# Patient Record
Sex: Female | Born: 1990
Health system: Southern US, Community
[De-identification: ages and names within clinical notes are randomized; demographics above are authoritative.]

## PROBLEM LIST (undated history)

## (undated) DIAGNOSIS — S83419A Sprain of medial collateral ligament of unspecified knee, initial encounter: Secondary | ICD-10-CM

## (undated) DIAGNOSIS — O139 Gestational [pregnancy-induced] hypertension without significant proteinuria, unspecified trimester: Secondary | ICD-10-CM

## (undated) DIAGNOSIS — W57XXXA Bitten or stung by nonvenomous insect and other nonvenomous arthropods, initial encounter: Secondary | ICD-10-CM

## (undated) DIAGNOSIS — K219 Gastro-esophageal reflux disease without esophagitis: Secondary | ICD-10-CM

## (undated) DIAGNOSIS — T7840XA Allergy, unspecified, initial encounter: Secondary | ICD-10-CM

## (undated) DIAGNOSIS — M2550 Pain in unspecified joint: Secondary | ICD-10-CM

## (undated) DIAGNOSIS — Q796 Ehlers-Danlos syndrome, unspecified: Secondary | ICD-10-CM

## (undated) DIAGNOSIS — E282 Polycystic ovarian syndrome: Secondary | ICD-10-CM

## (undated) DIAGNOSIS — M2341 Loose body in knee, right knee: Secondary | ICD-10-CM

## (undated) DIAGNOSIS — J45909 Unspecified asthma, uncomplicated: Secondary | ICD-10-CM

## (undated) DIAGNOSIS — M549 Dorsalgia, unspecified: Secondary | ICD-10-CM

## (undated) DIAGNOSIS — K829 Disease of gallbladder, unspecified: Secondary | ICD-10-CM

## (undated) DIAGNOSIS — R0602 Shortness of breath: Secondary | ICD-10-CM

## (undated) DIAGNOSIS — J411 Mucopurulent chronic bronchitis: Secondary | ICD-10-CM

## (undated) DIAGNOSIS — E739 Lactose intolerance, unspecified: Secondary | ICD-10-CM

## (undated) DIAGNOSIS — H6693 Otitis media, unspecified, bilateral: Secondary | ICD-10-CM

## (undated) DIAGNOSIS — K589 Irritable bowel syndrome without diarrhea: Secondary | ICD-10-CM

## (undated) HISTORY — DX: Gastro-esophageal reflux disease without esophagitis: K21.9

## (undated) HISTORY — DX: Ehlers-Danlos syndrome, unspecified: Q79.60

## (undated) HISTORY — DX: Allergy, unspecified, initial encounter: T78.40XA

## (undated) HISTORY — DX: Lactose intolerance, unspecified: E73.9

## (undated) HISTORY — DX: Pain in unspecified joint: M25.50

## (undated) HISTORY — DX: Disease of gallbladder, unspecified: K82.9

## (undated) HISTORY — DX: Shortness of breath: R06.02

## (undated) HISTORY — DX: Polycystic ovarian syndrome: E28.2

## (undated) HISTORY — DX: Dorsalgia, unspecified: M54.9

## (undated) HISTORY — DX: Irritable bowel syndrome, unspecified: K58.9

## (undated) HISTORY — PX: TYMPANOSTOMY TUBE PLACEMENT: SHX32

## (undated) HISTORY — DX: Unspecified asthma, uncomplicated: J45.909

---
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
## 2011-02-03 ENCOUNTER — Inpatient Hospital Stay (INDEPENDENT_AMBULATORY_CARE_PROVIDER_SITE_OTHER)
Admission: RE | Admit: 2011-02-03 | Discharge: 2011-02-03 | Disposition: A | Discharge status: Home / Self Care | Payer: 59 | Source: Ambulatory Visit | Attending: Emergency Medicine | Admitting: Emergency Medicine

## 2011-02-03 DIAGNOSIS — K219 Gastro-esophageal reflux disease without esophagitis: Secondary | ICD-10-CM

## 2011-03-18 ENCOUNTER — Encounter (INDEPENDENT_AMBULATORY_CARE_PROVIDER_SITE_OTHER): Payer: Self-pay

## 2011-04-03 ENCOUNTER — Emergency Department (HOSPITAL_COMMUNITY): Payer: 59

## 2011-04-03 ENCOUNTER — Inpatient Hospital Stay (INDEPENDENT_AMBULATORY_CARE_PROVIDER_SITE_OTHER)
Admission: RE | Admit: 2011-04-03 | Discharge: 2011-04-03 | Disposition: A | Discharge status: Home / Self Care | Payer: 59 | Source: Ambulatory Visit | Attending: Family Medicine | Admitting: Family Medicine

## 2011-04-03 ENCOUNTER — Emergency Department (HOSPITAL_COMMUNITY)
Admission: EM | Admit: 2011-04-03 | Discharge: 2011-04-04 | Disposition: A | Discharge status: Home / Self Care | Payer: 59 | Attending: Emergency Medicine | Admitting: Emergency Medicine

## 2011-04-03 ENCOUNTER — Ambulatory Visit (INDEPENDENT_AMBULATORY_CARE_PROVIDER_SITE_OTHER): Payer: 59

## 2011-04-03 DIAGNOSIS — IMO0002 Reserved for concepts with insufficient information to code with codable children: Secondary | ICD-10-CM | POA: Insufficient documentation

## 2011-04-03 DIAGNOSIS — Y92410 Unspecified street and highway as the place of occurrence of the external cause: Secondary | ICD-10-CM | POA: Insufficient documentation

## 2011-04-03 DIAGNOSIS — Z79899 Other long term (current) drug therapy: Secondary | ICD-10-CM | POA: Insufficient documentation

## 2011-04-03 DIAGNOSIS — S7000XA Contusion of unspecified hip, initial encounter: Secondary | ICD-10-CM | POA: Insufficient documentation

## 2011-04-03 DIAGNOSIS — K219 Gastro-esophageal reflux disease without esophagitis: Secondary | ICD-10-CM | POA: Insufficient documentation

## 2011-04-03 DIAGNOSIS — J029 Acute pharyngitis, unspecified: Secondary | ICD-10-CM

## 2011-04-03 DIAGNOSIS — M25559 Pain in unspecified hip: Secondary | ICD-10-CM | POA: Insufficient documentation

## 2011-04-03 DIAGNOSIS — M79609 Pain in unspecified limb: Secondary | ICD-10-CM | POA: Insufficient documentation

## 2011-04-03 LAB — URINALYSIS, ROUTINE W REFLEX MICROSCOPIC
Bilirubin Urine: NEGATIVE
Glucose, UA: NEGATIVE mg/dL
Ketones, ur: 15 mg/dL — AB
Leukocytes, UA: NEGATIVE
Nitrite: NEGATIVE
Protein, ur: NEGATIVE mg/dL
Specific Gravity, Urine: 1.008 (ref 1.005–1.030)
Urobilinogen, UA: 0.2 mg/dL (ref 0.0–1.0)
pH: 6.5 (ref 5.0–8.0)

## 2011-04-03 LAB — URINE MICROSCOPIC-ADD ON

## 2011-04-03 LAB — POCT PREGNANCY, URINE: Preg Test, Ur: NEGATIVE

## 2011-04-08 ENCOUNTER — Ambulatory Visit (INDEPENDENT_AMBULATORY_CARE_PROVIDER_SITE_OTHER): Payer: 59 | Admitting: Internal Medicine

## 2011-04-10 ENCOUNTER — Ambulatory Visit (INDEPENDENT_AMBULATORY_CARE_PROVIDER_SITE_OTHER): Payer: 59 | Admitting: Internal Medicine

## 2011-04-10 ENCOUNTER — Encounter (INDEPENDENT_AMBULATORY_CARE_PROVIDER_SITE_OTHER): Payer: Self-pay | Admitting: Internal Medicine

## 2011-04-10 ENCOUNTER — Telehealth (INDEPENDENT_AMBULATORY_CARE_PROVIDER_SITE_OTHER): Payer: Self-pay | Admitting: *Deleted

## 2011-04-10 VITALS — BP 104/68 | HR 72 | Temp 98.0°F | Ht 70.0 in | Wt 148.0 lb

## 2011-04-10 DIAGNOSIS — K219 Gastro-esophageal reflux disease without esophagitis: Secondary | ICD-10-CM

## 2011-04-10 NOTE — Progress Notes (Signed)
Subjective:     Patient ID: Tammy Chapman, female   DOB: 04/29/1991, 20 y.o.   MRN: [id number]  HPI   Referral from Dr. Margo Aye  For acid reflux.  She says her stomach does not like spicy foods or greasy foods.  Even bland foods will bother her.  She has has epigastric pain and chest pain since June. She  saw Dr. Margo Aye in June and was given an Rx for  Carafate and Dexilant which has helped somewhat.   She continues to have symptoms.  Symptoms have been worse since her gallbladder was removed 3 yrs ago.  She underwent cholecystectomy for biliary dyskinesia by Dr. Carolynne Edouard III. Her  appetite is good.  No weight loss. No dysphagia. She has frequent diarrhea since her gallbladder surgery.   Sometimes after she eats, she will have to have a BM. No rectal bleeding or melena.   Father has Celiac disease.  LMP 03/22/2011. Not sexual active  Past Surgical History  Procedure Date  . Cholecystectomy 01/20/08    PAUL TOTH  . Myringotomy     Tubes in ears   Past Medical History  Diagnosis Date  . GERD (gastroesophageal reflux disease)   . Chest pain   . Dizziness   . Anxiety    Current outpatient prescriptions:dexlansoprazole (DEXILANT) 60 MG capsule, Take 60 mg by mouth daily.  , Disp: , Rfl: ;  sucralfate (CARAFATE) 1 GM/10ML suspension, Take 1 g by mouth 4 (four) times daily.  , Disp: , Rfl:   Family HIstory: Mother and father alive in good health. Father has Celiac disease. One brother in good health. 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 She does not smoke, drink, or do drugs.  .    Review of Systems see hpi     Objective:   Physical Exam     Blood pressure 104/68, pulse 72, temperature 98 F (36.7 C), height 5\' 10"  (1.778 m), weight 148 lb (67.132 kg), last menstrual period 03/22/2011.  Alert and oriented. Skin warm and dry. Oral mucosa is moist. Natural teeth in good condition. Sclera anicteric, conjunctivae is pink. Thyroid not enlarged. No  cervical lymphadenopathy. Lungs clear. Heart regular rate and rhythm.  Abdomen is soft. Bowel sounds are positive. No hepatomegaly. No abdominal masses felt. No tenderness.  No edema to lower extremities. Patient is alert and oriented.     Assessment:     Susa Loffler to [employer] and Carafate.  PUD/ esophagitis needs to be ruled out.    Plan:    EGD..  Mother would also like her daughter to be test for Celiac. The risks and benefits such as perforation, bleeding, and infection were reviewed with the patient and is agreeable.

## 2011-04-10 NOTE — Telephone Encounter (Signed)
EGD sch'd 05/15/11 @ 3:15 (2:15), instructions given to pt

## 2011-05-14 LAB — CBC
HCT: 41.4
Hemoglobin: 14.1
MCHC: 34.2
MCV: 84
Platelets: 218
RBC: 4.92
RDW: 13
WBC: 7.1

## 2011-05-14 MED ORDER — SODIUM CHLORIDE 0.45 % IV SOLN
Freq: Once | INTRAVENOUS | Status: AC
  Administered 2011-05-15: 14:00:00 via INTRAVENOUS

## 2011-05-15 ENCOUNTER — Ambulatory Visit (HOSPITAL_COMMUNITY)
Admission: RE | Admit: 2011-05-15 | Discharge: 2011-05-15 | Disposition: A | Discharge status: Home / Self Care | Payer: 59 | Source: Ambulatory Visit | Attending: Internal Medicine | Admitting: Internal Medicine

## 2011-05-15 ENCOUNTER — Encounter (HOSPITAL_COMMUNITY)
Admission: RE | Disposition: A | Discharge status: Home / Self Care | Payer: Self-pay | Source: Ambulatory Visit | Attending: Internal Medicine

## 2011-05-15 ENCOUNTER — Other Ambulatory Visit (INDEPENDENT_AMBULATORY_CARE_PROVIDER_SITE_OTHER): Payer: Self-pay | Admitting: Internal Medicine

## 2011-05-15 ENCOUNTER — Encounter (HOSPITAL_COMMUNITY): Payer: Self-pay

## 2011-05-15 DIAGNOSIS — R1013 Epigastric pain: Secondary | ICD-10-CM | POA: Insufficient documentation

## 2011-05-15 DIAGNOSIS — K2940 Chronic atrophic gastritis without bleeding: Secondary | ICD-10-CM | POA: Insufficient documentation

## 2011-05-15 DIAGNOSIS — K2990 Gastroduodenitis, unspecified, without bleeding: Secondary | ICD-10-CM

## 2011-05-15 DIAGNOSIS — K2970 Gastritis, unspecified, without bleeding: Secondary | ICD-10-CM

## 2011-05-15 DIAGNOSIS — K219 Gastro-esophageal reflux disease without esophagitis: Secondary | ICD-10-CM

## 2011-05-15 HISTORY — PX: ESOPHAGOGASTRODUODENOSCOPY: SHX5428

## 2011-05-15 SURGERY — EGD (ESOPHAGOGASTRODUODENOSCOPY)
Anesthesia: Moderate Sedation

## 2011-05-15 MED ORDER — MEPERIDINE HCL 25 MG/ML IJ SOLN
INTRAMUSCULAR | Status: DC | PRN
  Administered 2011-05-15 (×2): 25 mg via INTRAVENOUS

## 2011-05-15 MED ORDER — DICYCLOMINE HCL 10 MG PO CAPS: 10.0000 mg | ORAL_CAPSULE | Freq: Two times a day (BID) | ORAL | Status: DC

## 2011-05-15 MED ORDER — MIDAZOLAM HCL 5 MG/5ML IJ SOLN
INTRAMUSCULAR | Status: DC | PRN
  Administered 2011-05-15: 1 mg via INTRAVENOUS
  Administered 2011-05-15 (×4): 2 mg via INTRAVENOUS

## 2011-05-15 MED ORDER — MIDAZOLAM HCL 5 MG/5ML IJ SOLN
INTRAMUSCULAR | Status: AC
  Filled 2011-05-15: qty 10

## 2011-05-15 MED ORDER — MEPERIDINE HCL 50 MG/ML IJ SOLN
INTRAMUSCULAR | Status: AC
  Filled 2011-05-15: qty 1

## 2011-05-15 MED ORDER — BUTAMBEN-TETRACAINE-BENZOCAINE 2-2-14 % EX AERO
INHALATION_SPRAY | CUTANEOUS | Status: DC | PRN
  Administered 2011-05-15: 2 via TOPICAL

## 2011-05-15 NOTE — H&P (Signed)
Tammy Chapman is an 20 y.o. female.   Chief Complaint: Patient is here for diagnostic esophagogastroduodenoscopy HPI: Patient is 21 year old female was at symptoms of GERD for 18-24 months uncontrolled current therapy continues to experience epigastric pain after some meals. She has found that she is intolerant of diary products and using lactated has not alleviated her symptoms. She has intermittent diarrhea. Her mother is concerned that she may have celiac disease because her biologic father had this condition. Tammy Chapman states that this is different pain that she experienced prior to having her gallbladder removed in June 2009. No history of melena rectal bleeding or weight loss. She does not use OTC NSAIDs. She also denies dysphagia  Past Medical History  Diagnosis Date  . GERD (gastroesophageal reflux disease)   . Chest pain   . Dizziness   . Anxiety     Past Surgical History  Procedure Date  . Cholecystectomy 01/20/08    PAUL TOTH  . Myringotomy     Tubes in ears  . Tonsillectomy     age 55    History reviewed. No pertinent family history. Social History:  reports that she has never smoked. She does not have any smokeless tobacco history on file. She reports that she does not drink alcohol or use illicit drugs.  Allergies:  Allergies  Allergen Reactions  . Benadryl (Altaryl) Shortness Of Breath  . Penicillins Shortness Of Breath and Rash  . Ultram (Tramadol Hcl) Hives  . Sulfa Antibiotics Rash    Medications Prior to Admission  Medication Dose Route Frequency Provider Last Rate Last Dose  . 0.45 % sodium chloride infusion   Intravenous Once Malissa Hippo, MD 20 mL/hr at 05/15/11 1429    . meperidine (DEMEROL) 50 MG/ML injection           . midazolam (VERSED) 5 MG/5ML injection            Medications Prior to Admission  Medication Sig Dispense Refill  . acetaminophen (TYLENOL) 500 MG tablet Take 1,000 mg by mouth every 6 (six) hours as needed. For pain       .  cetirizine (ZYRTEC) 10 MG tablet Take 10 mg by mouth daily.        Marland Kitchen dexlansoprazole (DEXILANT) 60 MG capsule Take 60 mg by mouth daily.        . sucralfate (CARAFATE) 1 GM/10ML suspension Take 1 g by mouth 4 (four) times daily.          No results found for this or any previous visit (from the past 48 hour(s)). No results found.  Review of Systems  Constitutional: Negative for weight loss.  Gastrointestinal: Negative for nausea, vomiting, constipation, blood in stool and melena. Heartburn:  well controlled with with dexilant; Nexium did not provide any relief. Abdominal pain: Intermittent postprandial epigastric pain. Diarrhea: occasional.    Blood pressure 123/88, pulse 102, temperature 98.2 F (36.8 C), temperature source Oral, resp. rate 15, height 5\' 10"  (1.778 m), weight 140 lb (63.504 kg), last menstrual period 05/15/2011, SpO2 100.00%. Physical Exam  Constitutional: She appears well-developed and well-nourished.  HENT:  Mouth/Throat: Oropharynx is clear and moist.  Eyes: Conjunctivae are normal.  Neck: No thyromegaly present.  Cardiovascular: Normal rate, regular rhythm and normal heart sounds.   No murmur heard. Respiratory: Effort normal and breath sounds normal.  GI: Soft. Bowel sounds are normal. She exhibits no distension and no mass. There is no tenderness.  Musculoskeletal: She exhibits no edema.  Lymphadenopathy:  She has no cervical adenopathy.  Neurological: She is alert.  Skin: Skin is warm and dry.     Assessment/Plan Recurrent epigastric pain not responding to PPI therapy. Patient is status post cholecystectomy were 2 years ago Family history of celiac disease Diagnostic esophagogastroduodenoscopy  Kvon Mcilhenny U 05/15/2011, 3:04 PM

## 2011-05-15 NOTE — Op Note (Signed)
EGD PROCEDURE REPORT  PATIENT:  Tammy Chapman  MR#:  [id number] Birthdate:  1991-01-08, 20 y.o., female Endoscopist:  Dr. Malissa Hippo, MD Referred By:  Dr. Dwana Melena, MD Procedure Date: 05/15/2011  Procedure:   EGD  Indications:  Patient is a 20 year old Caucasian female with a symptoms of GERD for 18-24 months now doing well with therapy she continues to experience postprandial epigastric pain. She is status post cholecystectomy over 2 years ago. He has intermittent diarrhea family history is positive for celiac disease in her father. She is undergoing diagnostic EGD to            Informed Consent: Procedure and risks were reviewed with the patient and informed consent was obtained. Medications:  Demerol 50 mg IV Versed 9 mg IV Cetacaine spray topically for oropharyngeal anesthesia  Description of procedure:  The endoscope was introduced through the mouth and advanced to the second portion of the duodenum without difficulty or limitations. The mucosal surfaces were surveyed very carefully during advancement of the scope and upon withdrawal.  Findings:  Esophagus:  Mucosa of the esophagus was normal. GEJ:  at 40 cm without ring or stricture. No hiatal hernia present Stomach:  Stomach was empty other than small amount of bile. There was patchy edema and erythema to  antral mucosa. No erosions or ulcers were identified. Angularis fundus and cardia were normal. Pyloric channel was patent. Antral biopsy taken for routine histology. Duodenum:  Normal bulbar and post bulbar mucosa. Random biopsies taken from second part of the duodenum mucosa looking for mucosal disease.  Therapeutic/Diagnostic Maneuvers Performed:  See above  Complications:  None  Impression: Nonerosive antral gastritis otherwise normal esophagogastroduodenoscopy. Biopsy taken from antral and duodenal mucosa  Recommendations:  Continue anti-reflex measures and  Dexilant at current dose. May discontinue  sucralfate. Dicyclomine 10 mg before breakfast and lunch by mouth. I will be contacting patient with results of biopsy and further recommendations   Natividad Halls U  05/15/2011  3:41 PM  CC: Dr. Dwana Melena, MD & Dr. Bonnetta Barry ref. provider found

## 2011-05-22 ENCOUNTER — Encounter (HOSPITAL_COMMUNITY): Payer: Self-pay | Admitting: Internal Medicine

## 2011-05-28 ENCOUNTER — Encounter (INDEPENDENT_AMBULATORY_CARE_PROVIDER_SITE_OTHER): Payer: Self-pay | Admitting: *Deleted

## 2011-08-04 ENCOUNTER — Encounter (INDEPENDENT_AMBULATORY_CARE_PROVIDER_SITE_OTHER): Payer: Self-pay | Admitting: Internal Medicine

## 2011-08-04 ENCOUNTER — Ambulatory Visit (INDEPENDENT_AMBULATORY_CARE_PROVIDER_SITE_OTHER): Payer: 59 | Admitting: Internal Medicine

## 2011-08-04 DIAGNOSIS — R109 Unspecified abdominal pain: Secondary | ICD-10-CM | POA: Insufficient documentation

## 2011-08-04 DIAGNOSIS — K219 Gastro-esophageal reflux disease without esophagitis: Secondary | ICD-10-CM

## 2011-08-04 DIAGNOSIS — R197 Diarrhea, unspecified: Secondary | ICD-10-CM

## 2011-08-04 DIAGNOSIS — K580 Irritable bowel syndrome with diarrhea: Secondary | ICD-10-CM | POA: Insufficient documentation

## 2011-08-04 MED ORDER — DICYCLOMINE HCL 10 MG PO CAPS: 20.0000 mg | ORAL_CAPSULE | Freq: Two times a day (BID) | ORAL | Status: DC

## 2011-08-04 NOTE — Progress Notes (Signed)
Presenting complaint; Follow-up for GERD, abdominal pain and diarrhea. Subjective: Tammy Chapman is a 20 year old Caucasian female who is here for scheduled visit accompanied by her mother Tammy Chapman. She does not feel 100% better. She has been on Dexilant, she has rarely experienced heartburn. With dicyclomine she has noted reduction in diarrhea episodes and less pain she still has some days when she had excruciating epigastric pain after meals at times she has pain across the lower abdomen. She also has noted recent frequency of vomiting. Last night she ate ice cream and experienced severe abdominal pain. Only side effect she is experienced with dicyclomine is dry eyes and she is able to take care of it by using eyedrops. Patient states her GI symptoms began after a visit to [place] when she was 20 years old but she got better and then symptoms relapse 2 years later. Cholecystectomy in June 2009 did not provide any relief. In 2010 she hadn't abdominopelvic CT when she had possibly ruptured ovarian cyst and no abnormality noted the pancreas or intestines. She had EGD on 05/15/2011 which revealed nonerosive antral gastritis. Gastric biopsy revealed reactive changes and some hyperemia but no evidence of H. pylori and due to the biopsy was negative for changes of celiac disease. She remains with good appetite but at times afraid to eat. He denies skin rash, wheezing, melena or rectal bleeding. She has gained 8 pounds in the last 4 months. Current Medications: Current Outpatient Prescriptions  Medication Sig Dispense Refill  . acetaminophen (TYLENOL) 500 MG tablet Take 1,000 mg by mouth every 6 (six) hours as needed. For pain       . cetirizine (ZYRTEC) 10 MG tablet Take 10 mg by mouth daily.        Marland Kitchen dexlansoprazole (DEXILANT) 60 MG capsule Take 60 mg by mouth daily.        Marland Kitchen dicyclomine (BENTYL) 10 MG capsule Take 1 capsule (10 mg total) by mouth 2 (two) times daily with breakfast and lunch.  60 capsule  3     Objective: BP 102/70  Pulse 76  Temp(Src) 98.1 F (36.7 C) (Oral)  Resp 14  Ht 5\' 10"  (1.778 m)  Wt 156 lb (70.761 kg)  BMI 22.38 kg/m2  LMP 07/19/2011  Conjunctiva is pink. Sclera is nonicteric Oral pharyngeal mucosa is normal. No neck masses or thyromegaly noted. Cardiac exam with regular rhythm normal S1 and S2. No murmur or gallop noted. Lungs are clear to auscultation. Abdomen with normal bowel sounds; it is soft and nontender without organomegaly or masses. No LE edema or clubbing noted.  Assessment: #1. Chronic GERD. She is doing well with therapy. .At some point Dexilant  dose will have to be reduced but not until all of her symptoms have resolved. #2. Abdominal pain and diarrhea. Most of her pain is postprandial and located in epigastric region but at times she has lower abdominal pain associated with nonbloody. these symptoms appear to be due to IBS but she has not responded well to therapy. Therefore we'll proceed with further workup.   Plan: Increase dicyclomine to 20 mg before breakfast and 20 mg before lunch or evening meal. She can drop second dose to 10 mg if she has any side effects. CBC with differential and comprehensive chemistry panel. Small bowel follow-through. Office visit in 3 months.

## 2011-08-04 NOTE — Patient Instructions (Signed)
Milligrams 30 minutes before breakfast and 20 mg before lunch or evening meal; can reduce second dose to 10 mg if you have dry mouth or dry eyes. Small bowel follow-through to be scheduled. Physician will contact you with results of blood work and small bowel x-ray

## 2011-08-05 LAB — COMPREHENSIVE METABOLIC PANEL
ALT: 14 U/L (ref 0–35)
AST: 11 U/L (ref 0–37)
Albumin: 4.3 g/dL (ref 3.5–5.2)
Alkaline Phosphatase: 66 U/L (ref 39–117)
BUN: 10 mg/dL (ref 6–23)
CO2: 26 mEq/L (ref 19–32)
Calcium: 9.2 mg/dL (ref 8.4–10.5)
Chloride: 104 mEq/L (ref 96–112)
Creat: 0.66 mg/dL (ref 0.50–1.10)
Glucose, Bld: 95 mg/dL (ref 70–99)
Potassium: 3.9 mEq/L (ref 3.5–5.3)
Sodium: 135 mEq/L (ref 135–145)
Total Bilirubin: 1.1 mg/dL (ref 0.3–1.2)
Total Protein: 6.9 g/dL (ref 6.0–8.3)

## 2011-08-06 LAB — CBC WITH DIFFERENTIAL/PLATELET
Basophils Absolute: 0 10*3/uL (ref 0.0–0.1)
Basophils Relative: 0 % (ref 0–1)
Eosinophils Absolute: 0.1 10*3/uL (ref 0.0–0.7)
Eosinophils Relative: 2 % (ref 0–5)
HCT: 43 % (ref 36.0–46.0)
Hemoglobin: 14.3 g/dL (ref 12.0–15.0)
Lymphocytes Relative: 48 % — ABNORMAL HIGH (ref 12–46)
Lymphs Abs: 2.7 10*3/uL (ref 0.7–4.0)
MCH: 28.8 pg (ref 26.0–34.0)
MCHC: 33.3 g/dL (ref 30.0–36.0)
MCV: 86.5 fL (ref 78.0–100.0)
Monocytes Absolute: 0.6 10*3/uL (ref 0.1–1.0)
Monocytes Relative: 11 % (ref 3–12)
Neutro Abs: 2.2 10*3/uL (ref 1.7–7.7)
Neutrophils Relative %: 39 % — ABNORMAL LOW (ref 43–77)
Platelets: 190 10*3/uL (ref 150–400)
RBC: 4.97 MIL/uL (ref 3.87–5.11)
RDW: 12.8 % (ref 11.5–15.5)
WBC: 5.6 10*3/uL (ref 4.0–10.5)

## 2011-08-07 ENCOUNTER — Ambulatory Visit (HOSPITAL_COMMUNITY)
Admission: RE | Admit: 2011-08-07 | Discharge: 2011-08-07 | Disposition: A | Discharge status: Home / Self Care | Payer: 59 | Source: Ambulatory Visit | Attending: Internal Medicine | Admitting: Internal Medicine

## 2011-08-07 DIAGNOSIS — R109 Unspecified abdominal pain: Secondary | ICD-10-CM | POA: Insufficient documentation

## 2011-08-07 DIAGNOSIS — R112 Nausea with vomiting, unspecified: Secondary | ICD-10-CM | POA: Insufficient documentation

## 2011-08-07 DIAGNOSIS — R197 Diarrhea, unspecified: Secondary | ICD-10-CM | POA: Insufficient documentation

## 2011-08-13 ENCOUNTER — Telehealth (INDEPENDENT_AMBULATORY_CARE_PROVIDER_SITE_OTHER): Payer: Self-pay | Admitting: *Deleted

## 2011-08-13 DIAGNOSIS — R197 Diarrhea, unspecified: Secondary | ICD-10-CM

## 2011-08-13 DIAGNOSIS — R109 Unspecified abdominal pain: Secondary | ICD-10-CM

## 2011-08-13 NOTE — Telephone Encounter (Signed)
Per Dr. Karilyn Cota the patient will need CBC/Diff in 1 month with a smear by Pathologist.

## 2011-08-14 ENCOUNTER — Encounter (INDEPENDENT_AMBULATORY_CARE_PROVIDER_SITE_OTHER): Payer: Self-pay | Admitting: *Deleted

## 2011-08-20 ENCOUNTER — Telehealth (INDEPENDENT_AMBULATORY_CARE_PROVIDER_SITE_OTHER): Payer: Self-pay | Admitting: *Deleted

## 2011-08-20 DIAGNOSIS — D649 Anemia, unspecified: Secondary | ICD-10-CM

## 2011-08-20 NOTE — Telephone Encounter (Signed)
Smear by pathologist

## 2011-09-02 ENCOUNTER — Other Ambulatory Visit (INDEPENDENT_AMBULATORY_CARE_PROVIDER_SITE_OTHER): Payer: Self-pay | Admitting: Internal Medicine

## 2011-09-03 LAB — CBC WITH DIFFERENTIAL/PLATELET
Basophils Absolute: 0 10*3/uL (ref 0.0–0.1)
Basophils Relative: 0 % (ref 0–1)
Eosinophils Absolute: 0.1 10*3/uL (ref 0.0–0.7)
Eosinophils Relative: 2 % (ref 0–5)
HCT: 46.5 % — ABNORMAL HIGH (ref 36.0–46.0)
Hemoglobin: 15.5 g/dL — ABNORMAL HIGH (ref 12.0–15.0)
Lymphocytes Relative: 49 % — ABNORMAL HIGH (ref 12–46)
Lymphs Abs: 3 10*3/uL (ref 0.7–4.0)
MCH: 28.6 pg (ref 26.0–34.0)
MCHC: 33.3 g/dL (ref 30.0–36.0)
MCV: 85.8 fL (ref 78.0–100.0)
Monocytes Absolute: 0.5 10*3/uL (ref 0.1–1.0)
Monocytes Relative: 8 % (ref 3–12)
Neutro Abs: 2.5 10*3/uL (ref 1.7–7.7)
Neutrophils Relative %: 41 % — ABNORMAL LOW (ref 43–77)
Platelets: 216 10*3/uL (ref 150–400)
RBC: 5.42 MIL/uL — ABNORMAL HIGH (ref 3.87–5.11)
RDW: 12.9 % (ref 11.5–15.5)
WBC: 6.2 10*3/uL (ref 4.0–10.5)

## 2011-09-03 LAB — PATHOLOGIST SMEAR REVIEW

## 2011-09-10 ENCOUNTER — Telehealth (INDEPENDENT_AMBULATORY_CARE_PROVIDER_SITE_OTHER): Payer: Self-pay | Admitting: *Deleted

## 2011-09-10 DIAGNOSIS — R197 Diarrhea, unspecified: Secondary | ICD-10-CM

## 2011-09-10 DIAGNOSIS — K219 Gastro-esophageal reflux disease without esophagitis: Secondary | ICD-10-CM

## 2011-09-10 DIAGNOSIS — R109 Unspecified abdominal pain: Secondary | ICD-10-CM

## 2011-09-10 NOTE — Telephone Encounter (Signed)
Per NUR the patient will need repeat CBC/D in 3 months, lab is noted.

## 2011-11-07 ENCOUNTER — Encounter (INDEPENDENT_AMBULATORY_CARE_PROVIDER_SITE_OTHER): Payer: Self-pay | Admitting: *Deleted

## 2011-11-11 ENCOUNTER — Ambulatory Visit (INDEPENDENT_AMBULATORY_CARE_PROVIDER_SITE_OTHER): Payer: 59 | Admitting: Internal Medicine

## 2011-11-28 ENCOUNTER — Other Ambulatory Visit (INDEPENDENT_AMBULATORY_CARE_PROVIDER_SITE_OTHER): Payer: Self-pay | Admitting: Internal Medicine

## 2011-11-28 LAB — CBC WITH DIFFERENTIAL/PLATELET
Basophils Absolute: 0 10*3/uL (ref 0.0–0.1)
Basophils Relative: 1 % (ref 0–1)
Eosinophils Absolute: 0.2 10*3/uL (ref 0.0–0.7)
Eosinophils Relative: 3 % (ref 0–5)
HCT: 43.8 % (ref 36.0–46.0)
Hemoglobin: 13.9 g/dL (ref 12.0–15.0)
Lymphocytes Relative: 36 % (ref 12–46)
Lymphs Abs: 2.1 10*3/uL (ref 0.7–4.0)
MCH: 28.4 pg (ref 26.0–34.0)
MCHC: 31.7 g/dL (ref 30.0–36.0)
MCV: 89.6 fL (ref 78.0–100.0)
Monocytes Absolute: 0.6 10*3/uL (ref 0.1–1.0)
Monocytes Relative: 10 % (ref 3–12)
Neutro Abs: 3.1 10*3/uL (ref 1.7–7.7)
Neutrophils Relative %: 51 % (ref 43–77)
Platelets: 200 10*3/uL (ref 150–400)
RBC: 4.89 MIL/uL (ref 3.87–5.11)
RDW: 13.6 % (ref 11.5–15.5)
WBC: 6 10*3/uL (ref 4.0–10.5)

## 2011-12-09 ENCOUNTER — Ambulatory Visit (INDEPENDENT_AMBULATORY_CARE_PROVIDER_SITE_OTHER): Payer: 59 | Admitting: Internal Medicine

## 2012-01-02 ENCOUNTER — Ambulatory Visit (INDEPENDENT_AMBULATORY_CARE_PROVIDER_SITE_OTHER): Payer: 59 | Admitting: Emergency Medicine

## 2012-01-02 DIAGNOSIS — Z23 Encounter for immunization: Secondary | ICD-10-CM

## 2012-01-02 NOTE — Progress Notes (Signed)
   Patient ID: Tammy Chapman MRN: [id number], DOB: 07/16/1991, 20 y.o. Date of Encounter: 01/02/2012, 8:22 AM  Primary Physician: Dwana Melena, MD, MD  Chief Complaint: Here for Depo-Provera injection  21 y.o. year old female here for hepatitis B vaciination. Ok to give hepatitis B injection. Next injection due in 4 months. This was a nursing only encounter. No provider/patient encounter occurred today.   Signed, Eula Listen, PA-C 01/02/2012 8:22 AM

## 2012-03-29 ENCOUNTER — Ambulatory Visit (HOSPITAL_COMMUNITY)
Admission: RE | Admit: 2012-03-29 | Discharge: 2012-03-29 | Disposition: A | Discharge status: Home / Self Care | Payer: 59 | Source: Ambulatory Visit | Attending: Orthopedic Surgery | Admitting: Orthopedic Surgery

## 2012-03-29 ENCOUNTER — Other Ambulatory Visit (HOSPITAL_COMMUNITY): Payer: Self-pay | Admitting: Orthopedic Surgery

## 2012-03-29 DIAGNOSIS — S99929A Unspecified injury of unspecified foot, initial encounter: Secondary | ICD-10-CM | POA: Insufficient documentation

## 2012-03-29 DIAGNOSIS — W19XXXA Unspecified fall, initial encounter: Secondary | ICD-10-CM | POA: Insufficient documentation

## 2012-03-29 DIAGNOSIS — M25569 Pain in unspecified knee: Secondary | ICD-10-CM | POA: Insufficient documentation

## 2012-03-29 DIAGNOSIS — S8990XA Unspecified injury of unspecified lower leg, initial encounter: Secondary | ICD-10-CM | POA: Insufficient documentation

## 2012-03-30 ENCOUNTER — Other Ambulatory Visit (HOSPITAL_COMMUNITY): Payer: 59

## 2012-04-08 ENCOUNTER — Ambulatory Visit (HOSPITAL_COMMUNITY)
Admission: RE | Admit: 2012-04-08 | Discharge: 2012-04-08 | Disposition: A | Discharge status: Home / Self Care | Payer: 59 | Source: Ambulatory Visit | Attending: Orthopedic Surgery | Admitting: Orthopedic Surgery

## 2012-04-08 DIAGNOSIS — M25569 Pain in unspecified knee: Secondary | ICD-10-CM | POA: Insufficient documentation

## 2012-04-08 DIAGNOSIS — R262 Difficulty in walking, not elsewhere classified: Secondary | ICD-10-CM | POA: Insufficient documentation

## 2012-04-08 DIAGNOSIS — R269 Unspecified abnormalities of gait and mobility: Secondary | ICD-10-CM | POA: Insufficient documentation

## 2012-04-08 DIAGNOSIS — IMO0001 Reserved for inherently not codable concepts without codable children: Secondary | ICD-10-CM | POA: Insufficient documentation

## 2012-04-08 DIAGNOSIS — M25669 Stiffness of unspecified knee, not elsewhere classified: Secondary | ICD-10-CM | POA: Insufficient documentation

## 2012-04-08 DIAGNOSIS — M6281 Muscle weakness (generalized): Secondary | ICD-10-CM | POA: Insufficient documentation

## 2012-04-08 NOTE — Evaluation (Signed)
Physical Therapy Evaluation  Patient Details  Name: Tammy Chapman MRN: [id number] Date of Birth: [birth year]  Today's Date: 04/08/2012 Time: 0850-0955 PT Time Calculation (min): 65 min Charges: 1 eval, 10 min gait, 1 estim Visit#: 1  of 12   Re-eval: 05/08/12 Assessment Diagnosis: R patellar discolation Next MD Visit: Dr. Thurston Hole - Sept 12  Past Medical History:  Past Medical History  Diagnosis Date  . GERD (gastroesophageal reflux disease)   . Chest pain   . Dizziness   . Anxiety    Past Surgical History:  Past Surgical History  Procedure Date  . Cholecystectomy 01/20/08    PAUL TOTH  . Myringotomy     Tubes in ears  . Tonsillectomy     age 49  . Esophagogastroduodenoscopy 05/15/2011    Procedure: ESOPHAGOGASTRODUODENOSCOPY (EGD);  Surgeon: Malissa Hippo, MD;  Location: [facility];  Service: Endoscopy;  Laterality: N/A;  3:15     Subjective Symptoms/Limitations Symptoms: PMH:  reports generalized cluminess with shoulder subluxations and patellar subluxation 7 years ago.  Therapy for elbow and knee pain. otherwise unremarkable Pertinent History: Pt is referred to PT secondary to R patellar dislocation which happened 2 weeks ago when she tripped she felt a terrible pain in her medial joint line.  She reports she has been propping her knee at night becasue it relieves her pain. Her c/co's are: increased pain with motion, inablity to independently walk, and decreased strength. How long can you stand comfortably?: Has increased pain at the end of the day when she has been working.  How long can you walk comfortably?: NWB w/bilateral axillary crutchs w/knee brace Special Tests: Soft end feel w/valgus stress test and anterior drawer.  + R patellar apprehension test Pain Assessment Currently in Pain?: Yes Pain Score:   5 Pain Location: Knee Pain Orientation: Right Pain Type: Acute pain  Precautions/Restrictions  Precautions Precautions: Other (comment) Precaution  Comments: Knee brace to R knee while WB.  WBAT and progress to independent ambulation quickly  Prior Functioning  Prior Function Vocation Requirements: She is attending school [address] (online course). She works a sporadic schedule with work.  Comments: She enjoys working and her school work.   Cognition/Observation Observation/Other Assessments Observations: 10 degrees SL lag Other Assessments: comes into therapy NWM w/R knee brace donned and B axillary crutches  Sensation/Coordination/Flexibility/Functional Tests Functional Tests Functional Tests: LEFS: 24/80  Assessment RLE AROM (degrees) Right Knee Extension: 3  Right Knee Flexion: 45  RLE PROM (degrees) Right Knee Extension: 0  Right Knee Flexion: 48  RLE Strength Right Hip Flexion: 3-/5 Right Hip Extension: 3/5 Right Hip ABduction: 3/5 Right Hip ADduction: 3-/5 Right Knee Flexion: 3-/5 Right Knee Extension: 3-/5 Palpation Palpation: allodynia to medial knee, pain and tenderness to popliteal fossa., joint lines, fibular head and distal quadricep muscles   Mobility/Balance  Ambulation/Gait Ambulation/Gait: Yes Ambulation/Gait Assistance: 6: Modified independent (Device/Increase time) Assistive device: Crutches Gait Pattern: Step-to pattern;Decreased hip/knee flexion - right;Right foot flat;Decreased weight shift to right;Trunk flexed Posture/Postural Control Posture/Postural Control: Postural limitations Postural Limitations: Trunk flexion with static standing   Exercise/Treatments Standing Gait Training: 10 minutes demonstrating and education on 2 point gait pattern with appropriate gait mechanics w/bilaterall and unilateral axillary crutches as well as crutch fitting. Seated Long Arc Quad: Right;5 reps;AAROM Heel Slides: Right;10 reps;AROM Supine Quad Sets: AROM;5 reps Heel Slides: Right;5 reps Straight Leg Raises: Right;10 reps;Limitations Straight Leg Raises Limitations: 10 degree  lag Sidelying Hip ABduction: Right;10 reps Hip ADduction: Right;10 reps  Prone  Hamstring Curl: 10 reps (AAROM w/LLE) Hip Extension: Right;10 reps Other Prone Exercises: TKE x10  Modalities Modalities: Electrical Stimulation;Cryotherapy Cryotherapy Number Minutes Cryotherapy: 10 Minutes Cryotherapy Location: Knee Type of Cryotherapy: Ice pack Pharmacologist Location: R anterior knee Electrical Stimulation Action: IFES Electrical Stimulation Parameters: x10 minutes w/ice up to 10 volts Electrical Stimulation Goals: Pain  Physical Therapy Assessment and Plan PT Assessment and Plan Clinical Impression Statement: Pt is a 21 year old female referred to PT for R acute patella dislocation. She has significant increase in pain withPROM and is achieving R Knee AROM 3-45 degrees.  She was educated and instructed today on 2 point gait w/bilateral and unilateral axillary crutch(s).  Encouraged to use bilateral until she has appriate gait pattern and move to single crutch.  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  Pt will benefit from skilled therapeutic intervention in order to improve on the following deficits: Abnormal gait;Difficulty walking;Pain;Decreased strength;Decreased range of motion;Decreased coordination;Decreased balance;Decreased knowledge of use of DME Rehab Potential: Good PT Frequency: Min 3X/week (2-3x/week) PT Duration: 6 weeks PT Treatment/Interventions: DME instruction;Gait training;Stair training;Functional mobility training;Therapeutic activities;Therapeutic exercise;Balance training;Neuromuscular re-education;Patient/family education;Manual techniques;Modalities PT Plan: Start with A/PROM exercises and strengthening, gait training and progress quickly to independent ambulation w/appropriate mechanics, continue with general LE strengthening.   Eventually progress to  UE and LE dynamic strengthening activities    Goals Home Exercise Program Pt will Perform Home Exercise Program: Independently PT Goal: Perform Home Exercise Program - Progress: Goal set today PT Short Term Goals Time to Complete Short Term Goals: 3 weeks PT Short Term Goal 1: Pt will report pain less than 5/10 for 50% of her day.  PT Short Term Goal 2: Pt will improve her knee AROM 0-115 degrees.  PT Short Term Goal 3: Pt will improve her LE strength by 1 muscle grade.  PT Short Term Goal 4: Pt will ambulate with mild gait impairments independently. PT Long Term Goals Time to Complete Long Term Goals: Other (comment) (6 weeks) PT Long Term Goal 1: Pt will ambulate indepdently x15 minutes with appropriate gait mechanics in order to return to safe community ambulation.  PT Long Term Goal 2: Pt will improve her dynamic gait and body awareness and demonstrate tandem gait x300 feet on dynamic surface w/o LOB in order to decrease fall risk.  Long Term Goal 3: Pt will improve her LEFS to 50/80 for improved percieved functional ability.  Long Term Goal 4: Pt will improve her R knee AROM 0-125 and improve strength to [facility] in order to ascend and descend 10 stairs independently in order to safely enter community venues.   Problem List Patient Active Problem List  Diagnosis  . GERD (gastroesophageal reflux disease)  . Abdominal pain  . Diarrhea  . Knee pain  . Knee stiffness  . Difficulty in walking  . Abnormality of gait    PT - End of Session Activity Tolerance: Patient limited by pain PT Plan of Care PT Home Exercise Plan: see scanned report.  PT Patient Instructions: Educated pt on importance of HEP and general strength training to reduce risk of joint subluxations and dislocations. Educated on importance of not using the pillow to prop her knee to decrease risk of contracture. Consulted and Agree with Plan of Care: Patient  Annett Fabian, PT 04/08/2012, 10:49 AM  Physician  Documentation Your signature is required to indicate approval of the treatment  plan as stated above.  Please sign and either send electronically or make a copy of this report for your files and return this physician signed original.   Please mark one 1.__approve of plan  2. ___approve of plan with the following conditions.   ______________________________                                                          _____________________ Physician Signature                                                                                                             Date

## 2012-04-13 ENCOUNTER — Ambulatory Visit (HOSPITAL_COMMUNITY)
Admission: RE | Admit: 2012-04-13 | Discharge: 2012-04-13 | Disposition: A | Discharge status: Home / Self Care | Payer: 59 | Source: Ambulatory Visit | Attending: Physical Therapy | Admitting: Physical Therapy

## 2012-04-13 NOTE — Progress Notes (Signed)
Physical Therapy Treatment Patient Details  Name: Tammy Chapman MRN: [id number] Date of Birth: Sep 17, 1990  Today's Date: 04/13/2012 Time: 4098-1191 PT Time Calculation (min): 48 min Visit#: 2  of 12   Re-eval: 05/07/12 Charges:  therex 42'    Subjective: Symptoms/Limitations Symptoms: Pt. entered clinic today without crutches, ambulating with brace on R knee.  No complaints of pain today or subluxations.   Exercise/Treatments Aerobic Stationary Bike: 6'@ 3.0 Standing Heel Raises: 10 reps;Limitations Heel Raises Limitations: Toeraises 10 reps Knee Flexion: 10 reps Lateral Step Up: 10 reps;Step Height: 4";Hand Hold: 1 Forward Step Up: 10 reps;Step Height: 4";Hand Hold: 1 Step Down: 10 reps;Step Height: 4";Hand Hold: 1 SLS with Vectors: 5X5" holds with R LE Gait Training: n/a Seated Long Arc Quad: 10 reps;Limitations Long Arc Quad Limitations: 5" holds Supine Straight Leg Raises: Right;10 reps;Limitations Sidelying Hip ABduction: Right;10 reps Hip ADduction: Right;10 reps Prone  Hamstring Curl: 10 reps Hip Extension: Right;10 reps Other Prone Exercises: TKE x10     Physical Therapy Assessment and Plan PT Assessment and Plan Clinical Impression Statement: Added standing exercises with minimal discomfort and cues. Pt. without extension lag today with SLR and no longer requiring AA with prone hamstring curls. Pt.with 0-80 degrees AROM (was 3-45 degrees).  Overall improving strength, function and ROM.  Estim held today due to painfree. PT Plan: Progress with lunges/squats next visit.     Problem List Patient Active Problem List  Diagnosis  . GERD (gastroesophageal reflux disease)  . Abdominal pain  . Diarrhea  . Knee pain  . Knee stiffness  . Difficulty in walking  . Abnormality of gait    PT - End of Session Equipment Utilized During Treatment: Gait belt Activity Tolerance: Patient tolerated treatment well General Behavior During Session: [facility] for tasks  performed Cognition: [facility] for tasks performed   Lurena Nida, PTA/CLT 04/13/2012, 5:41 PM

## 2012-04-15 ENCOUNTER — Ambulatory Visit (HOSPITAL_COMMUNITY)
Admission: RE | Admit: 2012-04-15 | Discharge: 2012-04-15 | Disposition: A | Discharge status: Home / Self Care | Payer: 59 | Source: Ambulatory Visit | Attending: Internal Medicine | Admitting: Internal Medicine

## 2012-04-15 ENCOUNTER — Ambulatory Visit (HOSPITAL_COMMUNITY): Payer: 59 | Admitting: *Deleted

## 2012-04-15 NOTE — Progress Notes (Addendum)
Physical Therapy Treatment Patient Details  Name: Tammy Chapman MRN: [id number] Date of Birth: June 18, 1991  Today's Date: 04/15/2012 Time: 7253-6644 PT Time Calculation (min): 38 min Charges: 26' TE Visit#: 3  of 12   Re-eval: 05/07/12   Subjective: Symptoms/Limitations Symptoms: Pt reports that her knee is hurting really bad today.  She states she had to wear heels all day yesterday for a visitation and funeral, which she will have to continue to do for her job  Exercise/Treatments Aerobic Stationary Bike: 6'@ 3.0 Standing Heel Raises: 20 reps Heel Raises Limitations: Toeraises 20 reps Lateral Step Up: Right;10 reps;Hand Hold: 0;Step Height: 4" Forward Step Up: Right;10 reps;Hand Hold: 0;Step Height: 4" Step Down: Right;10 reps;Hand Hold: 0;Step Height: 4" Functional Squat: 10 reps Rocker Board: 2 minutes Supine Straight Leg Raises: Right;15 reps Sidelying Hip ABduction: Right;15 reps Hip ADduction: Right;15 reps Prone  Hamstring Curl: 15 reps Hip Extension: Right;15 reps   Physical Therapy Assessment and Plan PT Assessment and Plan Clinical Impression Statement: Pt continues to improve her LE strength, however has moderate pain with exercises.  She was able to complete all exercises despite moderate pain  PT Plan: Cont to improve strength and ROM    Goals    Problem List Patient Active Problem List  Diagnosis  . GERD (gastroesophageal reflux disease)  . Abdominal pain  . Diarrhea  . Knee pain  . Knee stiffness  . Difficulty in walking  . Abnormality of gait    PT Plan of Care PT Patient Instructions: Educated on looking at dance shoes as heels she can wear to work.  Consulted and Agree with Plan of Care: Patient  Annett Fabian, PT 04/15/2012, 8:47 AM

## 2012-04-16 ENCOUNTER — Ambulatory Visit (HOSPITAL_COMMUNITY): Payer: 59 | Admitting: Physical Therapy

## 2012-04-16 ENCOUNTER — Ambulatory Visit (HOSPITAL_COMMUNITY)
Admission: RE | Admit: 2012-04-16 | Discharge: 2012-04-16 | Disposition: A | Discharge status: Home / Self Care | Payer: 59 | Source: Ambulatory Visit | Attending: Internal Medicine | Admitting: Internal Medicine

## 2012-04-16 NOTE — Progress Notes (Signed)
Physical Therapy Treatment Patient Details  Name: Tammy Chapman MRN: [id number] Date of Birth: 1991/05/01  Today's Date: 04/16/2012 Time: 4098-1191 PT Time Calculation (min): 58 min  Visit#: 4  of 12   Re-eval: 05/07/12  Charge: therex 45'. IFES/Ice 10'  Subjective: Symptoms/Limitations Symptoms: Pt reports she has been on her feet alot the past couple days, stated knee was really swollen last night.  She iced it last night which helped with the swelling but not the pain. Pain Assessment Currently in Pain?: Yes Pain Score:   6 Pain Location: Knee Pain Orientation: Right  Objective:   Exercise/Treatments Aerobic Stationary Bike: 8' @ 3.0 Standing Heel Raises: 20 reps Heel Raises Limitations: Toeraises 20 reps Lateral Step Up: Right;15 reps;Hand Hold: 0;Step Height: 4" Forward Step Up: Right;15 reps;Hand Hold: 0;Step Height: 4" Step Down: Right;15 reps;Hand Hold: 0;Step Height: 4" Functional Squat: 15 reps;Limitations Functional Squat Limitations: Proper lifting Rocker Board: 3 minutes;Limitations Rocker Board Limitations: R/L SLS: R SLS 20" max of 3 SLS with Vectors: 7x 5"  Seated Long Arc Quad: Right;15 reps;Limitations Long Arc Quad Limitations: 5" holds Supine Short Arc Quad Sets: Right;15 reps;Limitations Water quality scientist Limitations: 5" holds Straight Leg Raises: Right;15 reps Sidelying Hip ABduction: Right;15 reps Hip ADduction: Right;15 reps Prone  Hamstring Curl: 15 reps Hip Extension: Right;15 reps Other Prone Exercises: TKE 10 x10"   Modalities Modalities: Cryotherapy;Electrical Stimulation Cryotherapy Number Minutes Cryotherapy: 10 Minutes Cryotherapy Location: Knee Type of Cryotherapy: Ice pack Pharmacologist Location: R anterior knee Electrical Stimulation Action: IFES hi/lo sweep 14.5 v to reduce pain Electrical Stimulation Parameters: IFES hi/lo sweep 14.5 v with ice to reduce pain and decrease  swelling Electrical Stimulation Goals: Pain  Physical Therapy Assessment and Plan PT Assessment and Plan Clinical Impression Statement: Pt able to complete all therex though has pain with exercises. Noted visible quad fatigue increase with reps and new activities (SLS and supine SAQ for stabilty and quad strengthening).  Pt stated pain reduced at end of session. PT Plan: Cont to improve strength and ROM.  Add heel slides for ROM next session.    Goals    Problem List Patient Active Problem List  Diagnosis  . GERD (gastroesophageal reflux disease)  . Abdominal pain  . Diarrhea  . Knee pain  . Knee stiffness  . Difficulty in walking  . Abnormality of gait    PT - End of Session Activity Tolerance: Patient tolerated treatment well General Behavior During Session: [facility] for tasks performed Cognition: [facility] for tasks performed  GP    Juel Burrow 04/16/2012, 8:57 AM

## 2012-04-20 ENCOUNTER — Ambulatory Visit (HOSPITAL_COMMUNITY)
Admission: RE | Admit: 2012-04-20 | Discharge: 2012-04-20 | Disposition: A | Discharge status: Home / Self Care | Payer: 59 | Source: Ambulatory Visit | Attending: Orthopedic Surgery | Admitting: Orthopedic Surgery

## 2012-04-20 DIAGNOSIS — R262 Difficulty in walking, not elsewhere classified: Secondary | ICD-10-CM | POA: Insufficient documentation

## 2012-04-20 DIAGNOSIS — M6281 Muscle weakness (generalized): Secondary | ICD-10-CM | POA: Insufficient documentation

## 2012-04-20 DIAGNOSIS — IMO0001 Reserved for inherently not codable concepts without codable children: Secondary | ICD-10-CM | POA: Insufficient documentation

## 2012-04-20 DIAGNOSIS — M25569 Pain in unspecified knee: Secondary | ICD-10-CM | POA: Insufficient documentation

## 2012-04-20 NOTE — Progress Notes (Signed)
Physical Therapy Treatment Patient Details  Name: Tammy Chapman MRN: [id number] Date of Birth: 1991-07-03  Today's Date: 04/20/2012 Time: 7253-6644 PT Time Calculation (min): 67 min Visit#: 5  of 12   Re-eval: 05/07/12 Charges:  therex 45', estim unattended 15', ice X 1 unit    Subjective: Symptoms/Limitations Symptoms: Pt. states she feels her knee "sliding (pointing laterally)" when she gets up to pee at night without brace and when she's on the bike. Pain Assessment Currently in Pain?: Yes Pain Score:   4 Pain Location: Knee Pain Orientation: Right   Exercise/Treatments Standing Forward Lunges: 10 reps;Right Lateral Step Up: Right;15 reps;Hand Hold: 0;Step Height: 4" Forward Step Up: Right;15 reps;Hand Hold: 0;Step Height: 4" Step Down: Right;15 reps;Hand Hold: 0;Step Height: 4" Wall Squat: 10 reps;Limitations Wall Squat Limitations: with ball squeeze to target VMO SLS with Vectors: 7x 5"  Seated Long Arc Quad: 10 reps;Weights Long Arc Quad Weight: 5 lbs. Long [employer] Limitations: ER to target VMO 5" holds Supine Short Arc [employer] Sets: Right;15 reps;Limitations Water quality scientist Limitations: 5# with ER for VMO, 5" holds Straight Leg Raises: Right;Limitations;10 reps;2 sets Straight Leg Raises Limitations: 1 set neutral, 1 set ER   Modalities Modalities: Cryotherapy;Electrical Stimulation Manual Therapy Manual Therapy: Other (comment) Other Manual Therapy: Taping for lateral patellar tracking Cryotherapy Cryotherapy Location: Knee Type of Cryotherapy: Ice pack Pharmacologist Location: R anterior knee Electrical Stimulation Action: IFES, hi/lo sweep to reduce pain Electrical Stimulation Parameters: IFES, hi/lo sweep    Physical Therapy Assessment and Plan PT Assessment and Plan Clinical Impression Statement: Focused treatment today on VMO strengthening. Pt. complained of lateral tracking, which is worse without wearing her  brace. Applied kinesiotape to promote neutral patellar tracking.  States it is harder now to straighten her leg due to pain than it was 1 week ago; no known cause. Able to complete all activities/exercises with only slight c/o pain. Extension lag of 15 degrees today. Pain decreased 3/10 after estim and ice. PT Plan: Continue POC; discuss decreased extension with PT.     Problem List Patient Active Problem List  Diagnosis  . GERD (gastroesophageal reflux disease)  . Abdominal pain  . Diarrhea  . Knee pain  . Knee stiffness  . Difficulty in walking  . Abnormality of gait    PT - End of Session Equipment Utilized During Treatment: Gait belt Activity Tolerance: Patient tolerated treatment well General Behavior During Session: [facility] for tasks performed   Lurena Nida, PTA/CLT 04/20/2012, 6:00 PM

## 2012-04-22 ENCOUNTER — Ambulatory Visit (HOSPITAL_COMMUNITY)
Admission: RE | Admit: 2012-04-22 | Discharge: 2012-04-22 | Disposition: A | Discharge status: Home / Self Care | Payer: 59 | Source: Ambulatory Visit | Attending: Physical Therapy | Admitting: Physical Therapy

## 2012-04-22 NOTE — Progress Notes (Signed)
Physical Therapy Treatment Patient Details  Name: Tammy Chapman MRN: [id number] Date of Birth: October 24, 1990  Today's Date: 04/22/2012 Time: 4098-1191 PT Time Calculation (min): 55 min Visit#: 6  of 12   Re-eval: 05/07/12 Charges:  therex 35', estim unattended X 15', icepack X 1 unit    Subjective: Symptoms/Limitations Symptoms: Pt. reports her pain as 4/10 today; states the pain was alot lower with the kinesiotape but has fallen off from showering.  States her pain is up today due to having to go up and down alot of steps at school today. Pain Assessment Currently in Pain?: Yes Pain Score:   4 Pain Location: Knee Pain Orientation: Right   Exercise/Treatments Standing Heel Raises: 20 reps Heel Raises Limitations: Toeraises 20 reps Forward Lunges: 10 reps Lateral Step Up: Right;15 reps;Hand Hold: 0;Step Height: 4" Forward Step Up: Right;15 reps;Hand Hold: 0;Step Height: 4" Step Down: Right;15 reps;Hand Hold: 0;Step Height: 4" Wall Squat: 10 reps Wall Squat Limitations: with ball squeeze to target VMO Rocker Board: 3 minutes Rocker Board Limitations: R/L SLS with Vectors: 10 X 5" Seated Long Arc Quad: 2 sets;10 reps Long Arc Quad Weight: 5 lbs. Long [employer] Limitations: ER to target VMO 5" holds Supine Short Arc Quad Sets: Right;15 reps;Limitations Water quality scientist Limitations: 5# with ER to target VMO with 5" holds Straight Leg Raises: Right;Limitations;10 reps;2 sets Straight Leg Raises Limitations: 1 set neutral, 1 set ER     Modalities Modalities: Cryotherapy;Electrical Stimulation Manual Therapy Other Manual Therapy: Taping for lateral patellar tracking  Cryotherapy Number Minutes Cryotherapy: 15 Minutes Cryotherapy Location: Knee Type of Cryotherapy: Ice pack Pharmacologist Location: R knee Statistician Action: pain reduction Electrical Stimulation Parameters: IFES hi/lo sweep, 8-12 Volts X 15' with  icepack Electrical Stimulation Goals: Pain  Physical Therapy Assessment and Plan PT Assessment and Plan Clinical Impression Statement: Pt. with mild soreness in VMO after last visit, however stated felt more stable combined with kinesiotaping.  Continued extension lag noted today.  Pt. without complaint while performing therex today. PT Plan: Continue to focus on strengthening VMO, taping for neutral patellar tracking and increasing extension per POC.     Problem List Patient Active Problem List  Diagnosis  . GERD (gastroesophageal reflux disease)  . Abdominal pain  . Diarrhea  . Knee pain  . Knee stiffness  . Difficulty in walking  . Abnormality of gait    PT - End of Session Activity Tolerance: Patient tolerated treatment well General Behavior During Session: [facility] for tasks performed Cognition: [facility] for tasks performed   Lurena Nida, PTA/CLT 04/22/2012, 5:09 PM

## 2012-04-23 ENCOUNTER — Ambulatory Visit (HOSPITAL_COMMUNITY)
Admission: RE | Admit: 2012-04-23 | Discharge: 2012-04-23 | Disposition: A | Discharge status: Home / Self Care | Payer: 59 | Source: Ambulatory Visit | Attending: Physical Therapy | Admitting: Physical Therapy

## 2012-04-23 NOTE — Progress Notes (Signed)
Physical Therapy Treatment Patient Details  Name: Tammy Chapman MRN: [id number] Date of Birth: 01/19/1991  Today's Date: 04/23/2012 Time: 5621-3086 PT Time Calculation (min): 40 min Charges: 58' TE Visit#: 7  of 12   Re-eval: 05/07/12    Authorization:    Authorization Time Period:    Authorization Visit#:   of     Subjective: Symptoms/Limitations Symptoms: Pt reports that she is doing pretty well today. But overall my knee still feels very weak. Pain Assessment Currently in Pain?: Yes Pain Score:   2 Pain Location: Knee Pain Orientation: Right  Precautions/Restrictions     Exercise/Treatments  Standing Lateral Step Up: Right;10 reps;Step Height: 6" Forward Step Up: Right;10 reps;Step Height: 6" Step Down: Right;10 reps;Step Height: 6";Hand Hold: 0 Functional Squat: 15 reps Functional Squat Limitations: lifting soccer ball from 6 inch step SLS with Vectors: 10 X 5" (w/o HHA w/mod A for L hip ABD) Other Standing Knee Exercises: Heel and Toe Walking, Retro gait, Tandem gait 2 RT each Other Standing Knee Exercises: gait without brace to encourage heel strike and stability through RLE. Supine Heel Slides: Right;10 reps Straight Leg Raises: Right;10 reps  Physical Therapy Assessment and Plan PT Assessment and Plan Clinical Impression Statement: She continues to be limited by significant weakness to her distal quadricep which remain unstable.  She has difficulty with functional knee flexion and extension.  She continues to requires HHA w/balance activities.  However today able to demonstrate step downs from 6 in step without A.   PT Plan: Continue to focus on strengthening VMO, taping for neutral patellar tracking and increasing extension per POC.    Goals    Problem List Patient Active Problem List  Diagnosis  . GERD (gastroesophageal reflux disease)  . Abdominal pain  . Diarrhea  . Knee pain  . Knee stiffness  . Difficulty in walking  . Abnormality of gait     PT - End of Session Activity Tolerance: Patient tolerated treatment well General Behavior During Session: [facility] for tasks performed Cognition: [facility] for tasks performed  GP    Tiare Rohlman 04/23/2012, 1:41 PM

## 2012-04-26 ENCOUNTER — Ambulatory Visit (HOSPITAL_COMMUNITY)
Admission: RE | Admit: 2012-04-26 | Discharge: 2012-04-26 | Disposition: A | Discharge status: Home / Self Care | Payer: 59 | Source: Ambulatory Visit | Attending: Physical Therapy | Admitting: Physical Therapy

## 2012-04-26 NOTE — Progress Notes (Signed)
Physical Therapy Treatment Patient Details  Name: Tammy Chapman MRN: [id number] Date of Birth: 1991-01-22  Today's Date: 04/26/2012 Time: 6644-0347 PT Time Calculation (min): 60 min  Visit#: 8  of 12   Re-eval: 05/07/12 Charges: Therex x 30' Taping x 5' Ice w/estim x 15'  Subjective: Symptoms/Limitations Symptoms: Pt's chief complaint continues to be weakness.  Pain Assessment Currently in Pain?: No/denies   Exercise/Treatments Standing Lateral Step Up: Right;15 reps;Step Height: 6" Forward Step Up: Right;15 reps;Step Height: 6";Hand Hold: 0 Step Down: Right;15 reps;Step Height: 6";Hand Hold: 0 Functional Squat: 15 reps Functional Squat Limitations: lifting soccer ball from 6 inch step SLS with Vectors: 10 X 5", 1 UE assist and cues for stability Other Standing Knee Exercises: Heel and Toe Walking, Retro gait, Tandem gait 2 RT each Other Standing Knee Exercises: gait without brace to encourage heel strike and stability through RLE.   Modalities Modalities: Cryotherapy;Electrical Stimulation Manual Therapy Other Manual Therapy: Taping for lateral patellar tracking Cryotherapy Number Minutes Cryotherapy: 15 Minutes Cryotherapy Location: Knee Type of Cryotherapy: Ice pack Pharmacologist Location: R knee Statistician Action: pain reduction  Electrical Stimulation Parameters: IFES hi/lo sweep, 10 Volts X 15' with icepack  Electrical Stimulation Goals: Pain  Physical Therapy Assessment and Plan PT Assessment and Plan Clinical Impression Statement: Standing therex completed without brace on after consulting PT (LM). PT displays visible quad fatigue when completing therex without brace. Kinesio tape applied again this session to correct lateral tracking or R patella. Pt continues to be hypersensitive in this area. Ice and estim applied to R knee at end of session to limit pain and swelling. Pt reports 0/10 pain at end of session. PT  Plan: Continue to focus on strengthening VMO, taping for neutral patellar tracking and increasing extension per POC.     Problem List Patient Active Problem List  Diagnosis  . GERD (gastroesophageal reflux disease)  . Abdominal pain  . Diarrhea  . Knee pain  . Knee stiffness  . Difficulty in walking  . Abnormality of gait    PT - End of Session Activity Tolerance: Patient tolerated treatment well General Behavior During Session: [facility] for tasks performed Cognition: [facility] for tasks performed  Seth Bake, PTA 04/26/2012, 1:10 PM

## 2012-04-27 ENCOUNTER — Ambulatory Visit (HOSPITAL_COMMUNITY)
Admission: RE | Admit: 2012-04-27 | Discharge: 2012-04-27 | Disposition: A | Discharge status: Home / Self Care | Payer: 59 | Source: Ambulatory Visit | Attending: Physical Therapy | Admitting: Physical Therapy

## 2012-04-27 NOTE — Progress Notes (Addendum)
Physical Therapy Re-evaluation  Patient Details  Name: STARLIT RABURN MRN: [id number] Date of Birth: 1990/11/20  Today's Date: 04/27/2012 Time: 4098-1191 PT Time Calculation (min): 53 min  Visit#: 9  of 12   Re-eval: 05/07/12 Diagnosis: R patellar discolation Next MD Visit: Dr. Thurston Hole - Sept 12    Subjective Symptoms/Limitations Symptoms: Pt. states she returns to MD this Thursday.  States her pain is currently 2/10 but tolerable.  Pain ranges 0-6/10.  Reports  she feels it is overall improved but still feels she needs her brace all the time.   Pain Assessment Currently in Pain?: Yes Pain Score:   2 Pain Location: Knee Pain Orientation: Right  Precautions/Restrictions  Precautions Precautions: Other (comment) Precaution Comments: Knee brace to R knee while WB.  WBAT and progress to independent ambulation quickly  Sensation/Coordination/Flexibility/Functional Tests Functional Tests Functional Tests: LEFS: 48/80 (was 24/80)  Assessment RLE AROM (degrees) Right Knee Extension: 5  (was 3 at initial eval, decreased to 15 X 1 weeks ago) Right Knee Flexion: 110  (was 45 degrees)  RLE PROM (degrees) Right Knee Extension: 0  (was 0) Right Knee Flexion: 135  (was 48 degree)  RLE Strength Right Hip Flexion: 4/5 (was 3-/5) Right Hip Extension: 3+/5 (was 3/5) Right Hip ABduction: 3+/5 (was 3/5) Right Hip ADduction: 3/5 (was 3-/5) Right Knee Flexion: 3+/5 (was 3-/5) Right Knee Extension: 3+/5 (was 3-/5)  Palpation Palpation: allodynia to medial knee, pain and tenderness to popliteal fossa., joint lines, fibular head and distal quadricep muscles  (did have allodynia to medial knee)  Exercise/Treatments Mobility/Balance  Ambulation/Gait Ambulation/Gait: Yes Ambulation/Gait Assistance: 7: Independent Assistive device: None (with R knee brace, was walking with crutches) Gait Pattern: Decreased weight shift to right;Decreased stance time - right Stairs: Yes Stairs  Assistance: 6: Modified independent (Device/Increase time) Stair Management Technique: Alternating pattern;One rail Left Number of Stairs: 8  Height of Stairs: 6     Physical Therapy Assessment and Plan PT Assessment and Plan Clinical Impression Statement: Pt. has made average strength gain of 1/2 mm grade, improved in A/PROM and improved functional activity with use of R LE knee brace.  Pt with extreme antalgia without use of knee brace.  Pt. has met 1/4 STG's, 0/4 LTG's but is overall progressing well.  Pt will benefit from continued therapy to meet all goals and return to full function. PT Plan: Suggest to Continue to focus on exercise progression for strengthening VMO and return to normal gait without use of brace.   Frequency: 2-3x/week Duration: 3 weeks   Goals Pt will Perform Home Exercise Program: Independently: Met PT Short Term Goals 3 weeks PT Short Term Goal 1: Pt will report pain less than 5/10 for 50% of her day.: Met PT Short Term Goal 2: Pt will improve her knee AROM 0-115 degrees.: Progressing toward goal PT Short Term Goal 3: Pt will improve her LE strength by 1 muscle grade.: Progressing toward goal PT Short Term Goal 4: Pt will ambulate with mild gait impairments independently.: Not met PT Long Term Goals (6 weeks) PT Long Term Goal 1: Pt will ambulate indepdently x15 minutes with appropriate gait mechanics in order to return to safe community ambulation. : Partly met (can achieve while wearing brace on R knee but unable without) PT Long Term Goal 2: Pt will improve her dynamic gait and body awareness and demonstrate tandem gait x300 feet on dynamic surface w/o LOB in order to decrease fall risk. : Progressing toward goal Long Term Goal 3:  Pt will improve her LEFS to 50/80 for improved percieved functional ability. : Not met Long Term Goal 4: Pt will improve her R knee AROM 0-125 and improve strength to [facility] in order to ascend and descend 10 stairs independently in order to  safely enter community venues. Not met  Problem List Patient Active Problem List  Diagnosis  . GERD (gastroesophageal reflux disease)  . Abdominal pain  . Diarrhea  . Knee pain  . Knee stiffness  . Difficulty in walking  . Abnormality of gait    Lurena Nida, PTA/CLT 04/27/2012, 5:31 PM

## 2012-04-29 ENCOUNTER — Ambulatory Visit (HOSPITAL_COMMUNITY)
Admission: RE | Admit: 2012-04-29 | Discharge: 2012-04-29 | Disposition: A | Discharge status: Home / Self Care | Payer: 59 | Source: Ambulatory Visit | Attending: Physical Therapy | Admitting: Physical Therapy

## 2012-04-29 NOTE — Progress Notes (Signed)
Physical Therapy Treatment Patient Details  Name: Tammy Chapman MRN: [id number] Date of Birth: Jul 12, 1991  Today's Date: 04/29/2012 Time: 4098-1191 PT Time Calculation (min): 48 min Visit#: 10  of 24   Re-eval: 05/25/12 Charges:  therex 35', [place] 8', taping    Subjective: Symptoms/Limitations Symptoms: Pt. states MD wants her to continue therapy 3X week for 4 more weeks working on nerve desenstization and strengthening.  Pt. states if it's not improved in 4 more weeks he will want to do surgery. Pain Assessment Currently in Pain?: Yes Pain Score:   2 Pain Orientation: Right;Medial    All exercises performed with brace off and kinesiotape applied to prevent lateral patellar tracking Exercise/Treatments  Aerobic Elliptical: 4' L1 Standing Forward Lunges: 10 reps;Limitations Forward Lunges Limitations: no brace Lateral Step Up: 15 reps;Step Height: 6";Limitations Lateral Step Up Limitations: no brace Forward Step Up: 15 reps;Step Height: 6";Limitations Forward Step Up Limitations: no brace Step Down: 15 reps;Step Height: 6";Limitations Step Down Limitations: no brace Wall Squat: 10 reps Wall Squat Limitations: with soccer ball to target VMO SLS with Vectors: 10 X 5", 1 UE assist and cues for stability Seated Long Arc Quad: 15 reps;Weights Long Arc Quad Weight: 5 lbs.   Modalities Modalities: Ultrasound Manual Therapy Other Manual Therapy: Taping for lateral patellar tracking at beginning of session Ultrasound Ultrasound Location: R medial knee Ultrasound Parameters: 1.2w/cm2 pulsed with 3 MHz Ultrasound Goals: Pain;Other (Comment) (desensitization of nerve)  Physical Therapy Assessment and Plan PT Assessment and Plan Clinical Impression Statement: Brace removed and tape applied to promote neutral patellar tracking prior to activity.  Added elliptical and forward lunges today to POC without difficulty.  Began ultrasound to medial knee for desensitization with good  results.  Pt. encouraged to perform gentle massage to area and slowly increase pressure. PT Plan: REMOVE BRACE and apply kinesiotape to correct lateral patellar tracking prior to activity; Add treadmill (In addition to elliptical) next visit.  May try ice massage to medial knee following [place] to help decrease sensitivity.     Problem List Patient Active Problem List  Diagnosis  . GERD (gastroesophageal reflux disease)  . Abdominal pain  . Diarrhea  . Knee pain  . Knee stiffness  . Difficulty in walking  . Abnormality of gait    PT - End of Session Activity Tolerance: Patient tolerated treatment well General Behavior During Session: [facility] for tasks performed Cognition: [facility] for tasks performed   Lurena Nida, PTA/CLT 04/29/2012, 5:43 PM

## 2012-05-03 ENCOUNTER — Ambulatory Visit (HOSPITAL_COMMUNITY)
Admission: RE | Admit: 2012-05-03 | Discharge: 2012-05-03 | Disposition: A | Discharge status: Home / Self Care | Payer: 59 | Source: Ambulatory Visit | Attending: Physical Therapy | Admitting: Physical Therapy

## 2012-05-03 NOTE — Progress Notes (Signed)
Physical Therapy Treatment Patient Details  Name: Tammy Chapman MRN: [id number] Date of Birth: 1991-07-03  Today's Date: 05/03/2012 Time: 7829-5621 PT Time Calculation (min): 42 min Charges: 33' TE, 8' [place] Visit#: 11  of 24   Re-eval: 05/25/12   Subjective: Symptoms/Limitations Symptoms: I have walking without my knee brace all weekend and it has been going pretty well.  It is still coming out a little bit, but overall it is going well.  Pain Assessment Currently in Pain?: Yes Pain Score:   3 Pain Location: Knee  Exercise/Treatments Machines for Strengthening Cybex Knee Extension: 3 PL BLE x15 Cybex Knee Flexion: 4.5 PL x15 Standing SLS with Vectors: 10 X 5", 1 UE assist and cues for stability Other Standing Knee Exercises: Heel and Toe Walking, Retro gait, Tandem gait 3 RT each  Modalities Modalities: Ultrasound Ultrasound Ultrasound Location: R medial knee  Ultrasound Parameters: 1.2w/cm2 pulsed with 3 MHz  Ultrasound Goals: Pain;Other (Comment) (desensitization of nerve)  Physical Therapy Assessment and Plan PT Assessment and Plan Clinical Impression Statement: Completed all exercises without brace or taping in order to improve LE strength and focus on body mechanics. Continues to improve her overall LE strength, is limited by her increased pain with gentle touch.  PT Plan: May continue with KT if needed.  Add TM activities, continue with LE strengthening and ROM exercises.     Goals    Problem List Patient Active Problem List  Diagnosis  . GERD (gastroesophageal reflux disease)  . Abdominal pain  . Diarrhea  . Knee pain  . Knee stiffness  . Difficulty in walking  . Abnormality of gait    PT - End of Session Activity Tolerance: Patient tolerated treatment well General Behavior During Session: [facility] for tasks performed Cognition: [facility] for tasks performed  GP    Annalise Mcdiarmid 05/03/2012, 11:08 AM

## 2012-05-04 ENCOUNTER — Ambulatory Visit (HOSPITAL_COMMUNITY)
Admission: RE | Admit: 2012-05-04 | Discharge: 2012-05-04 | Disposition: A | Discharge status: Home / Self Care | Payer: 59 | Source: Ambulatory Visit | Attending: Physical Therapy | Admitting: Physical Therapy

## 2012-05-04 NOTE — Progress Notes (Signed)
Physical Therapy Treatment Patient Details  Name: Tammy Chapman MRN: [id number] Date of Birth: 08/03/1991  Today's Date: 05/04/2012 Time: 4098-1191 PT Time Calculation (min): 49 min Visit#: 12  of 24   Re-eval: 05/25/12 Charges:  therex 35', [place] 8'    Subjective: Symptoms/Limitations Symptoms: Pt. states just the "normal junky feeling" but no real pain.  Pt. continues to go without brace; only episode of feeling it was slipping out was with going up steps. Pain Assessment Currently in Pain?: Yes Pain Score:   2 Pain Location: Knee Pain Orientation: Right;Medial   Exercise/Treatments Aerobic Tread Mill: 2.5-3.4mph X 8 minutes Machines for Strengthening Cybex Knee Extension: 3 PL BLE x15 Cybex Knee Flexion: 4.5 PL x15 Standing Lateral Step Up: 15 reps;Step Height: 6";Limitations Lateral Step Up Limitations: no brace Forward Step Up: 15 reps;Step Height: 6";Limitations Forward Step Up Limitations: no brace Step Down: 15 reps;Step Height: 6";Limitations Step Down Limitations: no brace Wall Squat: 10 reps Wall Squat Limitations: with soccer ball to target VMO Rocker Board: 2 minutes Rocker Board Limitations: R/L and A/P SLS with Vectors: 10 X 5", 1 UE assist and cues for stability Other Standing Knee Exercises: Heel and Toe Walking, Retro gait, Tandem gait 3 RT each Other Standing Knee Exercises: balance beam 2RT    Modalities Modalities: Ultrasound Ultrasound Ultrasound Location: R Medial Knee Ultrasound Parameters: 1.2 w/cm2 pulsed with 3 MHz Ultrasound Goals: Pain (desensitization of nerve)  Physical Therapy Assessment and Plan PT Assessment and Plan Clinical Impression Statement: Pt. able to complete all exercises without brace/KT without c/o pain or difficulty.  Improving stability with less cues for posture during vector stance. Added treadmill with good patellar control, gait speed and posture. PT Plan: Continue per POC.     Problem List Patient Active  Problem List  Diagnosis  . GERD (gastroesophageal reflux disease)  . Abdominal pain  . Diarrhea  . Knee pain  . Knee stiffness  . Difficulty in walking  . Abnormality of gait    PT - End of Session Activity Tolerance: Patient tolerated treatment well General Behavior During Session: [facility] for tasks performed Cognition: [facility] for tasks performed   Lurena Nida, PTA/CLT 05/04/2012, 5:39 PM

## 2012-05-06 ENCOUNTER — Ambulatory Visit (HOSPITAL_COMMUNITY)
Admission: RE | Admit: 2012-05-06 | Discharge: 2012-05-06 | Disposition: A | Discharge status: Home / Self Care | Payer: 59 | Source: Ambulatory Visit | Attending: Physical Therapy | Admitting: Physical Therapy

## 2012-05-06 NOTE — Progress Notes (Signed)
Physical Therapy Treatment Patient Details  Name: Tammy Chapman MRN: [id number] Date of Birth: Nov 30, 1990  Today's Date: 05/06/2012 Time: 4098-1191 PT Time Calculation (min): 55 min  Visit#: 13  of 24   Re-eval: 05/25/12  Charge: therex 45, [place] 8'  Subjective: Symptoms/Limitations Symptoms: Pt reported thighs are really sore today.  Had to do alot of hiking and climbing fences with work.  Pain scale 4/10 Pain Assessment Currently in Pain?: Yes Pain Score:   4 Pain Location: Knee Pain Orientation: Right;Medial  Objective:   Exercise/Treatments Stretches Gastroc Stretch: 3 reps;30 seconds Aerobic Tread Mill: 2.5-3.5 mph X 8 minutes Machines for Strengthening Cybex Knee Extension: 3.5 PL BLE x15 Cybex Knee Flexion: 4.5 PL 2 x15 Standing Lateral Step Up: 15 reps;Step Height: 6";Limitations;Hand Hold: 0 Lateral Step Up Limitations: no brace Forward Step Up: 15 reps;Step Height: 6";Limitations;Hand Hold: 0 Forward Step Up Limitations: no brace Step Down: 15 reps;Step Height: 6";Limitations;Hand Hold: 0 Step Down Limitations: no brace Wall Squat: 10 reps Wall Squat Limitations: 10" holds with soccer ball to target VMO Rocker Board: 2 minutes Rocker Board Limitations: R/L and A/P SLS with Vectors: 5x 10" no HHA, cueing for posture/stability Other Standing Knee Exercises: Heel and toe walking 2 RT Other Standing Knee Exercises: tandem and retro tandem gait on balance beam 3RT   Modalities Modalities: Ultrasound Ultrasound Ultrasound Location: R medial knee Ultrasound Parameters: 1.2 w/cm2 pulsed with 3 MHz  Ultrasound Goals: Pain  Physical Therapy Assessment and Plan PT Assessment and Plan Clinical Impression Statement: Pt able to complete all therex without brace with no c/o or pain.  Able to increase 1/2 plate with noted visible quad fatigue but good control.  Pt able to increase gait speed on treadmill this session with good posture, patellar control and no reports of  increased pain. PT Plan: Continue progressing per PT POC.    Goals    Problem List Patient Active Problem List  Diagnosis  . GERD (gastroesophageal reflux disease)  . Abdominal pain  . Diarrhea  . Knee pain  . Knee stiffness  . Difficulty in walking  . Abnormality of gait    PT - End of Session Activity Tolerance: Patient tolerated treatment well General Behavior During Session: [facility] for tasks performed Cognition: [facility] for tasks performed  GP    Juel Burrow 05/06/2012, 6:20 PM

## 2012-05-10 ENCOUNTER — Ambulatory Visit (HOSPITAL_COMMUNITY)
Admission: RE | Admit: 2012-05-10 | Discharge: 2012-05-10 | Disposition: A | Discharge status: Home / Self Care | Payer: 59 | Source: Ambulatory Visit | Attending: Physical Therapy | Admitting: Physical Therapy

## 2012-05-10 NOTE — Progress Notes (Signed)
Physical Therapy Treatment Patient Details  Name: Tammy Chapman MRN: [id number] Date of Birth: 10-Sep-1990  Today's Date: 05/10/2012 Time: 0927-1005 PT Time Calculation (min): 38 min  Visit#: 14  of 24   Re-eval: 05/25/12  Charge: therex 22', Kinesiotape x 1 unit, manual 8', [place] 8'  Subjective: Symptoms/Limitations Symptoms: Pt reoprted sharp pain medial portion R knee with increased speed on treadmill.  Stated pain scale 5/10 today. Pain Assessment Currently in Pain?: Yes Pain Score:   5 Pain Location: Knee Pain Orientation: Right;Medial  Objective:   Exercise/Treatments Stretches Passive Hamstring Stretch: 2 reps;30 seconds;Limitations Passive Hamstring Stretch Limitations: with rope Quad Stretch: 2 reps;30 seconds;Limitations Lobbyist Limitations: with rope Gastroc Stretch: 3 reps;30 seconds Aerobic Tread Mill: 2.8-->3.5 mph x 8 minutes Machines for Strengthening Cybex Knee Extension: 3.5 PL BLE 2 x15 Cybex Knee Flexion: 4.5 PL 2 x15 Standing Wall Squat: 10 reps Wall Squat Limitations: 10" holds with soccer ball to target VMO  Manual: joint mobs patella tracking R/L; S/I and tib/fib Modalities Modalities: Ultrasound Manual Therapy Other Manual Therapy: Taping for lateral patellar tracking  Ultrasound Ultrasound Location: R medial knee Ultrasound Parameters: 1.2 w/cm2 3 MHz continous  Ultrasound Goals: Pain  Physical Therapy Assessment and Plan PT Assessment and Plan Clinical Impression Statement: Session shorted due to pt having to go into work, unable to complete full therex this session.  Pt able to complete therex with good form without brace through session though did complian of sharp burning pain medial region of R knee with increased speed on treadmill.  Noted increase quad fatigue with wall squat.  Pt stated pain resolved at end of session following manual, [place] and kinesiotape. PT Plan: Continue progressing per PT POC    Goals    Problem  List Patient Active Problem List  Diagnosis  . GERD (gastroesophageal reflux disease)  . Abdominal pain  . Diarrhea  . Knee pain  . Knee stiffness  . Difficulty in walking  . Abnormality of gait    PT - End of Session Activity Tolerance: Patient tolerated treatment well General Behavior During Session: [facility] for tasks performed Cognition: [facility] for tasks performed  GP    Juel Burrow 05/10/2012, 12:14 PM

## 2012-05-11 ENCOUNTER — Ambulatory Visit (HOSPITAL_COMMUNITY): Payer: 59 | Admitting: Physical Therapy

## 2012-05-12 ENCOUNTER — Ambulatory Visit (HOSPITAL_COMMUNITY)
Admission: RE | Admit: 2012-05-12 | Discharge: 2012-05-12 | Disposition: A | Discharge status: Home / Self Care | Payer: 59 | Source: Ambulatory Visit | Attending: Orthopedic Surgery | Admitting: Orthopedic Surgery

## 2012-05-12 NOTE — Progress Notes (Signed)
Physical Therapy Treatment Patient Details  Name: Tammy Chapman MRN: [id number] Date of Birth: 1991-06-26  Today's Date: 05/12/2012 Time: 4098-1191 PT Time Calculation (min): 28 min Charges:  therex 24', kinesiotaping X 1 unit Visit#: 15  of 24   Re-eval: 05/19/12 (Pt. returns to MD 05/20/12)   Subjective: Symptoms/Limitations Symptoms: Pt. states her R knee is painfree and doing much better, however she dislocated her L knee on Monday. Reports she was able to put it back into place but now it is hurting badly. Advised pt. to return to MD to get it checked out. Pt. reports she has to be at work by [employer], so has to leave here by 8:30.  Pain Assessment Currently in Pain?: No/denies Multiple Pain Sites: Yes   Exercise/Treatments Machines for Strengthening Cybex Knee Extension: 3.5 PL BLE 2 x15 Cybex Knee Flexion: 4.5 PL 2 x15 Standing Wall Squat: 15 reps Wall Squat Limitations: 10" holds with soccer ball to target VMO    Physical Therapy Assessment and Plan PT Assessment and Plan Clinical Impression Statement: Unable to complete all exercises due to pt. having to leave for work.  Taped B LE's to promote neutral patellar tracking.  Pt is wearing her brace on her L side now until returns to MD 05/20/12.  R knee is much improved and stronger than initially. PT Plan: Continue current POC.  Will need re-eval prior to MD appt. on 05/20/12.      Problem List Patient Active Problem List  Diagnosis  . GERD (gastroesophageal reflux disease)  . Abdominal pain  . Diarrhea  . Knee pain  . Knee stiffness  . Difficulty in walking  . Abnormality of gait    PT - End of Session Activity Tolerance: Patient tolerated treatment well General Behavior During Session: [facility] for tasks performed Cognition: [facility] for tasks performed   Lurena Nida, PTA/CLT 05/12/2012, 8:37 AM

## 2012-05-14 ENCOUNTER — Ambulatory Visit (HOSPITAL_COMMUNITY)
Admission: RE | Admit: 2012-05-14 | Discharge: 2012-05-14 | Disposition: A | Discharge status: Home / Self Care | Payer: 59 | Source: Ambulatory Visit | Attending: Internal Medicine | Admitting: Internal Medicine

## 2012-05-14 NOTE — Progress Notes (Signed)
Physical Therapy Treatment Patient Details  Name: Tammy Chapman MRN: [id number] Date of Birth: 18-May-1991  Today's Date: 05/14/2012 Time: 0812-0837 PT Time Calculation (min): 25 min  Visit#: 16  of 24   Re-eval: 05/19/12 (Pt. returns to MD 05/20/12) Charges: Therex x 15' Taping x 8'   Subjective: Symptoms/Limitations Symptoms: Pt is pain free in R knee but reprots some pain in L knee. Pain Assessment Currently in Pain?: Yes Pain Score:   4 Pain Location: Knee Pain Orientation: Left  Precautions/Restrictions     Exercise/Treatments Mobility/Balance        Stretches   Aerobic   Machines for Strengthening Cybex Knee Extension: 3.5 PL BLE 2 x15 Cybex Knee Flexion: 4.5 PL 2 x15 Plyometrics   Standing Wall Squat: 15 reps Wall Squat Limitations: 10" holds with soccer ball to target VMO Seated   Supine   Sidelying   Prone      Manual Therapy Manual Therapy: Other (comment) Other Manual Therapy: B taping for lateral patellar tracking   Physical Therapy Assessment and Plan PT Assessment and Plan Clinical Impression Statement: Pt presents to therapy before scheduled appointment and was unable to stay for full tx time secondary to work schedule. Pt ambulates into therapy without brace on either knee. B knee's taped for to improve patellar stability. L patella presents with greater instability than R.  PT Plan: Continue current POC.  Will need re-eval prior to MD appt. on 05/20/12.    Goals    Problem List Patient Active Problem List  Diagnosis  . GERD (gastroesophageal reflux disease)  . Abdominal pain  . Diarrhea  . Knee pain  . Knee stiffness  . Difficulty in walking  . Abnormality of gait    PT - End of Session Activity Tolerance: Patient tolerated treatment well General Behavior During Session: [facility] for tasks performed Cognition: [facility] for tasks performed  GP    Antonieta Iba 05/14/2012, 8:45 AM

## 2012-05-17 ENCOUNTER — Ambulatory Visit (HOSPITAL_COMMUNITY)
Admission: RE | Admit: 2012-05-17 | Discharge: 2012-05-17 | Disposition: A | Discharge status: Home / Self Care | Payer: 59 | Source: Ambulatory Visit | Attending: Physical Therapy | Admitting: Physical Therapy

## 2012-05-17 NOTE — Progress Notes (Signed)
Physical Therapy Re-Evaluation  Patient Details  Name: Tammy Chapman MRN: [id number] Date of Birth: [birth year]  Today's Date: 05/17/2012 Time: 0932-1008 PT Time Calculation (min): 36 min Charges:  1 MMT, 8 Tape, 15' Tape Visit#: 17  of 24   Re-eval:   Assessment Diagnosis: R patellar discolation Next MD Visit: 05/20/12  Subjective Symptoms/Limitations Symptoms: Pt. reports she is doing well and hasn't had pain in either knee.  Feels she is ready for discharge.  Precautions/Restrictions  Precautions Precautions: Other (comment) Precaution Comments: Knee brace to R knee while WB.  WBAT and progress to independent ambulation quickly   Sensation/Coordination/Flexibility/Functional Tests Functional Tests Functional Tests: LEFS: 74/80 (was 48/80)  Assessment RLE AROM (degrees) Right Knee Extension: 0  (was 5) Right Knee Flexion: 150  (was 110) RLE PROM (degrees) Right Knee Extension: 0  Right Knee Flexion:  (was 135) RLE Strength Right Hip Flexion: 5/5 (was 4/5) Right Hip Extension: 5/5 (was 3+/5) Right Hip ABduction: 5/5 (was 3+/5) Right Hip ADduction: 4/5 (was 3/5) Right Knee Flexion: 5/5 (was 3+/5) Right Knee Extension: 5/5 (was 3+/5)  Exercise/Treatments Aerobic Elliptical: 5' L 2   Tape to Bilateral knees w/kinesotape to decrease lateral tracking.  Physical Therapy Assessment and Plan PT Assessment and Plan Clinical Impression Statement: Pt. has met all goals, has normal return of motion and strength and has not had pain in R LE X several weeks.  Pt. is comfortable continuing her strengthening at home.  Pt. instructed to buy kinesiotape and tape LE for patellar stability.  Only diffiuculty pt. reports is negotiating steps due to slight discomfort.  Balance has improved and is no longer having issues with LOB. PT Plan: Discharge to [facility].    Goals Pt will Perform Home Exercise Program: Independently Met PT Short Term Goals Time to Complete Short Term Goals: 3  weeks PT Short Term Goal 1: Pt will report pain less than 5/10 for 50% of her day. Met (painfree) PT Short Term Goal 2: Pt will improve her knee AROM 0-115 degrees. Met (now 0-150 degrees) PT Short Term Goal 3: Pt will improve her LE strength by 1 muscle grade. Met PT Short Term Goal 4: Pt will ambulate with mild gait impairments independently.  Met (no gait impairments) PT Long Term Goals Time to Complete Long Term Goals: Other (comment) (6 weeks) PT Long Term Goal 1: Pt will ambulate indepdently x15 minutes with appropriate gait mechanics in order to return to safe community ambulation. Met  PT Long Term Goal 2: Pt will improve her dynamic gait and body awareness and demonstrate tandem gait x300 feet on dynamic surface w/o LOB in order to decrease fall risk. Met Long Term Goal 3: Pt will improve her LEFS to 50/80 for improved percieved functional ability. Met Long Term Goal 4: Pt will improve her R knee AROM 0-125 and improve strength to [facility] in order to ascend and descend 10 stairs independently in order to safely enter community venues. Met  Problem List Patient Active Problem List  Diagnosis  . GERD (gastroesophageal reflux disease)  . Abdominal pain  . Diarrhea  . Knee pain  . Knee stiffness  . Difficulty in walking  . Abnormality of gait    PT - End of Session Activity Tolerance: Patient tolerated treatment well General Behavior During Session: [facility] for tasks performed Cognition: [facility] for tasks performed   Lurena Nida, PTA/CLT 05/17/2012, 10:14 AM

## 2012-05-19 ENCOUNTER — Ambulatory Visit (HOSPITAL_COMMUNITY): Payer: 59 | Admitting: Physical Therapy

## 2012-05-21 ENCOUNTER — Ambulatory Visit (HOSPITAL_COMMUNITY): Payer: 59 | Admitting: Physical Therapy

## 2012-05-24 ENCOUNTER — Ambulatory Visit (HOSPITAL_COMMUNITY): Payer: 59 | Admitting: Physical Therapy

## 2012-05-26 ENCOUNTER — Ambulatory Visit (HOSPITAL_COMMUNITY): Payer: 59 | Admitting: Physical Therapy

## 2012-05-28 ENCOUNTER — Ambulatory Visit (HOSPITAL_COMMUNITY): Payer: 59 | Admitting: *Deleted

## 2012-07-14 ENCOUNTER — Ambulatory Visit: Payer: 59 | Admitting: Gynecology

## 2012-08-04 ENCOUNTER — Other Ambulatory Visit (INDEPENDENT_AMBULATORY_CARE_PROVIDER_SITE_OTHER): Payer: Self-pay | Admitting: Internal Medicine

## 2012-10-02 ENCOUNTER — Encounter (HOSPITAL_COMMUNITY): Payer: Self-pay

## 2012-10-02 ENCOUNTER — Emergency Department (HOSPITAL_COMMUNITY)
Admission: EM | Admit: 2012-10-02 | Discharge: 2012-10-02 | Disposition: A | Discharge status: Home / Self Care | Payer: 59 | Source: Home / Self Care | Attending: Emergency Medicine | Admitting: Emergency Medicine

## 2012-10-02 DIAGNOSIS — H6692 Otitis media, unspecified, left ear: Secondary | ICD-10-CM

## 2012-10-02 DIAGNOSIS — H6690 Otitis media, unspecified, unspecified ear: Secondary | ICD-10-CM

## 2012-10-02 DIAGNOSIS — H60399 Other infective otitis externa, unspecified ear: Secondary | ICD-10-CM

## 2012-10-02 DIAGNOSIS — H6092 Unspecified otitis externa, left ear: Secondary | ICD-10-CM

## 2012-10-02 MED ORDER — HYDROCODONE-ACETAMINOPHEN 5-325 MG PO TABS
2.0000 | ORAL_TABLET | Freq: Once | ORAL | Status: AC
  Administered 2012-10-02: 2 via ORAL

## 2012-10-02 MED ORDER — ANTIPYRINE-BENZOCAINE 5.4-1.4 % OT SOLN: 3.0000 [drp] | Freq: Once | OTIC | Status: DC

## 2012-10-02 MED ORDER — CEFIXIME 400 MG PO TABS: 400.0000 mg | ORAL_TABLET | Freq: Every day | ORAL | Status: DC

## 2012-10-02 MED ORDER — HYDROCODONE-ACETAMINOPHEN 5-325 MG PO TABS: 1.0000 | ORAL_TABLET | ORAL | Status: DC | PRN

## 2012-10-02 MED ORDER — HYDROCODONE-ACETAMINOPHEN 5-325 MG PO TABS
ORAL_TABLET | ORAL | Status: AC
  Filled 2012-10-02: qty 2

## 2012-10-02 MED ORDER — OFLOXACIN 0.3 % OT SOLN: 10.0000 [drp] | Freq: Every day | OTIC | Status: DC

## 2012-10-02 NOTE — ED Notes (Signed)
C/o pain in ear; reportedly had fever, drained last PM . Has tubes in ears.

## 2012-10-02 NOTE — ED Notes (Signed)
Pt refused ear rops, as her ENT inst her to not use benzocaine drops; Schorr, NP advised

## 2012-10-02 NOTE — ED Provider Notes (Signed)
Medical screening examination/treatment/procedure(s) were performed by non-physician practitioner and as supervising physician I was immediately available for consultation/collaboration.  [name]   Kerisha Goughnour, MD 10/02/12 1542 

## 2012-10-02 NOTE — ED Provider Notes (Signed)
History     CSN: [id number]  Arrival date & time 10/02/12  1059   First MD Initiated Contact with Patient 10/02/12 1117      Chief Complaint  Patient presents with  . Otalgia    (Consider location/radiation/quality/duration/timing/severity/associated sxs/prior treatment) Patient is a 22 y.o. female presenting with ear pain. The history is provided by the patient.  Otalgia Location:  Left Behind ear:  No abnormality Quality:  Aching, sore and throbbing Severity:  Severe Onset quality:  Gradual Duration:  3 days Timing:  Constant Progression:  Worsening Chronicity:  Chronic Relieved by:  Nothing Associated symptoms: ear discharge and fever   Associated symptoms: no congestion, no cough, no headaches, no hearing loss, no rash, no rhinorrhea, no sore throat and no vomiting   Risk factors: chronic ear infection   Pt reports 3 day h/o increasing (L) ear pain. States pain is both inside the ear as well as the external ear.  Pain has been associated w/ drainage that is essentially clear and fever as high as 102. Denies bloody or pus-like drainage. Pt admits to h/o chronic ear infections since childhood. Has bil tubes in place that they had to reinsert when she was 22 y/o though she states it does not seem to have improved her incidence of ear infections. Denies recent URI sx's. Denies ear tx.  Past Medical History  Diagnosis Date  . GERD (gastroesophageal reflux disease)   . Chest pain   . Dizziness   . Anxiety     Past Surgical History  Procedure Laterality Date  . Cholecystectomy  01/20/08    PAUL TOTH  . Myringotomy      Tubes in ears  . Tonsillectomy      age 29  . Esophagogastroduodenoscopy  05/15/2011    Procedure: ESOPHAGOGASTRODUODENOSCOPY (EGD);  Surgeon: Malissa Hippo, MD;  Location: [facility];  Service: Endoscopy;  Laterality: N/A;  3:15     History reviewed. No pertinent family history.  History  Substance Use Topics  . Smoking status: Never Smoker   .  Smokeless tobacco: Never Used  . Alcohol Use: No    OB History   Grav Para Term Preterm Abortions TAB SAB Ect Mult Living                  Review of Systems  Constitutional: Positive for fever.  HENT: Positive for ear pain and ear discharge. Negative for hearing loss, congestion, sore throat and rhinorrhea.   Eyes: Negative.   Respiratory: Negative.  Negative for cough.   Cardiovascular: Negative.   Gastrointestinal: Negative.  Negative for vomiting.  Endocrine: Negative.   Genitourinary: Negative.   Skin: Negative for rash.  Allergic/Immunologic: Negative.   Neurological: Negative.  Negative for headaches.  Hematological: Negative.   Psychiatric/Behavioral: Negative.     Allergies  Benadryl; Penicillins; Ultram; and Sulfa antibiotics  Home Medications   Current Outpatient Rx  Name  Route  Sig  Dispense  Refill  . acetaminophen (TYLENOL) 500 MG tablet   Oral   Take 1,000 mg by mouth every 6 (six) hours as needed. For pain          . cetirizine (ZYRTEC) 10 MG tablet   Oral   Take 10 mg by mouth daily.           Marland Kitchen dexlansoprazole (DEXILANT) 60 MG capsule   Oral   Take 60 mg by mouth daily.           Marland Kitchen dicyclomine (BENTYL)  10 MG capsule      TAKE 2 CAPSULES BY MOUTH TWICE DAILY BEFORE MEALS   60 capsule   3     BP 120/74  Pulse 102  Temp(Src) 98.9 F (37.2 C) (Oral)  Resp 19  SpO2 100%  Physical Exam  Nursing note and vitals reviewed. Constitutional: She is oriented to person, place, and time. She appears well-developed and well-nourished.  HENT:  Head: Normocephalic and atraumatic.  Right Ear: Tympanic membrane, external ear and ear canal normal.  Left Ear: There is drainage, swelling and tenderness. Tympanic membrane is erythematous. A middle ear effusion is present.  Nose: Nose normal.  Mouth/Throat: Uvula is midline, oropharynx is clear and moist and mucous membranes are normal.  Tragus and helix very TTP when palpated.  Bilateral TM tubes  noted.  Eyes: Conjunctivae are normal.  Neck: Neck supple.  Cardiovascular: Regular rhythm.  Tachycardia present.   Rate 102  Pulmonary/Chest: Effort normal and breath sounds normal.  Musculoskeletal: Normal range of motion.  Lymphadenopathy:    She has no cervical adenopathy.  Neurological: She is alert and oriented to person, place, and time.  Skin: Skin is warm and dry.  Psychiatric: She has a normal mood and affect.    ED Course  Procedures  Labs Reviewed - No data to display No results found.   No diagnosis found.    MDM  Pt w/ AOME and AOM of (L) ear by exam and history. States Ciprodex does not help her and best PO abx for her is Suprax. Can not take Auralgan drops as directed by her ENT. Will treat w/ Ofloxin Otic qtts 10 qtts to (L) ear QD x 7 days and Suprax 400 mg PO qd x 7 days and short course of medication for pain. Pt encouraged to f/u w/ ENT as soon as can be arranged.        Leanne Chang, NP 10/02/12 1154  Roma Kayser Schorr, NP 10/02/12 1201

## 2012-10-02 NOTE — ED Notes (Signed)
Call from [employer] employee of [facility], c/o high co-pay at non-approved  Pharmacy. Spoke w K Psychologist, clinical , who states this is what was requested by pt/family . Per Ms Schorr, pt can wait until Monday AM to turn in Rx for less costly Rx co-pay, or can use amoxicillin 1 GM, Q 8 H x 1o days Spoke directly w pharmacist, Leonette Monarch, who is to give the pt these options , as the Rx was dropped off, pt not there

## 2013-02-15 DIAGNOSIS — S83419A Sprain of medial collateral ligament of unspecified knee, initial encounter: Secondary | ICD-10-CM

## 2013-02-15 DIAGNOSIS — M2341 Loose body in knee, right knee: Secondary | ICD-10-CM

## 2013-02-15 HISTORY — DX: Loose body in knee, right knee: M23.41

## 2013-02-15 HISTORY — DX: Sprain of medial collateral ligament of unspecified knee, initial encounter: S83.419A

## 2013-03-08 ENCOUNTER — Encounter (HOSPITAL_BASED_OUTPATIENT_CLINIC_OR_DEPARTMENT_OTHER): Payer: Self-pay | Admitting: *Deleted

## 2013-03-08 DIAGNOSIS — W57XXXA Bitten or stung by nonvenomous insect and other nonvenomous arthropods, initial encounter: Secondary | ICD-10-CM

## 2013-03-08 HISTORY — DX: Bitten or stung by nonvenomous insect and other nonvenomous arthropods, initial encounter: W57.XXXA

## 2013-03-15 ENCOUNTER — Ambulatory Visit (HOSPITAL_BASED_OUTPATIENT_CLINIC_OR_DEPARTMENT_OTHER): Payer: 59 | Admitting: *Deleted

## 2013-03-15 ENCOUNTER — Other Ambulatory Visit: Payer: Self-pay | Admitting: Physician Assistant

## 2013-03-15 ENCOUNTER — Encounter (HOSPITAL_BASED_OUTPATIENT_CLINIC_OR_DEPARTMENT_OTHER)
Admission: RE | Disposition: A | Discharge status: Home / Self Care | Payer: Self-pay | Source: Ambulatory Visit | Attending: Orthopedic Surgery

## 2013-03-15 ENCOUNTER — Encounter (HOSPITAL_BASED_OUTPATIENT_CLINIC_OR_DEPARTMENT_OTHER): Payer: Self-pay | Admitting: *Deleted

## 2013-03-15 ENCOUNTER — Ambulatory Visit (HOSPITAL_BASED_OUTPATIENT_CLINIC_OR_DEPARTMENT_OTHER)
Admission: RE | Admit: 2013-03-15 | Discharge: 2013-03-15 | Disposition: A | Discharge status: Home / Self Care | Payer: 59 | Source: Ambulatory Visit | Attending: Orthopedic Surgery | Admitting: Orthopedic Surgery

## 2013-03-15 DIAGNOSIS — X58XXXA Exposure to other specified factors, initial encounter: Secondary | ICD-10-CM | POA: Insufficient documentation

## 2013-03-15 DIAGNOSIS — Z888 Allergy status to other drugs, medicaments and biological substances status: Secondary | ICD-10-CM | POA: Insufficient documentation

## 2013-03-15 DIAGNOSIS — Z882 Allergy status to sulfonamides status: Secondary | ICD-10-CM | POA: Insufficient documentation

## 2013-03-15 DIAGNOSIS — E739 Lactose intolerance, unspecified: Secondary | ICD-10-CM | POA: Insufficient documentation

## 2013-03-15 DIAGNOSIS — S83289A Other tear of lateral meniscus, current injury, unspecified knee, initial encounter: Secondary | ICD-10-CM | POA: Insufficient documentation

## 2013-03-15 DIAGNOSIS — Z9109 Other allergy status, other than to drugs and biological substances: Secondary | ICD-10-CM | POA: Insufficient documentation

## 2013-03-15 DIAGNOSIS — Z91040 Latex allergy status: Secondary | ICD-10-CM | POA: Insufficient documentation

## 2013-03-15 DIAGNOSIS — M24469 Recurrent dislocation, unspecified knee: Secondary | ICD-10-CM | POA: Insufficient documentation

## 2013-03-15 DIAGNOSIS — Z885 Allergy status to narcotic agent status: Secondary | ICD-10-CM | POA: Insufficient documentation

## 2013-03-15 DIAGNOSIS — M2340 Loose body in knee, unspecified knee: Secondary | ICD-10-CM | POA: Insufficient documentation

## 2013-03-15 DIAGNOSIS — K219 Gastro-esophageal reflux disease without esophagitis: Secondary | ICD-10-CM | POA: Insufficient documentation

## 2013-03-15 DIAGNOSIS — Z880 Allergy status to penicillin: Secondary | ICD-10-CM | POA: Insufficient documentation

## 2013-03-15 HISTORY — DX: Sprain of medial collateral ligament of unspecified knee, initial encounter: S83.419A

## 2013-03-15 HISTORY — DX: Bitten or stung by nonvenomous insect and other nonvenomous arthropods, initial encounter: W57.XXXA

## 2013-03-15 HISTORY — DX: Loose body in knee, right knee: M23.41

## 2013-03-15 HISTORY — PX: KNEE ARTHROSCOPY: SHX127

## 2013-03-15 LAB — POCT HEMOGLOBIN-HEMACUE: Hemoglobin: 13.8 g/dL (ref 12.0–15.0)

## 2013-03-15 SURGERY — ARTHROSCOPY, KNEE
Anesthesia: General | Site: Knee | Laterality: Right | Wound class: Clean

## 2013-03-15 MED ORDER — LIDOCAINE HCL (CARDIAC) 20 MG/ML IV SOLN
INTRAVENOUS | Status: DC | PRN
  Administered 2013-03-15: 60 mg via INTRAVENOUS

## 2013-03-15 MED ORDER — LACTATED RINGERS IV SOLN
INTRAVENOUS | Status: DC
  Administered 2013-03-15 (×2): via INTRAVENOUS

## 2013-03-15 MED ORDER — ONDANSETRON HCL 4 MG PO TABS: 4.0000 mg | ORAL_TABLET | Freq: Four times a day (QID) | ORAL | Status: DC | PRN

## 2013-03-15 MED ORDER — HYDROMORPHONE HCL PF 1 MG/ML IJ SOLN: 0.2500 mg | INTRAMUSCULAR | Status: DC | PRN

## 2013-03-15 MED ORDER — BUPIVACAINE HCL (PF) 0.25 % IJ SOLN
INTRAMUSCULAR | Status: DC | PRN
  Administered 2013-03-15: 20 mL

## 2013-03-15 MED ORDER — SODIUM CHLORIDE 0.9 % IV SOLN: INTRAVENOUS | Status: DC

## 2013-03-15 MED ORDER — BUPIVACAINE HCL 0.25 % IJ SOLN
INTRAMUSCULAR | Status: DC | PRN
  Administered 2013-03-15: 20 mL

## 2013-03-15 MED ORDER — METOCLOPRAMIDE HCL 5 MG/ML IJ SOLN: 5.0000 mg | Freq: Three times a day (TID) | INTRAMUSCULAR | Status: DC | PRN

## 2013-03-15 MED ORDER — DEXAMETHASONE SODIUM PHOSPHATE 4 MG/ML IJ SOLN
INTRAMUSCULAR | Status: DC | PRN
  Administered 2013-03-15: 10 mg via INTRAVENOUS

## 2013-03-15 MED ORDER — BUPIVACAINE-EPINEPHRINE PF 0.5-1:200000 % IJ SOLN
INTRAMUSCULAR | Status: DC | PRN
  Administered 2013-03-15: 30 mL

## 2013-03-15 MED ORDER — METHOCARBAMOL 500 MG PO TABS: 500.0000 mg | ORAL_TABLET | Freq: Four times a day (QID) | ORAL | Status: DC | PRN

## 2013-03-15 MED ORDER — FENTANYL CITRATE 0.05 MG/ML IJ SOLN
50.0000 ug | INTRAMUSCULAR | Status: DC | PRN
  Administered 2013-03-15 (×2): 100 ug via INTRAVENOUS

## 2013-03-15 MED ORDER — MIDAZOLAM HCL 2 MG/2ML IJ SOLN
1.0000 mg | INTRAMUSCULAR | Status: DC | PRN
  Administered 2013-03-15: 2 mg via INTRAVENOUS

## 2013-03-15 MED ORDER — METHOCARBAMOL 100 MG/ML IJ SOLN: 500.0000 mg | Freq: Four times a day (QID) | INTRAVENOUS | Status: DC | PRN

## 2013-03-15 MED ORDER — CHLORHEXIDINE GLUCONATE 4 % EX LIQD: 60.0000 mL | Freq: Once | CUTANEOUS | Status: DC

## 2013-03-15 MED ORDER — OXYCODONE-ACETAMINOPHEN 5-325 MG PO TABS: ORAL_TABLET | ORAL | Status: DC

## 2013-03-15 MED ORDER — CLINDAMYCIN PHOSPHATE 900 MG/50ML IV SOLN
900.0000 mg | INTRAVENOUS | Status: AC
  Administered 2013-03-15: 900 mg via INTRAVENOUS

## 2013-03-15 MED ORDER — OXYCODONE HCL 5 MG PO TABS
5.0000 mg | ORAL_TABLET | Freq: Once | ORAL | Status: AC | PRN
  Administered 2013-03-15: 5 mg via ORAL

## 2013-03-15 MED ORDER — OXYCODONE HCL 5 MG/5ML PO SOLN: 5.0000 mg | Freq: Once | ORAL | Status: AC | PRN

## 2013-03-15 MED ORDER — OXYCODONE-ACETAMINOPHEN 5-325 MG PO TABS: 1.0000 | ORAL_TABLET | ORAL | Status: DC | PRN

## 2013-03-15 MED ORDER — METHOCARBAMOL 500 MG PO TABS: 500.0000 mg | ORAL_TABLET | Freq: Four times a day (QID) | ORAL | Status: DC

## 2013-03-15 MED ORDER — ONDANSETRON HCL 4 MG/2ML IJ SOLN
INTRAMUSCULAR | Status: DC | PRN
  Administered 2013-03-15: 4 mg via INTRAVENOUS

## 2013-03-15 MED ORDER — SODIUM CHLORIDE 0.9 % IR SOLN
Status: DC | PRN
  Administered 2013-03-15: 14:00:00

## 2013-03-15 MED ORDER — METOCLOPRAMIDE HCL 5 MG PO TABS: 5.0000 mg | ORAL_TABLET | Freq: Three times a day (TID) | ORAL | Status: DC | PRN

## 2013-03-15 MED ORDER — MIDAZOLAM HCL 2 MG/ML PO SYRP: 12.0000 mg | ORAL_SOLUTION | Freq: Once | ORAL | Status: DC | PRN

## 2013-03-15 MED ORDER — HYDROMORPHONE HCL PF 1 MG/ML IJ SOLN: 0.5000 mg | INTRAMUSCULAR | Status: DC | PRN

## 2013-03-15 MED ORDER — ONDANSETRON HCL 4 MG/2ML IJ SOLN: 4.0000 mg | Freq: Four times a day (QID) | INTRAMUSCULAR | Status: DC | PRN

## 2013-03-15 MED ORDER — PROPOFOL 10 MG/ML IV BOLUS
INTRAVENOUS | Status: DC | PRN
  Administered 2013-03-15: 150 mg via INTRAVENOUS

## 2013-03-15 SURGICAL SUPPLY — 66 items
APL SKNCLS STERI-STRIP NONHPOA (GAUZE/BANDAGES/DRESSINGS) ×1
BANDAGE ELASTIC 6 VELCRO ST LF (GAUZE/BANDAGES/DRESSINGS) ×2 IMPLANT
BANDAGE ESMARK 6X9 LF (GAUZE/BANDAGES/DRESSINGS) IMPLANT
BENZOIN TINCTURE PRP APPL 2/3 (GAUZE/BANDAGES/DRESSINGS) ×2 IMPLANT
BLADE CUTTER GATOR 3.5 (BLADE) ×1 IMPLANT
BLADE CUTTER MENIS 5.5 (BLADE) IMPLANT
BLADE GREAT WHITE 4.2 (BLADE) ×1 IMPLANT
BLADE SURG 15 STRL LF DISP TIS (BLADE) IMPLANT
BLADE SURG 15 STRL SS (BLADE) ×2
BNDG CMPR 9X6 STRL LF SNTH (GAUZE/BANDAGES/DRESSINGS) ×1
BNDG ESMARK 6X9 LF (GAUZE/BANDAGES/DRESSINGS) ×2
CANISTER OMNI JUG 16 LITER (MISCELLANEOUS) ×2 IMPLANT
CANISTER SUCTION 2500CC (MISCELLANEOUS) IMPLANT
CHLORAPREP W/TINT 26ML (MISCELLANEOUS) ×1 IMPLANT
CLOTH BEACON ORANGE TIMEOUT ST (SAFETY) ×2 IMPLANT
CUFF TOURNIQUET SINGLE 34IN LL (TOURNIQUET CUFF) ×1 IMPLANT
CUTTER KNOT PUSHER 2-0 FIBERWI (INSTRUMENTS) IMPLANT
CUTTER MENISCUS  4.2MM (BLADE)
CUTTER MENISCUS 4.2MM (BLADE) IMPLANT
DRAPE ARTHROSCOPY W/POUCH 90 (DRAPES) ×2 IMPLANT
DRAPE INCISE IOBAN 66X45 STRL (DRAPES) ×1 IMPLANT
DRAPE U-SHAPE 47X51 STRL (DRAPES) ×2 IMPLANT
DURAPREP 26ML APPLICATOR (WOUND CARE) ×1 IMPLANT
ELECT REM PT RETURN 9FT ADLT (ELECTROSURGICAL) ×2
ELECTRODE REM PT RTRN 9FT ADLT (ELECTROSURGICAL) IMPLANT
GLOVE BIO SURGEON STRL SZ7.5 (GLOVE) ×1 IMPLANT
GLOVE BIOGEL PI IND STRL 7.0 (GLOVE) ×1 IMPLANT
GLOVE BIOGEL PI IND STRL 7.5 (GLOVE) ×1 IMPLANT
GLOVE BIOGEL PI IND STRL 8 (GLOVE) IMPLANT
GLOVE BIOGEL PI INDICATOR 7.0 (GLOVE) ×1
GLOVE BIOGEL PI INDICATOR 7.5 (GLOVE) ×1
GLOVE BIOGEL PI INDICATOR 8 (GLOVE) ×1
GLOVE SKINSENSE NS SZ7.5 (GLOVE) ×2
GLOVE SKINSENSE STRL SZ7.5 (GLOVE) IMPLANT
GLOVE SS BIOGEL STRL SZ 7.5 (GLOVE) ×2 IMPLANT
GLOVE SUPERSENSE BIOGEL SZ 7.5 (GLOVE)
GOWN PREVENTION PLUS XLARGE (GOWN DISPOSABLE) ×4 IMPLANT
HOLDER KNEE FOAM BLUE (MISCELLANEOUS) ×2 IMPLANT
IMMOBILIZER KNEE 22 UNIV (SOFTGOODS) ×1 IMPLANT
IMMOBILIZER KNEE 24 THIGH 36 (MISCELLANEOUS) IMPLANT
IMMOBILIZER KNEE 24 UNIV (MISCELLANEOUS)
NDL SUT 6 .5 CRC .975X.05 MAYO (NEEDLE) IMPLANT
NEEDLE MAYO TAPER (NEEDLE)
NEEDLE MENISCAL REPAIR DBL ARM (NEEDLE) IMPLANT
PACK ARTHROSCOPY DSU (CUSTOM PROCEDURE TRAY) ×2 IMPLANT
PACK BASIN DAY SURGERY FS (CUSTOM PROCEDURE TRAY) ×2 IMPLANT
PENCIL BUTTON HOLSTER BLD 10FT (ELECTRODE) ×3 IMPLANT
SET ARTHROSCOPY TUBING (MISCELLANEOUS) ×2
SET ARTHROSCOPY TUBING LN (MISCELLANEOUS) ×1 IMPLANT
SHEET MEDIUM DRAPE 40X70 STRL (DRAPES) ×1 IMPLANT
SLEEVE SCD COMPRESS KNEE MED (MISCELLANEOUS) ×1 IMPLANT
SPONGE GAUZE 4X4 12PLY (GAUZE/BANDAGES/DRESSINGS) ×2 IMPLANT
SPONGE LAP 4X18 X RAY DECT (DISPOSABLE) ×1 IMPLANT
STRIP CLOSURE SKIN 1/2X4 (GAUZE/BANDAGES/DRESSINGS) ×2 IMPLANT
SUT FIBERWIRE #2 38 T-5 BLUE (SUTURE) ×2
SUT MNCRL AB 4-0 PS2 18 (SUTURE) IMPLANT
SUT VIC AB 0 CT1 18XCR BRD 8 (SUTURE) ×4 IMPLANT
SUT VIC AB 0 CT1 8-18 (SUTURE)
SUT VIC AB 0 SH 27 (SUTURE) ×3 IMPLANT
SUT VIC AB 2-0 SH 18 (SUTURE) ×3 IMPLANT
SUT VIC AB 2-0 SH 27 (SUTURE) ×2
SUT VIC AB 2-0 SH 27XBRD (SUTURE) IMPLANT
SUTURE FIBERWR #2 38 T-5 BLUE (SUTURE) IMPLANT
TOWEL OR 17X24 6PK STRL BLUE (TOWEL DISPOSABLE) ×2 IMPLANT
WAND STAR VAC 90 (SURGICAL WAND) ×1 IMPLANT
YANKAUER SUCT BULB TIP NO VENT (SUCTIONS) ×1 IMPLANT

## 2013-03-15 NOTE — Brief Op Note (Signed)
03/15/2013  2:57 PM  PATIENT:  Tammy Chapman  22 y.o. female  PRE-OPERATIVE DIAGNOSIS:  RIGHT KNEE LOOSE BODY - OSTEOCHONDRAL FRAGMENT - SPRAIN/STRAIN/TEAR MEDIAL COLLATERAL LIGAMENT 717.6, 844.1  POST-OPERATIVE DIAGNOSIS:  RIGHT KNEE LOOSE BODY - OSTEOCHONDRAL FRAGMENT - SPRAIN/STRAIN/TEAR MEDIAL COLLATERAL LIGAMENT 717.6, 844.1  PROCEDURE:  Procedure(s): RIGHT  KNEE ARTHROSCOPY, FEMORAL PATELLA REEFING, LATERAL RELEASE, PARTIAL LATERAL MENISCECTOMY  (Right)  SURGEON:  Surgeon(s) and Role:    * Nilda Simmer, MD - Primary  PHYSICIAN ASSISTANT: Margart Sickles, PA-C  ASSISTANTS:   ANESTHESIA:   local, regional and general  EBL:  Total I/O In: 1500 [I.V.:1500] Out: -   BLOOD ADMINISTERED:none  DRAINS: none   LOCAL MEDICATIONS USED:  MARCAINE     SPECIMEN:  No Specimen  DISPOSITION OF SPECIMEN:  N/A  COUNTS:  YES  TOURNIQUET:   Total Tourniquet Time Documented: Thigh (Right) - 62 minutes Total: Thigh (Right) - 62 minutes   DICTATION: .Other Dictation: Dictation Number unknown  PLAN OF CARE: Discharge to home after PACU  PATIENT DISPOSITION:  PACU - hemodynamically stable.   Delay start of Pharmacological VTE agent (>24hrs) due to surgical blood loss or risk of bleeding: not applicable

## 2013-03-15 NOTE — H&P (Signed)
Tammy Chapman is an 22 y.o. female.   Chief Complaint: right knee recurrent patellar dislocations HPI: patient seen over the last year in our outpatient office with recurrent patellar dislocations x3.  Previously subluxed and dislocated.  She has used patellar brace.  Has undergone MRI on Mar 29, 2012 right knee showing edema in the lateral femoral condyle consistent with bone contusion, consistent with transient patella dislocation.  She did not have definite medial patellofemoral ligament tear at the time, but has had recurrent instability.  Pain on a daily basis.  Takes aleve and uses shields brace.     Past Medical History  Diagnosis Date  . GERD (gastroesophageal reflux disease)   . Loose body of right knee 02/2013  . Sprain and strain of medial collateral ligament of knee 02/2013    right  . Mosquito bite 03/08/2013    Past Surgical History  Procedure Laterality Date  . Esophagogastroduodenoscopy  05/15/2011    Procedure: ESOPHAGOGASTRODUODENOSCOPY (EGD);  Surgeon: Malissa Hippo, MD;  Location: [facility];  Service: Endoscopy;  Laterality: N/A;  3:15   . Cholecystectomy  01/20/2008  . Tympanostomy tube placement  1999, 2011  . Tonsillectomy and adenoidectomy  1999  . Ear tube removal  2002    No family history on file. Social History:  reports that she has never smoked. She has never used smokeless tobacco. She reports that she does not drink alcohol or use illicit drugs.  Allergies:  Allergies  Allergen Reactions  . Benadryl (Diphenhydramine Hcl) Shortness Of Breath  . Penicillins Shortness Of Breath  . Adhesive (Tape) Hives  . Lactose Intolerance (Gi) Diarrhea and Nausea And Vomiting  . Latex Rash  . Sulfa Antibiotics Rash  . Ultram (Tramadol Hcl) Rash     (Not in a hospital admission)  No results found for this or any previous visit (from the past 48 hour(s)). No results found.  Review of Systems  Constitutional: Negative.   HENT: Negative for hearing loss, ear  pain, nosebleeds, congestion, sore throat, tinnitus and ear discharge.   Eyes: Negative.   Respiratory: Negative.  Negative for stridor.   Cardiovascular: Negative.   Gastrointestinal: Negative.   Genitourinary: Negative.   Musculoskeletal: Positive for joint pain. Negative for falls.  Skin: Negative.   Neurological: Positive for headaches. Negative for dizziness, tingling, tremors, sensory change, speech change, focal weakness, seizures and loss of consciousness.  Endo/Heme/Allergies: Negative.   Psychiatric/Behavioral: Positive for depression.    Last menstrual period 03/01/2013. Physical Exam  Constitutional: She is oriented to person, place, and time. She appears well-developed and well-nourished. No distress.  HENT:  Head: Normocephalic and atraumatic.  Nose: Nose normal.  Eyes: Conjunctivae and EOM are normal. Pupils are equal, round, and reactive to light.  Neck: Normal range of motion. Neck supple.  Cardiovascular: Normal rate, regular rhythm and intact distal pulses.   Respiratory: Effort normal and breath sounds normal. No respiratory distress.  GI: Soft. Bowel sounds are normal. She exhibits no distension. There is no tenderness.  Musculoskeletal:       Right knee: She exhibits decreased range of motion, effusion and abnormal patellar mobility. She exhibits normal alignment, no LCL laxity and no MCL laxity. Tenderness found.  Lymphadenopathy:    She has no cervical adenopathy.  Neurological: She is alert and oriented to person, place, and time. No cranial nerve deficit.  Skin: Skin is warm and dry. No rash noted. No erythema.  Psychiatric: She has a normal mood and affect.  Assessment/Plan right knee recurrent patellar dislocations  Risks and benefits of right knee arthroscopy discussed with possible loose body excision vs chondroplasty, medial patellofemoral ligament reefing with repair, possible lateral release patient wishes to proceed.  This will be set up as  outpatient surgery.  Margart Sickles 03/15/2013, 10:15 AM

## 2013-03-15 NOTE — Anesthesia Preprocedure Evaluation (Signed)
Anesthesia Evaluation  Patient identified by MRN, date of birth, ID band Patient awake    Reviewed: Allergy & Precautions, H&P , NPO status , Patient's Chart, lab work & pertinent test results  Airway Mallampati: II TM Distance: >3 FB Neck ROM: Full    Dental no notable dental hx. (+) Teeth Intact and Dental Advisory Given   Pulmonary neg pulmonary ROS,  breath sounds clear to auscultation  Pulmonary exam normal       Cardiovascular negative cardio ROS  Rhythm:Regular Rate:Normal     Neuro/Psych negative neurological ROS  negative psych ROS   GI/Hepatic Neg liver ROS, GERD-  Medicated and Controlled,  Endo/Other  negative endocrine ROS  Renal/GU negative Renal ROS  negative genitourinary   Musculoskeletal   Abdominal   Peds  Hematology negative hematology ROS (+)   Anesthesia Other Findings   Reproductive/Obstetrics negative OB ROS                           Anesthesia Physical Anesthesia Plan  ASA: II  Anesthesia Plan: General   Post-op Pain Management:    Induction: Intravenous  Airway Management Planned: LMA  Additional Equipment:   Intra-op Plan:   Post-operative Plan: Extubation in OR  Informed Consent: I have reviewed the patients History and Physical, chart, labs and discussed the procedure including the risks, benefits and alternatives for the proposed anesthesia with the patient or authorized representative who has indicated his/her understanding and acceptance.   Dental advisory given  Plan Discussed with: CRNA  Anesthesia Plan Comments:         Anesthesia Quick Evaluation

## 2013-03-15 NOTE — Progress Notes (Signed)
Assisted Dr. Fitzgerald with right, knee block. Side rails up, monitors on throughout procedure. See vital signs in flow sheet. Tolerated Procedure well. 

## 2013-03-15 NOTE — Transfer of Care (Signed)
Immediate Anesthesia Transfer of Care Note  Patient: Tammy Chapman  Procedure(s) Performed: Procedure(s): RIGHT  KNEE ARTHROSCOPY, FEMORAL PATELLA REEFING, LATERAL RELEASE, PARTIAL LATERAL MENISCECTOMY  (Right)  Patient Location: PACU  Anesthesia Type:GA combined with regional for post-op pain  Level of Consciousness: awake, alert  and patient cooperative  Airway & Oxygen Therapy: Patient Spontanous Breathing and Patient connected to face mask oxygen  Post-op Assessment: Report given to PACU RN, Post -op Vital signs reviewed and stable and Patient moving all extremities  Post vital signs: Reviewed and stable  Complications: No apparent anesthesia complications

## 2013-03-15 NOTE — Interval H&P Note (Signed)
History and Physical Interval Note:  03/15/2013 1:20 PM  Tammy Chapman  has presented today for surgery, with the diagnosis of RIGHT KNEE LOOSE BODY - OSTEOCHONDRAL FRAGMENT - SPRAIN/STRAIN/TEAR MEDIAL COLLATERAL LIGAMENT 717.6, 844.1  The various methods of treatment have been discussed with the patient and family. After consideration of risks, benefits and other options for treatment, the patient has consented to  Procedure(s): RIGHT ARTHROSCOPY KNEE LOOSE BODY REMOVAL, PATELLA FEMORAL LIGAMENT REPAIR (Right) as a surgical intervention .  The patient's history has been reviewed, patient examined, no change in status, stable for surgery.  I have reviewed the patient's chart and labs.  Questions were answered to the patient's satisfaction.     Salvatore Marvel A

## 2013-03-15 NOTE — H&P (View-Only) (Signed)
Tammy Chapman is an 22 y.o. female.   Chief Complaint: right knee recurrent patellar dislocations HPI: patient seen over the last year in our outpatient office with recurrent patellar dislocations x3.  Previously subluxed and dislocated.  She has used patellar brace.  Has undergone MRI on Mar 29, 2012 right knee showing edema in the lateral femoral condyle consistent with bone contusion, consistent with transient patella dislocation.  She did not have definite medial patellofemoral ligament tear at the time, but has had recurrent instability.  Pain on a daily basis.  Takes aleve and uses shields brace.     Past Medical History  Diagnosis Date  . GERD (gastroesophageal reflux disease)   . Loose body of right knee 02/2013  . Sprain and strain of medial collateral ligament of knee 02/2013    right  . Mosquito bite 03/08/2013    Past Surgical History  Procedure Laterality Date  . Esophagogastroduodenoscopy  05/15/2011    Procedure: ESOPHAGOGASTRODUODENOSCOPY (EGD);  Surgeon: Najeeb U Rehman, MD;  Location: [facility];  Service: Endoscopy;  Laterality: N/A;  3:15   . Cholecystectomy  01/20/2008  . Tympanostomy tube placement  1999, 2011  . Tonsillectomy and adenoidectomy  1999  . Ear tube removal  2002    No family history on file. Social History:  reports that she has never smoked. She has never used smokeless tobacco. She reports that she does not drink alcohol or use illicit drugs.  Allergies:  Allergies  Allergen Reactions  . Benadryl (Diphenhydramine Hcl) Shortness Of Breath  . Penicillins Shortness Of Breath  . Adhesive (Tape) Hives  . Lactose Intolerance (Gi) Diarrhea and Nausea And Vomiting  . Latex Rash  . Sulfa Antibiotics Rash  . Ultram (Tramadol Hcl) Rash     (Not in a hospital admission)  No results found for this or any previous visit (from the past 48 hour(s)). No results found.  Review of Systems  Constitutional: Negative.   HENT: Negative for hearing loss, ear  pain, nosebleeds, congestion, sore throat, tinnitus and ear discharge.   Eyes: Negative.   Respiratory: Negative.  Negative for stridor.   Cardiovascular: Negative.   Gastrointestinal: Negative.   Genitourinary: Negative.   Musculoskeletal: Positive for joint pain. Negative for falls.  Skin: Negative.   Neurological: Positive for headaches. Negative for dizziness, tingling, tremors, sensory change, speech change, focal weakness, seizures and loss of consciousness.  Endo/Heme/Allergies: Negative.   Psychiatric/Behavioral: Positive for depression.    Last menstrual period 03/01/2013. Physical Exam  Constitutional: She is oriented to person, place, and time. She appears well-developed and well-nourished. No distress.  HENT:  Head: Normocephalic and atraumatic.  Nose: Nose normal.  Eyes: Conjunctivae and EOM are normal. Pupils are equal, round, and reactive to light.  Neck: Normal range of motion. Neck supple.  Cardiovascular: Normal rate, regular rhythm and intact distal pulses.   Respiratory: Effort normal and breath sounds normal. No respiratory distress.  GI: Soft. Bowel sounds are normal. She exhibits no distension. There is no tenderness.  Musculoskeletal:       Right knee: She exhibits decreased range of motion, effusion and abnormal patellar mobility. She exhibits normal alignment, no LCL laxity and no MCL laxity. Tenderness found.  Lymphadenopathy:    She has no cervical adenopathy.  Neurological: She is alert and oriented to person, place, and time. No cranial nerve deficit.  Skin: Skin is warm and dry. No rash noted. No erythema.  Psychiatric: She has a normal mood and affect.       Assessment/Plan right knee recurrent patellar dislocations  Risks and benefits of right knee arthroscopy discussed with possible loose body excision vs chondroplasty, medial patellofemoral ligament reefing with repair, possible lateral release patient wishes to proceed.  This will be set up as  outpatient surgery.  Tammy Chapman 03/15/2013, 10:15 AM    

## 2013-03-15 NOTE — Anesthesia Postprocedure Evaluation (Signed)
  Anesthesia Post-op Note  Patient: Tammy Chapman  Procedure(s) Performed: Procedure(s): RIGHT  KNEE ARTHROSCOPY, FEMORAL PATELLA REEFING, LATERAL RELEASE, PARTIAL LATERAL MENISCECTOMY  (Right)  Patient Location: PACU  Anesthesia Type:General  Level of Consciousness: awake, alert  and oriented  Airway and Oxygen Therapy: Patient Spontanous Breathing  Post-op Pain: none  Post-op Assessment: Post-op Vital signs reviewed, Patient's Cardiovascular Status Stable, Respiratory Function Stable, Patent Airway and No signs of Nausea or vomiting  Post-op Vital Signs: Reviewed and stable  Complications: No apparent anesthesia complications

## 2013-03-15 NOTE — Anesthesia Procedure Notes (Signed)
Procedure Name: LMA Insertion Date/Time: 03/15/2013 1:32 PM Performed by: Suann Larry WOLFE Pre-anesthesia Checklist: Patient identified, Emergency Drugs available, Suction available and Patient being monitored Patient Re-evaluated:Patient Re-evaluated prior to inductionOxygen Delivery Method: Circle System Utilized Preoxygenation: Pre-oxygenation with 100% oxygen Intubation Type: IV induction Ventilation: Mask ventilation without difficulty LMA: LMA inserted LMA Size: 4.0 Number of attempts: 1 Airway Equipment and Method: bite block Placement Confirmation: positive ETCO2 and breath sounds checked- equal and bilateral Tube secured with: Tape Dental Injury: Teeth and Oropharynx as per pre-operative assessment

## 2013-03-16 ENCOUNTER — Encounter (HOSPITAL_BASED_OUTPATIENT_CLINIC_OR_DEPARTMENT_OTHER): Payer: Self-pay | Admitting: Orthopedic Surgery

## 2013-03-16 NOTE — Op Note (Deleted)
NAME:  Tammy Chapman, Tammy Chapman              ACCOUNT NO.:  [id number]  MEDICAL RECORD NO.:  [id number]  LOCATION:  [facility]                         FACILITY:  [facility]  PHYSICIAN:  Elana Alm. Thurston Hole, M.D. DATE OF BIRTH:  03-14-1991  DATE OF PROCEDURE:  03/15/2013 DATE OF DISCHARGE:  03/15/2013                              OPERATIVE REPORT   PREOPERATIVE DIAGNOSES: 1. Right knee lateral meniscus tear. 2. 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  POSTOPERATIVE DIAGNOSES: 1. Right knee lateral meniscus tear. 2. 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  PROCEDURE: 1. Right knee examination under anaesthesia, followed by arthroscopic     partial lateral meniscectomy. 2. Right knee medial patellofemoral ligament reefing and repair. 3. Right knee lateral retinacular release.  SURGEON:  Elana Alm. Thurston Hole, M.D.  ASSISTANT:  Margart Sickles, PA-C.  ANESTHESIA:  General.  OPERATIVE TIME:  45 minutes.  COMPLICATIONS:  None.  INDICATION FOR PROCEDURE:  Tammy Chapman is a 22 year old who has had a painful recurrent right knee patellar instability with multiple patella dislocations and subluxations with exam and MRI documenting incompetence and stretching, significant looseness in the medial patellofemoral ligament, retinacular complex, as well as a possible lateral meniscus tear and possible loose bodies.  She has failed conservative care and is now to undergo arthroscopy and repair.  DESCRIPTION:  Tammy Chapman was brought to the operating room on March 15, 2013, after knee block was placed in the holding room by Anesthesia. She was placed on the operative table in supine position.  She received antibiotics preoperatively for prophylaxis.  After being placed under general anesthesia, her right knee was examined.  She had full range of motion.  Knee was stable.  Ligamentous exam except for the patellofemoral joint which showed significant patellar  subluxation laterally.  She had full range of motion.  The right leg was prepped using sterile DuraPrep and draped using sterile technique.  Time-out procedure was called and the correct right knee identified.  Right leg was exsanguinated and tourniquet elevated to 325 mm.  Initially, the arthroscopy was performed through an anterolateral portal, the arthroscope with a pump attached was placed into an anteromedial portal, and arthroscopic probe was placed.  On initial inspection of the medial compartment, the articular cartilage was normal.  Medial meniscus was normal.  The intercondylar notch inspected.  The anterior and posterior cruciate ligaments were normal.  Lateral compartment inspected.  The articular cartilage was normal.  Lateral meniscus showed a partial tear of 20% posterolateral corner, which was resected back to a stable rim. Patellofemoral joint showed significant lateral patellar tracking, subluxation, and lateral patellar tilt.  There was a large thickened medial plica pain and lateral synovitis which was debrided.  Medial and lateral gutters were otherwise free of pathology.  The lateral retinacular release was carried out with an ArthroCare wand and it helped by about 50% of the lateral patellar subluxation and tilt tracking.  At this point, then a 3-4 cm medial incision was made in line with a medial portal over the medial patellofemoral ligament and retinacular complex.  The underlying subcutaneous tissues were incised along with skin incision.  The fascia over the medial patellofemoral ligament was incised longitudinally.  The medial patellofemoral ligament was found to be extremely lax and patulous and it was incised longitudinally, keeping the synovium intact.  A pants-over-vest reefing and repair and a set of sutures were then placed using 3 separate #2 FiberWire mattress sutures, thus reefing and tightening, and repairing the medial patellofemoral ligament and  retinacular complex.  After these were tied down, patellofemoral tracking was again evaluated and now found to be normal, but not over tightened.  The fascia over this repair was then also repaired with reefing of the fascia using interrupted 0 Vicryl mattress sutures.  Subcutaneous tissues were then closed with 0 and 2-0 Vicryl, subcuticular layer closed with 4-0 Prolene. Arthroscopic portal closed with 4-0 Prolene.  The knee injected with 0.25% Marcaine.  Sterile dressings were applied in a long-leg splint. Tourniquet was released, and the patient awakened and taken to recovery room in stable condition.  Needle and sponge counts correct x2 at the end of the case.  FOLLOWUP CARE:  Alzena will be followed as an outpatient on Norco and Flexeril.  Seen back in office in a week for wound check and followup.     Kmarion Rawl A. Thurston Hole, M.D.     RAW/MEDQ  D:  03/15/2013  T:  03/16/2013  Job:  [id number]

## 2013-03-16 NOTE — Op Note (Signed)
NAME:  Tammy Chapman, Arley A              ACCOUNT NO.:  [id number]  MEDICAL RECORD NO.:  [id number]  LOCATION:  [facility]                         FACILITY:  [facility]  PHYSICIAN:  Bodee Lafoe A. Djon Tith, M.D. DATE OF BIRTH:  08/06/1991  DATE OF PROCEDURE:  03/15/2013 DATE OF DISCHARGE:  03/15/2013                              OPERATIVE REPORT   PREOPERATIVE DIAGNOSES: 1. Right knee lateral meniscus tear. 2. 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  POSTOPERATIVE DIAGNOSES: 1. Right knee lateral meniscus tear. 2. 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  PROCEDURE: 1. Right knee examination under anaesthesia, followed by arthroscopic     partial lateral meniscectomy. 2. Right knee medial patellofemoral ligament reefing and repair. 3. Right knee lateral retinacular release.  SURGEON:  Ta Fair A. Sheilah Rayos, M.D.  ASSISTANT:  Joshua Chadwell, PA-C.  ANESTHESIA:  General.  OPERATIVE TIME:  45 minutes.  COMPLICATIONS:  None.  INDICATION FOR PROCEDURE:  Ms. Tammy Chapman is a 22-year-old who has had a painful recurrent right knee patellar instability with multiple patella dislocations and subluxations with exam and MRI documenting incompetence and stretching, significant looseness in the medial patellofemoral ligament, retinacular complex, as well as a possible lateral meniscus tear and possible loose bodies.  She has failed conservative care and is now to undergo arthroscopy and repair.  DESCRIPTION:  Ms. Tammy Chapman was brought to the operating room on March 15, 2013, after knee block was placed in the holding room by Anesthesia. She was placed on the operative table in supine position.  She received antibiotics preoperatively for prophylaxis.  After being placed under general anesthesia, her right knee was examined.  She had full range of motion.  Knee was stable.  Ligamentous exam except for the patellofemoral joint which showed significant patellar  subluxation laterally.  She had full range of motion.  The right leg was prepped using sterile DuraPrep and draped using sterile technique.  Time-out procedure was called and the correct right knee identified.  Right leg was exsanguinated and tourniquet elevated to 325 mm.  Initially, the arthroscopy was performed through an anterolateral portal, the arthroscope with a pump attached was placed into an anteromedial portal, and arthroscopic probe was placed.  On initial inspection of the medial compartment, the articular cartilage was normal.  Medial meniscus was normal.  The intercondylar notch inspected.  The anterior and posterior cruciate ligaments were normal.  Lateral compartment inspected.  The articular cartilage was normal.  Lateral meniscus showed a partial tear of 20% posterolateral corner, which was resected back to a stable rim. Patellofemoral joint showed significant lateral patellar tracking, subluxation, and lateral patellar tilt.  There was a large thickened medial plica pain and lateral synovitis which was debrided.  Medial and lateral gutters were otherwise free of pathology.  The lateral retinacular release was carried out with an ArthroCare wand and it helped by about 50% of the lateral patellar subluxation and tilt tracking.  At this point, then a 3-4 cm medial incision was made in line with a medial portal over the medial patellofemoral ligament and retinacular complex.  The underlying subcutaneous tissues were incised along with skin incision.  The fascia over the medial patellofemoral ligament was incised longitudinally.    The medial patellofemoral ligament was found to be extremely lax and patulous and it was incised longitudinally, keeping the synovium intact.  A pants-over-vest reefing and repair and a set of sutures were then placed using 3 separate #2 FiberWire mattress sutures, thus reefing and tightening, and repairing the medial patellofemoral ligament and  retinacular complex.  After these were tied down, patellofemoral tracking was again evaluated and now found to be normal, but not over tightened.  The fascia over this repair was then also repaired with reefing of the fascia using interrupted 0 Vicryl mattress sutures.  Subcutaneous tissues were then closed with 0 and 2-0 Vicryl, subcuticular layer closed with 4-0 Prolene. Arthroscopic portal closed with 4-0 Prolene.  The knee injected with 0.25% Marcaine.  Sterile dressings were applied in a long-leg splint. Tourniquet was released, and the patient awakened and taken to recovery room in stable condition.  Needle and sponge counts correct x2 at the end of the case.  FOLLOWUP CARE:  Carreen will be followed as an outpatient on Norco and Flexeril.  Seen back in office in a week for wound check and followup.     Londynn Sonoda A. Tiffony Kite, M.D.     RAW/MEDQ  D:  03/15/2013  T:  03/16/2013  Job:  [id number] 

## 2013-04-06 ENCOUNTER — Ambulatory Visit (HOSPITAL_COMMUNITY): Payer: 59 | Admitting: Physical Therapy

## 2013-04-07 ENCOUNTER — Ambulatory Visit (HOSPITAL_COMMUNITY)
Admission: RE | Admit: 2013-04-07 | Discharge: 2013-04-07 | Disposition: A | Discharge status: Home / Self Care | Payer: 59 | Source: Ambulatory Visit | Attending: Orthopedic Surgery | Admitting: Orthopedic Surgery

## 2013-04-07 DIAGNOSIS — IMO0001 Reserved for inherently not codable concepts without codable children: Secondary | ICD-10-CM | POA: Insufficient documentation

## 2013-04-07 DIAGNOSIS — M25569 Pain in unspecified knee: Secondary | ICD-10-CM | POA: Insufficient documentation

## 2013-04-07 DIAGNOSIS — R262 Difficulty in walking, not elsewhere classified: Secondary | ICD-10-CM | POA: Insufficient documentation

## 2013-04-07 DIAGNOSIS — M25669 Stiffness of unspecified knee, not elsewhere classified: Secondary | ICD-10-CM | POA: Insufficient documentation

## 2013-04-07 NOTE — Evaluation (Signed)
Physical Therapy Evaluation  Patient Details  Name: Tammy Chapman MRN: [id number] Date of Birth: [birth year]  Today's Date: 04/07/2013 Time: 1345-1430 PT Time Calculation (min): 45 min Charge: Eval             Visit#: 1 of 16  Re-eval: 05/07/13 Assessment Diagnosis: patella femoral repain Surgical Date: 03/15/13 Next MD Visit: 04/12/2013 Prior Therapy: none  Authorization: UMR    Past Medical History:  Past Medical History  Diagnosis Date  . GERD (gastroesophageal reflux disease)   . Loose body of right knee 02/2013  . Sprain and strain of medial collateral ligament of knee 02/2013    right  . Mosquito bite 03/08/2013   Past Surgical History:  Past Surgical History  Procedure Laterality Date  . Esophagogastroduodenoscopy  05/15/2011    Procedure: ESOPHAGOGASTRODUODENOSCOPY (EGD);  Surgeon: Malissa Hippo, MD;  Location: [facility];  Service: Endoscopy;  Laterality: N/A;  3:15   . Cholecystectomy  01/20/2008  . Tympanostomy tube placement  1999, 2011  . Tonsillectomy and adenoidectomy  1999  . Ear tube removal  2002  . Knee arthroscopy Right 03/15/2013    Procedure: RIGHT  KNEE ARTHROSCOPY, FEMORAL PATELLA REEFING, LATERAL RELEASE, PARTIAL LATERAL MENISCECTOMY ;  Surgeon: Nilda Simmer, MD;  Location: [facility];  Service: Orthopedics;  Laterality: Right;    Subjective Symptoms/Limitations Symptoms: Pt states that she had surgery on 03/15/2013.  She is currently in a leg brace which is blocked at 30 degree flextion but she has very poor quad control at this time.   Pain Assessment Currently in Pain?: No/denies  Precautions/Restrictions   slow ROM no aggressive PROM  Balance Screening  no falls  Prior Functioning  Prior Function Vocation: Part time employment Leisure: Hobbies-yes (Comment) Comments: going to gym; ;hiking  Cognition/Observation Cognition Overall Cognitive Status: Within Functional Limits for tasks assessed     Assessment RLE  AROM (degrees) Right Knee Extension: 8 Right Knee Flexion: 60 RLE Strength Right Hip Flexion: 2/5 Right Hip Extension: 4/5 Right Hip ABduction: 4/5 Right Hip ADduction: 3/5 Right Knee Flexion: 3+/5 Right Knee Extension: 2-/5  Exercise/Treatment  HEP   Physical Therapy Assessment and Plan PT Assessment and Plan Clinical Impression Statement: Pt is a 22 yo female who underwent a patellofemoral ligament repair on 03/15/2013.  Pt will benefit from skilled PT to improve ROM, strength and return pt to prior functional level. Pt will benefit from skilled therapeutic intervention in order to improve on the following deficits: Difficulty walking;Decreased range of motion;Decreased strength;Decreased activity tolerance Rehab Potential: Good PT Frequency: Min 2X/week PT Duration: 8 weeks PT Treatment/Interventions: Gait training;Therapeutic activities;Therapeutic exercise;Manual techniques;Modalities;Patient/family education PT Plan: Pt may begin PRE's for abduction, and extension of the hip; light load leg press; light cable walk, TKE, wall sits, SLS; next  level begins on 9/10 please see protocol scanned in pt chart    Goals Home Exercise Program Pt/caregiver will Perform Home Exercise Program: For increased ROM;For increased strengthening PT Goal: Perform Home Exercise Program - Progress: Goal set today PT Short Term Goals Time to Complete Short Term Goals: 2 weeks PT Short Term Goal 1: Pt to be able to complete terminal extension on own to show improved quad control to that brace may increase in flexion for ease of sitting PT Long Term Goals Time to Complete Long Term Goals: 8 weeks PT Long Term Goal 1: Pt I in advance HEP  PT Long Term Goal 2: Pt strength to improve one grade to  allow pt to begin weaning from brace Long Term Goal 3: ROM to 0-120 to allow pt to squat to pick items off the floor Long Term Goal 4: Pt to be going back to the gym for workouts for improved health  habits.  Problem List Patient Active Problem List   Diagnosis Date Noted  . Knee pain 04/08/2012  . Knee stiffness 04/08/2012  . Difficulty in walking 04/08/2012  . Abnormality of gait 04/08/2012  . GERD (gastroesophageal reflux disease) 08/04/2011  . Abdominal pain 08/04/2011  . Diarrhea 08/04/2011    PT Plan of Care PT Home Exercise Plan: given  GP    RUSSELL,CINDY 04/07/2013, 5:07 PM  Physician Documentation Your signature is required to indicate approval of the treatment plan as stated above.  Please sign and either send electronically or make a copy of this report for your files and return this physician signed original.   Please mark one 1.__approve of plan  2. ___approve of plan with the following conditions.   ______________________________                                                          _____________________ Physician Signature                                                                                                             Date

## 2013-04-12 ENCOUNTER — Ambulatory Visit (HOSPITAL_COMMUNITY)
Admission: RE | Admit: 2013-04-12 | Discharge: 2013-04-12 | Disposition: A | Discharge status: Home / Self Care | Payer: 59 | Source: Ambulatory Visit | Attending: Pulmonary Disease | Admitting: Pulmonary Disease

## 2013-04-12 NOTE — Progress Notes (Signed)
Physical Therapy Treatment Patient Details  Name: Tammy Chapman MRN: [id number] Date of Birth: 1991/01/07  Today's Date: 04/12/2013 Time: 3086-5784 PT Time Calculation (min): 45 min Visit#: 2 of 16  Re-eval: 05/07/13 Charges:  therex 42'  Subjective:  Pt states MD reported she could increase 10 degrees on her brace next week.  Currently without pain.  States she's been working on her HEP.   Precautions:  Protocol/Murphy  Probation officer for Strengthening cybex Leg Press: 2 PL bilateral LE's 15 reps Standing Wall Squat: 10 reps;5 seconds SLS: max 35" 3 trials Rt LE Other Standing Knee Exercises: TKE standing 10X5" Supine Quad Sets: 10 reps Short Arc Quad Sets: 5 reps;AAROM Heel Slides: 10 reps Straight Leg Raises: 5 reps;AAROM Sidelying Hip ABduction: 10 reps Hip ADduction: 10 reps Prone  Hamstring Curl: 10 reps Hip Extension: 10 reps      Physical Therapy Assessment and Plan PT Assessment and Plan Clinical Impression Statement: Added new standing exercises with brace on without difficulty.  Pt continues to require assistance with quad exercises due to extreme weakness.  Able to complete hip adduction in side lying today.  Encouraged pt to continue with isometric quad contractions to help increase strength,   PT Plan: Progress to PRE's for hip abduction and extension, keep leg press with lighter resistance.  Add light cable walk, and progress to wall sits.  Per pt. OK to increase to 70 degrees for brace next week (04/18/13).   Per evaluating PT, next level begins on 9/10 please see protocol scanned in pt chart    Problem List Patient Active Problem List   Diagnosis Date Noted  . Knee pain 04/08/2012  . Knee stiffness 04/08/2012  . Difficulty in walking 04/08/2012  . Abnormality of gait 04/08/2012  . GERD (gastroesophageal reflux disease) 08/04/2011  . Abdominal pain 08/04/2011  . Diarrhea 08/04/2011      Lurena Nida, PTA/CLT 04/12/2013, 1:52 PM

## 2013-04-14 ENCOUNTER — Ambulatory Visit (HOSPITAL_COMMUNITY)
Admission: RE | Admit: 2013-04-14 | Discharge: 2013-04-14 | Disposition: A | Discharge status: Home / Self Care | Payer: 59 | Source: Ambulatory Visit | Attending: Pulmonary Disease | Admitting: Pulmonary Disease

## 2013-04-14 NOTE — Progress Notes (Signed)
Physical Therapy Treatment Patient Details  Name: Tammy Chapman MRN: [id number] Date of Birth: [birth year]  Today's Date: 04/14/2013 Time: 1300-1350 PT Time Calculation (min): 50 min  Visit#: 3 of 16  Re-eval: 05/07/13 Charges: Therex x 35'(1300-1335) Estim x 10' (1338-1348)  Authorization: UMR    Subjective: Symptoms/Limitations Symptoms: Pt states that she went for a thiry minute walk this morning. Pain Assessment Currently in Pain?: No/denies   Exercise/Treatments Machines for Strengthening Total Gym Leg Press: 2 PL bilateral LE's 15 reps Standing Wall Squat: 10 reps;5 seconds SLS: RL 1' Other Standing Knee Exercises: TKE standing 10X5" Other Standing Knee Exercises: Small cord walk all directions Supine Quad Sets: 10 reps;Limitations Quad Sets Limitations: With multimodal cueing to activate distal quad Short Arc Quad Sets: 5 sets;AAROM Terminal Knee Extension: 10 reps  Modalities Modalities: Copywriter, advertising Location: Right distal quad Electrical Stimulation Action: Research officer, political party Stimulation Parameters: Russian L25 10/10 co-contraction Electrical Stimulation Goals: Strength  Physical Therapy Assessment and Plan PT Assessment and Plan Clinical Impression Statement: Pt requires multimodal cueing to facilitate distal quad contraction. Began [place] estim to distal quad to improve terminal knee extension. Pt is without complaint of increased pain throughout session. PT Plan: Progress to PRE's for hip abduction and extension. Per pt. OK to increase to 70 degrees for brace next week (04/18/13).   Per evaluating PT, next level begins on 9/10 please see protocol scanned in pt chart    Problem List Patient Active Problem List   Diagnosis Date Noted  . Knee pain 04/08/2012  . Knee stiffness 04/08/2012  . Difficulty in walking 04/08/2012  . Abnormality of gait 04/08/2012  . GERD (gastroesophageal reflux  disease) 08/04/2011  . Abdominal pain 08/04/2011  . Diarrhea 08/04/2011    PT - End of Session Activity Tolerance: Patient tolerated treatment well General Behavior During Therapy: [facility] for tasks assessed/performed  Seth Bake, PTA  04/14/2013, 4:10 PM

## 2013-04-19 ENCOUNTER — Ambulatory Visit (HOSPITAL_COMMUNITY)
Admission: RE | Admit: 2013-04-19 | Discharge: 2013-04-19 | Disposition: A | Discharge status: Home / Self Care | Payer: 59 | Source: Ambulatory Visit | Attending: Pulmonary Disease | Admitting: Pulmonary Disease

## 2013-04-19 DIAGNOSIS — M25569 Pain in unspecified knee: Secondary | ICD-10-CM | POA: Insufficient documentation

## 2013-04-19 DIAGNOSIS — IMO0001 Reserved for inherently not codable concepts without codable children: Secondary | ICD-10-CM | POA: Insufficient documentation

## 2013-04-19 DIAGNOSIS — R262 Difficulty in walking, not elsewhere classified: Secondary | ICD-10-CM | POA: Insufficient documentation

## 2013-04-19 DIAGNOSIS — M25669 Stiffness of unspecified knee, not elsewhere classified: Secondary | ICD-10-CM | POA: Insufficient documentation

## 2013-04-19 NOTE — Progress Notes (Signed)
Physical Therapy Treatment Patient Details  Name: Tammy Chapman MRN: [id number] Date of Birth: November 19, 1990  Today's Date: 04/19/2013 Time: 1345-1435 PT Time Calculation (min): 50 min Charges: Therex x 35'(1347-1422)  Estim x 10'(1425-1435)  Visit#: 4 of 16  Re-eval: 05/07/13  Authorization: UMR   Subjective: Symptoms/Limitations Symptoms: Pt states that she has been practicing SLR at home. Pain Assessment Currently in Pain?: No/denies   Exercise/Treatments Machines for Strengthening Total Gym Leg Press: 2 PL bilateral LE's 15 reps Standing Wall Squat: 10 reps;5 seconds SLS with Vectors: 5x5" Walking with Sports Cord: All directions Other Standing Knee Exercises: TKE standing 10X5" Supine Quad Sets: 10 reps Short Arc Quad Sets: 10 reps Terminal Knee Extension: 10 reps   Modalities Modalities: Copywriter, advertising Location: Right distal quad Electrical Stimulation Action: Research officer, political party Stimulation Parameters: Russian L22 10/10 co-contraction Electrical Stimulation Goals: Strength  Physical Therapy Assessment and Plan PT Assessment and Plan Clinical Impression Statement: Quad control continues to improve. Pt responds well to [place] estim displaying improved contraction when it is applied. Increased brace flexion to 70 degrees per MD. Pt is without complaint throughout session. PT Plan: Progress to PRE's for hip abduction and extension. Per evaluating PT, next level begins on 9/10 please see protocol scanned in pt chart.     Problem List Patient Active Problem List   Diagnosis Date Noted  . Knee pain 04/08/2012  . Knee stiffness 04/08/2012  . Difficulty in walking 04/08/2012  . Abnormality of gait 04/08/2012  . GERD (gastroesophageal reflux disease) 08/04/2011  . Abdominal pain 08/04/2011  . Diarrhea 08/04/2011    PT - End of Session Activity Tolerance: Patient tolerated treatment  well General Behavior During Therapy: [facility] for tasks assessed/performed  Seth Bake, PTA  04/19/2013, 3:46 PM

## 2013-04-21 ENCOUNTER — Ambulatory Visit (HOSPITAL_COMMUNITY)
Admission: RE | Admit: 2013-04-21 | Discharge: 2013-04-21 | Disposition: A | Discharge status: Home / Self Care | Payer: 59 | Source: Ambulatory Visit | Attending: Pulmonary Disease | Admitting: Pulmonary Disease

## 2013-04-21 NOTE — Progress Notes (Signed)
Physical Therapy Treatment Patient Details  Name: Tammy Chapman MRN: [id number] Date of Birth: [birth year]  Today's Date: 04/21/2013 Time: 2952-8413 PT Time Calculation (min): 47 min  Visit#: 5 of 16  Re-eval: 05/07/13 Charges: Therex x [phone]) Estim x 10'(1425-1435)  Authorization: UMR    Subjective: Symptoms/Limitations Symptoms: Pt is pain free and without complaint. Pain Assessment Currently in Pain?: No/denies   Exercise/Treatments Machines for Strengthening Total Gym Leg Press: 6 PL bilateral LE's 15 reps Standing Wall Squat: 10 reps;5 seconds SLS with Vectors: 5x5" Walking with Sports Cord: All directions Supine Short Arc Quad Sets: 10 reps Terminal Knee Extension: 10 reps Straight Leg Raises: 5 sets;AAROM   Modalities Modalities: Archivist Stimulation Location: Right distal quad Electrical Stimulation Action: Research officer, political party Stimulation Parameters: Russian L20 10/10 co-contraction Electrical Stimulation Goals: Strength  Physical Therapy Assessment and Plan PT Assessment and Plan Clinical Impression Statement: Pt completes therex well after initial cueing and demo. Pt requires vc's to improve form with wall squats. Continued [place] estim to improve distal quad contraction. Pt is without complaint throughout session. PT Plan: Progress to PRE's for hip abduction and extension. Per evaluating PT, next level begins on 9/10 please see protocol scanned in pt chart.     Problem List Patient Active Problem List   Diagnosis Date Noted  . Knee pain 04/08/2012  . Knee stiffness 04/08/2012  . Difficulty in walking 04/08/2012  . Abnormality of gait 04/08/2012  . GERD (gastroesophageal reflux disease) 08/04/2011  . Abdominal pain 08/04/2011  . Diarrhea 08/04/2011    PT - End of Session Activity Tolerance: Patient tolerated treatment well General Behavior During Therapy: [facility] for tasks  assessed/performed  Seth Bake, PTA  04/21/2013, 3:13 PM

## 2013-04-26 ENCOUNTER — Ambulatory Visit (HOSPITAL_COMMUNITY)
Admission: RE | Admit: 2013-04-26 | Discharge: 2013-04-26 | Disposition: A | Discharge status: Home / Self Care | Payer: 59 | Source: Ambulatory Visit | Attending: Pulmonary Disease | Admitting: Pulmonary Disease

## 2013-04-26 NOTE — Progress Notes (Signed)
Physical Therapy Treatment Patient Details  Name: Tammy Chapman MRN: [id number] Date of Birth: October 29, 1990  Today's Date: 04/26/2013 Time: 1305-1405 PT Time Calculation (min): 60 min Charges: Therex x [phone]) Ice x 10'(1355-1405)  Visit#: 6 of 16  Re-eval: 05/07/13  Authorization: UMR   Subjective: Symptoms/Limitations Symptoms: Pt states that MD wants to begin aggressive strengthening.  Pain Assessment Currently in Pain?: No/denies   Exercise/Treatments Machines for Strengthening Total Gym Leg Press: 4 PL RLE only 2 sets of 10 Standing Side Lunges: 10 reps;Right Lateral Step Up: 10 reps;Step Height: 4";Right Functional Squat: 10 reps;5 seconds Wall Squat: 10 reps;5 seconds Other Standing Knee Exercises: Hamstring curls 10x5" Supine Short Arc Quad Sets: 10 reps Heel Slides: 10 reps Terminal Knee Extension: 10 reps Straight Leg Raises: 10 reps  Cryotherapy: Ice pack to right knee x 10' with BLE elevated   Physical Therapy Assessment and Plan PT Assessment and Plan Clinical Impression Statement: Pt ambulates into therapy with soft hinged brace donned. Pt tolerates progression of strengthening exercises well. Pt able to complete SLR with minimal lag. Pt is also able to lift heel independently with terminal knee extension. Ice applied at end of session to limit pain and inflammation. PT Plan: Continue to progress strength aggressively per MD.      Problem List Patient Active Problem List   Diagnosis Date Noted  . Knee pain 04/08/2012  . Knee stiffness 04/08/2012  . Difficulty in walking 04/08/2012  . Abnormality of gait 04/08/2012  . GERD (gastroesophageal reflux disease) 08/04/2011  . Abdominal pain 08/04/2011  . Diarrhea 08/04/2011    PT - End of Session Activity Tolerance: Patient tolerated treatment well General Behavior During Therapy: [facility] for tasks assessed/performed PT Plan of Care PT Home Exercise Plan: given; see scanned documents  Seth Bake, PTA   04/26/2013, 2:23 PM

## 2013-04-28 ENCOUNTER — Ambulatory Visit (HOSPITAL_COMMUNITY): Payer: 59 | Admitting: Physical Therapy

## 2013-05-03 ENCOUNTER — Ambulatory Visit (HOSPITAL_COMMUNITY): Payer: 59 | Admitting: *Deleted

## 2013-05-04 ENCOUNTER — Ambulatory Visit (HOSPITAL_COMMUNITY)
Admission: RE | Admit: 2013-05-04 | Discharge: 2013-05-04 | Disposition: A | Discharge status: Home / Self Care | Payer: 59 | Source: Ambulatory Visit | Attending: Pulmonary Disease | Admitting: Pulmonary Disease

## 2013-05-04 ENCOUNTER — Ambulatory Visit (HOSPITAL_COMMUNITY): Payer: 59 | Admitting: *Deleted

## 2013-05-04 NOTE — Progress Notes (Signed)
Physical Therapy Treatment Patient Details  Name: Tammy Chapman MRN: [id number] Date of Birth: 1991-05-19  Today's Date: 05/04/2013 Time: 1015-1100 PT Time Calculation (min): 45 min Visit#: 7 of 16  Re-eval: 05/07/13 Charges:  therex 44  Subjective: Symptoms/Limitations Symptoms: Surgery date:  03/15/13; Pt is now 7 weeks post op and into Phase II of protocol.  Pt without pain.  States she moved this week and was a good LE workout.  Pain Assessment Currently in Pain?: No/denies   Exercise/Treatments Aerobic Elliptical: 5' fwd level 1 Machines for Strengthening Cybex Knee Extension: 3 PL 2X10 Bilateral LE Cybex Knee Flexion: 5 PL 2X10 Bilateral LE's Cybex Leg Press: 4 PL 2X15 Rt only Standing Lateral Step Up: 10 reps;Step Height: 4";Right;Limitations Lateral Step Up Limitations: eccentric control Forward Step Up: 10 reps;Step Height: 4";Hand Hold: 0;Right;Limitations Forward Step Up Limitations: eccentric control Step Down: 10 reps;Step Height: 4";Hand Hold: 0;Right;Limitations Step Down Limitations: eccentric control Wall Squat: 3 sets;Limitations Wall Squat Limitations: 30 second hold wall sits Lunge Walking - Round Trips: 2 RT SLS: knee slightly flexed 3X30" holds no UE's Walking with Sports Cord: All directions 1RT each       Physical Therapy Assessment and Plan PT Assessment and Plan Clinical Impression Statement: Progressed per  patellofemoral ligament repair protocol.  Currently at week 7;   Added progressive lunges, SLS with slight knee flexion, cybex quad/ham and wall sits.   No pain with therex PT Plan: Continue to progress strength aggressively per MD.   Add fitter slide and increase agility activities.   Problem List Patient Active Problem List   Diagnosis Date Noted  . Knee pain 04/08/2012  . Knee stiffness 04/08/2012  . Difficulty in walking 04/08/2012  . Abnormality of gait 04/08/2012  . GERD (gastroesophageal reflux disease) 08/04/2011  . Abdominal  pain 08/04/2011  . Diarrhea 08/04/2011    PT - End of Session Activity Tolerance: Patient tolerated treatment well General Behavior During Therapy: [facility] for tasks assessed/performed PT Plan of Care PT Home Exercise Plan: given; see scanned documents   Lurena Nida, PTA/CLT 05/04/2013, 12:11 PM

## 2013-05-05 ENCOUNTER — Ambulatory Visit (HOSPITAL_COMMUNITY): Payer: 59 | Admitting: Physical Therapy

## 2013-05-11 ENCOUNTER — Ambulatory Visit (HOSPITAL_COMMUNITY)
Admission: RE | Admit: 2013-05-11 | Discharge: 2013-05-11 | Disposition: A | Discharge status: Home / Self Care | Payer: 59 | Source: Ambulatory Visit | Attending: Pulmonary Disease | Admitting: Pulmonary Disease

## 2013-05-11 ENCOUNTER — Ambulatory Visit (HOSPITAL_COMMUNITY): Payer: 59 | Admitting: Physical Therapy

## 2013-05-11 NOTE — Progress Notes (Signed)
Physical Therapy Treatment Patient Details  Name: Tammy Chapman MRN: [id number] Date of Birth: [birth year]  Today's Date: 05/11/2013 Time: 0930-1012 PT Time Calculation (min): 42 min Visit#: 8 of 16  Re-eval: 05/07/13 Authorization: UMR  Charges:  therex 42'     Subjective:  Pt states she's had a rough week at work, having to carry stretchers up/down steps and lifting people.  States no pain today, however.     Exercise/Treatments Aerobic Elliptical: 6' fwd level 2 Machines for Strengthening Cybex Knee Extension: 3 PL 2X15 Bilateral LE Cybex Knee Flexion: 5 PL 2X15 Bilateral LE's Cybex Leg Press: 4 PL 2X15 Rt only Standing Lateral Step Up: 15 reps;Step Height: 4";Right;Limitations Lateral Step Up Limitations: eccentric control Forward Step Up: 15 reps;Step Height: 4";Hand Hold: 0;Right;Limitations Forward Step Up Limitations: eccentric control Step Down: 15 reps;Step Height: 4";Hand Hold: 0;Right;Limitations Step Down Limitations: eccentric control Wall Squat: 3 sets;Limitations Wall Squat Limitations: 30 second hold wall sits Lunge Walking - Round Trips: 2 RT SLS: knee slightly flexed 3X30" holds no UE's Walking with Sports Cord: All directions 1RT each (time)     Physical Therapy Assessment and Plan PT Assessment and Plan Clinical Impression Statement: Pt able to complete all exercises; tactile and verbal cues needed for proper form to decrease substitution of stronger mm groups and for core stability. PT Plan: Continue to progress strength aggressively per MD.   Add biodex isokinetic for strengthening, fitter slide and increased agility activities.  Check knee flexion and if still limited; add PROM/manual as needed.     Problem List Patient Active Problem List   Diagnosis Date Noted  . Knee pain 04/08/2012  . Knee stiffness 04/08/2012  . Difficulty in walking 04/08/2012  . Abnormality of gait 04/08/2012  . GERD (gastroesophageal reflux disease) 08/04/2011  .  Abdominal pain 08/04/2011  . Diarrhea 08/04/2011    PT - End of Session Activity Tolerance: Patient tolerated treatment well General Behavior During Therapy: [facility]FL for tasks assessed/performed   Lurena Nida, PTA/CLT 05/11/2013, 10:29 AM

## 2013-05-18 ENCOUNTER — Ambulatory Visit (HOSPITAL_COMMUNITY)
Admission: RE | Admit: 2013-05-18 | Discharge: 2013-05-18 | Disposition: A | Discharge status: Home / Self Care | Payer: 59 | Source: Ambulatory Visit | Attending: Pulmonary Disease | Admitting: Pulmonary Disease

## 2013-05-18 ENCOUNTER — Ambulatory Visit (HOSPITAL_COMMUNITY): Payer: 59 | Admitting: Physical Therapy

## 2013-05-18 DIAGNOSIS — IMO0001 Reserved for inherently not codable concepts without codable children: Secondary | ICD-10-CM | POA: Insufficient documentation

## 2013-05-18 DIAGNOSIS — R262 Difficulty in walking, not elsewhere classified: Secondary | ICD-10-CM | POA: Insufficient documentation

## 2013-05-18 DIAGNOSIS — M25569 Pain in unspecified knee: Secondary | ICD-10-CM | POA: Insufficient documentation

## 2013-05-18 DIAGNOSIS — M25669 Stiffness of unspecified knee, not elsewhere classified: Secondary | ICD-10-CM | POA: Insufficient documentation

## 2013-05-18 NOTE — Progress Notes (Signed)
Physical Therapy Treatment Patient Details  Name: Tammy Chapman MRN: [id number] Date of Birth: [birth year]  Today's Date: 05/18/2013 Time: 4098-1191 PT Time Calculation (min): 51 min Visit#: 9 of 16  Re-eval: 05/07/13 Authorization: UMR  Charges:  therex 48'  Subjective: Symptoms/Limitations Symptoms: Surgery date:  03/15/13:  Pt is now 8 weeks post op and in phase II of protocol.  Pt is without limitations per MD.  Pt states she has been hurting more with clicking/popping in knee.  Also states knee is wanting to give way on her.  Pt reports she sometimes wishes she never had the surgery because if feels worse now than it did.  Pt reports being noncompliant with HEP.   Exercise/Treatments Aerobic Elliptical: 6' fwd level 2 Isokinetic: Rt quad.  10 reps (180-150-120-120-150-180) Machines for Strengthening Cybex Knee Extension: 3 PL 2X15 Bilateral LE; 1 PL 10 reps Rt LE only Cybex Knee Flexion: 6 PL 2X15 Bilateral LE's; Rt only 3PL 10 reps Cybex Leg Press: 5 PL 2X15 Rt only Standing Lateral Step Up: 15 reps;Step Height: 4";Right;Limitations Lateral Step Up Limitations: eccentric control Forward Step Up: 15 reps;Step Height: 4";Hand Hold: 0;Right;Limitations Forward Step Up Limitations: eccentric control Step Down: 15 reps;Step Height: 4";Hand Hold: 0;Right;Limitations Step Down Limitations: eccentric control Wall Squat: 3 sets;Limitations Wall Squat Limitations: 30 second hold wall sits Lunge Walking - Round Trips: 2 RT SLS: knee slightly flexed 3X30" holds no UE's     Physical Therapy Assessment and Plan PT Assessment and Plan Clinical Impression Statement: Continued to focus on increasing Rt quad strength .  Added 1 set on cybex quad and hamstring machines with Rt LE only.  Added isokinetic quad to further increase Rt quad strength.  Encouraged patient to be compliant with HEP in order for increased benefits.   PT Plan: Continue to progress strength aggressively per MD.    Increase to 2 sets of isokinetic biodex quad work.   Check knee flexion and if still limited; add PROM/manual as needed.     Problem List Patient Active Problem List   Diagnosis Date Noted  . Knee pain 04/08/2012  . Knee stiffness 04/08/2012  . Difficulty in walking 04/08/2012  . Abnormality of gait 04/08/2012  . GERD (gastroesophageal reflux disease) 08/04/2011  . Abdominal pain 08/04/2011  . Diarrhea 08/04/2011    PT - End of Session Activity Tolerance: Patient tolerated treatment well General Behavior During Therapy: WFL for tasks assessed/performed   Lurena Nida, PTA/CLT 05/18/2013, 12:06 PM

## 2013-05-25 ENCOUNTER — Ambulatory Visit (HOSPITAL_COMMUNITY)
Admission: RE | Admit: 2013-05-25 | Discharge: 2013-05-25 | Disposition: A | Discharge status: Home / Self Care | Payer: 59 | Source: Ambulatory Visit | Attending: Pulmonary Disease | Admitting: Pulmonary Disease

## 2013-05-25 ENCOUNTER — Ambulatory Visit (HOSPITAL_COMMUNITY): Payer: 59 | Admitting: Physical Therapy

## 2013-05-25 NOTE — Evaluation (Addendum)
Physical Therapy Re-evaluation  Patient Details  Name: Tammy Chapman MRN: [id number] Date of Birth: 04-Apr-1991  Today's Date: 05/25/2013 Time: 1020-1110 PT Time Calculation (min): 50 min Charges: Therex x 15' MMT/ROMM x 1 Manual x 10' Ice x 10'              Visit#: 10 of 14  Re-eval: 06/22/13  Authorization: UMR     Past Medical History:  Past Medical History  Diagnosis Date  . GERD (gastroesophageal reflux disease)   . Loose body of right knee 02/2013  . Sprain and strain of medial collateral ligament of knee 02/2013    right  . Mosquito bite 03/08/2013   Past Surgical History:  Past Surgical History  Procedure Laterality Date  . Esophagogastroduodenoscopy  05/15/2011    Procedure: ESOPHAGOGASTRODUODENOSCOPY (EGD);  Surgeon: Malissa Hippo, MD;  Location: [facility];  Service: Endoscopy;  Laterality: N/A;  3:15   . Cholecystectomy  01/20/2008  . Tympanostomy tube placement  1999, 2011  . Tonsillectomy and adenoidectomy  1999  . Ear tube removal  2002  . Knee arthroscopy Right 03/15/2013    Procedure: RIGHT  KNEE ARTHROSCOPY, FEMORAL PATELLA REEFING, LATERAL RELEASE, PARTIAL LATERAL MENISCECTOMY ;  Surgeon: Nilda Simmer, MD;  Location: [facility];  Service: Orthopedics;  Laterality: Right;    Subjective Symptoms/Limitations Symptoms: Pt reports slight pain in right knee secondary to busy week at work.  Pain Assessment Currently in Pain?: Yes Pain Score: 2  Pain Location: Knee Pain Orientation: Right  Precautions/Restrictions  Precautions Precautions: Knee   05/25/13 1000  Assessment  Diagnosis patella femoral repain  Surgical Date 03/15/13  Next MD Visit 06/21/2013  Precautions  Precautions Knee  RLE AROM (degrees)  Right Knee Extension 0 (was 8 on 04/07/13)  Right Knee Flexion 132 (was 60 on 04/07/13)  RLE Strength  Right Hip Flexion 4/5 (was 2/5 on 04/07/13)  Right Hip Extension 4/5 (was 4/5 on 04/07/13)  Right Hip ABduction 4/5 (was 4/5  on 04/07/13)  Right Hip ADduction 3+/5 (was 3/5 on 04/07/13)  Right Knee Flexion 4/5 (was 3+/5 on 04/07/13)  Right Knee Extension 3+/5 (was 2-/5 on 04/07/13)    Exercise/Treatments Aerobic Elliptical: 8' fwd level 2 Machines for Strengthening Cybex Knee Extension: 1 PL 10 reps Rt LE only Supine Straight Leg Raises: 10 reps Sidelying Hip ABduction: 10 reps Hip ADduction: 10 reps Prone  Hip Extension: 10 reps  Physical Therapy Assessment and Plan PT Assessment and Plan Clinical Impression Statement: Pt has progressed well with therapy. Pt has made gains in strength and ROM. ROM is WNL. Pt is lacking strength throughout RLE. Encouraged pt to complete HEP three times as day to improve strength. Pt complains of pain and lateral hamstring insertion of RLE. Encouraged pt to begin icing knee twice a day to reduce inflammation. Pt would benefit from continuing skilled PT to improve strength to return to PLOF. PT Plan: Recommend to continue skilled PT 1xper week x 4 more weeks.    Goals Home Exercise Program Pt/caregiver will Perform Home Exercise Program: For increased ROM;For increased strengthening PT Short Term Goals Time to Complete Short Term Goals: 2 weeks PT Short Term Goal 1: Pt to be able to complete terminal extension on own to show improved quad control to that brace may increase in flexion for ease of sitting PT Short Term Goal 1 - Progress: Met PT Long Term Goals Time to Complete Long Term Goals: 4 weeks PT Long Term Goal  1: Pt I in advance HEP  PT Long Term Goal 1 - Progress: Met PT Long Term Goal 2: Pt strength to improve one grade to allow pt to begin weaning from brace PT Long Term Goal 2 - Progress: Met Long Term Goal 3: ROM to 0-120 to allow pt to squat to pick items off the floor Long Term Goal 4: Pt to be going back to the gym for workouts for improved health habits.  Problem List Patient Active Problem List   Diagnosis Date Noted  . Knee pain 04/08/2012  . Knee  stiffness 04/08/2012  . Difficulty in walking 04/08/2012  . Abnormality of gait 04/08/2012  . GERD (gastroesophageal reflux disease) 08/04/2011  . Abdominal pain 08/04/2011  . Diarrhea 08/04/2011    PT - End of Session Activity Tolerance: Patient tolerated treatment well General Behavior During Therapy: [facility] for tasks assessed/performed  Seth Bake, PTA  05/25/2013, 2:25 PM  Physician Documentation Your signature is required to indicate approval of the treatment plan as stated above.  Please sign and either send electronically or make a copy of this report for your files and return this physician signed original.   Please mark one 1.__approve of plan  2. ___approve of plan with the following conditions.   ______________________________                                                          _____________________ Physician Signature                                                                                                             Date

## 2013-06-01 ENCOUNTER — Ambulatory Visit (HOSPITAL_COMMUNITY)
Admission: RE | Admit: 2013-06-01 | Discharge: 2013-06-01 | Disposition: A | Discharge status: Home / Self Care | Payer: 59 | Source: Ambulatory Visit | Attending: Pulmonary Disease | Admitting: Pulmonary Disease

## 2013-06-01 NOTE — Progress Notes (Signed)
Physical Therapy Treatment Patient Details  Name: Tammy Chapman MRN: [id number] Date of Birth: August 30, 1990  Today's Date: 06/01/2013 Time: 3086-5784 PT Time Calculation (min): 52 min Charges: Therex x 35' IFES w/ice x 1  Visit#: 11 of 14  Re-eval: 06/22/13  Authorization: UMR    Subjective: Symptoms/Limitations Symptoms: Pt states that she is having some pain in medial and lateral knee. She describes it as "just enough to be annoying".  Pain Assessment Currently in Pain?: Yes Pain Score: 1  Pain Location: Knee Pain Orientation: Right   Exercise/Treatments Machines for Strengthening Cybex Knee Extension:  (attempted (too painful)) Standing Wall Squat:  (5 reps only secondary to pain) Rocker Board: 2 minutes;Limitations Rocker Board Limitations: A/P R/L SLS with Vectors: 5x5" Seated Long Arc Quad: 5 reps (Pt unable to fully extend secondayr to pain) Supine Straight Leg Raises: 10 reps   Modalities Modalities: Electrical Stimulation;Cryotherapy Cryotherapy Number Minutes Cryotherapy: 10 Minutes Cryotherapy Location: Knee (Right) Type of Cryotherapy: Ice pack Pharmacologist Location: Right knee Electrical Stimulation Action: Decrease pain Electrical Stimulation Parameters: IFES L10 x 10 with IP Electrical Stimulation Goals: Pain  Physical Therapy Assessment and Plan PT Assessment and Plan Clinical Impression Statement: Pt is limited by pain this session. Pain increases to 7-7/10 with strengthening activities. Knee assess by Annett Fabian, PT with no abnormalities found. Completed IFES with ice at end of session to decrease pain. Pt reports pain decrease to 4/10 at end of session. Advised pt to contact MD if pain continues. PT Plan: Continue to progress strength and stability as pain allows.    Problem List Patient Active Problem List   Diagnosis Date Noted  . Knee pain 04/08/2012  . Knee stiffness 04/08/2012  . Difficulty in  walking 04/08/2012  . Abnormality of gait 04/08/2012  . GERD (gastroesophageal reflux disease) 08/04/2011  . Abdominal pain 08/04/2011  . Diarrhea 08/04/2011    PT - End of Session Activity Tolerance: Patient tolerated treatment well General Behavior During Therapy: [facility] for tasks assessed/performed  Seth Bake, PTA 06/01/2013, 4:25 PM

## 2013-06-08 ENCOUNTER — Ambulatory Visit (HOSPITAL_COMMUNITY)
Admission: RE | Admit: 2013-06-08 | Discharge: 2013-06-08 | Disposition: A | Discharge status: Home / Self Care | Payer: 59 | Source: Ambulatory Visit | Attending: Pulmonary Disease | Admitting: Pulmonary Disease

## 2013-06-08 NOTE — Progress Notes (Signed)
Physical Therapy Treatment Patient Details  Name: Tammy Chapman MRN: [id number] Date of Birth: 10-04-1990  Today's Date: 06/08/2013 Time: 1105-1200 PT Time Calculation (min): 55 min Visit#: 12 of 14  Re-eval: 06/22/13 Authorization: UMR  Charges:  therex 1105-1140 (35'), manual 1141-1149 (8'), IFES/icepack 1150-1200 (10')   Subjective: Symptoms/Limitations Symptoms: Pt states she is doing better today without pain in her knee. Pain Assessment Currently in Pain?: No/denies   Exercise/Treatments Aerobic Elliptical: 8' fwd level 2 Isokinetic: Rt quad.  10 reps (180-150-120-120-150-180) Machines for Strengthening Cybex Knee Extension: 1 PL 10 reps Rt LE only Standing Lateral Step Up: 10 reps;Right;Step Height: 4" Lateral Step Up Limitations: eccentric control Forward Step Up: 10 reps;Hand Hold: 0;Step Height: 4";Limitations;Right Forward Step Up Limitations: eccentric control Step Down: 10 reps;Step Height: 4";Hand Hold: 0;Right;Limitations Step Down Limitations: eccentric control Wall Squat: 3 sets;Limitations Wall Squat Limitations: 30 second hold wall sits Lunge Walking - Round Trips: 2 RT Rocker Board: 2 minutes;Limitations Rocker Board Limitations: A/P R/L touching and balancing SLS: knee slightly flexed x 30"    Modalities Modalities: Cryotherapy;Electrical Stimulation Manual Therapy Manual Therapy: Other (comment) Other Manual Therapy: scar massage and MFR to lateral knee Cryotherapy Number Minutes Cryotherapy: 10 Minutes Cryotherapy Location: Knee Type of Cryotherapy: Ice pack Pharmacologist Location: Right knee Electrical Stimulation Action: to decrease pain Electrical Stimulation Parameters: IFES Level 15 with icepack and elevation, hi/lo sweep Electrical Stimulation Goals: Pain  Physical Therapy Assessment and Plan PT Assessment and Plan Clinical Impression Statement: Able to resume most exercises.  ONly had sharp pain  into superior lateral knee with wall slides.  No other discomfort voiced.  Instructed pt with scar massage as well as MFR to decrease adhesions.  continued with IFES and ice at end of session to help decrease pain/discomfort. PT Plan: Continue to progress strength and stability, with eccentric quad focus as pain allows.  Add fitter slide and resume SLS with knee slightly flexed and vectors.     Problem List Patient Active Problem List   Diagnosis Date Noted  . Knee pain 04/08/2012  . Knee stiffness 04/08/2012  . Difficulty in walking 04/08/2012  . Abnormality of gait 04/08/2012  . GERD (gastroesophageal reflux disease) 08/04/2011  . Abdominal pain 08/04/2011  . Diarrhea 08/04/2011    PT - End of Session Activity Tolerance: Patient tolerated treatment well General Behavior During Therapy: WFL for tasks assessed/performed   Lurena Nida, PTA/CLT 06/08/2013, 11:54 AM

## 2013-06-15 ENCOUNTER — Ambulatory Visit (HOSPITAL_COMMUNITY)
Admission: RE | Admit: 2013-06-15 | Discharge: 2013-06-15 | Disposition: A | Discharge status: Home / Self Care | Payer: 59 | Source: Ambulatory Visit | Attending: Pulmonary Disease | Admitting: Pulmonary Disease

## 2013-06-15 NOTE — Progress Notes (Signed)
Physical Therapy Treatment Patient Details  Name: Tammy Chapman MRN: [id number] Date of Birth: March 09, 1991  Today's Date: 06/15/2013 Time: 1102-1200 PT Time Calculation (min): 58 min Charges: Therex x 40' Ice x 10'  Visit#: 13 of 14  Re-eval: 06/22/13  Authorization: UMR   Subjective: Symptoms/Limitations Symptoms: Pt states that she feels like her knee is getting stronger. Pain Assessment Currently in Pain?: No/denies   Exercise/Treatments Aerobic Elliptical: 8' fwd level 2 Isokinetic: Rt knee flex/ext 10 reps (180-150-120-120-150-180) Machines for Strengthening Cybex Knee Extension: 1 PL 10 reps Rt LE only Cybex Knee Flexion: 5 PL RLE only x 10 Standing Lateral Step Up: 10 reps;Right;Step Height: 6" Lateral Step Up Limitations: eccentric control Forward Step Up: 10 reps;Hand Hold: 0;Limitations;Right;Step Height: 6" Forward Step Up Limitations: eccentric control Step Down: 10 reps;Step Height: 4";Hand Hold: 0;Right;Limitations Step Down Limitations: eccentric control Wall Squat: 3 sets;Limitations Wall Squat Limitations: 30 second hold wall sits Lunge Walking - Round Trips: 2 RT SLS: knee slightly flexed x 30"   Modalities Modalities: Cryotherapy;Electrical Stimulation Manual Therapy Manual Therapy: Other (comment) Cryotherapy Number Minutes Cryotherapy: 10 Minutes Cryotherapy Location: Knee Type of Cryotherapy: Ice pack Pharmacologist Goals: Pain  Physical Therapy Assessment and Plan PT Assessment and Plan Clinical Impression Statement: Pt completes therex well after initial cueing and demos. Pt displays decreased difficulty with strengthening exercises. Pt reports no increase in pain throughout session. Ice applied at end of session to limit pain and inflammation. PT Plan: Continue to progress strength and stability, with eccentric quad focus as pain allows.  Add fitter slide and resume SLS with knee slightly flexed and  vectors.     Problem List Patient Active Problem List   Diagnosis Date Noted  . Knee pain 04/08/2012  . Knee stiffness 04/08/2012  . Difficulty in walking 04/08/2012  . Abnormality of gait 04/08/2012  . GERD (gastroesophageal reflux disease) 08/04/2011  . Abdominal pain 08/04/2011  . Diarrhea 08/04/2011    PT - End of Session Activity Tolerance: Patient tolerated treatment well General Behavior During Therapy: [facility] for tasks assessed/performed  Seth Bake, PTA  06/15/2013, 12:10 PM

## 2013-06-22 ENCOUNTER — Ambulatory Visit (HOSPITAL_COMMUNITY)
Admission: RE | Admit: 2013-06-22 | Discharge: 2013-06-22 | Disposition: A | Discharge status: Home / Self Care | Payer: 59 | Source: Ambulatory Visit | Attending: Pulmonary Disease | Admitting: Pulmonary Disease

## 2013-06-22 DIAGNOSIS — M25669 Stiffness of unspecified knee, not elsewhere classified: Secondary | ICD-10-CM | POA: Insufficient documentation

## 2013-06-22 DIAGNOSIS — M25569 Pain in unspecified knee: Secondary | ICD-10-CM | POA: Insufficient documentation

## 2013-06-22 DIAGNOSIS — R262 Difficulty in walking, not elsewhere classified: Secondary | ICD-10-CM | POA: Insufficient documentation

## 2013-06-22 DIAGNOSIS — IMO0001 Reserved for inherently not codable concepts without codable children: Secondary | ICD-10-CM | POA: Insufficient documentation

## 2013-06-22 NOTE — Progress Notes (Signed)
Physical Therapy Re-evaluation  Patient Details  Name: Tammy Chapman MRN: [id number] Date of Birth: [birth year]  Today's Date: 06/22/2013 Time: 1108-1202 PT Time Calculation (min): 54 min              Visit#: 14 of 18  Re-eval: 07/20/13 Assessment Diagnosis: patella femoral repain Surgical Date: 03/15/13 Next MD Visit: 07/19/13 Authorization: UMR    Charges:  therex [id number] (40'), MMT 1150-1155    Subjective Symptoms/Limitations Symptoms: Pt states her pain comes and goes.  Varies 0-6/10.  STates she is compliant with HEP.  REturned to MD last week and he wants her to continue X 1 more month. Pain Assessment Currently in Pain?: Yes Pain Score: 4  Pain Location: Knee Pain Orientation: Right  Objective: Functional Tests Functional Tests: Quad 1RM:  R:18#, L:70# 26%   RLE AROM (degrees) Right Knee Extension: 0 Right Knee Flexion: 132  RLE Strength Right Hip Flexion: 5/5 (was 4/5) Right Hip Extension: 5/5 (was 4/5) Right Hip ABduction: 5/5 (was 4/5) Right Hip ADduction: 5/5 (was 3+/5) Right Knee Flexion: 4/5 (was 4/5) Right Knee Extension: 4/5 (was 3+/5)  Exercise/Treatments Aerobic Elliptical: 8' fwd level 2 Isokinetic: Rt knee flex/ext 10 reps (150-120-90-90-120-150) Machines for Strengthening Cybex Knee Extension: 1 PL 2 sets10 reps Rt LE only Cybex Knee Flexion: 5 PL 2 sets 10 reps RLE only  Standing Lateral Step Up: 15 reps;Right;Step Height: 6" Lateral Step Up Limitations: eccentric control Forward Step Up: 15 reps;Hand Hold: 0;Limitations;Right;Step Height: 6" Forward Step Up Limitations: eccentric control Step Down: 15 reps;Hand Hold: 0;Right;Limitations;Step Height: 6" Step Down Limitations: eccentric control Wall Squat: 3 sets;Limitations Wall Squat Limitations: 30 second hold wall sits Lunge Walking - Round Trips: 2 RT Supine Straight Leg Raises: Limitations Straight Leg Raises Limitations: add next with attn to [facility]     Physical Therapy  Assessment and Plan PT Assessment and Plan Clinical Impression Statement: Pt has completed 14/14 PT treatments X 8 weeks to increase Lt knee ROM and strength.  Pt has met all STG's and 2/4 LTG's.  Rt hip strength is now WNL, however Rt quad and hamstring continue to be limited, mostly quad.  1RM performed today with Rt quad being 26% of Lt in strength.  Pt will benefit from continued therapy to focus on improving Rt quad/hamstring strength as well as independence in advanced HEP and compliance with gym.  Pain continues to come and go and vary from 0-6/10. PT Plan: Recommend to Continue to progress strength and stability of Rt LE with eccentric quad focus as pain allows.     Goals Home Exercise Program Pt/caregiver will Perform Home Exercise Program: For increased ROM;For increased strengthening PT Goal: Perform Home Exercise Program - Progress: Met PT Short Term Goals Time to Complete Short Term Goals: 2 weeks PT Short Term Goal 1: Pt to be able to complete terminal extension on own to show improved quad control to that brace may increase in flexion for ease of sitting PT Short Term Goal 1 - Progress: Met PT Long Term Goals Time to Complete Long Term Goals: 4 weeks PT Long Term Goal 1: Pt I in advance HEP  PT Long Term Goal 1 - Progress: Progressing toward goal PT Long Term Goal 2: Pt strength to improve one grade to allow pt to begin weaning from brace PT Long Term Goal 2 - Progress: Met Long Term Goal 3: ROM to 0-120 to allow pt to squat to pick items off the floor Long Term Goal 3 Progress:  Met Long Term Goal 4: Pt to be going back to the gym for workouts for improved health habits. Long Term Goal 4 Progress: Progressing toward goal  Problem List Patient Active Problem List   Diagnosis Date Noted  . Knee pain 04/08/2012  . Knee stiffness 04/08/2012  . Difficulty in walking 04/08/2012  . Abnormality of gait 04/08/2012  . GERD (gastroesophageal reflux disease) 08/04/2011  . Abdominal  pain 08/04/2011  . Diarrhea 08/04/2011    PT - End of Session Activity Tolerance: Patient tolerated treatment well General Behavior During Therapy: WFL for tasks assessed/performed   Lurena Nida, PTA/CLT 06/22/2013, 12:23 PM

## 2013-06-29 ENCOUNTER — Ambulatory Visit (HOSPITAL_COMMUNITY): Payer: 59 | Admitting: Physical Therapy

## 2013-07-06 ENCOUNTER — Ambulatory Visit (HOSPITAL_COMMUNITY)
Admission: RE | Admit: 2013-07-06 | Discharge: 2013-07-06 | Disposition: A | Discharge status: Home / Self Care | Payer: 59 | Source: Ambulatory Visit | Attending: Pulmonary Disease | Admitting: Pulmonary Disease

## 2013-07-06 NOTE — Progress Notes (Signed)
Physical Therapy Treatment Patient Details  Name: Tammy Chapman MRN: [id number] Date of Birth: Dec 27, 1990  Today's Date: 07/06/2013 Time: 1308-6578 PT Time Calculation (min): 35 min Charges: Therex x 30'  Visit#: 15 of 18  Re-eval: 07/20/13  Authorization: UMR   Subjective: Symptoms/Limitations Symptoms: Pt states that she's having some stiffness today from the cold weather but is pain free. Pain Assessment Currently in Pain?: No/denies   Exercise/Treatments Aerobic Elliptical: 8' fwd level 3 to increase strength and activity tolerance Machines for Strengthening Cybex Knee Extension: 1 PL 2 sets10 reps Rt LE only Cybex Knee Flexion: 5 PL 2 sets 10 reps RLE only  Standing Lateral Step Up: 15 reps;Right;Step Height: 4" Lateral Step Up Limitations: eccentric control Forward Step Up: 15 reps;Hand Hold: 0;Limitations;Right;Step Height: 6" Forward Step Up Limitations: eccentric control Step Down: 15 reps;Hand Hold: 0;Right;Limitations;Step Height: 4" Step Down Limitations: eccentric control Supine Straight Leg Raises: 10 reps Straight Leg Raises Limitations: RLE with ER  Physical Therapy Assessment and Plan PT Assessment and Plan Clinical Impression Statement: Pt appears to be progressing well. Increased concentric quad strength noted. Pt continues to lack eccentric quad strength. Completed lateral step ups and forward step downs on 4" rather than 6" step secondary to decreased eccentric control. Pt plans to ice knee at home. PT Plan: Continue to progress strength and stability of Rt LE with eccentric quad focus as pain allows.      Problem List Patient Active Problem List   Diagnosis Date Noted  . Knee pain 04/08/2012  . Knee stiffness 04/08/2012  . Difficulty in walking 04/08/2012  . Abnormality of gait 04/08/2012  . GERD (gastroesophageal reflux disease) 08/04/2011  . Abdominal pain 08/04/2011  . Diarrhea 08/04/2011    PT - End of Session Activity Tolerance:  Patient tolerated treatment well General Behavior During Therapy: [facility] for tasks assessed/performed  Seth Bake, PTA  07/06/2013, 1:13 PM

## 2013-07-13 ENCOUNTER — Ambulatory Visit (HOSPITAL_COMMUNITY)
Admission: RE | Admit: 2013-07-13 | Discharge: 2013-07-13 | Disposition: A | Discharge status: Home / Self Care | Payer: 59 | Source: Ambulatory Visit | Attending: Pulmonary Disease | Admitting: Pulmonary Disease

## 2013-07-13 NOTE — Evaluation (Signed)
Physical Therapy Re-evaluation  Patient Details  Name: Tammy Chapman MRN: [id number] Date of Birth: 31-Jul-1991  Today's Date: 07/13/2013 Time: 9629-5284 PT Time Calculation (min): 30 min Charges: Therex x 15' Self care x 12'              Visit#: 16 of 18  Re-eval: 07/20/13 Assessment Diagnosis: Patella femoral repain Surgical Date: 03/15/13 Next MD Visit: 07/19/13 Thurston Hole  Authorization: UMR     Past Medical History:  Past Medical History  Diagnosis Date  . GERD (gastroesophageal reflux disease)   . Loose body of right knee 02/2013  . Sprain and strain of medial collateral ligament of knee 02/2013    right  . Mosquito bite 03/08/2013   Past Surgical History:  Past Surgical History  Procedure Laterality Date  . Esophagogastroduodenoscopy  05/15/2011    Procedure: ESOPHAGOGASTRODUODENOSCOPY (EGD);  Surgeon: Malissa Hippo, MD;  [facility];  Service: Endoscopy;  Laterality: N/A;  3:15   . Cholecystectomy  01/20/2008  . Tympanostomy tube placement  1999, 2011  . Tonsillectomy and adenoidectomy  1999  . Ear tube removal  2002  . Knee arthroscopy Right 03/15/2013    Procedure: RIGHT  KNEE ARTHROSCOPY, FEMORAL PATELLA REEFING, LATERAL RELEASE, PARTIAL LATERAL MENISCECTOMY ;  Surgeon: Nilda Simmer, MD;  Location: [facility];  Service: Orthopedics;  Laterality: Right;    Subjective Symptoms/Limitations Symptoms: pt states that she her knee is very stiff and sore from cold weather. Pain Assessment Currently in Pain?: Yes Pain Score: 4  Pain Location: Knee Pain Orientation: Right  Sensation/Coordination/Flexibility/Functional Tests Functional Tests Functional Tests: Quad 1RM:  R:24#, L:82# 29% (was R:18#, L:70# 26% on 06/22/13)  Assessment RLE AROM (degrees) Right Knee Extension: 0 (was 0 on 06/22/13) Right Knee Flexion: 132 (was 132 06/22/14) RLE Strength Right Hip Flexion: 5/5 (was 5/5 on 06/22/13) Right Hip Extension: 5/5 (was 5/5 on  06/22/13) Right Hip ABduction: 5/5 (was 5/5 on 06/22/13) Right Hip ADduction: 5/5 (was 5/5 on 06/22/13) Right Knee Flexion: 5/5 (was 4/5 on 06/22/13) Right Knee Extension: 4/5 (4+/5 was on 4/5 06/22/13)  Exercise/Treatments Aerobic Elliptical: 10' fwd level 3 to increase strength and activity tolerance; vc's to avoid locking knees Machines for Strengthening Cybex Knee Extension: 1 PL 2 sets10 reps Rt LE only  Physical Therapy Assessment and Plan PT Assessment and Plan Clinical Impression Statement: Pt displays improvements in lower extremity strength. Pt is lacking eccentric quad control and quad power. Pt would like to improve ability to negotiate stairs and complete work activities with increased ease. Pt would benefit from continuing skilled PT to continues to progress functional strength. PT Plan: Recommend to continue skilled PT to improve functional strength.    Goals Home Exercise Program Pt/caregiver will Perform Home Exercise Program: For increased ROM;For increased strengthening PT Short Term Goals Time to Complete Short Term Goals: 2 weeks PT Short Term Goal 1: Pt to be able to complete terminal extension on own to show improved quad control to that brace may increase in flexion for ease of sitting PT Short Term Goal 1 - Progress: Met PT Long Term Goals Time to Complete Long Term Goals: 4 weeks PT Long Term Goal 1: Pt I in advance HEP  PT Long Term Goal 1 - Progress: Progressing toward goal PT Long Term Goal 2: Pt to be going back to the gym for workouts for improved health habits. PT Long Term Goal 2 - Progress: Progressing toward goal Long Term Goal 3: Pt  to be able to asecend/descend stairs reciprocally without difficulty or increased pain. Long Term Goal 3 Progress:  (Goal set today) Long Term Goal 4: Pt to improve 1 rep max of right quad to 90% of  left quad for pt to complete work activities with minimal difficulty/pain. (Goal set today)  Problem List Patient Active  Problem List   Diagnosis Date Noted  . Knee pain 04/08/2012  . Knee stiffness 04/08/2012  . Difficulty in walking 04/08/2012  . Abnormality of gait 04/08/2012  . GERD (gastroesophageal reflux disease) 08/04/2011  . Abdominal pain 08/04/2011  . Diarrhea 08/04/2011    PT - End of Session Activity Tolerance: Patient tolerated treatment well General Behavior During Therapy: [facility] for tasks assessed/performed  Seth Bake, PTA  07/13/2013, 2:31 PM  Physician Documentation Your signature is required to indicate approval of the treatment plan as stated above.  Please sign and either send electronically or make a copy of this report for your files and return this physician signed original.   Please mark one 1.__approve of plan  2. ___approve of plan with the following conditions.   ______________________________                                                          _____________________ Physician Signature                                                                                                             Date

## 2013-07-18 NOTE — Addendum Note (Signed)
Encounter addended by: Bella Kennedy, PT on: 07/18/2013  4:24 PM<BR>     Documentation filed: Letters

## 2013-07-19 ENCOUNTER — Ambulatory Visit (HOSPITAL_COMMUNITY)
Admission: RE | Admit: 2013-07-19 | Discharge: 2013-07-19 | Disposition: A | Discharge status: Home / Self Care | Payer: 59 | Source: Ambulatory Visit | Attending: Pulmonary Disease | Admitting: Pulmonary Disease

## 2013-07-19 DIAGNOSIS — R262 Difficulty in walking, not elsewhere classified: Secondary | ICD-10-CM | POA: Insufficient documentation

## 2013-07-19 DIAGNOSIS — M25669 Stiffness of unspecified knee, not elsewhere classified: Secondary | ICD-10-CM | POA: Insufficient documentation

## 2013-07-19 DIAGNOSIS — IMO0001 Reserved for inherently not codable concepts without codable children: Secondary | ICD-10-CM | POA: Insufficient documentation

## 2013-07-19 DIAGNOSIS — M25569 Pain in unspecified knee: Secondary | ICD-10-CM | POA: Insufficient documentation

## 2013-07-19 NOTE — Progress Notes (Signed)
Physical Therapy Treatment Patient Details  Name: Tammy Chapman MRN: [id number] Date of Birth: 1991-06-17  Today's Date: 07/19/2013 Time: 4098-1191 PT Time Calculation (min): 49 min Charges: Therex x 45'  Visit#: 17 of 20  Re-eval: 08/10/13   Authorization: UMR   Subjective: Symptoms/Limitations Symptoms: Pt states that she is not having pain, only tightness/stiffness. Pain Assessment Currently in Pain?: No/denies  Exercise/Treatments Aerobic Elliptical: 10' fwd level 3 to increase strength and activity tolerance; vc's to avoid locking knees Machines for Strengthening Cybex Knee Extension: 1 PL 2 sets10 reps Rt LE only Cybex Knee Flexion: 5.5 PL 2x10 RLE only Standing Lateral Step Up: 15 reps;Right;Step Height: 4" Lateral Step Up Limitations: eccentric control Wall Squat: 3 sets;Limitations Wall Squat Limitations: 30 second hold wall sits Lunge Walking - Round Trips: 2 RT Stairs: 1 RT Rocker Board: 2 minutes Rocker Board Limitations: R/L no UE assist  Physical Therapy Assessment and Plan PT Assessment and Plan Clinical Impression Statement: HEP made for pt to use at gym per MD request. Pt is able to ascend/descend stairs with 1 HR using reciprocal pattern. Pt continues to lack eccentric quad control. Encouraged pt to practice stair climbing at home.  PT Plan: Recommend to continue skilled PT to improve functional strength.    Problem List Patient Active Problem List   Diagnosis Date Noted  . Knee pain 04/08/2012  . Knee stiffness 04/08/2012  . Difficulty in walking 04/08/2012  . Abnormality of gait 04/08/2012  . GERD (gastroesophageal reflux disease) 08/04/2011  . Abdominal pain 08/04/2011  . Diarrhea 08/04/2011    PT - End of Session Activity Tolerance: Patient tolerated treatment well General Behavior During Therapy: [facility] for tasks assessed/performed  Seth Bake, PTA 07/19/2013, 5:39 PM

## 2013-07-20 ENCOUNTER — Ambulatory Visit (HOSPITAL_COMMUNITY): Payer: 59 | Admitting: *Deleted

## 2013-07-27 ENCOUNTER — Ambulatory Visit (HOSPITAL_COMMUNITY)
Admission: RE | Admit: 2013-07-27 | Discharge: 2013-07-27 | Disposition: A | Discharge status: Home / Self Care | Payer: 59 | Source: Ambulatory Visit | Attending: *Deleted | Admitting: *Deleted

## 2013-07-27 NOTE — Progress Notes (Signed)
Physical Therapy Treatment Patient Details  Name: Tammy Chapman MRN: [id number] Date of Birth: [birth year]  Today's Date: 07/27/2013 Time: 1105-1150 PT Time Calculation (min): 45 min Visit#: 18 of 20  Re-eval: 08/10/13 Authorization: UMR  Charges:  therex 44'  Subjective: Symptoms/Limitations Symptoms: Pt states she has been doing more activity and went to the gym last week.  currently without pain. Pain Assessment Currently in Pain?: No/denies   Exercise/Treatments Aerobic Elliptical: 10' fwd level 3 to increase strength and activity tolerance; vc's to avoid locking knees Isokinetic: Rt knee flex/ext 10 reps (150-120-90-90-120-150) Machines for Strengthening Cybex Knee Extension: 1.5 PL 2 sets10 reps Rt LE only Cybex Knee Flexion: 5.5 PL 2x10 RLE only Standing Lateral Step Up: 15 reps;Right;Step Height: 4" Lateral Step Up Limitations: eccentric control Wall Squat: 3 sets;Limitations Wall Squat Limitations: 30 second hold wall sits Lunge Walking - Round Trips: 2 RT Stairs: 2 flights 1RT Rocker Board: 2 minutes Rocker Board Limitations: R/L no UE assist     Physical Therapy Assessment and Plan PT Assessment and Plan Clinical Impression Statement: Pt reports compliance with beginning use of gym independently last week.  Descending stairs remain most difficult as eccentric strength of quad remains weak.  Resumed isokinetic strengthening of quad of biodex. PT Plan: Recommend to continue skilled PT to improve functional strength.  RE-evaluate X 2 more weeks.     Problem List Patient Active Problem List   Diagnosis Date Noted  . Knee pain 04/08/2012  . Knee stiffness 04/08/2012  . Difficulty in walking 04/08/2012  . Abnormality of gait 04/08/2012  . GERD (gastroesophageal reflux disease) 08/04/2011  . Abdominal pain 08/04/2011  . Diarrhea 08/04/2011    PT - End of Session Activity Tolerance: Patient tolerated treatment well General Behavior During Therapy: WFL for  tasks assessed/performed   Lurena Nida, PTA/CLT 07/27/2013, 12:17 PM

## 2013-08-03 ENCOUNTER — Telehealth (HOSPITAL_COMMUNITY): Payer: Self-pay

## 2013-08-03 ENCOUNTER — Ambulatory Visit (HOSPITAL_COMMUNITY): Payer: 59 | Admitting: Physical Therapy

## 2013-08-10 ENCOUNTER — Telehealth (HOSPITAL_COMMUNITY): Payer: Self-pay

## 2013-08-10 ENCOUNTER — Ambulatory Visit (HOSPITAL_COMMUNITY): Payer: 59 | Admitting: Physical Therapy

## 2013-08-17 ENCOUNTER — Ambulatory Visit (HOSPITAL_COMMUNITY): Payer: 59 | Admitting: *Deleted

## 2014-06-29 ENCOUNTER — Encounter: Payer: 59 | Admitting: Adult Health

## 2015-03-08 ENCOUNTER — Other Ambulatory Visit (HOSPITAL_COMMUNITY): Payer: Self-pay | Admitting: Pulmonary Disease

## 2015-03-08 DIAGNOSIS — H9203 Otalgia, bilateral: Secondary | ICD-10-CM

## 2015-03-09 ENCOUNTER — Ambulatory Visit (HOSPITAL_COMMUNITY)
Admission: RE | Admit: 2015-03-09 | Discharge: 2015-03-09 | Disposition: A | Discharge status: Home / Self Care | Payer: 59 | Source: Ambulatory Visit | Attending: Pulmonary Disease | Admitting: Pulmonary Disease

## 2015-03-09 DIAGNOSIS — H9203 Otalgia, bilateral: Secondary | ICD-10-CM | POA: Diagnosis present

## 2015-09-11 DIAGNOSIS — H7313 Chronic myringitis, bilateral: Secondary | ICD-10-CM | POA: Diagnosis not present

## 2015-09-11 DIAGNOSIS — H6063 Unspecified chronic otitis externa, bilateral: Secondary | ICD-10-CM | POA: Diagnosis not present

## 2015-09-11 DIAGNOSIS — H6983 Other specified disorders of Eustachian tube, bilateral: Secondary | ICD-10-CM | POA: Diagnosis not present

## 2015-10-23 DIAGNOSIS — M9902 Segmental and somatic dysfunction of thoracic region: Secondary | ICD-10-CM | POA: Diagnosis not present

## 2015-10-23 DIAGNOSIS — M9903 Segmental and somatic dysfunction of lumbar region: Secondary | ICD-10-CM | POA: Diagnosis not present

## 2015-10-23 DIAGNOSIS — M545 Low back pain: Secondary | ICD-10-CM | POA: Diagnosis not present

## 2015-10-23 DIAGNOSIS — M9905 Segmental and somatic dysfunction of pelvic region: Secondary | ICD-10-CM | POA: Diagnosis not present

## 2015-10-25 DIAGNOSIS — M9905 Segmental and somatic dysfunction of pelvic region: Secondary | ICD-10-CM | POA: Diagnosis not present

## 2015-10-25 DIAGNOSIS — M9903 Segmental and somatic dysfunction of lumbar region: Secondary | ICD-10-CM | POA: Diagnosis not present

## 2015-10-25 DIAGNOSIS — M9902 Segmental and somatic dysfunction of thoracic region: Secondary | ICD-10-CM | POA: Diagnosis not present

## 2015-10-25 DIAGNOSIS — M545 Low back pain: Secondary | ICD-10-CM | POA: Diagnosis not present

## 2015-10-29 DIAGNOSIS — M9905 Segmental and somatic dysfunction of pelvic region: Secondary | ICD-10-CM | POA: Diagnosis not present

## 2015-10-29 DIAGNOSIS — M9903 Segmental and somatic dysfunction of lumbar region: Secondary | ICD-10-CM | POA: Diagnosis not present

## 2015-10-29 DIAGNOSIS — M545 Low back pain: Secondary | ICD-10-CM | POA: Diagnosis not present

## 2015-10-29 DIAGNOSIS — M9902 Segmental and somatic dysfunction of thoracic region: Secondary | ICD-10-CM | POA: Diagnosis not present

## 2015-10-31 DIAGNOSIS — M545 Low back pain: Secondary | ICD-10-CM | POA: Diagnosis not present

## 2015-10-31 DIAGNOSIS — M9903 Segmental and somatic dysfunction of lumbar region: Secondary | ICD-10-CM | POA: Diagnosis not present

## 2015-10-31 DIAGNOSIS — M9905 Segmental and somatic dysfunction of pelvic region: Secondary | ICD-10-CM | POA: Diagnosis not present

## 2015-10-31 DIAGNOSIS — M9902 Segmental and somatic dysfunction of thoracic region: Secondary | ICD-10-CM | POA: Diagnosis not present

## 2015-11-02 DIAGNOSIS — M9903 Segmental and somatic dysfunction of lumbar region: Secondary | ICD-10-CM | POA: Diagnosis not present

## 2015-11-02 DIAGNOSIS — M545 Low back pain: Secondary | ICD-10-CM | POA: Diagnosis not present

## 2015-11-02 DIAGNOSIS — M9902 Segmental and somatic dysfunction of thoracic region: Secondary | ICD-10-CM | POA: Diagnosis not present

## 2015-11-02 DIAGNOSIS — M9905 Segmental and somatic dysfunction of pelvic region: Secondary | ICD-10-CM | POA: Diagnosis not present

## 2015-12-22 ENCOUNTER — Encounter: Payer: Self-pay | Admitting: Emergency Medicine

## 2015-12-22 ENCOUNTER — Emergency Department
Admission: EM | Admit: 2015-12-22 | Discharge: 2015-12-22 | Disposition: A | Discharge status: Home / Self Care | Payer: 59 | Source: Home / Self Care | Attending: Emergency Medicine | Admitting: Emergency Medicine

## 2015-12-22 DIAGNOSIS — R1031 Right lower quadrant pain: Secondary | ICD-10-CM

## 2015-12-22 DIAGNOSIS — Z202 Contact with and (suspected) exposure to infections with a predominantly sexual mode of transmission: Secondary | ICD-10-CM | POA: Diagnosis not present

## 2015-12-22 DIAGNOSIS — Z8742 Personal history of other diseases of the female genital tract: Secondary | ICD-10-CM

## 2015-12-22 LAB — POCT URINALYSIS DIP (MANUAL ENTRY)
Bilirubin, UA: NEGATIVE
Glucose, UA: NEGATIVE
Ketones, POC UA: NEGATIVE
Leukocytes, UA: NEGATIVE
Nitrite, UA: NEGATIVE
Protein Ur, POC: NEGATIVE
Spec Grav, UA: 1.015 (ref 1.005–1.03)
Urobilinogen, UA: 0.2 (ref 0–1)
pH, UA: 7.5 (ref 5–8)

## 2015-12-22 LAB — POCT URINE PREGNANCY: Preg Test, Ur: NEGATIVE

## 2015-12-22 NOTE — ED Notes (Signed)
Patient presents to [facility] with C/O abd pain and pain in the right lower back  upon waking this morning rates pain 4-5/10 sharp in nature. C/O slight nausea at times denies vomiting. Pain also in right lower back

## 2015-12-22 NOTE — ED Provider Notes (Signed)
CSN: [id number]     Arrival date & time 12/22/15  1234 History   First MD Initiated Contact with Patient 12/22/15 1243     Chief Complaint  Patient presents with  . Abdominal Pain    HPI 25 year old female. LMP 12/14/15. She was well yesterday but awoke this morning with moderate intensity, sharp right lower quadrant pain 4 out of 10 intensity, associated with right active pain. Had slight nausea but no vomiting. No change in bowel habits. No diarrhea. The abdominal pain and all other above symptoms resolved after 2 hours and right now she is asymptomatic without any pain. Currently denies any pelvic pain. No vaginal discharge or bleeding. Denies dysuria or hematuria. No neurologic symptoms. No numbness or weakness in extremities. She recalls that in the past, she occasionally has had menstrual pain and mid cycle ovulatory pain. Past Medical History  Diagnosis Date  . GERD (gastroesophageal reflux disease)   . Loose body of right knee 02/2013  . Sprain and strain of medial collateral ligament of knee 02/2013    right  . Mosquito bite 03/08/2013   Past Surgical History  Procedure Laterality Date  . Esophagogastroduodenoscopy  05/15/2011    Procedure: ESOPHAGOGASTRODUODENOSCOPY (EGD);  Surgeon: Rogene Houston, MD;  [facility];  Service: Endoscopy;  Laterality: N/A;  3:15   . Cholecystectomy  01/20/2008  . Tympanostomy tube placement  1999, 2011  . Tonsillectomy and adenoidectomy  1999  . Ear tube removal  2002  . Knee arthroscopy Right 03/15/2013    Procedure: RIGHT  KNEE ARTHROSCOPY, FEMORAL PATELLA REEFING, LATERAL RELEASE, PARTIAL LATERAL MENISCECTOMY ;  Surgeon: Lorn Junes, MD;  Location: [place];  Service: Orthopedics;  Laterality: Right;   History reviewed. No pertinent family history. Social History  Substance Use Topics  . Smoking status: Never Smoker   . Smokeless tobacco: Never Used  . Alcohol Use: No   OB History    No data available      Review of Systems  All other systems reviewed and are negative.   Allergies  Benadryl; Penicillins; Adhesive; Lactose intolerance (gi); Latex; Sulfa antibiotics; and Ultram  Home Medications   Prior to Admission medications   Medication Sig Start Date End Date Taking? Authorizing Provider  dexlansoprazole (DEXILANT) 60 MG capsule Take 60 mg by mouth daily.     Historical Provider, MD  naproxen sodium (ANAPROX) 220 MG tablet Take 220 mg by mouth 2 (two) times daily with a meal.    Historical Provider, MD   Meds Ordered and Administered this Visit  Medications - No data to display  BP 134/86 mmHg  Temp(Src) 99 F (37.2 C) (Oral)  Resp 16  Ht 5\' 10"  (1.778 m)  Wt 216 lb 12 oz (98.317 kg)  BMI 31.10 kg/m2  SpO2 98% No data found.   Physical Exam  Constitutional: She is oriented to person, place, and time. She appears well-developed and well-nourished. No distress.  HENT:  Head: Normocephalic and atraumatic.  Mouth/Throat: Oropharynx is clear and moist.  Eyes: Pupils are equal, round, and reactive to light. No scleral icterus.  Neck: Normal range of motion. Neck supple.  Cardiovascular: Normal rate and regular rhythm.   Pulmonary/Chest: Effort normal.  Abdominal: Soft. Bowel sounds are normal. She exhibits no distension and no mass. There is no hepatosplenomegaly. There is no tenderness. There is no rebound, no guarding and no CVA tenderness. Hernia confirmed negative in the ventral area.  Musculoskeletal:  Lumbar back: Normal. She exhibits no tenderness, no bony tenderness and no spasm.  She points to the right paralumbar area where she had some pain this morning, but no longer has this pain. Nontender.  Neurological: She is alert and oriented to person, place, and time.  Skin: Skin is warm and dry.  Psychiatric: She has a normal mood and affect. Her behavior is normal.    ED Course  Procedures (including critical care time)  Labs Review Labs Reviewed  POCT  URINALYSIS DIP (MANUAL ENTRY) - Abnormal; Notable for the following:    Blood, UA trace-intact (*)    All other components within normal limits  URINE CULTURE  POCT URINE PREGNANCY  Urine pregnancy test negative   MDM   1. Right lower quadrant abdominal pain   2. History of ovulatory pain     Back and abdominal exam negative and she has 0 pain now. Urinalysis negative except trace blood,Most likely not significant. We'll send urine culture. Advised ibuprofen OTC when necessary pain. She likely had menstrual or ovulatory pain this morning, since resolved. Based on her history and presentation, I explained it's much less likely that this could be kidney stone.--However, red flags discussed and advised to follow-up in urgent care or emergency department if any severe or new symptoms. Clinically, no sign of acute abdomen. We both feel that further testing not indicated at this time and she is now asymptomatic. Advised to follow-up with PCP within one week, and have urinalysis rechecked. Follow-up sooner if symptoms become worse. Precautions discussed. Red flags discussed. Questions invited and answered. Patient voiced understanding and agreement.   Jacqulyn Cane, MD 12/22/15 [phone]

## 2015-12-23 LAB — URINE CULTURE: Colony Count: 75000

## 2015-12-24 ENCOUNTER — Telehealth: Payer: Self-pay | Admitting: *Deleted

## 2016-01-10 DIAGNOSIS — J309 Allergic rhinitis, unspecified: Secondary | ICD-10-CM | POA: Diagnosis not present

## 2016-01-10 DIAGNOSIS — J209 Acute bronchitis, unspecified: Secondary | ICD-10-CM | POA: Diagnosis not present

## 2016-01-31 ENCOUNTER — Encounter: Payer: Self-pay | Admitting: Obstetrics and Gynecology

## 2016-01-31 ENCOUNTER — Ambulatory Visit (INDEPENDENT_AMBULATORY_CARE_PROVIDER_SITE_OTHER): Payer: 59 | Admitting: Obstetrics and Gynecology

## 2016-01-31 VITALS — BP 120/82 | Ht 70.0 in | Wt 215.0 lb

## 2016-01-31 DIAGNOSIS — N898 Other specified noninflammatory disorders of vagina: Secondary | ICD-10-CM

## 2016-01-31 DIAGNOSIS — N970 Female infertility associated with anovulation: Secondary | ICD-10-CM

## 2016-01-31 NOTE — Progress Notes (Signed)
Patient ID: Tammy Chapman, female   DOB: [birth year], 25 y.o.   MRN: [id number] Pt here today for irregular periods. Pt states that her last period was 12/14/15.

## 2016-01-31 NOTE — Progress Notes (Signed)
[facility] Visit  @DATE @            Patient name: Tammy Chapman MRN [id number]  Date of birth: [birth year]  CC & HPI:  Tammy Chapman is a 25 y.o. female presenting today for irregular menses. She states her LMP occurred 12/14/2015. She last had a period before that during 09/2015. Patient states her periods typically last 4-9 days, with the largest amount of bleeding on day 1, and accompanied by cramping. Patient states she experienced menarche at age 19. She reports her menses were regular throughout her teenage years but recently became irregular, most noticeably when she gained weight over the past 1-2 years, after undergoing knee surgery. Patient states she is sexually active with her fiance, without contraception (hoping for children), for the past 2 years.  Patient states she has not had STD testing, as she has used condoms with every sexual partner beside her fiance. Patient states her fiance has 2 sons and has had normal fertility tests.   ROS:  Review of Systems  Genitourinary:       Positive for irregular periods.  All other systems reviewed and are negative.   Pertinent History Reviewed:   Reviewed: Significant for n/a Medical         Past Medical History  Diagnosis Date  . GERD (gastroesophageal reflux disease)   . Loose body of right knee 02/2013  . Sprain and strain of medial collateral ligament of knee 02/2013    right  . Mosquito bite 03/08/2013                              Surgical Hx:    Past Surgical History  Procedure Laterality Date  . Esophagogastroduodenoscopy  05/15/2011    Procedure: ESOPHAGOGASTRODUODENOSCOPY (EGD);  Surgeon: Rogene Houston, MD;  [facility];  Service: Endoscopy;  Laterality: N/A;  3:15   . Cholecystectomy  01/20/2008  . Tympanostomy tube placement  1999, 2011  . Tonsillectomy and adenoidectomy  1999  . Ear tube removal  2002  . Knee arthroscopy Right 03/15/2013    Procedure: RIGHT  KNEE ARTHROSCOPY, FEMORAL PATELLA  REEFING, LATERAL RELEASE, PARTIAL LATERAL MENISCECTOMY ;  Surgeon: Lorn Junes, MD;  Location: [place];  Service: Orthopedics;  Laterality: Right;   Medications: Reviewed & Updated - see associated section                       Current outpatient prescriptions:  .  cetirizine (ZYRTEC) 10 MG tablet, Take 10 mg by mouth daily., Disp: , Rfl:  .  fluticasone (FLONASE) 50 MCG/ACT nasal spray, Place into both nostrils daily., Disp: , Rfl:    Social History: Reviewed -  reports that she has never smoked. She has never used smokeless tobacco.  Objective Findings:  Vitals: Blood pressure 120/82, height 5\' 10"  (1.778 m), weight 215 lb (97.523 kg), last menstrual period 12/14/2015.  Physical Examination: General appearance - alert, well appearing, and in no distress, oriented to person, place, and time and overweight Mental status - alert, oriented to person, place, and time, normal mood, behavior, speech, dress, motor activity, and thought processes, affect appropriate to mood Pelvic -  VULVA: normal appearing vulva with no masses, tenderness or lesions,  VAGINA: normal appearing vagina with normal color and discharge, no lesions,  CERVIX: normal appearing cervix without discharge or lesions; normal cervical mucus  Assessment & Plan:   A:  1. Suspect chronic anovulation.  2. TSH ordered. Also serum progesterone  P:  1. Rx Clomid.after review of labs 2. Results by MyChart.    By signing my name below, I, Stephania Fragmin, attest that this documentation has been prepared under the direction and in the presence of Jonnie Kind, MD. Electronically Signed: Stephania Fragmin, ED Scribe. 01/31/2016. 3:55 PM.  I personally performed the services described in this documentation, which was SCRIBED in my presence. The recorded information has been reviewed and considered accurate. It has been edited as necessary during review. Jonnie Kind, MD

## 2016-01-31 NOTE — Patient Instructions (Signed)
MyChart. To check results of TSH and progesterone. Begin Clomid upon result of labs.

## 2016-02-01 DIAGNOSIS — N970 Female infertility associated with anovulation: Secondary | ICD-10-CM | POA: Insufficient documentation

## 2016-02-01 LAB — TSH: TSH: 1.18 u[IU]/mL (ref 0.450–4.500)

## 2016-02-01 LAB — PROGESTERONE: Progesterone: 0.4 ng/mL

## 2016-02-02 LAB — GC/CHLAMYDIA PROBE AMP
Chlamydia trachomatis, NAA: NEGATIVE
Neisseria gonorrhoeae by PCR: NEGATIVE

## 2016-02-04 ENCOUNTER — Telehealth: Payer: Self-pay | Admitting: Obstetrics and Gynecology

## 2016-02-04 NOTE — Telephone Encounter (Signed)
Dr. Glo Herring, will you send in the Clomid for this patient?

## 2016-02-05 ENCOUNTER — Other Ambulatory Visit: Payer: Self-pay | Admitting: Obstetrics and Gynecology

## 2016-02-05 MED ORDER — CLOMIPHENE CITRATE 50 MG PO TABS: 50.0000 mg | ORAL_TABLET | Freq: Every day | ORAL | Status: DC

## 2016-02-06 ENCOUNTER — Telehealth: Payer: Self-pay | Admitting: Obstetrics and Gynecology

## 2016-02-06 NOTE — Telephone Encounter (Signed)
LMOM for pt to check with her pharmacy, and to call back if there were any questions.

## 2016-02-20 ENCOUNTER — Ambulatory Visit (INDEPENDENT_AMBULATORY_CARE_PROVIDER_SITE_OTHER): Payer: 59 | Admitting: Obstetrics and Gynecology

## 2016-02-20 ENCOUNTER — Encounter: Payer: Self-pay | Admitting: Obstetrics and Gynecology

## 2016-02-20 VITALS — BP 120/80 | Ht 70.0 in | Wt 216.0 lb

## 2016-02-20 DIAGNOSIS — N970 Female infertility associated with anovulation: Secondary | ICD-10-CM | POA: Diagnosis not present

## 2016-02-20 DIAGNOSIS — N926 Irregular menstruation, unspecified: Secondary | ICD-10-CM

## 2016-02-20 NOTE — Progress Notes (Signed)
Patient ID: Tammy Chapman, female   DOB: 11-04-1990, 25 y.o.   MRN: [id number]   [name] Visit  @DATE @            Patient name: Tammy Chapman MRN [id number]  Date of birth: [birth year]  CC & HPI:  Tammy Chapman is a 25 y.o. female presenting today for Follow-up first dose of Clomid, 50 mg daily times June 21-25. She had a Urine LH surge on 02/18/2016 so we'll check a serum progesterone level on 7/10  ROS:  ROS   Pertinent History Reviewed:   Reviewed: Significant for History of anovulation Medical         Past Medical History  Diagnosis Date  . GERD (gastroesophageal reflux disease)   . Loose body of right knee 02/2013  . Sprain and strain of medial collateral ligament of knee 02/2013    right  . Mosquito bite 03/08/2013                              Surgical Hx:    Past Surgical History  Procedure Laterality Date  . Esophagogastroduodenoscopy  05/15/2011    Procedure: ESOPHAGOGASTRODUODENOSCOPY (EGD);  Surgeon: Rogene Houston, MD;  [facility];  Service: Endoscopy;  Laterality: N/A;  3:15   . Cholecystectomy  01/20/2008  . Tympanostomy tube placement  1999, 2011  . Tonsillectomy and adenoidectomy  1999  . Ear tube removal  2002  . Knee arthroscopy Right 03/15/2013    Procedure: RIGHT  KNEE ARTHROSCOPY, FEMORAL PATELLA REEFING, LATERAL RELEASE, PARTIAL LATERAL MENISCECTOMY ;  Surgeon: Lorn Junes, MD;  Location: [place];  Service: Orthopedics;  Laterality: Right;   Medications: Reviewed & Updated - see associated section                       Current outpatient prescriptions:  .  cetirizine (ZYRTEC) 10 MG tablet, Take 10 mg by mouth daily., Disp: , Rfl:  .  clomiPHENE (CLOMID) 50 MG tablet, Take 1 tablet (50 mg total) by mouth daily. For 5 days, Disp: 5 tablet, Rfl: 2 .  fluticasone (FLONASE) 50 MCG/ACT nasal spray, Place into both nostrils daily., Disp: , Rfl:    Social History: Reviewed -  reports that she has never smoked. She  has never used smokeless tobacco.  Objective Findings:  Vitals: Blood pressure 120/80, height 5\' 10"  (1.778 m), weight 216 lb (97.977 kg), last menstrual period 12/14/2015.  Physical Examination: Discussion only   Assessment & Plan:   A: Apparent ovulation 1. We'll check if progesterone level increases on 7/10  P:  1. Serum progesterone 7/10

## 2016-02-25 DIAGNOSIS — N970 Female infertility associated with anovulation: Secondary | ICD-10-CM | POA: Diagnosis not present

## 2016-02-26 LAB — PROGESTERONE: Progesterone: 11.1 ng/mL

## 2016-03-19 ENCOUNTER — Ambulatory Visit (INDEPENDENT_AMBULATORY_CARE_PROVIDER_SITE_OTHER): Payer: 59 | Admitting: Obstetrics and Gynecology

## 2016-03-19 ENCOUNTER — Encounter: Payer: Self-pay | Admitting: Obstetrics and Gynecology

## 2016-03-19 VITALS — BP 118/76 | Ht 70.0 in | Wt 219.5 lb

## 2016-03-19 DIAGNOSIS — N926 Irregular menstruation, unspecified: Secondary | ICD-10-CM

## 2016-03-19 DIAGNOSIS — N970 Female infertility associated with anovulation: Secondary | ICD-10-CM

## 2016-03-19 NOTE — Progress Notes (Signed)
Patient ID: [name], female   DOB: 01-01-1991, 25 y.o.   MRN: [id number]    [facility] Visit  @DATE @            Patient name: Tammy Chapman MRN [id number]  Date of birth: [birth year]  CC & HPI:  Tammy Chapman is a 25 y.o. female G0P0 presenting today for f/u of irregular menses and suspected anovulation. Pt has previously taken qd 50 mg Clomid from 02/06/16-02/10/16.  Serum progesterone level on 02/25/16 was 11.1 ng/mL. Per pt, her LMP began on 03/05/16. She reports her period was light on the 19th and then became very heavy on the 20th. She states she has been using OTC urine ovulation kits and reports they have come back negative so far. Pt states she is currently using the app Glow to track her periods.    ROS:  Review of Systems  Constitutional:       No complaints     Pertinent History Reviewed:   Reviewed  Medical         Past Medical History:  Diagnosis Date  . GERD (gastroesophageal reflux disease)   . Loose body of right knee 02/2013  . Mosquito bite 03/08/2013  . Sprain and strain of medial collateral ligament of knee 02/2013   right                              Surgical Hx:    Past Surgical History:  Procedure Laterality Date  . CHOLECYSTECTOMY  01/20/2008  . EAR TUBE REMOVAL  2002  . ESOPHAGOGASTRODUODENOSCOPY  05/15/2011   Procedure: ESOPHAGOGASTRODUODENOSCOPY (EGD);  Surgeon: Rogene Houston, MD;  [facility];  Service: Endoscopy;  Laterality: N/A;  3:15   . KNEE ARTHROSCOPY Right 03/15/2013   Procedure: RIGHT  KNEE ARTHROSCOPY, FEMORAL PATELLA REEFING, LATERAL RELEASE, PARTIAL LATERAL MENISCECTOMY ;  Surgeon: Lorn Junes, MD;  Location: [place];  Service: Orthopedics;  Laterality: Right;  . TONSILLECTOMY AND ADENOIDECTOMY  1999  . TYMPANOSTOMY TUBE PLACEMENT  1999, 2011   Medications: Reviewed & Updated - see associated section                       Current Outpatient Prescriptions:  .  clomiPHENE (CLOMID) 50 MG tablet,  Take 1 tablet (50 mg total) by mouth daily. For 5 days, Disp: 5 tablet, Rfl: 2 .  cetirizine (ZYRTEC) 10 MG tablet, Take 10 mg by mouth daily., Disp: , Rfl:  .  fluticasone (FLONASE) 50 MCG/ACT nasal spray, Place into both nostrils daily., Disp: , Rfl:    Social History: Reviewed -  reports that she has never smoked. She has never used smokeless tobacco.  Objective Findings:  Vitals: Blood pressure 118/76, height 5\' 10"  (1.778 m), weight 219 lb 8 oz (99.6 kg), last menstrual period 03/05/2016.  Physical Examination: Discussion only    Assessment & Plan:   A:  1. Suspected anovulation   P:  1. Continue OTC urine ovulation kits 2. Continue Clomid AT 50 MG/D X DAYS 3-7 3. Order serum progesterone 03/26/16  4. F/u in 1 month AND BY MYCHART     By signing my name below, I, Hansel Feinstein, attest that this documentation has been prepared under the direction and in the presence of Jonnie Kind, MD. Electronically Signed: Hansel Feinstein, ED Scribe. 03/19/16. 8:56 AM.  I personally performed the services  described in this documentation, which was SCRIBED in my presence. The recorded information has been reviewed and considered accurate. It has been edited as necessary during review. Jonnie Kind, MD

## 2016-03-19 NOTE — Patient Instructions (Signed)
Please remember to check out [email or web address]

## 2016-03-26 DIAGNOSIS — N970 Female infertility associated with anovulation: Secondary | ICD-10-CM | POA: Diagnosis not present

## 2016-03-27 LAB — PROGESTERONE: Progesterone: 12.1 ng/mL

## 2016-04-16 ENCOUNTER — Ambulatory Visit (INDEPENDENT_AMBULATORY_CARE_PROVIDER_SITE_OTHER): Payer: 59 | Admitting: Obstetrics and Gynecology

## 2016-04-16 ENCOUNTER — Encounter: Payer: Self-pay | Admitting: Obstetrics and Gynecology

## 2016-04-16 ENCOUNTER — Telehealth: Payer: Self-pay | Admitting: Obstetrics and Gynecology

## 2016-04-16 VITALS — BP 118/60 | HR 83 | Ht 70.0 in | Wt 217.4 lb

## 2016-04-16 DIAGNOSIS — N970 Female infertility associated with anovulation: Secondary | ICD-10-CM

## 2016-04-16 DIAGNOSIS — N926 Irregular menstruation, unspecified: Secondary | ICD-10-CM

## 2016-04-16 MED ORDER — CLOMIPHENE CITRATE 50 MG PO TABS: 50.0000 mg | ORAL_TABLET | Freq: Every day | ORAL | 3 refills | Status: DC

## 2016-04-16 NOTE — Telephone Encounter (Signed)
Pt called stating that Dr. Glo Herring has recommended for her husband to see another Doctor for hiimself, but their next appointment is in October. She would like to know if he could recommend another place for him to go. Please contact pt

## 2016-04-16 NOTE — Progress Notes (Signed)
[facility] Visit  04/16/16          Patient name: Tammy Chapman MRN [id number]  Date of birth: 1991-01-19  CC & HPI:  Tammy Chapman is a 25 y.o. female presenting today for infertility issues onset 2 months. Pt is G0P0 at this time. Patient's last menstrual period was 04/05/2016. Pt states that she has been taking 50 mg Clomid daily with her last dose being 04/07/2016-04/12/2016. Pt reports that she is using a fertility app, Glow to test her ovulation. Pt denies having her sexual partner tested for sperm count. Pt notes that at age 28 she had an ovarian cyst rupture and she was evaluated by an OB-GYN and received gardasil injections. Pt voices concern for if these gardasil injection are affecting her current infertility issues. Pt denies any other symptoms. Pt states that she had a cholecystectomy at 25 years old.   ROS:  ROS Constitutional: negative Genitourinary:negative  Pertinent History Reviewed:   Reviewed: Significant for  Medical         Past Medical History:  Diagnosis Date  . GERD (gastroesophageal reflux disease)   . Loose body of right knee 02/2013  . Mosquito bite 03/08/2013  . Sprain and strain of medial collateral ligament of knee 02/2013   right                              Surgical Hx:    Past Surgical History:  Procedure Laterality Date  . CHOLECYSTECTOMY  01/20/2008  . EAR TUBE REMOVAL  2002  . ESOPHAGOGASTRODUODENOSCOPY  05/15/2011   Procedure: ESOPHAGOGASTRODUODENOSCOPY (EGD);  Surgeon: Rogene Houston, MD;  [facility];  Service: Endoscopy;  Laterality: N/A;  3:15   . KNEE ARTHROSCOPY Right 03/15/2013   Procedure: RIGHT  KNEE ARTHROSCOPY, FEMORAL PATELLA REEFING, LATERAL RELEASE, PARTIAL LATERAL MENISCECTOMY ;  Surgeon: Lorn Junes, MD;  Location: [place];  Service: Orthopedics;  Laterality: Right;  . TONSILLECTOMY AND ADENOIDECTOMY  1999  . TYMPANOSTOMY TUBE PLACEMENT  1999, 2011   Medications: Reviewed & Updated - see  associated section                       Current Outpatient Prescriptions:  .  cetirizine (ZYRTEC) 10 MG tablet, Take 10 mg by mouth daily., Disp: , Rfl:  .  clomiPHENE (CLOMID) 50 MG tablet, Take 1 tablet (50 mg total) by mouth daily. For 5 days, Disp: 5 tablet, Rfl: 2 .  fluticasone (FLONASE) 50 MCG/ACT nasal spray, Place into both nostrils daily., Disp: , Rfl:    Social History: Reviewed -  reports that she has never smoked. She has never used smokeless tobacco.  Objective Findings:  Vitals: Blood pressure 118/60, pulse 83, height 5\' 10"  (1.778 m), weight 217 lb 6.4 oz (98.6 kg), last menstrual period 04/05/2016.   Discussed with pt risks and benefits of ovulation and fertility issues. At end of discussion, pt had opportunity to ask questions and has no further questions at this time.   Greater than 50% was spent in counseling and coordination of care with the patient. Face-to-Face time: 25 minutes   Assessment & Plan:   A:  1. Primary infertility  P:  1. Refill and continue 50 mg Clomid daily x days 3-7  2. Continue using Glow fertility app 3. Follow up in 3 months or PRN   By signing my name  below, I, Soijett Blue, attest that this documentation has been prepared under the direction and in the presence of Jonnie Kind, MD. Electronically Signed: Soijett Blue, ED Scribe. 04/16/16. 9:22 AM.   I personally performed the services described in this documentation, which was SCRIBED in my presence. The recorded information has been reviewed and considered accurate. It has been edited as necessary during review. Jonnie Kind, MD

## 2016-05-07 ENCOUNTER — Ambulatory Visit (INDEPENDENT_AMBULATORY_CARE_PROVIDER_SITE_OTHER): Payer: 59 | Admitting: Adult Health

## 2016-05-07 ENCOUNTER — Encounter: Payer: Self-pay | Admitting: Adult Health

## 2016-05-07 VITALS — BP 110/70 | HR 118 | Ht 70.0 in | Wt 220.0 lb

## 2016-05-07 DIAGNOSIS — O3680X0 Pregnancy with inconclusive fetal viability, not applicable or unspecified: Secondary | ICD-10-CM

## 2016-05-07 DIAGNOSIS — H903 Sensorineural hearing loss, bilateral: Secondary | ICD-10-CM | POA: Diagnosis not present

## 2016-05-07 DIAGNOSIS — R110 Nausea: Secondary | ICD-10-CM | POA: Diagnosis not present

## 2016-05-07 DIAGNOSIS — H6063 Unspecified chronic otitis externa, bilateral: Secondary | ICD-10-CM | POA: Diagnosis not present

## 2016-05-07 DIAGNOSIS — Z3201 Encounter for pregnancy test, result positive: Secondary | ICD-10-CM

## 2016-05-07 DIAGNOSIS — N926 Irregular menstruation, unspecified: Secondary | ICD-10-CM

## 2016-05-07 DIAGNOSIS — H6983 Other specified disorders of Eustachian tube, bilateral: Secondary | ICD-10-CM | POA: Diagnosis not present

## 2016-05-07 DIAGNOSIS — Z3490 Encounter for supervision of normal pregnancy, unspecified, unspecified trimester: Secondary | ICD-10-CM

## 2016-05-07 LAB — POCT URINE PREGNANCY: Preg Test, Ur: POSITIVE — AB

## 2016-05-07 NOTE — Progress Notes (Signed)
Subjective:     Patient ID: Tammy Chapman, female   DOB: January 27, 1991, 25 y.o.   MRN: [id number]  HPI Tammy Chapman is a 25 year old white female in for UPT, she has missed a period, has had 3 rounds of Clomid 50 mg. She has nausea, and denies any bleeding.  Review of Systems +missed period  +nausea No bleeding  Reviewed past medical,surgical, social and family history. Reviewed medications and allergies.     Objective:   Physical Exam BP 110/70 (BP Location: Left Arm, Patient Position: Sitting, Cuff Size: Large)   Pulse (!) 118   Ht 5\' 10"  (1.778 m)   Wt 220 lb (99.8 kg)   LMP 04/05/2016 (Exact Date)   BMI 31.57 kg/m +UPT about 4+4 weeks, by LMP with EDD 01/10/17. Skin warm and dry. Neck: mid line trachea, normal thyroid, good ROM, no lymphadenopathy noted. Lungs: clear to ausculation bilaterally. Cardiovascular: regular rate and rhythm.Abdomen is soft and non tender.    Assessment:     1. Pregnancy examination or test, positive result   2. Pregnant   3. Pregnancy with inconclusive fetal viability, not applicable or unspecified fetus       Plan:        Check QHCG and progesterone level Return in 2 weeks for [place]  Eat often Review handout on first trimester

## 2016-05-07 NOTE — Patient Instructions (Signed)
First Trimester of Pregnancy The first trimester of pregnancy is from week 1 until the end of week 12 (months 1 through 3). A week after a sperm fertilizes an egg, the egg will implant on the wall of the uterus. This embryo will begin to develop into a baby. Genes from you and your partner are forming the baby. The female genes determine whether the baby is a boy or a girl. At 6-8 weeks, the eyes and face are formed, and the heartbeat can be seen on ultrasound. At the end of 12 weeks, all the baby's organs are formed.  Now that you are pregnant, you will want to do everything you can to have a healthy baby. Two of the most important things are to get good prenatal care and to follow your health care provider's instructions. Prenatal care is all the medical care you receive before the baby's birth. This care will help prevent, find, and treat any problems during the pregnancy and childbirth. BODY CHANGES Your body goes through many changes during pregnancy. The changes vary from woman to woman.   You may gain or lose a couple of pounds at first.  You may feel sick to your stomach (nauseous) and throw up (vomit). If the vomiting is uncontrollable, call your health care provider.  You may tire easily.  You may develop headaches that can be relieved by medicines approved by your health care provider.  You may urinate more often. Painful urination may mean you have a bladder infection.  You may develop heartburn as a result of your pregnancy.  You may develop constipation because certain hormones are causing the muscles that push waste through your intestines to slow down.  You may develop hemorrhoids or swollen, bulging veins (varicose veins).  Your breasts may begin to grow larger and become tender. Your nipples may stick out more, and the tissue that surrounds them (areola) may become darker.  Your gums may bleed and may be sensitive to brushing and flossing.  Dark spots or blotches (chloasma,  mask of pregnancy) may develop on your face. This will likely fade after the baby is born.  Your menstrual periods will stop.  You may have a loss of appetite.  You may develop cravings for certain kinds of food.  You may have changes in your emotions from day to day, such as being excited to be pregnant or being concerned that something may go wrong with the pregnancy and baby.  You may have more vivid and strange dreams.  You may have changes in your hair. These can include thickening of your hair, rapid growth, and changes in texture. Some women also have hair loss during or after pregnancy, or hair that feels dry or thin. Your hair will most likely return to normal after your baby is born. WHAT TO EXPECT AT YOUR PRENATAL VISITS During a routine prenatal visit:  You will be weighed to make sure you and the baby are growing normally.  Your blood pressure will be taken.  Your abdomen will be measured to track your baby's growth.  The fetal heartbeat will be listened to starting around week 10 or 12 of your pregnancy.  Test results from any previous visits will be discussed. Your health care provider may ask you:  How you are feeling.  If you are feeling the baby move.  If you have had any abnormal symptoms, such as leaking fluid, bleeding, severe headaches, or abdominal cramping.  If you are using any tobacco products,   including cigarettes, chewing tobacco, and electronic cigarettes.  If you have any questions. Other tests that may be performed during your first trimester include:  Blood tests to find your blood type and to check for the presence of any previous infections. They will also be used to check for low iron levels (anemia) and Rh antibodies. Later in the pregnancy, blood tests for diabetes will be done along with other tests if problems develop.  Urine tests to check for infections, diabetes, or protein in the urine.  An ultrasound to confirm the proper growth  and development of the baby.  An amniocentesis to check for possible genetic problems.  Fetal screens for spina bifida and Down syndrome.  You may need other tests to make sure you and the baby are doing well.  HIV (human immunodeficiency virus) testing. Routine prenatal testing includes screening for HIV, unless you choose not to have this test. HOME CARE INSTRUCTIONS  Medicines  Follow your health care provider's instructions regarding medicine use. Specific medicines may be either safe or unsafe to take during pregnancy.  Take your prenatal vitamins as directed.  If you develop constipation, try taking a stool softener if your health care provider approves. Diet  Eat regular, well-balanced meals. Choose a variety of foods, such as meat or vegetable-based protein, fish, milk and low-fat dairy products, vegetables, fruits, and whole grain breads and cereals. Your health care provider will help you determine the amount of weight gain that is right for you.  Avoid raw meat and uncooked cheese. These carry germs that can cause birth defects in the baby.  Eating four or five small meals rather than three large meals a day may help relieve nausea and vomiting. If you start to feel nauseous, eating a few soda crackers can be helpful. Drinking liquids between meals instead of during meals also seems to help nausea and vomiting.  If you develop constipation, eat more high-fiber foods, such as fresh vegetables or fruit and whole grains. Drink enough fluids to keep your urine clear or pale yellow. Activity and Exercise  Exercise only as directed by your health care provider. Exercising will help you:  Control your weight.  Stay in shape.  Be prepared for labor and delivery.  Experiencing pain or cramping in the lower abdomen or low back is a good sign that you should stop exercising. Check with your health care provider before continuing normal exercises.  Try to avoid standing for long  periods of time. Move your legs often if you must stand in one place for a long time.  Avoid heavy lifting.  Wear low-heeled shoes, and practice good posture.  You may continue to have sex unless your health care provider directs you otherwise. Relief of Pain or Discomfort  Wear a good support bra for breast tenderness.   Take warm sitz baths to soothe any pain or discomfort caused by hemorrhoids. Use hemorrhoid cream if your health care provider approves.   Rest with your legs elevated if you have leg cramps or low back pain.  If you develop varicose veins in your legs, wear support hose. Elevate your feet for 15 minutes, 3-4 times a day. Limit salt in your diet. Prenatal Care  Schedule your prenatal visits by the twelfth week of pregnancy. They are usually scheduled monthly at first, then more often in the last 2 months before delivery.  Write down your questions. Take them to your prenatal visits.  Keep all your prenatal visits as directed by your   health care provider. Safety  Wear your seat belt at all times when driving.  Make a list of emergency phone numbers, including numbers for family, friends, the hospital, and police and fire departments. General Tips  Ask your health care provider for a referral to a local prenatal education class. Begin classes no later than at the beginning of month 6 of your pregnancy.  Ask for help if you have counseling or nutritional needs during pregnancy. Your health care provider can offer advice or refer you to specialists for help with various needs.  Do not use hot tubs, steam rooms, or saunas.  Do not douche or use tampons or scented sanitary pads.  Do not cross your legs for long periods of time.  Avoid cat litter boxes and soil used by cats. These carry germs that can cause birth defects in the baby and possibly loss of the fetus by miscarriage or stillbirth.  Avoid all smoking, herbs, alcohol, and medicines not prescribed by  your health care provider. Chemicals in these affect the formation and growth of the baby.  Do not use any tobacco products, including cigarettes, chewing tobacco, and electronic cigarettes. If you need help quitting, ask your health care provider. You may receive counseling support and other resources to help you quit.  Schedule a dentist appointment. At home, brush your teeth with a soft toothbrush and be gentle when you floss. SEEK MEDICAL CARE IF:   You have dizziness.  You have mild pelvic cramps, pelvic pressure, or nagging pain in the abdominal area.  You have persistent nausea, vomiting, or diarrhea.  You have a bad smelling vaginal discharge.  You have pain with urination.  You notice increased swelling in your face, hands, legs, or ankles. SEEK IMMEDIATE MEDICAL CARE IF:   You have a fever.  You are leaking fluid from your vagina.  You have spotting or bleeding from your vagina.  You have severe abdominal cramping or pain.  You have rapid weight gain or loss.  You vomit blood or material that looks like coffee grounds.  You are exposed to [place] measles and have never had them.  You are exposed to fifth disease or chickenpox.  You develop a severe headache.  You have shortness of breath.  You have any kind of trauma, such as from a fall or a car accident.   This information is not intended to replace advice given to you by your health care provider. Make sure you discuss any questions you have with your health care provider.   Document Released: 07/29/2001 Document Revised: 08/25/2014 Document Reviewed: 06/14/2013 Elsevier Interactive Patient Education [employer]. Return in 2 weeks for [place]

## 2016-05-08 LAB — PROGESTERONE: Progesterone: 17.4 ng/mL

## 2016-05-08 LAB — BETA HCG QUANT (REF LAB): hCG Quant: 258 m[IU]/mL

## 2016-05-21 ENCOUNTER — Ambulatory Visit (INDEPENDENT_AMBULATORY_CARE_PROVIDER_SITE_OTHER): Payer: 59

## 2016-05-21 DIAGNOSIS — O3680X0 Pregnancy with inconclusive fetal viability, not applicable or unspecified: Secondary | ICD-10-CM | POA: Diagnosis not present

## 2016-05-21 DIAGNOSIS — Z3A01 Less than 8 weeks gestation of pregnancy: Secondary | ICD-10-CM | POA: Diagnosis not present

## 2016-05-21 NOTE — Progress Notes (Signed)
[place] 6+4 wks,single IUP w/ys,pos fht 130 bpm,normal ov's bilat,crl 4.5 mm

## 2016-05-30 ENCOUNTER — Telehealth: Payer: Self-pay | Admitting: Obstetrics and Gynecology

## 2016-05-30 MED ORDER — ONDANSETRON 4 MG PO TBDP: 4.0000 mg | ORAL_TABLET | Freq: Three times a day (TID) | ORAL | 3 refills | Status: DC | PRN

## 2016-05-30 NOTE — Telephone Encounter (Signed)
Pt called stating that she is pregnant and is having a hard time keeping anything down. Pt would like to know if we could call her in something for her nausea. Pt uses [facility]. Please contact pt

## 2016-05-30 NOTE — Telephone Encounter (Signed)
Pt complains of nausea,vomiting and diarrhea, will rx zofran

## 2016-05-30 NOTE — Telephone Encounter (Signed)
Pt c/o nausea/vomiting early pregnancy. Pt requesting a Rx.

## 2016-06-04 ENCOUNTER — Ambulatory Visit (INDEPENDENT_AMBULATORY_CARE_PROVIDER_SITE_OTHER): Payer: 59 | Admitting: Women's Health

## 2016-06-04 ENCOUNTER — Encounter: Payer: Self-pay | Admitting: Women's Health

## 2016-06-04 ENCOUNTER — Other Ambulatory Visit (HOSPITAL_COMMUNITY)
Admission: RE | Admit: 2016-06-04 | Discharge: 2016-06-04 | Disposition: A | Discharge status: Home / Self Care | Payer: 59 | Source: Ambulatory Visit | Attending: Obstetrics & Gynecology | Admitting: Obstetrics & Gynecology

## 2016-06-04 VITALS — BP 130/84 | HR 96 | Wt 225.0 lb

## 2016-06-04 DIAGNOSIS — Z113 Encounter for screening for infections with a predominantly sexual mode of transmission: Secondary | ICD-10-CM | POA: Diagnosis present

## 2016-06-04 DIAGNOSIS — Z3401 Encounter for supervision of normal first pregnancy, first trimester: Secondary | ICD-10-CM | POA: Diagnosis not present

## 2016-06-04 DIAGNOSIS — Z1389 Encounter for screening for other disorder: Secondary | ICD-10-CM

## 2016-06-04 DIAGNOSIS — Z01419 Encounter for gynecological examination (general) (routine) without abnormal findings: Secondary | ICD-10-CM | POA: Insufficient documentation

## 2016-06-04 DIAGNOSIS — Z124 Encounter for screening for malignant neoplasm of cervix: Secondary | ICD-10-CM

## 2016-06-04 DIAGNOSIS — Z3A09 9 weeks gestation of pregnancy: Secondary | ICD-10-CM

## 2016-06-04 DIAGNOSIS — Z0283 Encounter for blood-alcohol and blood-drug test: Secondary | ICD-10-CM

## 2016-06-04 DIAGNOSIS — Z3400 Encounter for supervision of normal first pregnancy, unspecified trimester: Secondary | ICD-10-CM | POA: Insufficient documentation

## 2016-06-04 DIAGNOSIS — Z331 Pregnant state, incidental: Secondary | ICD-10-CM

## 2016-06-04 DIAGNOSIS — O210 Mild hyperemesis gravidarum: Secondary | ICD-10-CM

## 2016-06-04 DIAGNOSIS — O26892 Other specified pregnancy related conditions, second trimester: Secondary | ICD-10-CM | POA: Diagnosis not present

## 2016-06-04 LAB — POCT URINALYSIS DIPSTICK
Blood, UA: NEGATIVE
Glucose, UA: NEGATIVE
Ketones, UA: NEGATIVE
Leukocytes, UA: NEGATIVE
Nitrite, UA: NEGATIVE
Protein, UA: NEGATIVE

## 2016-06-04 MED ORDER — PROMETHAZINE HCL 25 MG PO TABS: 12.5000 mg | ORAL_TABLET | Freq: Four times a day (QID) | ORAL | 0 refills | Status: DC | PRN

## 2016-06-04 NOTE — Progress Notes (Signed)
Subjective:  Tammy Chapman is a 25 y.o. G72P0 Caucasian female at [redacted]w[redacted]d by LMP c/w 6wk u/s, being seen today for her first obstetrical visit.  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  Pregnancy history fully reviewed.  Patient reports continued nausea- zofran previously rx'd didn't help so she stopped taking; constipation from zofran. Denies vb, cramping, uti s/s, abnormal/malodorous vag d/c, or vulvovaginal itching/irritation.  BP 130/84   Pulse 96   Wt 225 lb (102.1 kg)   LMP 04/05/2016 (Exact Date)   BMI 32.28 kg/m   HISTORY: OB History  Gravida Para Term Preterm AB Living  1            SAB TAB Ectopic Multiple Live Births               # Outcome Date GA Lbr Len/2nd Weight Sex Delivery Anes PTL Lv  1 Current              Past Medical History:  Diagnosis Date  . GERD (gastroesophageal reflux disease)   . Loose body of right knee 02/2013  . Mosquito bite 03/08/2013  . Sprain and strain of medial collateral ligament of knee 02/2013   right   Past Surgical History:  Procedure Laterality Date  . CHOLECYSTECTOMY  01/20/2008  . EAR TUBE REMOVAL  2002  . ESOPHAGOGASTRODUODENOSCOPY  05/15/2011   Procedure: ESOPHAGOGASTRODUODENOSCOPY (EGD);  Surgeon: Rogene Houston, MD;  Location: [facility];  Service: Endoscopy;  Laterality: N/A;  3:15   . KNEE ARTHROSCOPY Right 03/15/2013   Procedure: RIGHT  KNEE ARTHROSCOPY, FEMORAL PATELLA REEFING, LATERAL RELEASE, PARTIAL LATERAL MENISCECTOMY ;  Surgeon: Lorn Junes, MD;  Location: [facility];  Service: Orthopedics;  Laterality: Right;  . TONSILLECTOMY AND ADENOIDECTOMY  1999  . TYMPANOSTOMY TUBE PLACEMENT  1999, 2011   Family History  Problem Relation Age of Onset  . Diabetes Mother   . Cancer Paternal Grandfather     renal  . Diabetes Maternal Grandfather   . COPD Maternal Grandfather   . Congestive Heart Failure Maternal Grandfather     Exam    System:     General: Well developed & nourished, no acute distress   Skin: Warm & dry, normal coloration and turgor, no rashes   Neurologic: Alert & oriented, normal mood   Cardiovascular: Regular rate & rhythm   Respiratory: Effort & rate normal, LCTAB, acyanotic   Abdomen: Soft, non tender   Extremities: normal strength, tone   Pelvic Exam:    Perineum: Normal perineum   Vulva: Normal, no lesions   Vagina:  Normal mucosa, normal discharge   Cervix: Normal, bulbous, appears closed   Uterus: Normal size/shape/contour for GA   Thin prep pap smear obtained w/ reflex high risk HPV cotesting FHR: + via informal u/s   Assessment:   Pregnancy: G1P0 Patient Active Problem List   Diagnosis Date Noted  . Supervision of normal first pregnancy 06/04/2016    Priority: High  . 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 Anovulation 02/01/2016  . Knee pain 04/08/2012  . Knee stiffness 04/08/2012  . Difficulty in walking(719.7) 04/08/2012  . Abnormality of gait 04/08/2012  . GERD (gastroesophageal reflux disease) 08/04/2011  . Abdominal pain 08/04/2011  . Diarrhea 08/04/2011    [redacted]w[redacted]d G1P0 New OB visit Nausea of pregnancy Constipation H/O infertility, conceived on clomid  Plan:  Initial labs drawn Continue prenatal vitamins Problem list reviewed and updated Reviewed  n/v relief measures and warning s/s to report Reviewed recommended weight gain based on pre-gravid BMI Encouraged well-balanced diet Genetic Screening discussed Integrated Screen: declines right now, wants to discuss further w/ fob, to call back asap to schedule w/ next appt if decides she wants Cystic fibrosis screening discussed declined Ultrasound discussed; fetal survey: requested Follow up in 4 weeks for visit [place] completed Declined flu shot Stop zofran, allergic to benadryl-sob so won't rx diclegis, rx'd phenergan today Gave printed prevention/relief measures for constipation  Tawnya Crook CNM, [facility] 06/04/2016 11:51 AM

## 2016-06-04 NOTE — Patient Instructions (Signed)

## 2016-06-05 LAB — RUBELLA SCREEN: Rubella Antibodies, IGG: 4.68 index (ref >0.99)

## 2016-06-05 LAB — ABO/RH: Rh Factor: POSITIVE

## 2016-06-05 LAB — HIV ANTIBODY (ROUTINE TESTING W REFLEX): HIV Screen 4th Generation wRfx: NONREACTIVE

## 2016-06-05 LAB — URINALYSIS, ROUTINE W REFLEX MICROSCOPIC
Bilirubin, UA: NEGATIVE
Glucose, UA: NEGATIVE
Ketones, UA: NEGATIVE
Leukocytes, UA: NEGATIVE
Nitrite, UA: NEGATIVE
Protein, UA: NEGATIVE
RBC, UA: NEGATIVE
Specific Gravity, UA: 1.011 (ref 1.005–1.030)
Urobilinogen, Ur: 0.2 mg/dL (ref 0.2–1.0)
pH, UA: 6.5 (ref 5.0–7.5)

## 2016-06-05 LAB — CBC
Hematocrit: 39.6 % (ref 34.0–46.6)
Hemoglobin: 13.3 g/dL (ref 11.1–15.9)
MCH: 28.7 pg (ref 26.6–33.0)
MCHC: 33.6 g/dL (ref 31.5–35.7)
MCV: 86 fL (ref 79–97)
Platelets: 264 10*3/uL (ref 150–379)
RBC: 4.63 x10E6/uL (ref 3.77–5.28)
RDW: 13.5 % (ref 12.3–15.4)
WBC: 8.8 10*3/uL (ref 3.4–10.8)

## 2016-06-05 LAB — PMP SCREEN PROFILE (10S), URINE
Amphetamine Screen, Ur: NEGATIVE ng/mL
Barbiturate Screen, Ur: NEGATIVE ng/mL
Benzodiazepine Screen, Urine: NEGATIVE ng/mL
Cannabinoids Ur Ql Scn: NEGATIVE ng/mL
Cocaine(Metab.)Screen, Urine: NEGATIVE ng/mL
Creatinine(Crt), U: 47.1 mg/dL (ref 20.0–300.0)
Methadone Scn, Ur: NEGATIVE ng/mL
Opiate Scrn, Ur: NEGATIVE ng/mL
Oxycodone+Oxymorphone Ur Ql Scn: NEGATIVE ng/mL
PCP Scrn, Ur: NEGATIVE ng/mL
Ph of Urine: 5.5 (ref 4.5–8.9)
Propoxyphene, Screen: NEGATIVE ng/mL

## 2016-06-05 LAB — VARICELLA ZOSTER ANTIBODY, IGG: Varicella zoster IgG: 356 index (ref >165)

## 2016-06-05 LAB — HEPATITIS B SURFACE ANTIGEN: Hepatitis B Surface Ag: NEGATIVE

## 2016-06-05 LAB — ANTIBODY SCREEN: Antibody Screen: NEGATIVE

## 2016-06-05 LAB — RPR: RPR Ser Ql: NONREACTIVE

## 2016-06-06 LAB — CYTOLOGY - PAP
Chlamydia: NEGATIVE
Diagnosis: NEGATIVE
Neisseria Gonorrhea: NEGATIVE

## 2016-06-06 LAB — URINE CULTURE

## 2016-06-17 ENCOUNTER — Telehealth: Payer: Self-pay | Admitting: Women's Health

## 2016-06-17 ENCOUNTER — Encounter: Payer: Self-pay | Admitting: Women's Health

## 2016-06-17 ENCOUNTER — Other Ambulatory Visit: Payer: Self-pay | Admitting: Women's Health

## 2016-06-17 NOTE — Telephone Encounter (Signed)
Patient called stating she has had a migraine since Sunday in which it is making her nauseated, dizzy and is affecting her AODL.  She has taken Tylenol but it has not helped. She has a prescription for Frova but doesn't know if she can take it. Please advise. She does have an active mychart account so response may be sent to her directly.

## 2016-07-02 ENCOUNTER — Encounter: Payer: Self-pay | Admitting: Advanced Practice Midwife

## 2016-07-02 ENCOUNTER — Ambulatory Visit (INDEPENDENT_AMBULATORY_CARE_PROVIDER_SITE_OTHER): Payer: 59 | Admitting: Advanced Practice Midwife

## 2016-07-02 VITALS — BP 126/62 | HR 100 | Wt 232.0 lb

## 2016-07-02 DIAGNOSIS — Z1389 Encounter for screening for other disorder: Secondary | ICD-10-CM

## 2016-07-02 DIAGNOSIS — Z331 Pregnant state, incidental: Secondary | ICD-10-CM | POA: Diagnosis not present

## 2016-07-02 DIAGNOSIS — H6693 Otitis media, unspecified, bilateral: Secondary | ICD-10-CM | POA: Diagnosis not present

## 2016-07-02 DIAGNOSIS — Z3401 Encounter for supervision of normal first pregnancy, first trimester: Secondary | ICD-10-CM

## 2016-07-02 LAB — POCT URINALYSIS DIPSTICK
Blood, UA: NEGATIVE
Glucose, UA: NEGATIVE
Ketones, UA: NEGATIVE
Leukocytes, UA: NEGATIVE
Nitrite, UA: NEGATIVE
Protein, UA: NEGATIVE

## 2016-07-02 MED ORDER — DOXYLAMINE-PYRIDOXINE 10-10 MG PO TBEC: DELAYED_RELEASE_TABLET | ORAL | 3 refills | Status: DC

## 2016-07-02 MED ORDER — CIPROFLOXACIN-DEXAMETHASONE 0.3-0.1 % OT SUSP: 4.0000 [drp] | Freq: Two times a day (BID) | OTIC | 0 refills | Status: DC

## 2016-07-02 NOTE — Progress Notes (Signed)
G1P0 [redacted]w[redacted]d Estimated Date of Delivery: 01/10/17  Blood pressure 126/62, pulse 100, weight 232 lb (105.2 kg), last menstrual period 04/05/2016.   BP weight and urine results all reviewed and noted.  Please refer to the obstetrical flow sheet for the fundal height and fetal heart rate documentation:  Patient denies any bleeding and no rupture of membranes symptoms or regular contractions. Patient has bilateral ear pain.  Has chronic ear infections, sees ENT regulalry.  Bilateral erythema, sl exudate.  D/T pt's allergies, will rx ciprodex (abx her ENT usually prescribes) . All questions were answered.  Orders Placed This Encounter  Procedures  . POCT urinalysis dipstick    Plan:  Continued routine obstetrical care,   Return in about 4 weeks (around 07/30/2016) for LROB.

## 2016-07-03 DIAGNOSIS — H6693 Otitis media, unspecified, bilateral: Secondary | ICD-10-CM | POA: Insufficient documentation

## 2016-07-09 ENCOUNTER — Encounter: Payer: Self-pay | Admitting: Women's Health

## 2016-07-21 ENCOUNTER — Ambulatory Visit: Payer: 59 | Admitting: Obstetrics and Gynecology

## 2016-07-24 ENCOUNTER — Encounter: Payer: Self-pay | Admitting: Women's Health

## 2016-07-30 ENCOUNTER — Ambulatory Visit (INDEPENDENT_AMBULATORY_CARE_PROVIDER_SITE_OTHER): Payer: 59 | Admitting: Women's Health

## 2016-07-30 ENCOUNTER — Encounter: Payer: Self-pay | Admitting: Women's Health

## 2016-07-30 VITALS — BP 128/66 | HR 80 | Wt 234.0 lb

## 2016-07-30 DIAGNOSIS — Z363 Encounter for antenatal screening for malformations: Secondary | ICD-10-CM

## 2016-07-30 DIAGNOSIS — Z3A17 17 weeks gestation of pregnancy: Secondary | ICD-10-CM

## 2016-07-30 DIAGNOSIS — Z3401 Encounter for supervision of normal first pregnancy, first trimester: Secondary | ICD-10-CM

## 2016-07-30 DIAGNOSIS — Z1389 Encounter for screening for other disorder: Secondary | ICD-10-CM

## 2016-07-30 DIAGNOSIS — Z331 Pregnant state, incidental: Secondary | ICD-10-CM

## 2016-07-30 LAB — POCT URINALYSIS DIPSTICK
Blood, UA: NEGATIVE
Glucose, UA: NEGATIVE
Ketones, UA: NEGATIVE
Leukocytes, UA: NEGATIVE
Nitrite, UA: NEGATIVE
Protein, UA: NEGATIVE

## 2016-07-30 MED ORDER — AZITHROMYCIN 250 MG PO TABS: ORAL_TABLET | ORAL | 0 refills | Status: DC

## 2016-07-30 NOTE — Progress Notes (Signed)
Low-risk OB appointment G1P0 [redacted]w[redacted]d Estimated Date of Delivery: 01/10/17 BP 128/66   Pulse 80   Wt 234 lb (106.1 kg)   LMP 04/05/2016 (Exact Date)   BMI 33.58 kg/m   BP, weight, and urine reviewed.  Refer to obstetrical flow sheet for FH & FHR.  No fm yet. Denies cramping, lof, vb, or uti s/s. Severe cramping LLQ 'in my ovary', points to area of round ligament- but reports pain as constant. Normal bm's. Drinking water all day, urine clear. No vaginal d/c, itching/irritation. Sick x 3wks w/ cold, cough, fever/chills, earaches, etc. Rx z-pak, gave printed otc relief measures.  Sciatica pain- gave printed relief measures ABD: No tenderness to palpation, no abnormalities felt.  Reviewed warning s/s to report. Plan:  Continue routine obstetrical care  F/U in 4wks for OB appointment and anatomy u/s Declines genetic screening

## 2016-07-30 NOTE — Patient Instructions (Addendum)
Humidifier and saline nasal spray for nasal congestion  Regular robitussin, cough drops for cough  Warm salt water gargles for sore throat  Mucinex with lots of water to help you cough up the mucous in your chest if needed  Drink plenty of fluids and stay hydrated!  Wash your hands frequently.  Call if you are not improving by 7-10 days.  Second Trimester of Pregnancy The second trimester is from week 13 through week 28 (months 4 through 6). The second trimester is often a time when you feel your best. Your body has also adjusted to being pregnant, and you begin to feel better physically. Usually, morning sickness has lessened or quit completely, you may have more energy, and you may have an increase in appetite. The second trimester is also a time when the fetus is growing rapidly. At the end of the sixth month, the fetus is about 9 inches long and weighs about 1 pounds. You will likely begin to feel the baby move (quickening) between 18 and 20 weeks of the pregnancy. Body changes during your second trimester Your body continues to go through many changes during your second trimester. The changes vary from woman to woman.  Your weight will continue to increase. You will notice your lower abdomen bulging out.  You may begin to get stretch marks on your hips, abdomen, and breasts.  You may develop headaches that can be relieved by medicines. The medicines should be approved by your health care provider.  You may urinate more often because the fetus is pressing on your bladder.  You may develop or continue to have heartburn as a result of your pregnancy.  You may develop constipation because certain hormones are causing the muscles that push waste through your intestines to slow down.  You may develop hemorrhoids or swollen, bulging veins (varicose veins).  You may have back pain. This is caused by:  Weight gain.  Pregnancy hormones that are relaxing the joints in your  pelvis.  A shift in weight and the muscles that support your balance.  Your breasts will continue to grow and they will continue to become tender.  Your gums may bleed and may be sensitive to brushing and flossing.  Dark spots or blotches (chloasma, mask of pregnancy) may develop on your face. This will likely fade after the baby is born.  A dark line from your belly button to the pubic area (linea nigra) may appear. This will likely fade after the baby is born.  You may have changes in your hair. These can include thickening of your hair, rapid growth, and changes in texture. Some women also have hair loss during or after pregnancy, or hair that feels dry or thin. Your hair will most likely return to normal after your baby is born. What to expect at prenatal visits During a routine prenatal visit:  You will be weighed to make sure you and the fetus are growing normally.  Your blood pressure will be taken.  Your abdomen will be measured to track your baby's growth.  The fetal heartbeat will be listened to.  Any test results from the previous visit will be discussed. Your health care provider may ask you:  How you are feeling.  If you are feeling the baby move.  If you have had any abnormal symptoms, such as leaking fluid, bleeding, severe headaches, or abdominal cramping.  If you are using any tobacco products, including cigarettes, chewing tobacco, and electronic cigarettes.  If you  have any questions. Other tests that may be performed during your second trimester include:  Blood tests that check for:  Low iron levels (anemia).  Gestational diabetes (between 24 and 28 weeks).  Rh antibodies. This is to check for a protein on red blood cells (Rh factor).  Urine tests to check for infections, diabetes, or protein in the urine.  An ultrasound to confirm the proper growth and development of the baby.  An amniocentesis to check for possible genetic problems.  Fetal  screens for spina bifida and Down syndrome.  HIV (human immunodeficiency virus) testing. Routine prenatal testing includes screening for HIV, unless you choose not to have this test. Follow these instructions at home: Eating and drinking  Continue to eat regular, healthy meals.  Avoid raw meat, uncooked cheese, cat litter boxes, and soil used by cats. These carry germs that can cause birth defects in the baby.  Take your prenatal vitamins.  Take 1500-2000 mg of calcium daily starting at the 20th week of pregnancy until you deliver your baby.  If you develop constipation:  Take over-the-counter or prescription medicines.  Drink enough fluid to keep your urine clear or pale yellow.  Eat foods that are high in fiber, such as fresh fruits and vegetables, whole grains, and beans.  Limit foods that are high in fat and processed sugars, such as fried and sweet foods. Activity  Exercise only as directed by your health care provider. Experiencing uterine cramps is a good sign to stop exercising.  Avoid heavy lifting, wear low heel shoes, and practice good posture.  Wear your seat belt at all times when driving.  Rest with your legs elevated if you have leg cramps or low back pain.  Wear a good support bra for breast tenderness.  Do not use hot tubs, steam rooms, or saunas. Lifestyle  Avoid all smoking, herbs, alcohol, and unprescribed drugs. These chemicals affect the formation and growth of the baby.  Do not use any products that contain nicotine or tobacco, such as cigarettes and e-cigarettes. If you need help quitting, ask your health care provider.  A sexual relationship may be continued unless your health care provider directs you otherwise. General instructions  Follow your health care provider's instructions regarding medicine use. There are medicines that are either safe or unsafe to take during pregnancy.  Take warm sitz baths to soothe any pain or discomfort caused by  hemorrhoids. Use hemorrhoid cream if your health care provider approves.  If you develop varicose veins, wear support hose. Elevate your feet for 15 minutes, 3-4 times a day. Limit salt in your diet.  Visit your dentist if you have not gone yet during your pregnancy. Use a soft toothbrush to brush your teeth and be gentle when you floss.  Keep all follow-up prenatal visits as told by your health care provider. This is important. Contact a health care provider if:  You have dizziness.  You have mild pelvic cramps, pelvic pressure, or nagging pain in the abdominal area.  You have persistent nausea, vomiting, or diarrhea.  You have a bad smelling vaginal discharge.  You have pain with urination. Get help right away if:  You have a fever.  You are leaking fluid from your vagina.  You have spotting or bleeding from your vagina.  You have severe abdominal cramping or pain.  You have rapid weight gain or weight loss.  You have shortness of breath with chest pain.  You notice sudden or extreme swelling of your  face, hands, ankles, feet, or legs.  You have not felt your baby move in over an hour.  You have severe headaches that do not go away with medicine.  You have vision changes. Summary  The second trimester is from week 13 through week 28 (months 4 through 6). It is also a time when the fetus is growing rapidly.  Your body goes through many changes during pregnancy. The changes vary from woman to woman.  Avoid all smoking, herbs, alcohol, and unprescribed drugs. These chemicals affect the formation and growth your baby.  Do not use any tobacco products, such as cigarettes, chewing tobacco, and e-cigarettes. If you need help quitting, ask your health care provider.  Contact your health care provider if you have any questions. Keep all prenatal visits as told by your health care provider. This is important. This information is not intended to replace advice given to you by  your health care provider. Make sure you discuss any questions you have with your health care provider. Document Released: 07/29/2001 Document Revised: 01/10/2016 Document Reviewed: 10/05/2012 Elsevier Interactive Patient Education  2017 [place].   Sciatica Rehab Ask your health care provider which exercises are safe for you. Do exercises exactly as told by your health care provider and adjust them as directed. It is normal to feel mild stretching, pulling, tightness, or discomfort as you do these exercises, but you should stop right away if you feel sudden pain or your pain gets worse.Do not begin these exercises until told by your health care provider. Stretching and range of motion exercises These exercises warm up your muscles and joints and improve the movement and flexibility of your hips and your back. These exercises also help to relieve pain, numbness, and tingling. Exercise A: Sciatic nerve glide 8. Sit in a chair with your head facing down toward your chest. Place your hands behind your back. Let your shoulders slump forward. 9. Slowly straighten one of your knees while you tilt your head back as if you are looking toward the ceiling. Only straighten your leg as far as you can without making your symptoms worse. 10. Hold for __________ seconds. 11. Slowly return to the starting position. 12. Repeat with your other leg. Repeat __________ times. Complete this exercise __________ times a day. Exercise B: Knee to chest with hip adduction and internal rotation 1. Lie on your back on a firm surface with both legs straight. 2. Bend one of your knees and move it up toward your chest until you feel a gentle stretch in your lower back and buttock. Then, move your knee toward the shoulder that is on the opposite side from your leg.  Hold your leg in this position by holding onto the front of your knee. 3. Hold for __________ seconds. 4. Slowly return to the starting position. 5. Repeat  with your other leg. Repeat __________ times. Complete this exercise __________ times a day. Exercise C: Prone extension on elbows 1. Lie on your abdomen on a firm surface. A bed may be too soft for this exercise. 2. Prop yourself up on your elbows. 3. Use your arms to help lift your chest up until you feel a gentle stretch in your abdomen and your lower back.  This will place some of your body weight on your elbows. If this is uncomfortable, try stacking pillows under your chest.  Your hips should stay down, against the surface that you are lying on. Keep your hip and back muscles relaxed. 4.  Hold for __________ seconds. 5. Slowly relax your upper body and return to the starting position. Repeat __________ times. Complete this exercise __________ times a day. Strengthening exercises These exercises build strength and endurance in your back. Endurance is the ability to use your muscles for a long time, even after they get tired. Exercise D: Pelvic tilt 1. Lie on your back on a firm surface. Bend your knees and keep your feet flat. 2. Tense your abdominal muscles. Tip your pelvis up toward the ceiling and flatten your lower back into the floor.  To help with this exercise, you may place a small towel under your lower back and try to push your back into the towel. 3. Hold for __________ seconds. 4. Let your muscles relax completely before you repeat this exercise. Repeat __________ times. Complete this exercise __________ times a day. Exercise E: Alternating arm and leg raises 1. Get on your hands and knees on a firm surface. If you are on a hard floor, you may want to use padding to cushion your knees, such as an exercise mat. 2. Line up your arms and legs. Your hands should be below your shoulders, and your knees should be below your hips. 3. Lift your left leg behind you. At the same time, raise your right arm and straighten it in front of you.  Do not lift your leg higher than your  hip.  Do not lift your arm higher than your shoulder.  Keep your abdominal and back muscles tight.  Keep your hips facing the ground.  Do not arch your back.  Keep your balance carefully, and do not hold your breath. 4. Hold for __________ seconds. 5. Slowly return to the starting position and repeat with your right leg and your left arm. Repeat __________ times. Complete this exercise __________ times a day. Posture and body mechanics   Body mechanics refers to the movements and positions of your body while you do your daily activities. Posture is part of body mechanics. Good posture and healthy body mechanics can help to relieve stress in your body's tissues and joints. Good posture means that your spine is in its natural S-curve position (your spine is neutral), your shoulders are pulled back slightly, and your head is not tipped forward. The following are general guidelines for applying improved posture and body mechanics to your everyday activities. Standing   When standing, keep your spine neutral and your feet about hip-width apart. Keep a slight bend in your knees. Your ears, shoulders, and hips should line up.  When you do a task in which you stand in one place for a long time, place one foot up on a stable object that is 2-4 inches (5-10 cm) high, such as a footstool. This helps keep your spine neutral. Sitting  When sitting, keep your spine neutral and keep your feet flat on the floor. Use a footrest, if necessary, and keep your thighs parallel to the floor. Avoid rounding your shoulders, and avoid tilting your head forward.  When working at a desk or a computer, keep your desk at a height where your hands are slightly lower than your elbows. Slide your chair under your desk so you are close enough to maintain good posture.  When working at a computer, place your monitor at a height where you are looking straight ahead and you do not have to tilt your head forward or downward  to look at the screen. Resting   When lying down and resting,  avoid positions that are most painful for you.  If you have pain with activities such as sitting, bending, stooping, or squatting (flexion-based activities), lie in a position in which your body does not bend very much. For example, avoid curling up on your side with your arms and knees near your chest (fetal position).  If you have pain with activities such as standing for a long time or reaching with your arms (extension-based activities), lie with your spine in a neutral position and bend your knees slightly. Try the following positions:  Lying on your side with a pillow between your knees.  Lying on your back with a pillow under your knees. Lifting   When lifting objects, keep your feet at least shoulder-width apart and tighten your abdominal muscles.  Bend your knees and hips and keep your spine neutral. It is important to lift using the strength of your legs, not your back. Do not lock your knees straight out.  Always ask for help to lift heavy or awkward objects. This information is not intended to replace advice given to you by your health care provider. Make sure you discuss any questions you have with your health care provider. Document Released: 08/04/2005 Document Revised: 04/10/2016 Document Reviewed: 04/20/2015 Elsevier Interactive Patient Education  2017 [employer].

## 2016-08-08 ENCOUNTER — Telehealth: Payer: Self-pay | Admitting: Obstetrics and Gynecology

## 2016-08-08 NOTE — Telephone Encounter (Signed)
Pt states starting yesterday she has had fever, congestion, ST, body aches, vomiting and diarrhea.  She is taking Tylenol, Mucinex and Diclegis.  Advised pt to push fluids, use saline nasal spray, sleep with humidifier and can try Robitussin or Sudafed and Imodium A-D or Kaopectate for the diarrhea.  If symptoms worsen call back.  Informed pt I would sent message to one of our doctors and if there was any other advise for her I will call her back, otherwise follow advise I gave her.  Pt verbalized understanding.

## 2016-08-08 NOTE — Telephone Encounter (Signed)
Pt called stating that she might be having flu like symptoms and would like to see a provider, Please contact pt

## 2016-08-25 ENCOUNTER — Telehealth: Payer: Self-pay | Admitting: Women's Health

## 2016-08-25 NOTE — Telephone Encounter (Signed)
Pt states having sharp pains off and on on rt lower abd since Saturday, pt denies any bleeding or unusual vaginal d/c.  Advised pt to push water, take tylenol as needed and keep appointment for Friday for U/S and OV.  If any new symptoms or pain worsens call us back.  Pt verbalized understanding.

## 2016-08-29 ENCOUNTER — Ambulatory Visit (INDEPENDENT_AMBULATORY_CARE_PROVIDER_SITE_OTHER): Payer: 59 | Admitting: Obstetrics & Gynecology

## 2016-08-29 ENCOUNTER — Encounter: Payer: Self-pay | Admitting: Obstetrics & Gynecology

## 2016-08-29 ENCOUNTER — Ambulatory Visit (INDEPENDENT_AMBULATORY_CARE_PROVIDER_SITE_OTHER): Payer: 59

## 2016-08-29 VITALS — BP 117/77 | HR 96 | Wt 235.0 lb

## 2016-08-29 DIAGNOSIS — H6693 Otitis media, unspecified, bilateral: Secondary | ICD-10-CM

## 2016-08-29 DIAGNOSIS — Z363 Encounter for antenatal screening for malformations: Secondary | ICD-10-CM

## 2016-08-29 DIAGNOSIS — Z3402 Encounter for supervision of normal first pregnancy, second trimester: Secondary | ICD-10-CM

## 2016-08-29 DIAGNOSIS — Z3A21 21 weeks gestation of pregnancy: Secondary | ICD-10-CM

## 2016-08-29 DIAGNOSIS — Z331 Pregnant state, incidental: Secondary | ICD-10-CM

## 2016-08-29 DIAGNOSIS — Z1389 Encounter for screening for other disorder: Secondary | ICD-10-CM

## 2016-08-29 LAB — POCT URINALYSIS DIPSTICK
Blood, UA: NEGATIVE
Glucose, UA: NEGATIVE
Leukocytes, UA: NEGATIVE
Nitrite, UA: NEGATIVE
Protein, UA: NEGATIVE

## 2016-08-29 NOTE — Progress Notes (Signed)
[place] [id number] wks,cephalic,ant pl gr 0,normal ov's bilat,cx 4.6 cm,svp of fluid 6 cm,fhr 151 bpm,efw 421 g,anatomy complete,no obvious abnormalities seen

## 2016-09-18 DIAGNOSIS — H6690 Otitis media, unspecified, unspecified ear: Secondary | ICD-10-CM | POA: Diagnosis not present

## 2016-09-18 DIAGNOSIS — H6090 Unspecified otitis externa, unspecified ear: Secondary | ICD-10-CM | POA: Diagnosis not present

## 2016-09-26 ENCOUNTER — Ambulatory Visit (INDEPENDENT_AMBULATORY_CARE_PROVIDER_SITE_OTHER): Payer: 59 | Admitting: Women's Health

## 2016-09-26 ENCOUNTER — Encounter: Payer: Self-pay | Admitting: Women's Health

## 2016-09-26 VITALS — BP 124/64 | HR 88 | Wt 240.0 lb

## 2016-09-26 DIAGNOSIS — Z3402 Encounter for supervision of normal first pregnancy, second trimester: Secondary | ICD-10-CM

## 2016-09-26 DIAGNOSIS — Z1389 Encounter for screening for other disorder: Secondary | ICD-10-CM

## 2016-09-26 DIAGNOSIS — Z3A25 25 weeks gestation of pregnancy: Secondary | ICD-10-CM

## 2016-09-26 DIAGNOSIS — Z331 Pregnant state, incidental: Secondary | ICD-10-CM

## 2016-09-26 LAB — POCT URINALYSIS DIPSTICK
Blood, UA: NEGATIVE
Glucose, UA: NEGATIVE
Ketones, UA: NEGATIVE
Leukocytes, UA: NEGATIVE
Nitrite, UA: NEGATIVE
Protein, UA: NEGATIVE

## 2016-09-26 NOTE — Progress Notes (Signed)
Low-risk OB appointment G1P0 [redacted]w[redacted]d Estimated Date of Delivery: 01/10/17 BP 124/64   Pulse 88   Wt 240 lb (108.9 kg)   LMP 04/05/2016 (Exact Date)   BMI 34.44 kg/m   BP, weight, and urine reviewed.  Refer to obstetrical flow sheet for FH & FHR.  Reports good fm.  Denies regular uc's, lof, vb, or uti s/s. On antibiotics for bilateral ear infections. Trouble sleeping- gave printed relief measures, bilateral hands numb in AM- discussed CTS, to try wrist splints, cramping last night and some this am, denies abnormal d/c, itching/odor/irritation- wants to be checked Spec exam: cx long and closed, normal d/c, no bleeding, no lof, SVE deferred Reviewed ptl s/s, fm. Increase water to help w/ cramping, avoid caffeine/sodas Plan:  Continue routine obstetrical care  F/U in 4wks for OB appointment and pn2

## 2016-09-26 NOTE — Patient Instructions (Signed)
You will have your sugar test next visit.  Please do not eat or drink anything after midnight the night before you come, not even water.  You will be here for at least two hours.     Call the office [phone]) or go to [facility] if:  You begin to have strong, frequent contractions  Your water breaks.  Sometimes it is a big gush of fluid, sometimes it is just a trickle that keeps getting your panties wet or running down your legs  You have vaginal bleeding.  It is normal to have a small amount of spotting if your cervix was checked.   You don't feel your baby moving like normal.  If you don't, get you something to eat and drink and lay down and focus on feeling your baby move.   If your baby is still not moving like normal, you should call the office or go to [facility] to Help You Sleep Better:   Get into a bedtime routine, try to do the same thing every night before going to bed to try to help your body wind down  Warm baths  Avoid caffeine for at least 3 hours before going to sleep   Keep your room at a slightly cooler temperature, can try running a fan  Turn off TV, lights, phone, electronics  Lots of pillows if needed to help you get comfortable  Lavender scented items can help you sleep. You can place lavender essential oil on a cotton ball and place under your pillowcase, or place in a diffuser. [facility] has a lavender scented sleep line (plug-ins, sprays, etc). Look in the pillow aisle for lavender scented pillows.   If none of the above things help, you can try 1/2 to 1 tablet of benadryl, unisom, or tylenol pm. Do not take this every night, only when you really need it.     Second Trimester of Pregnancy The second trimester is from week 13 through week 28, months 4 through 6. The second trimester is often a time when you feel your best. Your body has also adjusted to being pregnant, and you begin to feel better physically. Usually, morning sickness has  lessened or quit completely, you may have more energy, and you may have an increase in appetite. The second trimester is also a time when the fetus is growing rapidly. At the end of the sixth month, the fetus is about 9 inches long and weighs about 1 pounds. You will likely begin to feel the baby move (quickening) between 18 and 20 weeks of the pregnancy. BODY CHANGES Your body goes through many changes during pregnancy. The changes vary from woman to woman.   Your weight will continue to increase. You will notice your lower abdomen bulging out.  You may begin to get stretch marks on your hips, abdomen, and breasts.  You may develop headaches that can be relieved by medicines approved by your health care provider.  You may urinate more often because the fetus is pressing on your bladder.  You may develop or continue to have heartburn as a result of your pregnancy.  You may develop constipation because certain hormones are causing the muscles that push waste through your intestines to slow down.  You may develop hemorrhoids or swollen, bulging veins (varicose veins).  You may have back pain because of the weight gain and pregnancy hormones relaxing your joints between the bones in your pelvis and as a result of a shift  in weight and the muscles that support your balance.  Your breasts will continue to grow and be tender.  Your gums may bleed and may be sensitive to brushing and flossing.  Dark spots or blotches (chloasma, mask of pregnancy) may develop on your face. This will likely fade after the baby is born.  A dark line from your belly button to the pubic area (linea nigra) may appear. This will likely fade after the baby is born.  You may have changes in your hair. These can include thickening of your hair, rapid growth, and changes in texture. Some women also have hair loss during or after pregnancy, or hair that feels dry or thin. Your hair will most likely return to normal after  your baby is born. WHAT TO EXPECT AT YOUR PRENATAL VISITS During a routine prenatal visit:  You will be weighed to make sure you and the fetus are growing normally.  Your blood pressure will be taken.  Your abdomen will be measured to track your baby's growth.  The fetal heartbeat will be listened to.  Any test results from the previous visit will be discussed. Your health care provider may ask you:  How you are feeling.  If you are feeling the baby move.  If you have had any abnormal symptoms, such as leaking fluid, bleeding, severe headaches, or abdominal cramping.  If you have any questions. Other tests that may be performed during your second trimester include:  Blood tests that check for:  Low iron levels (anemia).  Gestational diabetes (between 24 and 28 weeks).  Rh antibodies.  Urine tests to check for infections, diabetes, or protein in the urine.  An ultrasound to confirm the proper growth and development of the baby.  An amniocentesis to check for possible genetic problems.  Fetal screens for spina bifida and Down syndrome. HOME CARE INSTRUCTIONS   Avoid all smoking, herbs, alcohol, and unprescribed drugs. These chemicals affect the formation and growth of the baby.  Follow your health care provider's instructions regarding medicine use. There are medicines that are either safe or unsafe to take during pregnancy.  Exercise only as directed by your health care provider. Experiencing uterine cramps is a good sign to stop exercising.  Continue to eat regular, healthy meals.  Wear a good support bra for breast tenderness.  Do not use hot tubs, steam rooms, or saunas.  Wear your seat belt at all times when driving.  Avoid raw meat, uncooked cheese, cat litter boxes, and soil used by cats. These carry germs that can cause birth defects in the baby.  Take your prenatal vitamins.  Try taking a stool softener (if your health care provider approves) if you  develop constipation. Eat more high-fiber foods, such as fresh vegetables or fruit and whole grains. Drink plenty of fluids to keep your urine clear or pale yellow.  Take warm sitz baths to soothe any pain or discomfort caused by hemorrhoids. Use hemorrhoid cream if your health care provider approves.  If you develop varicose veins, wear support hose. Elevate your feet for 15 minutes, 3-4 times a day. Limit salt in your diet.  Avoid heavy lifting, wear low heel shoes, and practice good posture.  Rest with your legs elevated if you have leg cramps or low back pain.  Visit your dentist if you have not gone yet during your pregnancy. Use a soft toothbrush to brush your teeth and be gentle when you floss.  A sexual relationship may be continued unless  your health care provider directs you otherwise.  Continue to go to all your prenatal visits as directed by your health care provider. SEEK MEDICAL CARE IF:   You have dizziness.  You have mild pelvic cramps, pelvic pressure, or nagging pain in the abdominal area.  You have persistent nausea, vomiting, or diarrhea.  You have a bad smelling vaginal discharge.  You have pain with urination. SEEK IMMEDIATE MEDICAL CARE IF:   You have a fever.  You are leaking fluid from your vagina.  You have spotting or bleeding from your vagina.  You have severe abdominal cramping or pain.  You have rapid weight gain or loss.  You have shortness of breath with chest pain.  You notice sudden or extreme swelling of your face, hands, ankles, feet, or legs.  You have not felt your baby move in over an hour.  You have severe headaches that do not go away with medicine.  You have vision changes. Document Released: 07/29/2001 Document Revised: 08/09/2013 Document Reviewed: 10/05/2012 [facility] Patient Information 2015 [place], [place]. This information is not intended to replace advice given to you by your health care provider. Make sure you discuss  any questions you have with your health care provider.  [place] Pediatricians/Family Doctors:  [facility] [phone]                 [place] [phone] (usually not accepting new patients unless you have family there already, you are always welcome to call and ask)            [facility] [phone] [place]) [phone]   [facility] Pediatricians/Family Doctors:   [place]: [phone]  [facility]: [phone]   AREA PEDIATRIC/FAMILY [place]. [address] [place], [place] [address] Phone - [phone]   Fax - [place] [address], [place]  [address] Phone - [phone]   Fax - [phone]  [place]. [address] [place] [address] [place], [place]  [address] Phone - [phone]   Fax - [phone]  [facility] [address] [place], [place]  [address] Phone - [phone]   Fax - [phone]  [place] [address] [place], [place]  [address] Phone - [phone]   Fax - [place] [id number] [place], [place]  [address] Phone - [phone]   Fax - [place] [facility] [place] [address], [place], [place]  [address] Phone - [phone]   Fax - [phone]  [place] [address] [place], [place], [place]  [address] Phone - [phone]   Fax - [place] [address] [place], [place]  [address] Phone - [phone]   Fax - [phone] [facility] [place]. [address] [place], [place]  [address] Phone - [phone]   Fax - [phone]  [place] [address] [place],  [place]  [address] Phone - [phone]   Fax - [phone]  [facility] FAMILY MEDICINE AT [place], [place], [place]  [address] Phone - [phone]   Fax - [place] [address], [place], [place]  [address] Phone -  [phone]   Fax - [phone]  [facility] [address] [place], [place]  [address] Phone - [place] [address] [place], [place]  [address] Phone - [phone]   Fax - [place] [address] [place], [place]  [address] Phone - [phone]   Fax - [phone]  [place] [address] [place], [place]  [address] Phone - [phone]   Fax - [place]. [place], [place]  [address] Phone - [phone]   Fax - [place], [place], [place]  [address] Phone - [phone]   Fax - [place] [address], [place], [place]  [address] Phone - [phone]   Fax - [phone]  [place] [address] [place], [place]  [address] Phone - [phone]   Fax - [place], [place], [place]  [address] Phone - [phone]   Fax - [phone]  [place] [address] [place], [place]  [address] Phone - [phone]   Fax - [phone] [place] [id number] [place], [place]  [address] Phone - [phone]   Fax - [place] [address] [place], [place]  [address] Phone - [phone]   Fax - [place] [address] [place], [place]  [address] Phone - [phone]   Fax - [phone]

## 2016-10-09 ENCOUNTER — Telehealth: Payer: Self-pay | Admitting: Women's Health

## 2016-10-09 NOTE — Telephone Encounter (Signed)
Pt states has some vaginal itching so started Monistat 7 last night, this morning she had quite a bit of watery d/c in her underwear.  Informed pt this can be normal, keep an eye on it and if d/c continues or gets worse, or had any bleeding or sever cramping to call us back. Pt verbalized understanding. Pt reports has been having cramping for several weeks and was addressed at last OB visit, baby not as active this morning but she is at work and is very active and usually feels baby more when she is sitting or laying.

## 2016-10-24 ENCOUNTER — Encounter: Payer: Self-pay | Admitting: Obstetrics and Gynecology

## 2016-10-24 ENCOUNTER — Ambulatory Visit (INDEPENDENT_AMBULATORY_CARE_PROVIDER_SITE_OTHER): Payer: 59 | Admitting: Obstetrics and Gynecology

## 2016-10-24 ENCOUNTER — Encounter: Payer: 59 | Admitting: Women's Health

## 2016-10-24 ENCOUNTER — Other Ambulatory Visit: Payer: 59

## 2016-10-24 VITALS — BP 110/60 | HR 76 | Wt 243.0 lb

## 2016-10-24 DIAGNOSIS — Z1389 Encounter for screening for other disorder: Secondary | ICD-10-CM

## 2016-10-24 DIAGNOSIS — Z3402 Encounter for supervision of normal first pregnancy, second trimester: Secondary | ICD-10-CM | POA: Diagnosis not present

## 2016-10-24 DIAGNOSIS — Z3A29 29 weeks gestation of pregnancy: Secondary | ICD-10-CM

## 2016-10-24 DIAGNOSIS — Z3401 Encounter for supervision of normal first pregnancy, first trimester: Secondary | ICD-10-CM

## 2016-10-24 DIAGNOSIS — Z131 Encounter for screening for diabetes mellitus: Secondary | ICD-10-CM

## 2016-10-24 DIAGNOSIS — Z331 Pregnant state, incidental: Secondary | ICD-10-CM

## 2016-10-24 LAB — POCT URINALYSIS DIPSTICK
Blood, UA: NEGATIVE
Glucose, UA: NEGATIVE
Leukocytes, UA: NEGATIVE
Nitrite, UA: NEGATIVE

## 2016-10-24 NOTE — Progress Notes (Signed)
Tammy Chapman is a 26 y.o. female  G1P0  Estimated Date of Delivery: 01/10/17 LROB [redacted]w[redacted]d  Chief Complaint  Patient presents with  . Routine Prenatal Visit    PN2  ____  Patient complains of intermittent pelvic cramping x ~3 weeks. She has also noticed a slight increase in vaginal discharge. Patient also notes insomnia. Patient reports good fetal movement; denies any bleeding, rupture of membranes,or regular contractions.   Blood pressure 110/60, pulse 76, weight 243 lb (110.2 kg), last menstrual period 04/05/2016.   Urine results:notable for small ketones, and trace protein refer to the ob flow sheet for FH and FHR, ,                          Physical Examination: General appearance - alert, well appearing, and in no distress                                      Abdomen - FH 28 cm ,                                                         -FHR 139 bpm                                                         soft, nontender, nondistended, no masses or organomegaly                                      Pelvic exam:  VULVA: normal appearing vulva with no masses, tenderness or lesions,  VAGINA: normal appearing vagina with normal color and discharge, no lesions,  CERVIX: Cervical pH is 4.5. Long, closed and firm.   UTERUS: enlarged to 28 week's size,  ADNEXA: normal adnexa in size, nontender and no masses.                                             Questions were answered. Assessment: LROB G1P0 @ [redacted]w[redacted]d   Plan:  Continued routine obstetrical care  F/u in 3 weeks for routine OB care.  By signing my name below, I, Evelene Croon, attest that this documentation has been prepared under the direction and in the presence of Jonnie Kind, MD . Electronically Signed: Evelene Croon, Scribe. 10/24/2016. 10:16 AM. I personally performed the services described in this documentation, which was SCRIBED in my presence. The recorded information has been reviewed and considered accurate. It has been  edited as necessary during review. Jonnie Kind, MD

## 2016-10-25 LAB — GLUCOSE TOLERANCE, 2 HOURS W/ 1HR
Glucose, 1 hour: 133 mg/dL (ref 65–179)
Glucose, 2 hour: 106 mg/dL (ref 65–152)
Glucose, Fasting: 84 mg/dL (ref 65–91)

## 2016-10-25 LAB — CBC
Hematocrit: 35.9 % (ref 34.0–46.6)
Hemoglobin: 11.8 g/dL (ref 11.1–15.9)
MCH: 28.1 pg (ref 26.6–33.0)
MCHC: 32.9 g/dL (ref 31.5–35.7)
MCV: 86 fL (ref 79–97)
Platelets: 183 10*3/uL (ref 150–379)
RBC: 4.2 x10E6/uL (ref 3.77–5.28)
RDW: 14 % (ref 12.3–15.4)
WBC: 7.9 10*3/uL (ref 3.4–10.8)

## 2016-10-25 LAB — ANTIBODY SCREEN: Antibody Screen: NEGATIVE

## 2016-10-25 LAB — HIV ANTIBODY (ROUTINE TESTING W REFLEX): HIV Screen 4th Generation wRfx: NONREACTIVE

## 2016-10-25 LAB — RPR: RPR Ser Ql: NONREACTIVE

## 2016-11-04 ENCOUNTER — Telehealth: Payer: Self-pay | Admitting: Obstetrics & Gynecology

## 2016-11-04 NOTE — Telephone Encounter (Signed)
Pt called stating that she is 30 weeks preg and has started to Vomit and has Diarrhea. Pt would like to know what she can take for this or what we could prescribed to her. Please contact pt

## 2016-11-04 NOTE — Telephone Encounter (Signed)
Patient called stating she has had vomiting and diarrhea for the past couple of days and a low grade fever Sunday, Monday night (100.2).  She knows she has been exposed to the flu at her work. She has had some contractions as well. I encouraged her to increase fluids, try Imodium. Any other suggestions.

## 2016-11-13 ENCOUNTER — Encounter: Payer: Self-pay | Admitting: Advanced Practice Midwife

## 2016-11-13 ENCOUNTER — Ambulatory Visit (INDEPENDENT_AMBULATORY_CARE_PROVIDER_SITE_OTHER): Payer: 59 | Admitting: Advanced Practice Midwife

## 2016-11-13 VITALS — BP 116/60 | HR 88 | Wt 245.0 lb

## 2016-11-13 DIAGNOSIS — Z1389 Encounter for screening for other disorder: Secondary | ICD-10-CM

## 2016-11-13 DIAGNOSIS — Z3403 Encounter for supervision of normal first pregnancy, third trimester: Secondary | ICD-10-CM

## 2016-11-13 DIAGNOSIS — O368130 Decreased fetal movements, third trimester, not applicable or unspecified: Secondary | ICD-10-CM

## 2016-11-13 DIAGNOSIS — Z331 Pregnant state, incidental: Secondary | ICD-10-CM

## 2016-11-13 LAB — POCT URINALYSIS DIPSTICK
Blood, UA: NEGATIVE
Glucose, UA: NEGATIVE
Ketones, UA: NEGATIVE
Leukocytes, UA: NEGATIVE
Nitrite, UA: NEGATIVE
Protein, UA: NEGATIVE

## 2016-11-13 NOTE — Patient Instructions (Signed)
Third Trimester of Pregnancy The third trimester is from week 28 through week 40 (months 7 through 9). The third trimester is a time when the unborn baby (fetus) is growing rapidly. At the end of the ninth month, the fetus is about 20 inches in length and weighs 6-10 pounds. Body changes during your third trimester Your body will continue to go through many changes during pregnancy. The changes vary from woman to woman. During the third trimester:  Your weight will continue to increase. You can expect to gain 25-35 pounds (11-16 kg) by the end of the pregnancy.  You may begin to get stretch marks on your hips, abdomen, and breasts.  You may urinate more often because the fetus is moving lower into your pelvis and pressing on your bladder.  You may develop or continue to have heartburn. This is caused by increased hormones that slow down muscles in the digestive tract.  You may develop or continue to have constipation because increased hormones slow digestion and cause the muscles that push waste through your intestines to relax.  You may develop hemorrhoids. These are swollen veins (varicose veins) in the rectum that can itch or be painful.  You may develop swollen, bulging veins (varicose veins) in your legs.  You may have increased body aches in the pelvis, back, or thighs. This is due to weight gain and increased hormones that are relaxing your joints.  You may have changes in your hair. These can include thickening of your hair, rapid growth, and changes in texture. Some women also have hair loss during or after pregnancy, or hair that feels dry or thin. Your hair will most likely return to normal after your baby is born.  Your breasts will continue to grow and they will continue to become tender. A yellow fluid (colostrum) may leak from your breasts. This is the first milk you are producing for your baby.  Your belly button may stick out.  You may notice more swelling in your hands,  face, or ankles.  You may have increased tingling or numbness in your hands, arms, and legs. The skin on your belly may also feel numb.  You may feel short of breath because of your expanding uterus.  You may have more problems sleeping. This can be caused by the size of your belly, increased need to urinate, and an increase in your body's metabolism.  You may notice the fetus "dropping," or moving lower in your abdomen (lightening).  You may have increased vaginal discharge.  You may notice your joints feel loose and you may have pain around your pelvic bone.  What to expect at prenatal visits You will have prenatal exams every 2 weeks until week 36. Then you will have weekly prenatal exams. During a routine prenatal visit:  You will be weighed to make sure you and the baby are growing normally.  Your blood pressure will be taken.  Your abdomen will be measured to track your baby's growth.  The fetal heartbeat will be listened to.  Any test results from the previous visit will be discussed.  You may have a cervical check near your due date to see if your cervix has softened or thinned (effaced).  You will be tested for Group B streptococcus. This happens between 35 and 37 weeks.  Your health care provider may ask you:  What your birth plan is.  How you are feeling.  If you are feeling the baby move.  If you have had   any abnormal symptoms, such as leaking fluid, bleeding, severe headaches, or abdominal cramping.  If you are using any tobacco products, including cigarettes, chewing tobacco, and electronic cigarettes.  If you have any questions.  Other tests or screenings that may be performed during your third trimester include:  Blood tests that check for low iron levels (anemia).  Fetal testing to check the health, activity level, and growth of the fetus. Testing is done if you have certain medical conditions or if there are problems during the  pregnancy.  Nonstress test (NST). This test checks the health of your baby to make sure there are no signs of problems, such as the baby not getting enough oxygen. During this test, a belt is placed around your belly. The baby is made to move, and its heart rate is monitored during movement.  What is false labor? False labor is a condition in which you feel small, irregular tightenings of the muscles in the womb (contractions) that usually go away with rest, changing position, or drinking water. These are called Braxton Hicks contractions. Contractions may last for hours, days, or even weeks before true labor sets in. If contractions come at regular intervals, become more frequent, increase in intensity, or become painful, you should see your health care provider. What are the signs of labor?  Abdominal cramps.  Regular contractions that start at 10 minutes apart and become stronger and more frequent with time.  Contractions that start on the top of the uterus and spread down to the lower abdomen and back.  Increased pelvic pressure and dull back pain.  A watery or bloody mucus discharge that comes from the vagina.  Leaking of amniotic fluid. This is also known as your "water breaking." It could be a slow trickle or a gush. Let your health care provider know if it has a color or strange odor. If you have any of these signs, call your health care provider right away, even if it is before your due date. Follow these instructions at home: Medicines  Follow your health care provider's instructions regarding medicine use. Specific medicines may be either safe or unsafe to take during pregnancy.  Take a prenatal vitamin that contains at least 600 micrograms (mcg) of folic acid.  If you develop constipation, try taking a stool softener if your health care provider approves. Eating and drinking  Eat a balanced diet that includes fresh fruits and vegetables, whole grains, good sources of protein  such as meat, eggs, or tofu, and low-fat dairy. Your health care provider will help you determine the amount of weight gain that is right for you.  Avoid raw meat and uncooked cheese. These carry germs that can cause birth defects in the baby.  If you have low calcium intake from food, talk to your health care provider about whether you should take a daily calcium supplement.  Eat four or five small meals rather than three large meals a day.  Limit foods that are high in fat and processed sugars, such as fried and sweet foods.  To prevent constipation: ? Drink enough fluid to keep your urine clear or pale yellow. ? Eat foods that are high in fiber, such as fresh fruits and vegetables, whole grains, and beans. Activity  Exercise only as directed by your health care provider. Most women can continue their usual exercise routine during pregnancy. Try to exercise for 30 minutes at least 5 days a week. Stop exercising if you experience uterine contractions.  Avoid heavy   lifting.  Do not exercise in extreme heat or humidity, or at high altitudes.  Wear low-heel, comfortable shoes.  Practice good posture.  You may continue to have sex unless your health care provider tells you otherwise. Relieving pain and discomfort  Take frequent breaks and rest with your legs elevated if you have leg cramps or low back pain.  Take warm sitz baths to soothe any pain or discomfort caused by hemorrhoids. Use hemorrhoid cream if your health care provider approves.  Wear a good support bra to prevent discomfort from breast tenderness.  If you develop varicose veins: ? Wear support pantyhose or compression stockings as told by your healthcare provider. ? Elevate your feet for 15 minutes, 3-4 times a day. Prenatal care  Write down your questions. Take them to your prenatal visits.  Keep all your prenatal visits as told by your health care provider. This is important. Safety  Wear your seat belt at  all times when driving.  Make a list of emergency phone numbers, including numbers for family, friends, the hospital, and police and fire departments. General instructions  Avoid cat litter boxes and soil used by cats. These carry germs that can cause birth defects in the baby. If you have a cat, ask someone to clean the litter box for you.  Do not travel far distances unless it is absolutely necessary and only with the approval of your health care provider.  Do not use hot tubs, steam rooms, or saunas.  Do not drink alcohol.  Do not use any products that contain nicotine or tobacco, such as cigarettes and e-cigarettes. If you need help quitting, ask your health care provider.  Do not use any medicinal herbs or unprescribed drugs. These chemicals affect the formation and growth of the baby.  Do not douche or use tampons or scented sanitary pads.  Do not cross your legs for long periods of time.  To prepare for the arrival of your baby: ? Take prenatal classes to understand, practice, and ask questions about labor and delivery. ? Make a trial run to the hospital. ? Visit the hospital and tour the maternity area. ? Arrange for maternity or paternity leave through employers. ? Arrange for family and friends to take care of pets while you are in the hospital. ? Purchase a rear-facing car seat and make sure you know how to install it in your car. ? Pack your hospital bag. ? Prepare the baby's nursery. Make sure to remove all pillows and stuffed animals from the baby's crib to prevent suffocation.  Visit your dentist if you have not gone during your pregnancy. Use a soft toothbrush to brush your teeth and be gentle when you floss. Contact a health care provider if:  You are unsure if you are in labor or if your water has broken.  You become dizzy.  You have mild pelvic cramps, pelvic pressure, or nagging pain in your abdominal area.  You have lower back pain.  You have persistent  nausea, vomiting, or diarrhea.  You have an unusual or bad smelling vaginal discharge.  You have pain when you urinate. Get help right away if:  Your water breaks before 37 weeks.  You have regular contractions less than 5 minutes apart before 37 weeks.  You have a fever.  You are leaking fluid from your vagina.  You have spotting or bleeding from your vagina.  You have severe abdominal pain or cramping.  You have rapid weight loss or weight gain.    You have shortness of breath with chest pain.  You notice sudden or extreme swelling of your face, hands, ankles, feet, or legs.  Your baby makes fewer than 10 movements in 2 hours.  You have severe headaches that do not go away when you take medicine.  You have vision changes. Summary  The third trimester is from week 28 through week 40, months 7 through 9. The third trimester is a time when the unborn baby (fetus) is growing rapidly.  During the third trimester, your discomfort may increase as you and your baby continue to gain weight. You may have abdominal, leg, and back pain, sleeping problems, and an increased need to urinate.  During the third trimester your breasts will keep growing and they will continue to become tender. A yellow fluid (colostrum) may leak from your breasts. This is the first milk you are producing for your baby.  False labor is a condition in which you feel small, irregular tightenings of the muscles in the womb (contractions) that eventually go away. These are called Braxton Hicks contractions. Contractions may last for hours, days, or even weeks before true labor sets in.  Signs of labor can include: abdominal cramps; regular contractions that start at 10 minutes apart and become stronger and more frequent with time; watery or bloody mucus discharge that comes from the vagina; increased pelvic pressure and dull back pain; and leaking of amniotic fluid. This information is not intended to replace advice  given to you by your health care provider. Make sure you discuss any questions you have with your health care provider. Document Released: 07/29/2001 Document Revised: 01/10/2016 Document Reviewed: 10/05/2012 Elsevier Interactive Patient Education  2017 [employer]  

## 2016-11-13 NOTE — Progress Notes (Signed)
G1P0 [redacted]w[redacted]d Estimated Date of Delivery: 01/10/17  Blood pressure 116/60, pulse 88, weight 245 lb (111.1 kg), last menstrual period 04/05/2016.   BP weight and urine results all reviewed and noted.  Please refer to the obstetrical flow sheet for the fundal height and fetal heart rate documentation:  Patient reports decreased fetal movement, denies any bleeding and no rupture of membranes symptoms or regular contractions.  Fetus visibly active on EFM today.  Patient c/o dizzy spells at work.  Note given to park closer to building All questions were answered.  Orders Placed This Encounter  Procedures  . POCT urinalysis dipstick    Plan:  Continued routine obstetrical care, NST reactive  Return in about 2 weeks (around 11/27/2016) for LROB.

## 2016-11-28 ENCOUNTER — Ambulatory Visit (INDEPENDENT_AMBULATORY_CARE_PROVIDER_SITE_OTHER): Payer: 59 | Admitting: Obstetrics and Gynecology

## 2016-11-28 ENCOUNTER — Encounter: Payer: Self-pay | Admitting: Obstetrics and Gynecology

## 2016-11-28 VITALS — BP 132/70 | HR 104 | Wt 244.6 lb

## 2016-11-28 DIAGNOSIS — Z331 Pregnant state, incidental: Secondary | ICD-10-CM

## 2016-11-28 DIAGNOSIS — O368130 Decreased fetal movements, third trimester, not applicable or unspecified: Secondary | ICD-10-CM

## 2016-11-28 DIAGNOSIS — Z3403 Encounter for supervision of normal first pregnancy, third trimester: Secondary | ICD-10-CM

## 2016-11-28 DIAGNOSIS — Z1389 Encounter for screening for other disorder: Secondary | ICD-10-CM

## 2016-11-28 LAB — POCT URINALYSIS DIPSTICK
Blood, UA: NEGATIVE
Glucose, UA: NEGATIVE
Ketones, UA: NEGATIVE
Leukocytes, UA: NEGATIVE
Nitrite, UA: NEGATIVE
Protein, UA: NEGATIVE

## 2016-11-28 NOTE — Patient Instructions (Signed)
Please watch out for decreased fetal movement: If you feel that the baby is not moving as well as usual. You should count kicks. Go to a quiet room and count each kick. Anything less than 10 kicks an hour for 2 or more hours is decreased/low. If this happens try drinking cool water or eat and attempt kick counting again for an additional 2 hours. If that does not help, [facility].

## 2016-11-28 NOTE — Progress Notes (Signed)
Patient ID: Tammy Chapman, female   DOB: 1990-10-20, 26 y.o.   MRN: [id number]  G1P0  Estimated Date of Delivery: 01/10/17 [facility] [redacted]w[redacted]d  Chief Complaint  Patient presents with  . Routine Prenatal Visit  ____  Patient complaints:decreased fetal movement.  Patient reports decreased fetal movement. She denies any bleeding, rupture of membranes,or regular contractions.  Blood pressure 132/70, pulse (!) 104, weight 244 lb 9.6 oz (110.9 kg), last menstrual period 04/05/2016.   Urine results:notable for none refer to the ob flow sheet for FH and FHR, ,                          Physical Examination: General appearance - alert, well appearing, and in no distress                                      Abdomen - FH 33 cm                                                        -FHR 150 bpm with accels to 170                                                         soft, nontender, nondistended, no masses or organomegaly                                            Questions were answered. Assessment: LROB G1P0 @ [redacted]w[redacted]d  NST reactive  Plan:  Continued routine obstetrical care  F/u in 2 weeks for routine prenatal care   By signing my name below, I, Hansel Feinstein, attest that this documentation has been prepared under the direction and in the presence of Jonnie Kind, MD. Electronically Signed: Hansel Feinstein, ED Scribe. 11/28/16. 12:42 PM.  I personally performed the services described in this documentation, which was SCRIBED in my presence. The recorded information has been reviewed and considered accurate. It has been edited as necessary during review. Jonnie Kind, MD

## 2016-12-12 ENCOUNTER — Encounter: Payer: Self-pay | Admitting: Women's Health

## 2016-12-12 ENCOUNTER — Ambulatory Visit (INDEPENDENT_AMBULATORY_CARE_PROVIDER_SITE_OTHER): Payer: 59 | Admitting: Women's Health

## 2016-12-12 VITALS — BP 126/72 | HR 97 | Wt 247.0 lb

## 2016-12-12 DIAGNOSIS — Z331 Pregnant state, incidental: Secondary | ICD-10-CM

## 2016-12-12 DIAGNOSIS — Z3403 Encounter for supervision of normal first pregnancy, third trimester: Secondary | ICD-10-CM

## 2016-12-12 DIAGNOSIS — R300 Dysuria: Secondary | ICD-10-CM | POA: Diagnosis not present

## 2016-12-12 DIAGNOSIS — Z1389 Encounter for screening for other disorder: Secondary | ICD-10-CM

## 2016-12-12 LAB — POCT URINALYSIS DIPSTICK
Blood, UA: NEGATIVE
Glucose, UA: NEGATIVE
Ketones, UA: NEGATIVE
Leukocytes, UA: NEGATIVE
Nitrite, UA: NEGATIVE
Protein, UA: NEGATIVE

## 2016-12-12 MED ORDER — NITROFURANTOIN MONOHYD MACRO 100 MG PO CAPS: 100.0000 mg | ORAL_CAPSULE | Freq: Two times a day (BID) | ORAL | 0 refills | Status: DC

## 2016-12-12 NOTE — Progress Notes (Signed)
Low-risk OB appointment G1P0 [redacted]w[redacted]d Estimated Date of Delivery: 01/10/17 BP 126/72   Pulse 97   Wt 247 lb (112 kg)   LMP 04/05/2016 (Exact Date)   BMI 35.44 kg/m   BP, weight, and urine reviewed.  Refer to obstetrical flow sheet for FH & FHR.  Reports good fm.  Denies regular uc's, lof, vb, or uti s/s. Foul odor to urine, dysuria beginning yesterday. No increased frequency/urgency. Doesn't feel she empties bladder completely. Has had uti in past, feels like this is one starting. Rx macrobid, send urine for cx.  Reviewed ptl s/s, fkc. Plan:  Continue routine obstetrical care  F/U in 1wk for OB appointment and gbs

## 2016-12-12 NOTE — Patient Instructions (Signed)
Call the office [phone]) or go to [facility] if:  You begin to have strong, frequent contractions  Your water breaks.  Sometimes it is a big gush of fluid, sometimes it is just a trickle that keeps getting your panties wet or running down your legs  You have vaginal bleeding.  It is normal to have a small amount of spotting if your cervix was checked.   You don't feel your baby moving like normal.  If you don't, get you something to eat and drink and lay down and focus on feeling your baby move.  You should feel at least 10 movements in 2 hours.  If you don't, you should call the office or go to [facility]/Family Doctors:  [place] [phone]                 [place] [phone] (usually not accepting new patients unless you have family there already, you are always welcome to call and ask)            [facility] Pediatricians/Family Doctors:   [place]: [phone]  [facility]: [phone]       Preterm Labor and Birth Information The normal length of a pregnancy is 39-41 weeks. Preterm labor is when labor starts before 37 completed weeks of pregnancy. What are the risk factors for preterm labor? Preterm labor is more likely to occur in women who:  Have certain infections during pregnancy such as a bladder infection, sexually transmitted infection, or infection inside the uterus (chorioamnionitis).  Have a shorter-than-normal cervix.  Have gone into preterm labor before.  Have had surgery on their cervix.  Are younger than age 25 or older than age 57.  Are African American.  Are pregnant with twins or multiple babies (multiple gestation).  Take street drugs or smoke while pregnant.  Do not gain enough weight while pregnant.  Became pregnant shortly after having been pregnant. What are the symptoms of preterm  labor? Symptoms of preterm labor include:  Cramps similar to those that can happen during a menstrual period. The cramps may happen with diarrhea.  Pain in the abdomen or lower back.  Regular uterine contractions that may feel like tightening of the abdomen.  A feeling of increased pressure in the pelvis.  Increased watery or bloody mucus discharge from the vagina.  Water breaking (ruptured amniotic sac). Why is it important to recognize signs of preterm labor? It is important to recognize signs of preterm labor because babies who are born prematurely may not be fully developed. This can put them at an increased risk for:  Long-term (chronic) heart and lung problems.  Difficulty immediately after birth with regulating body systems, including blood sugar, body temperature, heart rate, and breathing rate.  Bleeding in the brain.  Cerebral palsy.  Learning difficulties.  Death. These risks are highest for babies who are born before 75 weeks of pregnancy. How is preterm labor treated? Treatment depends on the length of your pregnancy, your condition, and the health of your baby. It may involve:  Having a stitch (suture) placed in your cervix to prevent your cervix from opening too early (cerclage).  Taking or being given medicines, such as:  Hormone medicines. These may be given early in pregnancy to help support the pregnancy.  Medicine to stop contractions.  Medicines to help mature the baby's lungs. These may be prescribed  if the risk of delivery is high.  Medicines to prevent your baby from developing cerebral palsy. If the labor happens before 34 weeks of pregnancy, you may need to stay in the hospital. What should I do if I think I am in preterm labor? If you think that you are going into preterm labor, call your health care provider right away. How can I prevent preterm labor in future pregnancies? To increase your chance of having a full-term pregnancy:  Do not  use any tobacco products, such as cigarettes, chewing tobacco, and e-cigarettes. If you need help quitting, ask your health care provider.  Do not use street drugs or medicines that have not been prescribed to you during your pregnancy.  Talk with your health care provider before taking any herbal supplements, even if you have been taking them regularly.  Make sure you gain a healthy amount of weight during your pregnancy.  Watch for infection. If you think that you might have an infection, get it checked right away.  Make sure to tell your health care provider if you have gone into preterm labor before. This information is not intended to replace advice given to you by your health care provider. Make sure you discuss any questions you have with your health care provider. Document Released: 10/25/2003 Document Revised: 01/15/2016 Document Reviewed: 12/26/2015 Elsevier Interactive Patient Education  2017 [employer].

## 2016-12-14 LAB — URINE CULTURE

## 2016-12-18 ENCOUNTER — Encounter: Payer: 59 | Admitting: Women's Health

## 2016-12-19 ENCOUNTER — Encounter: Payer: Self-pay | Admitting: Women's Health

## 2016-12-19 ENCOUNTER — Ambulatory Visit (INDEPENDENT_AMBULATORY_CARE_PROVIDER_SITE_OTHER): Payer: 59 | Admitting: Women's Health

## 2016-12-19 VITALS — BP 120/70 | HR 81 | Wt 254.0 lb

## 2016-12-19 DIAGNOSIS — Z1389 Encounter for screening for other disorder: Secondary | ICD-10-CM

## 2016-12-19 DIAGNOSIS — Z3403 Encounter for supervision of normal first pregnancy, third trimester: Secondary | ICD-10-CM | POA: Diagnosis not present

## 2016-12-19 DIAGNOSIS — Z3A36 36 weeks gestation of pregnancy: Secondary | ICD-10-CM | POA: Diagnosis not present

## 2016-12-19 DIAGNOSIS — Z331 Pregnant state, incidental: Secondary | ICD-10-CM

## 2016-12-19 LAB — POCT URINALYSIS DIPSTICK
Blood, UA: NEGATIVE
Glucose, UA: NEGATIVE
Ketones, UA: NEGATIVE
Leukocytes, UA: NEGATIVE
Nitrite, UA: NEGATIVE
Protein, UA: NEGATIVE

## 2016-12-19 NOTE — Progress Notes (Signed)
Low-risk OB appointment G1P0 [redacted]w[redacted]d Estimated Date of Delivery: 01/10/17 BP 120/70   Pulse 81   Wt 254 lb (115.2 kg)   LMP 04/05/2016 (Exact Date)   BMI 36.45 kg/m   BP, weight, and urine reviewed.  Refer to obstetrical flow sheet for FH & FHR.  Reports good fm.  Denies regular uc's, lof, vb, or uti s/s. Legs swelling at work despite wearing compression stockings. Works at [employer] and pt reports they cut the air off for multiple hours at a time. Wants to start maternity leave. Understands can potentially be pregnant another 4 weeks and this can take away from her time w/ baby after- understands and wants to begin leave immediately. Note given.  GBS, gc/ct collected SVE cl/th/vtx Reviewed labor s/s, fkc. Plan:  Continue routine obstetrical care  F/U in 1wk for OB appointment

## 2016-12-19 NOTE — Patient Instructions (Signed)
Call the office ([phone]) or go to [facility] if:  You begin to have strong, frequent contractions  Your water breaks.  Sometimes it is a big gush of fluid, sometimes it is just a trickle that keeps getting your panties wet or running down your legs  You have vaginal bleeding.  It is normal to have a small amount of spotting if your cervix was checked.   You don't feel your baby moving like normal.  If you don't, get you something to eat and drink and lay down and focus on feeling your baby move.  You should feel at least 10 movements in 2 hours.  If you don't, you should call the office or go to [facility].     Braxton Hicks Contractions Contractions of the uterus can occur throughout pregnancy, but they are not always a sign that you are in labor. You may have practice contractions called Braxton Hicks contractions. These false labor contractions are sometimes confused with true labor. What are Braxton Hicks contractions? Braxton Hicks contractions are tightening movements that occur in the muscles of the uterus before labor. Unlike true labor contractions, these contractions do not result in opening (dilation) and thinning of the cervix. Toward the end of pregnancy (32-34 weeks), Braxton Hicks contractions can happen more often and may become stronger. These contractions are sometimes difficult to tell apart from true labor because they can be very uncomfortable. You should not feel embarrassed if you go to the hospital with false labor. Sometimes, the only way to tell if you are in true labor is for your health care provider to look for changes in the cervix. The health care provider will do a physical exam and may monitor your contractions. If you are not in true labor, the exam should show that your cervix is not dilating and your water has not broken. If there are no prenatal problems or other health problems associated with your pregnancy, it is completely safe for you to be sent  home with false labor. You may continue to have Braxton Hicks contractions until you go into true labor. How can I tell the difference between true labor and false labor?  Differences ? False labor ? Contractions last 30-70 seconds.: Contractions are usually shorter and not as strong as true labor contractions. ? Contractions become very regular.: Contractions are usually irregular. ? Discomfort is usually felt in the top of the uterus, and it spreads to the lower abdomen and low back.: Contractions are often felt in the front of the lower abdomen and in the groin. ? Contractions do not go away with walking.: Contractions may go away when you walk around or change positions while lying down. ? Contractions usually become more intense and increase in frequency.: Contractions get weaker and are shorter-lasting as time goes on. ? The cervix dilates and gets thinner.: The cervix usually does not dilate or become thin. Follow these instructions at home:  Take over-the-counter and prescription medicines only as told by your health care provider.  Keep up with your usual exercises and follow other instructions from your health care provider.  Eat and drink lightly if you think you are going into labor.  If Braxton Hicks contractions are making you uncomfortable: ? Change your position from lying down or resting to walking, or change from walking to resting. ? Sit and rest in a tub of warm water. ? Drink enough fluid to keep your urine clear or pale yellow. Dehydration may cause these contractions. ?   Do slow and deep breathing several times an hour.  Keep all follow-up prenatal visits as told by your health care provider. This is important. Contact a health care provider if:  You have a fever.  You have continuous pain in your abdomen. Get help right away if:  Your contractions become stronger, more regular, and closer together.  You have fluid leaking or gushing from your vagina.  You  pass blood-tinged mucus (bloody show).  You have bleeding from your vagina.  You have low back pain that you never had before.  You feel your baby's head pushing down and causing pelvic pressure.  Your baby is not moving inside you as much as it used to. Summary  Contractions that occur before labor are called Braxton Hicks contractions, false labor, or practice contractions.  Braxton Hicks contractions are usually shorter, weaker, farther apart, and less regular than true labor contractions. True labor contractions usually become progressively stronger and regular and they become more frequent.  Manage discomfort from Braxton Hicks contractions by changing position, resting in a warm bath, drinking plenty of water, or practicing deep breathing. This information is not intended to replace advice given to you by your health care provider. Make sure you discuss any questions you have with your health care provider. Document Released: 08/04/2005 Document Revised: 06/23/2016 Document Reviewed: 06/23/2016 Elsevier Interactive Patient Education  2017 [employer]  

## 2016-12-21 LAB — STREP GP B NAA+RFLX: Strep Gp B NAA+Rflx: NEGATIVE

## 2016-12-21 LAB — OB RESULTS CONSOLE GBS: GBS: NEGATIVE

## 2016-12-22 DIAGNOSIS — Z029 Encounter for administrative examinations, unspecified: Secondary | ICD-10-CM

## 2016-12-22 LAB — GC/CHLAMYDIA PROBE AMP
Chlamydia trachomatis, NAA: NEGATIVE
Neisseria gonorrhoeae by PCR: NEGATIVE

## 2016-12-26 ENCOUNTER — Ambulatory Visit (INDEPENDENT_AMBULATORY_CARE_PROVIDER_SITE_OTHER): Payer: 59 | Admitting: Women's Health

## 2016-12-26 ENCOUNTER — Encounter: Payer: Self-pay | Admitting: Women's Health

## 2016-12-26 VITALS — BP 132/80 | HR 89 | Wt 252.0 lb

## 2016-12-26 DIAGNOSIS — Z331 Pregnant state, incidental: Secondary | ICD-10-CM

## 2016-12-26 DIAGNOSIS — Z3403 Encounter for supervision of normal first pregnancy, third trimester: Secondary | ICD-10-CM

## 2016-12-26 DIAGNOSIS — Z1389 Encounter for screening for other disorder: Secondary | ICD-10-CM

## 2016-12-26 LAB — POCT URINALYSIS DIPSTICK
Blood, UA: NEGATIVE
Glucose, UA: NEGATIVE
Ketones, UA: NEGATIVE
Leukocytes, UA: NEGATIVE
Nitrite, UA: NEGATIVE
Protein, UA: NEGATIVE

## 2016-12-26 NOTE — Progress Notes (Signed)
Low-risk OB appointment G1P0 [redacted]w[redacted]d Estimated Date of Delivery: 01/10/17 BP 132/80   Pulse 89   Wt 252 lb (114.3 kg)   LMP 04/05/2016 (Exact Date)   BMI 36.16 kg/m   BP, weight, and urine reviewed.  Refer to obstetrical flow sheet for FH & FHR.  Reports good fm.  Denies regular uc's, lof, vb, or uti s/s. No complaints. Swelling has improved some since out of work. Some headaches, apap doesn't help-will give printed list of prevention/relief measures. BP normal and at her baseline. No proteinuria. 2+ BLE edema, DTRs 2+, no clonus SVE per request: cl/th/-2, vtx Reviewed labor s/s, fkc, pre-e s/s, gbs neg. Plan:  Continue routine obstetrical care  F/U in 1wk for OB appointment

## 2016-12-26 NOTE — Patient Instructions (Addendum)
Call the office [phone]) or go to [facility] if:  You begin to have strong, frequent contractions  Your water breaks.  Sometimes it is a big gush of fluid, sometimes it is just a trickle that keeps getting your panties wet or running down your legs  You have vaginal bleeding.  It is normal to have a small amount of spotting if your cervix was checked.   You don't feel your baby moving like normal.  If you don't, get you something to eat and drink and lay down and focus on feeling your baby move.  You should feel at least 10 movements in 2 hours.  If you don't, you should call the office or go to [facility].   For Headaches:   Stay well hydrated, drink enough water so that your urine is clear, sometimes if you are dehydrated you can get headaches  Eat small frequent meals and snacks, sometimes if you are hungry you can get headaches  Sometimes you get headaches during pregnancy from the pregnancy hormones  You can try tylenol (1-2 regular strength 325mg  or 1-2 extra strength 500mg ) as directed on the box. The least amount of medication that works is best.   Cool compresses (cool wet washcloth or ice pack) to area of head that is hurting  You can also try drinking a caffeinated drink to see if this will help  If not helping, try below:  For Prevention of Headaches/Migraines:  CoQ10 100mg  three times daily  Vitamin B2 400mg  daily  Magnesium Oxide 400-600mg  daily  If You Get a Bad Headache/Migraine:  Benadryl 25mg    Magnesium Oxide  1 large Gatorade  2 extra strength Tylenol (1,000mg  total)  1 cup coffee or Coke  If this doesn't help please call us @ [phone]   Call the office [phone]) or go to [facility] hospital for these signs of pre-eclampsia:  Severe headache that does not go away with Tylenol  Visual changes- seeing spots, double, blurred vision  Pain under your right breast or upper abdomen that does not go away with Tums or heartburn  medicine  Nausea and/or vomiting  Severe swelling in your hands, feet, and face         Braxton Hicks Contractions Contractions of the uterus can occur throughout pregnancy, but they are not always a sign that you are in labor. You may have practice contractions called Braxton Hicks contractions. These false labor contractions are sometimes confused with true labor. What are [place] contractions? Braxton Hicks contractions are tightening movements that occur in the muscles of the uterus before labor. Unlike true labor contractions, these contractions do not result in opening (dilation) and thinning of the cervix. Toward the end of pregnancy (32-34 weeks), Braxton Hicks contractions can happen more often and may become stronger. These contractions are sometimes difficult to tell apart from true labor because they can be very uncomfortable. You should not feel embarrassed if you go to the hospital with false labor. Sometimes, the only way to tell if you are in true labor is for your health care provider to look for changes in the cervix. The health care provider will do a physical exam and may monitor your contractions. If you are not in true labor, the exam should show that your cervix is not dilating and your water has not broken. If there are no prenatal problems or other health problems associated with your pregnancy, it is completely safe for you to be sent home with false labor.  You may continue to have Braxton Hicks contractions until you go into true labor. How can I tell the difference between true labor and false labor?  Differences  False labor  Contractions last 30-70 seconds.: Contractions are usually shorter and not as strong as true labor contractions.  Contractions become very regular.: Contractions are usually irregular.  Discomfort is usually felt in the top of the uterus, and it spreads to the lower abdomen and low back.: Contractions are often felt in the front of  the lower abdomen and in the groin.  Contractions do not go away with walking.: Contractions may go away when you walk around or change positions while lying down.  Contractions usually become more intense and increase in frequency.: Contractions get weaker and are shorter-lasting as time goes on.  The cervix dilates and gets thinner.: The cervix usually does not dilate or become thin. Follow these instructions at home:  Take over-the-counter and prescription medicines only as told by your health care provider.  Keep up with your usual exercises and follow other instructions from your health care provider.  Eat and drink lightly if you think you are going into labor.  If Braxton Hicks contractions are making you uncomfortable:  Change your position from lying down or resting to walking, or change from walking to resting.  Sit and rest in a tub of warm water.  Drink enough fluid to keep your urine clear or pale yellow. Dehydration may cause these contractions.  Do slow and deep breathing several times an hour.  Keep all follow-up prenatal visits as told by your health care provider. This is important. Contact a health care provider if:  You have a fever.  You have continuous pain in your abdomen. Get help right away if:  Your contractions become stronger, more regular, and closer together.  You have fluid leaking or gushing from your vagina.  You pass blood-tinged mucus (bloody show).  You have bleeding from your vagina.  You have low back pain that you never had before.  You feel your baby's head pushing down and causing pelvic pressure.  Your baby is not moving inside you as much as it used to. Summary  Contractions that occur before labor are called Braxton Hicks contractions, false labor, or practice contractions.  Braxton Hicks contractions are usually shorter, weaker, farther apart, and less regular than true labor contractions. True labor contractions usually  become progressively stronger and regular and they become more frequent.  Manage discomfort from [facility] contractions by changing position, resting in a warm bath, drinking plenty of water, or practicing deep breathing. This information is not intended to replace advice given to you by your health care provider. Make sure you discuss any questions you have with your health care provider. Document Released: 08/04/2005 Document Revised: 06/23/2016 Document Reviewed: 06/23/2016 Elsevier Interactive Patient Education  2017 [employer].

## 2017-01-02 ENCOUNTER — Encounter: Payer: Self-pay | Admitting: Obstetrics and Gynecology

## 2017-01-02 ENCOUNTER — Ambulatory Visit (INDEPENDENT_AMBULATORY_CARE_PROVIDER_SITE_OTHER): Payer: 59 | Admitting: Obstetrics and Gynecology

## 2017-01-02 VITALS — BP 122/82 | HR 105 | Wt 251.0 lb

## 2017-01-02 DIAGNOSIS — Z331 Pregnant state, incidental: Secondary | ICD-10-CM

## 2017-01-02 DIAGNOSIS — Z3403 Encounter for supervision of normal first pregnancy, third trimester: Secondary | ICD-10-CM

## 2017-01-02 DIAGNOSIS — O368190 Decreased fetal movements, unspecified trimester, not applicable or unspecified: Secondary | ICD-10-CM | POA: Diagnosis not present

## 2017-01-02 DIAGNOSIS — Z1389 Encounter for screening for other disorder: Secondary | ICD-10-CM

## 2017-01-02 LAB — POCT URINALYSIS DIPSTICK
Blood, UA: NEGATIVE
Glucose, UA: NEGATIVE
Ketones, UA: NEGATIVE
Leukocytes, UA: NEGATIVE
Nitrite, UA: NEGATIVE
Protein, UA: NEGATIVE

## 2017-01-02 MED ORDER — ZOLPIDEM TARTRATE 10 MG PO TABS: 5.0000 mg | ORAL_TABLET | Freq: Every evening | ORAL | 3 refills | Status: DC | PRN

## 2017-01-02 MED ORDER — ZOLPIDEM TARTRATE 10 MG PO TABS: 5.0000 mg | ORAL_TABLET | Freq: Every evening | ORAL | 0 refills | Status: DC | PRN

## 2017-01-02 NOTE — Progress Notes (Addendum)
Tammy Chapman is a 26 y.o. female  G1P0  Estimated Date of Delivery: 01/10/17 LROB [redacted]w[redacted]d  Chief Complaint  Patient presents with  . Routine Prenatal Visit  ____  Patient complaints:lower back pain and lower abdominal pain for ~ 3 days. Patient reports decreased fetal movements x 3 days as well. She also notes continued decreased sensation in her hands secondary to her carpal tunnel She denies any bleeding, rupture of membranes,or regular contractions (notes sporadic contractions).   Blood pressure 122/82, pulse (!) 105, weight 251 lb (113.9 kg), last menstrual period 04/05/2016.   Urine results:NEGATIVE refer to the ob flow sheet for FH and FHR,                        Physical Examination: General appearance - alert, well appearing, and in no distress                                      Abdomen - FH 36 cm,                                                         -FHR 143 bpm                                                         soft, nontender, nondistended, no masses or organomegaly                                      Pelvic - cervix is long, closed and posterior.                                             Questions were answered.  Assessment:  1. [place] @ [redacted]w[redacted]d 2. Severe carpal tunnel bilateral, with wrist splints. 3. Decreased fetal movement, Reactive NST today. Begin biweekly testing, consider IOL if persists.  Plan:  NST in office today to address concerns for decreased fetal movement. Will begin NST monitoring twice weekly.  Rx AMBIEN 5 MG HS PRN F/u in 4 days for NST and  for continued routine obstetrical care NST . CONSIDER iol IF CERVIX FAVORABLE.  By signing my name below, I, Evelene Croon, attest that this documentation has been prepared under the direction and in the presence of Jonnie Kind, MD . Electronically Signed: Evelene Croon, Scribe. 01/02/2017. 11:13 AM. I personally performed the services described in this documentation, which was SCRIBED in my  presence. The recorded information has been reviewed and considered accurate. It has been edited as necessary during review. Jonnie Kind, MD

## 2017-01-06 ENCOUNTER — Ambulatory Visit (INDEPENDENT_AMBULATORY_CARE_PROVIDER_SITE_OTHER): Payer: 59 | Admitting: Obstetrics and Gynecology

## 2017-01-06 ENCOUNTER — Encounter: Payer: Self-pay | Admitting: Obstetrics and Gynecology

## 2017-01-06 ENCOUNTER — Inpatient Hospital Stay (HOSPITAL_COMMUNITY)
Admission: AD | Admit: 2017-01-06 | Discharge: 2017-01-10 | DRG: 766 | Disposition: A | Discharge status: Home / Self Care | Payer: 59 | Source: Ambulatory Visit | Attending: Obstetrics and Gynecology | Admitting: Obstetrics and Gynecology

## 2017-01-06 VITALS — BP 140/100 | HR 84 | Wt 251.0 lb

## 2017-01-06 DIAGNOSIS — Z880 Allergy status to penicillin: Secondary | ICD-10-CM | POA: Diagnosis not present

## 2017-01-06 DIAGNOSIS — Z3403 Encounter for supervision of normal first pregnancy, third trimester: Secondary | ICD-10-CM

## 2017-01-06 DIAGNOSIS — Z1389 Encounter for screening for other disorder: Secondary | ICD-10-CM

## 2017-01-06 DIAGNOSIS — O134 Gestational [pregnancy-induced] hypertension without significant proteinuria, complicating childbirth: Secondary | ICD-10-CM | POA: Diagnosis present

## 2017-01-06 DIAGNOSIS — Z6835 Body mass index (BMI) 35.0-35.9, adult: Secondary | ICD-10-CM

## 2017-01-06 DIAGNOSIS — O99214 Obesity complicating childbirth: Secondary | ICD-10-CM | POA: Diagnosis present

## 2017-01-06 DIAGNOSIS — Z91040 Latex allergy status: Secondary | ICD-10-CM

## 2017-01-06 DIAGNOSIS — H6693 Otitis media, unspecified, bilateral: Secondary | ICD-10-CM

## 2017-01-06 DIAGNOSIS — Z3A00 Weeks of gestation of pregnancy not specified: Secondary | ICD-10-CM | POA: Diagnosis not present

## 2017-01-06 DIAGNOSIS — O133 Gestational [pregnancy-induced] hypertension without significant proteinuria, third trimester: Secondary | ICD-10-CM | POA: Diagnosis not present

## 2017-01-06 DIAGNOSIS — Z331 Pregnant state, incidental: Secondary | ICD-10-CM

## 2017-01-06 DIAGNOSIS — Z3A39 39 weeks gestation of pregnancy: Secondary | ICD-10-CM

## 2017-01-06 DIAGNOSIS — Z98891 History of uterine scar from previous surgery: Secondary | ICD-10-CM

## 2017-01-06 DIAGNOSIS — R030 Elevated blood-pressure reading, without diagnosis of hypertension: Secondary | ICD-10-CM | POA: Diagnosis present

## 2017-01-06 DIAGNOSIS — Z885 Allergy status to narcotic agent status: Secondary | ICD-10-CM

## 2017-01-06 LAB — CBC
HCT: 35.5 % — ABNORMAL LOW (ref 36.0–46.0)
Hemoglobin: 11.5 g/dL — ABNORMAL LOW (ref 12.0–15.0)
MCH: 25.6 pg — ABNORMAL LOW (ref 26.0–34.0)
MCHC: 32.4 g/dL (ref 30.0–36.0)
MCV: 79.1 fL (ref 78.0–100.0)
Platelets: 189 10*3/uL (ref 150–400)
RBC: 4.49 MIL/uL (ref 3.87–5.11)
RDW: 13.7 % (ref 11.5–15.5)
WBC: 9.8 10*3/uL (ref 4.0–10.5)

## 2017-01-06 LAB — COMPREHENSIVE METABOLIC PANEL
ALT: 10 U/L — ABNORMAL LOW (ref 14–54)
AST: 15 U/L (ref 15–41)
Albumin: 3.2 g/dL — ABNORMAL LOW (ref 3.5–5.0)
Alkaline Phosphatase: 184 U/L — ABNORMAL HIGH (ref 38–126)
Anion gap: 8 (ref 5–15)
BUN: <5 mg/dL — ABNORMAL LOW (ref 6–20)
CO2: 19 mmol/L — ABNORMAL LOW (ref 22–32)
Calcium: 8.8 mg/dL — ABNORMAL LOW (ref 8.9–10.3)
Chloride: 103 mmol/L (ref 101–111)
Creatinine, Ser: 0.68 mg/dL (ref 0.44–1.00)
GFR calc Af Amer: >60 mL/min (ref >60)
GFR calc non Af Amer: >60 mL/min (ref >60)
Glucose, Bld: 120 mg/dL — ABNORMAL HIGH (ref 65–99)
Potassium: 3.8 mmol/L (ref 3.5–5.1)
Sodium: 130 mmol/L — ABNORMAL LOW (ref 135–145)
Total Bilirubin: 0.6 mg/dL (ref 0.3–1.2)
Total Protein: 6.6 g/dL (ref 6.5–8.1)

## 2017-01-06 LAB — TYPE AND SCREEN
ABO/RH(D): A POS
Antibody Screen: NEGATIVE

## 2017-01-06 LAB — POCT URINALYSIS DIPSTICK
Blood, UA: NEGATIVE
Glucose, UA: NEGATIVE
Ketones, UA: NEGATIVE
Leukocytes, UA: NEGATIVE
Nitrite, UA: NEGATIVE
Protein, UA: NEGATIVE

## 2017-01-06 MED ORDER — MISOPROSTOL 25 MCG QUARTER TABLET
25.0000 ug | ORAL_TABLET | ORAL | Status: DC | PRN
  Administered 2017-01-06: 25 ug via VAGINAL
  Filled 2017-01-06 (×3): qty 1

## 2017-01-06 MED ORDER — OXYTOCIN BOLUS FROM INFUSION: 500.0000 mL | Freq: Once | INTRAVENOUS | Status: DC

## 2017-01-06 MED ORDER — TERBUTALINE SULFATE 1 MG/ML IJ SOLN: 0.2500 mg | Freq: Once | INTRAMUSCULAR | Status: DC | PRN

## 2017-01-06 MED ORDER — SOD CITRATE-CITRIC ACID 500-334 MG/5ML PO SOLN
30.0000 mL | ORAL | Status: DC | PRN
  Administered 2017-01-08: 30 mL via ORAL
  Filled 2017-01-06: qty 15

## 2017-01-06 MED ORDER — LIDOCAINE HCL (PF) 1 % IJ SOLN: 30.0000 mL | INTRAMUSCULAR | Status: DC | PRN

## 2017-01-06 MED ORDER — LACTATED RINGERS IV SOLN
INTRAVENOUS | Status: DC
  Administered 2017-01-06 – 2017-01-07 (×2): via INTRAVENOUS

## 2017-01-06 MED ORDER — FENTANYL CITRATE (PF) 100 MCG/2ML IJ SOLN
100.0000 ug | INTRAMUSCULAR | Status: DC | PRN
  Administered 2017-01-07: 100 ug via INTRAVENOUS
  Filled 2017-01-06: qty 2

## 2017-01-06 MED ORDER — ONDANSETRON HCL 4 MG/2ML IJ SOLN
4.0000 mg | Freq: Four times a day (QID) | INTRAMUSCULAR | Status: DC | PRN
  Administered 2017-01-07 – 2017-01-08 (×3): 4 mg via INTRAVENOUS
  Filled 2017-01-06 (×3): qty 2

## 2017-01-06 MED ORDER — FLEET ENEMA 7-19 GM/118ML RE ENEM
1.0000 | ENEMA | Freq: Every day | RECTAL | Status: DC | PRN
Start: 2017-01-06 — End: 2017-01-08

## 2017-01-06 MED ORDER — OXYTOCIN 40 UNITS IN LACTATED RINGERS INFUSION - SIMPLE MED: 2.5000 [IU]/h | INTRAVENOUS | Status: DC

## 2017-01-06 MED ORDER — LACTATED RINGERS IV SOLN: 500.0000 mL | INTRAVENOUS | Status: DC | PRN

## 2017-01-06 MED ORDER — ACETAMINOPHEN 325 MG PO TABS
650.0000 mg | ORAL_TABLET | ORAL | Status: DC | PRN
  Administered 2017-01-07 – 2017-01-08 (×5): 650 mg via ORAL
  Filled 2017-01-06 (×5): qty 2

## 2017-01-06 NOTE — Progress Notes (Signed)
High Risk Pregnancy HROB Diagnosis(es):   Decreased FM, recurrent, also today has Elevated pressure  G1P0 [redacted]w[redacted]d Estimated Date of Delivery: 01/10/17    HPI: The patient is being seen today for ongoing management of recurrent episodes of intermittent decr FM with reactive NST each time. . Today she reports that she's also had mild h/a x weeks, without any severe 'worst ever " headaches. Patient reports good fetal movement, denies any bleeding and no rupture of membranes symptoms or regular contractions.   BP weight and urine results reviewed and noted. Blood pressure (!) 140/100, pulse 84, weight 251 lb (113.9 kg), last menstrual period 04/05/2016.  First bp was 140/90  Fundal Height:  38 Fetal Heart rate:  145 Physical Examination: Abdomen - soft, nontender, nondistended, no masses or organomegaly                                     Pelvic - not done                                      Edema:  minimal  Urinalysis:NEGATIVE for  Protein                  POSITIVE for   Fetal Surveillance Testing today:  NST reactive   Assessment:  1.  Pregnancy at [redacted]w[redacted]d,  G1P0   :  Gest HTN                        2.  Reactive NST                         3.   Medication(s) Plans:  none  Treatment Plan:  Will refer to [facility] for IOL for Gest HTN at term  Follow up in 1 wk  weeks for appointment for [facility]

## 2017-01-06 NOTE — H&P (Signed)
LABOR AND DELIVERY ADMISSION HISTORY AND PHYSICAL NOTE  Tammy Chapman is a 26 y.o. female G1P0 with IUP at [redacted]w[redacted]d by LMP consistent with 6 week ultrasound presenting from clinic for IOL due to high blood pressure 140/90. She reports +FM, + contractions, No LOF, no VB, no blurry vision, headaches or peripheral edema, and RUQ pain.  She plans on breast feeding. Patient has a history of infertility and refuses birth control, notes she had to take clomid for this pregnancy due to history of PCOS.   Prenatal History/Complications:  Past Medical History: Past Medical History:  Diagnosis Date  . GERD (gastroesophageal reflux disease)   . Loose body of right knee 02/2013  . Mosquito bite 03/08/2013  . Sprain and strain of medial collateral ligament of knee 02/2013   right    Past Surgical History: Past Surgical History:  Procedure Laterality Date  . CHOLECYSTECTOMY  01/20/2008  . EAR TUBE REMOVAL  2002  . ESOPHAGOGASTRODUODENOSCOPY  05/15/2011   Procedure: ESOPHAGOGASTRODUODENOSCOPY (EGD);  Surgeon: Rogene Houston, MD;  [facility];  Service: Endoscopy;  Laterality: N/A;  3:15   . KNEE ARTHROSCOPY Right 03/15/2013   Procedure: RIGHT  KNEE ARTHROSCOPY, FEMORAL PATELLA REEFING, LATERAL RELEASE, PARTIAL LATERAL MENISCECTOMY ;  Surgeon: Lorn Junes, MD;  Location: [place];  Service: Orthopedics;  Laterality: Right;  . TONSILLECTOMY AND ADENOIDECTOMY  1999  . TYMPANOSTOMY TUBE PLACEMENT  1999, 2011    Obstetrical History: OB History    Gravida Para Term Preterm AB Living   1             SAB TAB Ectopic Multiple Live Births                  Social History: Social History   Social History  . Marital status: Single    Spouse name: N/A  . Number of children: N/A  . Years of education: N/A   Social History Main Topics  . Smoking status: Never Smoker  . Smokeless tobacco: Never Used  . Alcohol use No  . Drug use: No  . Sexual activity: Yes    Partners:  Male    Birth control/ protection: None   Other Topics Concern  . Not on file   Social History Narrative  . No narrative on file    Family History: Family History  Problem Relation Age of Onset  . Diabetes Mother   . Cancer Paternal Grandfather        renal  . Diabetes Maternal Grandfather   . COPD Maternal Grandfather   . Congestive Heart Failure Maternal Grandfather   . Eclampsia Sister   . Eclampsia Maternal Aunt   . Eclampsia Maternal Aunt   . Eclampsia Maternal Aunt     Allergies: Allergies  Allergen Reactions  . Benadryl [Diphenhydramine Hcl] Shortness Of Breath    Pt states that she feels fine if she sits down, but if she is moving around after taking benadryl, she feels SOB  . Penicillins Shortness Of Breath  . Adhesive [Tape] Hives  . Lactose Intolerance (Gi) Diarrhea and Nausea And Vomiting  . Latex Rash  . Sulfa Antibiotics Rash  . Ultram [Tramadol Hcl] Rash    Prescriptions Prior to Admission  Medication Sig Dispense Refill Last Dose  . Cefixime (SUPRAX) 400 MG CAPS capsule Take 400 mg by mouth daily.   Past Week at Unknown time  . cetirizine (ZYRTEC) 10 MG tablet Take 10 mg by mouth daily.  01/06/2017 at Unknown time  . ciprofloxacin-dexamethasone (CIPRODEX) otic suspension Place 4 drops into both ears 2 (two) times daily. 7.5 mL 0 01/05/2017 at Unknown time  . Doxylamine-Pyridoxine 10-10 MG TBEC 2 PO qhs; may take 1po in am and 1po in afternoon prn nausea 120 tablet 3 01/05/2017 at Unknown time  . fluticasone (FLONASE) 50 MCG/ACT nasal spray Place 2 sprays into both nostrils as needed.    Past Week at Unknown time  . Prenatal Multivit-Min-Fe-FA (PRENATAL VITAMINS PO) Take by mouth daily.    Past Week at Unknown time  . zolpidem (AMBIEN) 10 MG tablet Take 0.5 tablets (5 mg total) by mouth at bedtime as needed for sleep (use 1/2 to one tablet at bedtime for sleep). 10 tablet 0 Unknown at Unknown time     Review of Systems   All systems reviewed and  negative except as stated in HPI  Physical Exam Temperature 98.6 F (37 C), temperature source Oral, resp. rate 16, height 5\' 10"  (1.778 m), weight 250 lb (113.4 kg), last menstrual period 04/05/2016. General appearance: alert, cooperative and no distress Lungs: clear to auscultation bilaterally Heart: regular rate and rhythm Abdomen: soft, non-tender; bowel sounds normal Extremities: No calf swelling or tenderness Presentation: cephalic vertex Fetal monitoring: 145 bmp, moderate variability, accels, no decels Uterine activity: occasional contractions Dilation: Closed Effacement (%): Thick Station: -3 Exam by:: wouk  Prenatal labs: ABO, Rh: --/--/A POS (05/22 1805) Antibody: NEG (05/22 1805) Rubella: Immune RPR: Non Reactive (03/09 0917)  HBsAg: Negative (10/18 1218)  HIV: Non Reactive (03/09 0917)  GBS: Negative (05/06 0000) negative 2 hr Glucola: normal Genetic screening:  declined Anatomy US: normal female  Prenatal Transfer Tool  Maternal Diabetes: No Genetic Screening: Declined Maternal Ultrasounds/Referrals: Normal Fetal Ultrasounds or other Referrals:  None Maternal Substance Abuse:  No Significant Maternal Medications:  None Significant Maternal Lab Results: Lab values include: Group B Strep negative  Results for orders placed or performed during the hospital encounter of 01/06/17 (from the past 24 hour(s))  CBC   Collection Time: 01/06/17  6:05 PM  Result Value Ref Range   WBC 9.8 4.0 - 10.5 K/uL   RBC 4.49 3.87 - 5.11 MIL/uL   Hemoglobin 11.5 (L) 12.0 - 15.0 g/dL   HCT 35.5 (L) 36.0 - 46.0 %   MCV 79.1 78.0 - 100.0 fL   MCH 25.6 (L) 26.0 - 34.0 pg   MCHC 32.4 30.0 - 36.0 g/dL   RDW 13.7 11.5 - 15.5 %   Platelets 189 150 - 400 K/uL  Type and screen [place]   Collection Time: 01/06/17  6:05 PM  Result Value Ref Range   ABO/RH(D) A POS    Antibody Screen NEG    Sample Expiration 01/09/2017   Results for orders placed or performed  in visit on 01/06/17 (from the past 24 hour(s))  POCT urinalysis dipstick   Collection Time: 01/06/17  3:17 PM  Result Value Ref Range   Color, UA     Clarity, UA     Glucose, UA neg    Bilirubin, UA     Ketones, UA neg    Spec Grav, UA  1.010 - 1.025   Blood, UA neg    pH, UA  5.0 - 8.0   Protein, UA neg    Urobilinogen, UA  0.2 or 1.0 E.U./dL   Nitrite, UA neg    Leukocytes, UA Negative Negative    Patient Active Problem List   Diagnosis Date  Noted  . Normal labor 01/06/2017  . Chronic ear infection, bilateral 07/03/2016  . Supervision of normal first pregnancy 06/04/2016  . 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 Anovulation 02/01/2016  . Knee pain 04/08/2012  . Knee stiffness 04/08/2012  . Difficulty in walking(719.7) 04/08/2012  . Abnormality of gait 04/08/2012  . GERD (gastroesophageal reflux disease) 08/04/2011  . Abdominal pain 08/04/2011  . Diarrhea 08/04/2011    Assessment: Tammy Chapman is a 26 y.o. G1P0 at [redacted]w[redacted]d here for IOL due to gHTN.  She denies abdominal pain or changes in vision but does note headache that has been persistent for the past two weeks.   Gestational HTN - Will check CMP, CBC, and urine protein:cr ratio - monitor BP  Induction of Labor #Labor: start cytotec vaginally q4H and plan to insert foley bulb when able #Pain: Controlled, no pain currently #FWB: Category I tracing #ID:  GBS negative #MOF: Breast feeding #MOC: Declines (discussed risks/benefits, recommended LARC) #Circ:  Yes, at hospital  Everrett Coombe, MD PGY-1 [place] for [facility], [facility] 01/06/2017, 6:59 PM

## 2017-01-06 NOTE — Progress Notes (Signed)
Labor Progress Note  Tammy Chapman is a 26 y.o. G1P0 at [redacted]w[redacted]d  admitted for induction of labor due to gestational hypertension.   S: Patient is currently comfortable and not complaining of pain.   O:  BP 118/69   Pulse 87   Temp 98.8 F (37.1 C)   Resp 16   Ht 5\' 10"  (1.778 m)   Wt 250 lb (113.4 kg)   LMP 04/05/2016 (Exact Date)   BMI 35.87 kg/m   No intake/output data recorded.  FHT:  Not currently monitored  UC:   N/A SVE:   Dilation: Closed Effacement (%): Thick Station: -3 Exam by:: wouk SROM/AROM: Intact  None  Labs: Lab Results  Component Value Date   WBC 9.8 01/06/2017   HGB 11.5 (L) 01/06/2017   HCT 35.5 (L) 01/06/2017   MCV 79.1 01/06/2017   PLT 189 01/06/2017    Assessment / Plan: 26 y.o. G1P0 [redacted]w[redacted]d  Induction of labor due to gestational hypertension  Labor: Progressing normally Fetal Wellbeing:  N/A Pain Control:  Not currently on pain meds, will start when requested Anticipated MOD:  NSVD  Expectant management  Tammy Chapman Student  RESIDENT ADDENDUM  I have read the above note and agree with the medical student's findings and plan.  Adin Hector, MD, MPH PGY-2 [facility]

## 2017-01-07 ENCOUNTER — Inpatient Hospital Stay (HOSPITAL_COMMUNITY): Payer: 59 | Admitting: Anesthesiology

## 2017-01-07 LAB — CBC
HCT: 34.6 % — ABNORMAL LOW (ref 36.0–46.0)
Hemoglobin: 11.2 g/dL — ABNORMAL LOW (ref 12.0–15.0)
MCH: 25.8 pg — ABNORMAL LOW (ref 26.0–34.0)
MCHC: 32.4 g/dL (ref 30.0–36.0)
MCV: 79.7 fL (ref 78.0–100.0)
Platelets: 165 10*3/uL (ref 150–400)
RBC: 4.34 MIL/uL (ref 3.87–5.11)
RDW: 13.9 % (ref 11.5–15.5)
WBC: 11 10*3/uL — ABNORMAL HIGH (ref 4.0–10.5)

## 2017-01-07 LAB — PROTEIN / CREATININE RATIO, URINE
Creatinine, Urine: 41 mg/dL
Total Protein, Urine: <6 mg/dL

## 2017-01-07 LAB — RPR: RPR Ser Ql: NONREACTIVE

## 2017-01-07 MED ORDER — EPHEDRINE 5 MG/ML INJ: 10.0000 mg | INTRAVENOUS | Status: DC | PRN

## 2017-01-07 MED ORDER — PHENYLEPHRINE 40 MCG/ML (10ML) SYRINGE FOR IV PUSH (FOR BLOOD PRESSURE SUPPORT)
80.0000 ug | PREFILLED_SYRINGE | INTRAVENOUS | Status: DC | PRN
  Filled 2017-01-07: qty 10

## 2017-01-07 MED ORDER — DIPHENHYDRAMINE HCL 50 MG/ML IJ SOLN: 12.5000 mg | INTRAMUSCULAR | Status: DC | PRN

## 2017-01-07 MED ORDER — BUTORPHANOL TARTRATE 1 MG/ML IJ SOLN
2.0000 mg | Freq: Once | INTRAMUSCULAR | Status: AC
  Administered 2017-01-07: 2 mg via INTRAVENOUS
  Filled 2017-01-07: qty 2

## 2017-01-07 MED ORDER — LACTATED RINGERS IV SOLN: 500.0000 mL | Freq: Once | INTRAVENOUS | Status: DC

## 2017-01-07 MED ORDER — SODIUM BICARBONATE 8.4 % IV SOLN
INTRAVENOUS | Status: DC | PRN
  Administered 2017-01-07 – 2017-01-08 (×4): 5 mL via EPIDURAL

## 2017-01-07 MED ORDER — LIDOCAINE HCL (PF) 1 % IJ SOLN
INTRAMUSCULAR | Status: DC | PRN
  Administered 2017-01-07 (×2): 6 mL via EPIDURAL

## 2017-01-07 MED ORDER — PHENYLEPHRINE 40 MCG/ML (10ML) SYRINGE FOR IV PUSH (FOR BLOOD PRESSURE SUPPORT): 80.0000 ug | PREFILLED_SYRINGE | INTRAVENOUS | Status: DC | PRN

## 2017-01-07 MED ORDER — NALBUPHINE HCL 10 MG/ML IJ SOLN
10.0000 mg | INTRAMUSCULAR | Status: DC | PRN
  Administered 2017-01-07 – 2017-01-08 (×2): 10 mg via INTRAVENOUS
  Filled 2017-01-07 (×2): qty 1

## 2017-01-07 MED ORDER — FENTANYL 2.5 MCG/ML BUPIVACAINE 1/10 % EPIDURAL INFUSION (WH - ANES)
14.0000 mL/h | INTRAMUSCULAR | Status: DC | PRN
Start: 2017-01-07 — End: 2017-01-08
  Administered 2017-01-07 – 2017-01-08 (×2): 14 mL/h via EPIDURAL
  Filled 2017-01-07 (×2): qty 100

## 2017-01-07 MED ORDER — OXYTOCIN 40 UNITS IN LACTATED RINGERS INFUSION - SIMPLE MED
1.0000 m[IU]/min | INTRAVENOUS | Status: DC
  Administered 2017-01-07: 2 m[IU]/min via INTRAVENOUS
  Filled 2017-01-07 (×2): qty 1000

## 2017-01-07 MED ORDER — TERBUTALINE SULFATE 1 MG/ML IJ SOLN: 0.2500 mg | Freq: Once | INTRAMUSCULAR | Status: DC | PRN

## 2017-01-07 NOTE — Progress Notes (Signed)
RN at bedside intermittently from 12:20-12:50 adjusting monitor and repositioning pt. FHR difficult to trace. MD notified to AROM and place internal monitors.  RN assessing and will continue to monitor pt.

## 2017-01-07 NOTE — Progress Notes (Signed)
During report/shift change, patient refused to allow RN to put side-rails up.  RN informed charge nurse of situation and educated patient on the reason of side rails for safety purposes. Patient continued to refuse side rails.  Will continue to monitor. Tresa Endo

## 2017-01-07 NOTE — Progress Notes (Signed)
Patient ID: Tammy Chapman, female   DOB: 07-29-1991, 26 y.o.   MRN: [id number]  Pit not increased since 1630; MVUs no longer adequate, so not surprising about no cervical change. Pit bag to be changed out and also will start to be increased by 14mu/min to reach adequate MVUs. BP 131/79; FHR 125-130s, +accels, no decels.  Serita Grammes 01/07/2017 11:48 PM

## 2017-01-07 NOTE — Progress Notes (Signed)
S: C/o lumbar back pain, worse with contraction. Patient states fentanyl did not help earlier today. Pain is no worse with palpation and is across her lumbar back.   O:  Vitals:   01/07/17 1105 01/07/17 1215 01/07/17 1331 01/07/17 1432  BP: 135/84 137/75 121/69 121/78  Pulse: 90 93 76 81  Resp: 16 18 16 16   Temp:   98.5 F (36.9 C)   TempSrc:   Oral   SpO2:      Weight:      Height:        Dilation: 5 Effacement (%): 60 Cervical Position: Posterior Station: -3 Presentation: Vertex Exam by:: Chestine Spore, MD   FHT: 120bpm, mod var, +accels, no decels TOCO: q55min  A/P: - Category I tracing - For back pain with contractions, will try  changing positioning - patient agreeable to moving to her side with peanut. - patient does not want an epidural  Continue expectant management Anticipate SVD

## 2017-01-07 NOTE — Anesthesia Pain Management Evaluation Note (Signed)
  CRNA Pain Management Visit Note  Patient: Tammy Chapman, 26 y.o., female  "Hello I am a member of the anesthesia team at [facility]. We have an anesthesia team available at all times to provide care throughout the hospital, including epidural management and anesthesia for C-section. I don't know your plan for the delivery whether it a natural birth, water birth, IV sedation, nitrous supplementation, doula or epidural, but we want to meet your pain goals."   1.Was your pain managed to your expectations on prior hospitalizations?   No prior hospitalizations  2.What is your expectation for pain management during this hospitalization?     Epidural  3.How can we help you reach that goal?   Record the patient's initial score and the patient's pain goal.   Pain: 6  Pain Goal: 8 [facility] wants you to be able to say your pain was always managed very well.  Jabier Mutton 01/07/2017

## 2017-01-07 NOTE — Progress Notes (Signed)
Labor Progress Note  Tammy Chapman is a 26 y.o. G1P0 at [redacted]w[redacted]d  admitted for induction of labor due to gestational hypertension.  S: Patient is currently complaining of back pain and request Tylenol. Patient is otherwise fine.   O:  BP 126/78   Pulse 82   Temp 98.6 F (37 C) (Oral)   Resp 16   Ht 5\' 10"  (1.778 m)   Wt 113.4 kg (250 lb)   LMP 04/05/2016 (Exact Date)   BMI 35.87 kg/m   No intake/output data recorded.  FHT:  Not currently monitored UC:   N/A SVE:   Dilation: Closed Effacement (%): Thick Station: -3 Exam by:: wouk SROM/AROM: Intact  Pitocin @ 0 mu/min  Labs: Lab Results  Component Value Date   WBC 9.8 01/06/2017   HGB 11.5 (L) 01/06/2017   HCT 35.5 (L) 01/06/2017   MCV 79.1 01/06/2017   PLT 189 01/06/2017    Assessment / Plan: 26 y.o. G1P0 [redacted]w[redacted]d Induction of labor due to gestational hypertension  Labor: Progressing normally Fetal Wellbeing:  N/A Pain Control:  Tylenol Anticipated MOD:  NSVD  Expectant management  Salvadore Dom Medical Student 01/07/2017 3:26 AM    RESIDENT ADDENDUM  I have read the above note and agree with the medical student's findings.  Adin Hector, MD, MPH PGY-2 [facility] Pager [phone]

## 2017-01-07 NOTE — Progress Notes (Signed)
Tammy Chapman is a 26 y.o. G1P0 at [redacted]w[redacted]d by LMP admitted for induction of labor due to [facility].  Subjective: Pt comfortable, not feeling contractions. FOB in room for support.  Objective: BP 129/77   Pulse 66   Temp 98.2 F (36.8 C) (Oral)   Resp 16   Ht 5\' 10"  (1.778 m)   Wt 250 lb (113.4 kg)   LMP 04/05/2016 (Exact Date)   BMI 35.87 kg/m  No intake/output data recorded. No intake/output data recorded.  FHT:  FHR: 125 bpm, variability: moderate,  accelerations:  Present,  decelerations:  Absent UC:   irregular, every 2-4 minutes, mild to palpation SVE:   Dilation: 1 Effacement (%): 50 Station: -3 Exam by:: Leftwich-Kirby, CNM Foley bulb placed, Cytotec held at this time r/t frequent contractions Pt tolerated well   Labs: Lab Results  Component Value Date   WBC 9.8 01/06/2017   HGB 11.5 (L) 01/06/2017   HCT 35.5 (L) 01/06/2017   MCV 79.1 01/06/2017   PLT 189 01/06/2017    Assessment / Plan: Induction of labor due to GHTN FB in place  Labor: Progressing normally.  Hold Cytotec at this time.  Stadol 2 mg IV x 1 dose to help pt sleep.  Pt has allergy to Percocet and Tramadol but is able to take Vicodin and other narcotic medications.  Will try Stadol, pt to notify RN if any allergic reaction symptoms. Preeclampsia:  no signs or symptoms of toxicity and labs stable Fetal Wellbeing:  Category I Pain Control:  Labor support without medications I/D:  GBS negative Anticipated MOD:  NSVD  Fatima Blank 01/07/2017, 3:53 AM

## 2017-01-07 NOTE — Anesthesia Preprocedure Evaluation (Addendum)
Anesthesia Evaluation  Patient identified by MRN, date of birth, ID band Patient awake    Reviewed: Allergy & Precautions, NPO status , Patient's Chart, lab work & pertinent test results  History of Anesthesia Complications Negative for: history of anesthetic complications  Airway Mallampati: II  TM Distance: >3 FB Neck ROM: Full    Dental  (+) Dental Advisory Given   Pulmonary neg pulmonary ROS,    breath sounds clear to auscultation       Cardiovascular (-) hypertensionnegative cardio ROS   Rhythm:Regular Rate:Normal     Neuro/Psych negative neurological ROS     GI/Hepatic Neg liver ROS, GERD  Medicated and Poorly Controlled,  Endo/Other  Morbid obesity  Renal/GU negative Renal ROS     Musculoskeletal   Abdominal (+) + obese,   Peds  Hematology plt 165k   Anesthesia Other Findings   Reproductive/Obstetrics (+) Pregnancy                            Anesthesia Physical Anesthesia Plan  ASA: II  Anesthesia Plan: Epidural   Post-op Pain Management:    Induction:   Airway Management Planned: Natural Airway  Additional Equipment:   Intra-op Plan:   Post-operative Plan:   Informed Consent: I have reviewed the patients History and Physical, chart, labs and discussed the procedure including the risks, benefits and alternatives for the proposed anesthesia with the patient or authorized representative who has indicated his/her understanding and acceptance.   Dental advisory given  Plan Discussed with: CRNA and Surgeon  Anesthesia Plan Comments: (Patient identified. Risks/Benefits/Options discussed with patient including but not limited to bleeding, infection, nerve damage, paralysis, failed block, incomplete pain control, headache, blood pressure changes, nausea, vomiting, reactions to medication both or allergic, itching and postpartum back pain. Confirmed with bedside nurse the  patient's most recent platelet count. Confirmed with patient that they are not currently taking any anticoagulation, have any bleeding history or any family history of bleeding disorders. Patient expressed understanding and wished to proceed. All questions were answered. )        Anesthesia Quick Evaluation

## 2017-01-07 NOTE — Progress Notes (Addendum)
Labor Progress Note  Tammy Chapman is a 26 y.o. G1P0 at [redacted]w[redacted]d  admitted for induction of labor due to Hypertension.  S: Patient is doing well this morning. She had some nausea which was palliated by zofran. Foley bulb came out this AM. No headaches or blurry vision. Cramping pain improved now with foley out.  O:  BP 128/78   Pulse 80   Temp 98.7 F (37.1 C) (Oral)   Resp 18   Ht 5\' 10"  (1.778 m)   Wt 250 lb (113.4 kg)   LMP 04/05/2016 (Exact Date)   SpO2 100%   BMI 35.87 kg/m   FHT:  FHR: 120 bpm, variability: moderate,  accelerations:  Present,  decelerations:  Absent UC:   Irregular/occasional  Dilation: 5 cm Effacement 60% Station -3  Pitocin start @ 2 mu/min, inc by 2 mu/min/hr  Labs: Lab Results  Component Value Date   WBC 9.8 01/06/2017   HGB 11.5 (L) 01/06/2017   HCT 35.5 (L) 01/06/2017   MCV 79.1 01/06/2017   PLT 189 01/06/2017   Protein/Cr 0.14 Cr 0.68  Assessment / Plan: 26 y.o. G1P0 [redacted]w[redacted]d  in early/active labor. Induction of labor due to elevated blood pressures,  progressing well.  BP well controlled this AM and preE labs were negative yesterday, no symptoms of blurry vision/HA/abd pain this AM.  Progressing now s/p cytotec and foley.  Will initiate pitocin.   Labor: Progressing normally Fetal Wellbeing:  Category I Pain Control:  IV pain meds Anticipated MOD:  NSVD  Expectant management 10:50AM U/S to confirm vertex, will initiate pitocin.   Everrett Coombe, MD PGY-1 [facility]

## 2017-01-07 NOTE — Progress Notes (Signed)
Labor Progress Note  Tammy Chapman is a 26 y.o. G1P0 at [redacted]w[redacted]d  admitted for induction of labor due to Hypertension.  S: Epidural recently redosed and patient now denying any pain or pressure. Feeling as if she could take a nap.  Reporting some nausea but does not want Zofran at this time.   O:  BP 124/68   Pulse 94   Temp 98.2 F (36.8 C) (Oral)   Resp 16   Ht 5\' 10"  (1.778 m)   Wt 250 lb (113.4 kg)   LMP 04/05/2016 (Exact Date)   SpO2 99%   BMI 35.87 kg/m   No intake/output data recorded.  FHT:  FHR: 125-130 bpm, variability: minimal ,  accelerations:  Present,  decelerations:  Absent UC:   regular, every 2-3 minutes SVE:   Dilation: 6.5 Effacement (%): 70 Station: -2 Exam by:: Wallis Mart, RN SROM/AROM: AROM clear 1245  Pitocin @ 12 mu/min  Labs: Lab Results  Component Value Date   WBC 11.0 (H) 01/07/2017   HGB 11.2 (L) 01/07/2017   HCT 34.6 (L) 01/07/2017   MCV 79.7 01/07/2017   PLT 165 01/07/2017    Assessment / Plan: 26 y.o. G1P0 [redacted]w[redacted]d presenting for IOL for gestational HTN in active labor. Induction of labor due to gestational hypertension,  progressing well on pitocin  Labor: Progressing on pitocin Fetal Wellbeing:  Category I Pain Control:  Epidural Anticipated MOD:  NSVD  Expectant management   Adin Hector, MD, MPH PGY-2 [facility]

## 2017-01-07 NOTE — Anesthesia Procedure Notes (Signed)
Epidural Patient location during procedure: OB Start time: 01/07/2017 6:33 PM End time: 01/07/2017 6:54 PM  Staffing Anesthesiologist: Annye Asa Performed: anesthesiologist   Preanesthetic Checklist Completed: patient identified, surgical consent, pre-op evaluation, timeout performed, IV checked, risks and benefits discussed and monitors and equipment checked  Epidural Patient position: sitting Prep: site prepped and draped and DuraPrep Patient monitoring: blood pressure, continuous pulse ox and heart rate Approach: midline Location: L2-L3 Injection technique: LOR air  Needle:  Needle type: Tuohy  Needle gauge: 17 G Needle length: 9 cm Needle insertion depth: 5.5 cm Catheter type: closed end flexible Catheter size: 19 Gauge Catheter at skin depth: 11 cm Test dose: negative (1% lidocaine)  Assessment Events: blood not aspirated, injection not painful, no injection resistance, negative IV test and no paresthesia  Additional Notes Pt identified in Labor room.  Monitors applied. Working IV access confirmed. Sterile prep, drape lumbar spine.  1% lido local L 2,3.  #17ga Touhy LOR air at 5.5 cm L 2,3, cath in easily to 11 cm skin. Test dose OK, cath dosed and infusion begun.  Patient asymptomatic, VSS, no heme aspirated, tolerated well.  Jenita Seashore, MDReason for block:procedure for pain

## 2017-01-07 NOTE — Progress Notes (Signed)
Labor Progress Note  Tammy Chapman is a 26 y.o. G1P0 at [redacted]w[redacted]d  admitted for induction of labor due to Hypertension.  S: Patient still reporting low back pain with contractions. No complaints otherwise.   O:  BP 118/72   Pulse 87   Temp 98.2 F (36.8 C) (Oral)   Resp 16   Ht 5\' 10"  (1.778 m)   Wt 250 lb (113.4 kg)   LMP 04/05/2016 (Exact Date)   SpO2 99%   BMI 35.87 kg/m   No intake/output data recorded.  FHT:  FHR: 130 bpm, variability: moderate,  accelerations:  Present,  decelerations:  Absent UC:   regular, every 3 minutes SVE:   Dilation: 6.5 Effacement (%): 70 Station: -2 Exam by:: Wallis Mart, RN SROM/AROM: AROM clear 1245  Pitocin @ 12 mu/min  Labs: Lab Results  Component Value Date   WBC 11.0 (H) 01/07/2017   HGB 11.2 (L) 01/07/2017   HCT 34.6 (L) 01/07/2017   MCV 79.7 01/07/2017   PLT 165 01/07/2017    Assessment / Plan: 26 y.o. G1P0 [redacted]w[redacted]d presenting for IOL for gestational HTN in active labor. Induction of labor due to gestational hypertension,  progressing well on pitocin  Labor: Progressing on pitocin Fetal Wellbeing:  Category I Pain Control:  Epidural Anticipated MOD:  NSVD  Expectant management   Adin Hector, MD, MPH PGY-2 [facility]

## 2017-01-07 NOTE — Progress Notes (Signed)
Pain is well controlled with IV meds. AROM at 12:45 with clear fluid. IUPC placed. Category I tracing.

## 2017-01-08 ENCOUNTER — Encounter (HOSPITAL_COMMUNITY)
Admission: AD | Disposition: A | Discharge status: Home / Self Care | Payer: Self-pay | Source: Ambulatory Visit | Attending: Obstetrics and Gynecology

## 2017-01-08 ENCOUNTER — Encounter (HOSPITAL_COMMUNITY): Payer: Self-pay | Admitting: Obstetrics and Gynecology

## 2017-01-08 DIAGNOSIS — Z3A39 39 weeks gestation of pregnancy: Secondary | ICD-10-CM

## 2017-01-08 DIAGNOSIS — O134 Gestational [pregnancy-induced] hypertension without significant proteinuria, complicating childbirth: Secondary | ICD-10-CM

## 2017-01-08 DIAGNOSIS — O139 Gestational [pregnancy-induced] hypertension without significant proteinuria, unspecified trimester: Secondary | ICD-10-CM

## 2017-01-08 SURGERY — Surgical Case
Anesthesia: Epidural

## 2017-01-08 MED ORDER — LACTATED RINGERS IV SOLN
INTRAVENOUS | Status: DC | PRN
  Administered 2017-01-08: 06:00:00 via INTRAVENOUS

## 2017-01-08 MED ORDER — ONDANSETRON HCL 4 MG/2ML IJ SOLN
INTRAMUSCULAR | Status: DC | PRN
  Administered 2017-01-08: 4 mg via INTRAVENOUS

## 2017-01-08 MED ORDER — MIDAZOLAM HCL 2 MG/2ML IJ SOLN: 0.5000 mg | Freq: Once | INTRAMUSCULAR | Status: DC | PRN

## 2017-01-08 MED ORDER — MORPHINE SULFATE (PF) 0.5 MG/ML IJ SOLN
INTRAMUSCULAR | Status: AC
  Filled 2017-01-08: qty 10

## 2017-01-08 MED ORDER — SODIUM CHLORIDE 0.9% FLUSH: 3.0000 mL | INTRAVENOUS | Status: DC | PRN

## 2017-01-08 MED ORDER — NALBUPHINE HCL 10 MG/ML IJ SOLN: 5.0000 mg | Freq: Once | INTRAMUSCULAR | Status: DC | PRN

## 2017-01-08 MED ORDER — SODIUM CHLORIDE 0.9 % IR SOLN
Status: DC | PRN
  Administered 2017-01-08: 1

## 2017-01-08 MED ORDER — ONDANSETRON HCL 4 MG/2ML IJ SOLN: 4.0000 mg | Freq: Three times a day (TID) | INTRAMUSCULAR | Status: DC | PRN

## 2017-01-08 MED ORDER — DIPHENHYDRAMINE HCL 50 MG/ML IJ SOLN
INTRAMUSCULAR | Status: DC | PRN
  Administered 2017-01-08: 25 mg via INTRAVENOUS

## 2017-01-08 MED ORDER — KETOROLAC TROMETHAMINE 30 MG/ML IJ SOLN
30.0000 mg | Freq: Four times a day (QID) | INTRAMUSCULAR | Status: AC | PRN
  Administered 2017-01-08: 30 mg via INTRAMUSCULAR

## 2017-01-08 MED ORDER — SIMETHICONE 80 MG PO CHEW
80.0000 mg | CHEWABLE_TABLET | Freq: Three times a day (TID) | ORAL | Status: DC
  Administered 2017-01-08 – 2017-01-10 (×7): 80 mg via ORAL
  Filled 2017-01-08 (×7): qty 1

## 2017-01-08 MED ORDER — KETOROLAC TROMETHAMINE 30 MG/ML IJ SOLN: 30.0000 mg | Freq: Four times a day (QID) | INTRAMUSCULAR | Status: AC | PRN

## 2017-01-08 MED ORDER — TETANUS-DIPHTH-ACELL PERTUSSIS 5-2.5-18.5 LF-MCG/0.5 IM SUSP: 0.5000 mL | Freq: Once | INTRAMUSCULAR | Status: DC

## 2017-01-08 MED ORDER — SENNOSIDES-DOCUSATE SODIUM 8.6-50 MG PO TABS
2.0000 | ORAL_TABLET | ORAL | Status: DC
  Administered 2017-01-09 (×2): 2 via ORAL
  Filled 2017-01-08 (×2): qty 2

## 2017-01-08 MED ORDER — ACETAMINOPHEN 325 MG PO TABS
650.0000 mg | ORAL_TABLET | ORAL | Status: DC | PRN
  Administered 2017-01-09: 650 mg via ORAL
  Filled 2017-01-08: qty 2

## 2017-01-08 MED ORDER — PRENATAL MULTIVITAMIN CH
1.0000 | ORAL_TABLET | Freq: Every day | ORAL | Status: DC
  Administered 2017-01-09: 1 via ORAL
  Filled 2017-01-08 (×3): qty 1

## 2017-01-08 MED ORDER — SIMETHICONE 80 MG PO CHEW: 80.0000 mg | CHEWABLE_TABLET | ORAL | Status: DC | PRN

## 2017-01-08 MED ORDER — PROMETHAZINE HCL 25 MG/ML IJ SOLN: 6.2500 mg | INTRAMUSCULAR | Status: DC | PRN

## 2017-01-08 MED ORDER — LACTATED RINGERS IV SOLN
INTRAVENOUS | Status: DC
  Administered 2017-01-08: 16:00:00 via INTRAVENOUS

## 2017-01-08 MED ORDER — NALBUPHINE HCL 10 MG/ML IJ SOLN
5.0000 mg | INTRAMUSCULAR | Status: DC | PRN
  Administered 2017-01-08: 5 mg via INTRAVENOUS
  Filled 2017-01-08: qty 1

## 2017-01-08 MED ORDER — DIPHENHYDRAMINE HCL 25 MG PO CAPS
25.0000 mg | ORAL_CAPSULE | ORAL | Status: DC | PRN
  Filled 2017-01-08: qty 1

## 2017-01-08 MED ORDER — MENTHOL 3 MG MT LOZG: 1.0000 | LOZENGE | OROMUCOSAL | Status: DC | PRN

## 2017-01-08 MED ORDER — DIPHENHYDRAMINE HCL 50 MG/ML IJ SOLN: 12.5000 mg | INTRAMUSCULAR | Status: DC | PRN

## 2017-01-08 MED ORDER — ZOLPIDEM TARTRATE 5 MG PO TABS: 5.0000 mg | ORAL_TABLET | Freq: Every evening | ORAL | Status: DC | PRN

## 2017-01-08 MED ORDER — MEPERIDINE HCL 25 MG/ML IJ SOLN: 6.2500 mg | INTRAMUSCULAR | Status: DC | PRN

## 2017-01-08 MED ORDER — MORPHINE SULFATE (PF) 0.5 MG/ML IJ SOLN
INTRAMUSCULAR | Status: DC | PRN
  Administered 2017-01-08: .5 mg via EPIDURAL
  Administered 2017-01-08: 4 mg via EPIDURAL
  Administered 2017-01-08: .5 mg via INTRAVENOUS

## 2017-01-08 MED ORDER — OXYTOCIN 40 UNITS IN LACTATED RINGERS INFUSION - SIMPLE MED: 2.5000 [IU]/h | INTRAVENOUS | Status: AC

## 2017-01-08 MED ORDER — BUPIVACAINE HCL (PF) 0.5 % IJ SOLN
INTRAMUSCULAR | Status: AC
  Filled 2017-01-08: qty 30

## 2017-01-08 MED ORDER — IBUPROFEN 600 MG PO TABS
600.0000 mg | ORAL_TABLET | Freq: Four times a day (QID) | ORAL | Status: DC
  Administered 2017-01-08 – 2017-01-10 (×8): 600 mg via ORAL
  Filled 2017-01-08 (×9): qty 1

## 2017-01-08 MED ORDER — DEXAMETHASONE SODIUM PHOSPHATE 4 MG/ML IJ SOLN
INTRAMUSCULAR | Status: DC | PRN
  Administered 2017-01-08: 4 mg via INTRAVENOUS

## 2017-01-08 MED ORDER — NALBUPHINE HCL 10 MG/ML IJ SOLN: 5.0000 mg | INTRAMUSCULAR | Status: DC | PRN

## 2017-01-08 MED ORDER — WITCH HAZEL-GLYCERIN EX PADS: 1.0000 "application " | MEDICATED_PAD | CUTANEOUS | Status: DC | PRN

## 2017-01-08 MED ORDER — KETOROLAC TROMETHAMINE 30 MG/ML IJ SOLN
INTRAMUSCULAR | Status: AC
  Filled 2017-01-08: qty 1

## 2017-01-08 MED ORDER — MEPERIDINE HCL 25 MG/ML IJ SOLN
INTRAMUSCULAR | Status: AC
  Filled 2017-01-08: qty 1

## 2017-01-08 MED ORDER — MEPERIDINE HCL 25 MG/ML IJ SOLN
INTRAMUSCULAR | Status: DC | PRN
  Administered 2017-01-08 (×2): 12.5 mg via INTRAVENOUS

## 2017-01-08 MED ORDER — DIBUCAINE 1 % RE OINT: 1.0000 "application " | TOPICAL_OINTMENT | RECTAL | Status: DC | PRN

## 2017-01-08 MED ORDER — COCONUT OIL OIL: 1.0000 "application " | TOPICAL_OIL | Status: DC | PRN

## 2017-01-08 MED ORDER — CLINDAMYCIN PHOSPHATE 900 MG/50ML IV SOLN: 900.0000 mg | Freq: Once | INTRAVENOUS | Status: DC

## 2017-01-08 MED ORDER — NALOXONE HCL 0.4 MG/ML IJ SOLN: 0.4000 mg | INTRAMUSCULAR | Status: DC | PRN

## 2017-01-08 MED ORDER — SCOPOLAMINE 1 MG/3DAYS TD PT72: 1.0000 | MEDICATED_PATCH | Freq: Once | TRANSDERMAL | Status: DC

## 2017-01-08 MED ORDER — PHENYLEPHRINE HCL 10 MG/ML IJ SOLN
INTRAMUSCULAR | Status: DC | PRN
  Administered 2017-01-08: 80 ug via INTRAVENOUS

## 2017-01-08 MED ORDER — SIMETHICONE 80 MG PO CHEW
80.0000 mg | CHEWABLE_TABLET | ORAL | Status: DC
  Administered 2017-01-09 (×2): 80 mg via ORAL
  Filled 2017-01-08 (×2): qty 1

## 2017-01-08 MED ORDER — GENTAMICIN SULFATE 40 MG/ML IJ SOLN
Freq: Once | INTRAVENOUS | Status: AC
  Administered 2017-01-08: 116.75 mL via INTRAVENOUS
  Filled 2017-01-08: qty 10.75

## 2017-01-08 MED ORDER — NALOXONE HCL 2 MG/2ML IJ SOSY
1.0000 ug/kg/h | PREFILLED_SYRINGE | INTRAMUSCULAR | Status: DC | PRN
  Filled 2017-01-08: qty 2

## 2017-01-08 MED ORDER — MORPHINE SULFATE (PF) 4 MG/ML IV SOLN: 1.0000 mg | INTRAVENOUS | Status: DC | PRN

## 2017-01-08 MED ORDER — PANTOPRAZOLE SODIUM 40 MG PO TBEC
40.0000 mg | DELAYED_RELEASE_TABLET | Freq: Every day | ORAL | Status: DC
  Filled 2017-01-08 (×2): qty 1

## 2017-01-08 SURGICAL SUPPLY — 38 items
APL SKNCLS STERI-STRIP NONHPOA (GAUZE/BANDAGES/DRESSINGS) ×1
BENZOIN TINCTURE PRP APPL 2/3 (GAUZE/BANDAGES/DRESSINGS) ×1 IMPLANT
CHLORAPREP W/TINT 26ML (MISCELLANEOUS) ×2 IMPLANT
CLAMP CORD UMBIL (MISCELLANEOUS) IMPLANT
CLOSURE STERI STRIP 1/2 X4 (GAUZE/BANDAGES/DRESSINGS) ×1 IMPLANT
CLOTH BEACON ORANGE TIMEOUT ST (SAFETY) ×2 IMPLANT
DRSG OPSITE POSTOP 4X10 (GAUZE/BANDAGES/DRESSINGS) ×2 IMPLANT
ELECT REM PT RETURN 9FT ADLT (ELECTROSURGICAL) ×2
ELECTRODE REM PT RTRN 9FT ADLT (ELECTROSURGICAL) ×1 IMPLANT
EXTRACTOR VACUUM KIWI (MISCELLANEOUS) IMPLANT
GLOVE BIO SURGEON ST LM GN SZ9 (GLOVE) ×2 IMPLANT
GLOVE BIOGEL PI IND STRL 7.0 (GLOVE) ×1 IMPLANT
GLOVE BIOGEL PI IND STRL 9 (GLOVE) ×1 IMPLANT
GLOVE BIOGEL PI INDICATOR 7.0 (GLOVE) ×1
GLOVE BIOGEL PI INDICATOR 9 (GLOVE) ×1
GOWN STRL REUS W/TWL 2XL LVL3 (GOWN DISPOSABLE) ×2 IMPLANT
GOWN STRL REUS W/TWL LRG LVL3 (GOWN DISPOSABLE) ×2 IMPLANT
NDL HYPO 25X5/8 SAFETYGLIDE (NEEDLE) IMPLANT
NEEDLE HYPO 25X5/8 SAFETYGLIDE (NEEDLE) IMPLANT
NS IRRIG 1000ML POUR BTL (IV SOLUTION) ×2 IMPLANT
PACK C SECTION WH (CUSTOM PROCEDURE TRAY) ×2 IMPLANT
PAD OB MATERNITY 4.3X12.25 (PERSONAL CARE ITEMS) ×2 IMPLANT
PENCIL SMOKE EVAC W/HOLSTER (ELECTROSURGICAL) ×2 IMPLANT
RTRCTR C-SECT PINK 25CM LRG (MISCELLANEOUS) IMPLANT
RTRCTR C-SECT PINK 34CM XLRG (MISCELLANEOUS) IMPLANT
STRIP CLOSURE SKIN 1/2X4 (GAUZE/BANDAGES/DRESSINGS) IMPLANT
SUT MNCRL 0 VIOLET CTX 36 (SUTURE) ×2 IMPLANT
SUT MONOCRYL 0 CTX 36 (SUTURE) ×2
SUT PLAIN 2 0 (SUTURE) ×2
SUT PLAIN ABS 2-0 CT1 27XMFL (SUTURE) IMPLANT
SUT VIC AB 0 CT1 27 (SUTURE) ×2
SUT VIC AB 0 CT1 27XBRD ANBCTR (SUTURE) ×1 IMPLANT
SUT VIC AB 2-0 CT1 27 (SUTURE) ×2
SUT VIC AB 2-0 CT1 TAPERPNT 27 (SUTURE) ×1 IMPLANT
SUT VIC AB 4-0 KS 27 (SUTURE) ×2 IMPLANT
SYR BULB IRRIGATION 50ML (SYRINGE) IMPLANT
TOWEL OR 17X24 6PK STRL BLUE (TOWEL DISPOSABLE) ×2 IMPLANT
TRAY FOLEY BAG SILVER LF 14FR (SET/KITS/TRAYS/PACK) ×2 IMPLANT

## 2017-01-08 NOTE — Transfer of Care (Signed)
Immediate Anesthesia Transfer of Care Note  Patient: Tammy Chapman  Procedure(s) Performed: Procedure(s): CESAREAN SECTION (N/A)  Patient Location: PACU  Anesthesia Type:Epidural  Level of Consciousness: awake, alert  and oriented  Airway & Oxygen Therapy: Patient Spontanous Breathing  Post-op Assessment: Report given to RN and Post -op Vital signs reviewed and stable  Post vital signs: Reviewed and stable  Last Vitals:  Vitals:   01/08/17 0431 01/08/17 0511  BP: 139/78 127/80  Pulse: (!) 110 82  Resp: 16 16  Temp:      Last Pain:  Vitals:   01/08/17 0500  TempSrc:   PainSc: 3       Patients Stated Pain Goal: 3 (95/09/32 6712)  Complications: No apparent anesthesia complications

## 2017-01-08 NOTE — Addendum Note (Signed)
Addendum  created 01/08/17 1541 by Jonna Munro, CRNA   Sign clinical note

## 2017-01-08 NOTE — Anesthesia Postprocedure Evaluation (Signed)
Anesthesia Post Note  Patient: Tammy Chapman  Procedure(s) Performed: Procedure(s) (LRB): CESAREAN SECTION (N/A)  Patient location during evaluation: Mother Baby Anesthesia Type: Epidural Level of consciousness: awake and alert and oriented Pain management: pain level controlled Vital Signs Assessment: post-procedure vital signs reviewed and stable Respiratory status: spontaneous breathing and nonlabored ventilation Cardiovascular status: stable Postop Assessment: no headache, no backache, patient able to bend at knees, no signs of nausea or vomiting and adequate PO intake Anesthetic complications: no        Last Vitals:  Vitals:   01/08/17 1100 01/08/17 1300  BP: 120/69   Pulse: 88   Resp: 20 20  Temp: 36.8 C     Last Pain:  Vitals:   01/08/17 1300  TempSrc:   PainSc: 0-No pain   Pain Goal: Patients Stated Pain Goal: 3 (01/07/17 0245)               Willa Rough

## 2017-01-08 NOTE — Progress Notes (Signed)
Patient ID: Tammy Chapman, female   DOB: 1991-01-15, 27 y.o.   MRN: [id number]  MVUs adequate since approx 0150, with Pit @ 26mu/min. FHR 120s, +accels, no decels. Cx unchanged (more like 5+/70/-2). Plan is to recheck at 0430 and if remains unchanged, will rec a C/S most likely. Pt and FOB agreeable.  Serita Grammes CNM 01/08/2017 2:59 AM

## 2017-01-08 NOTE — Progress Notes (Signed)
Labor Progress Note  Tammy Chapman is a 26 y.o. G1P0 at [redacted]w[redacted]d  admitted for induction of labor due to Hypertension.  S: Reporting painful contractions primarily felt in lower back. Also reporting pelvic pressure.   O:  BP 131/81   Pulse 74   Temp 98.2 F (36.8 C) (Oral)   Resp 16   Ht 5\' 10"  (1.778 m)   Wt 250 lb (113.4 kg)   LMP 04/05/2016 (Exact Date)   SpO2 99%   BMI 35.87 kg/m   No intake/output data recorded.  FHT:  FHR: 120 bpm, variability: minimal ,  accelerations:  Present,  decelerations:  Absent UC:   regular, every 2 minutes SVE:   Dilation: 6.5 Effacement (%): 70 Station: -2 Exam by:: Wallis Mart, RN SROM/AROM: AROM clear 1245  Pitocin @ 24 mu/min  Labs: Lab Results  Component Value Date   WBC 11.0 (H) 01/07/2017   HGB 11.2 (L) 01/07/2017   HCT 34.6 (L) 01/07/2017   MCV 79.7 01/07/2017   PLT 165 01/07/2017    Assessment / Plan: 26 y.o. G1P0 [redacted]w[redacted]d presenting for IOL for gestational HTN in active labor. Induction of labor due to gestational hypertension,  progressing well on pitocin.   Labor: Progressing on pitocin. Continue increasing pit dose q30 min to achieve adequate contractions.  Fetal Wellbeing:  Category I Pain Control:  Epidural Anticipated MOD:  NSVD  Expectant management   Adin Hector, MD, MPH PGY-2 [facility]

## 2017-01-08 NOTE — Lactation Note (Signed)
This note was copied from a baby's chart. Lactation Consultation Note  Patient Name: Tammy Chapman Date: 01/08/2017 Reason for consult: Initial assessment Mom reports baby has latched to left breast but not right breast so far. Baby 5 hours old. Basic teaching reviewed with Mom, encouraged to BF with feeding ques. Lactation brochure left for review, advised of OP services and support group. Mom to call with next feeding for assist with latch on right breast.   Maternal Data Has patient been taught Hand Expression?: Yes Does the patient have breastfeeding experience prior to this delivery?: No  Feeding Feeding Type: Breast Fed Length of feed: 10 min (mothe unusre about time and minutes, she gave an estimate)  LATCH Score/Interventions                      Lactation Tools Discussed/Used     Consult Status Consult Status: Follow-up Date: 01/09/17 Follow-up type: In-patient    Katrine Coho 01/08/2017, 11:11 AM

## 2017-01-08 NOTE — Progress Notes (Signed)
ANTIBIOTIC CONSULT NOTE - INITIAL  Pharmacy Consult for Gentamicin Indication: Chorioamnionitis/ surgical prophylaxis  Allergies  Allergen Reactions  . Benadryl [Diphenhydramine Hcl] Shortness Of Breath    Pt states that she feels fine if she sits down, but if she is moving around after taking benadryl, she feels SOB  . Penicillins Shortness Of Breath    Has patient had a PCN reaction causing immediate rash, facial/tongue/throat swelling, SOB or lightheadedness with hypotension: yes Has patient had a PCN reaction causing severe rash involving mucus membranes or skin necrosis: no Has patient had a PCN reaction that required hospitalization: no Has patient had a PCN reaction occurring within the last 10 years: no If all of the above answers are "NO", then may proceed with Cephalosporin use.   . Adhesive [Tape] Hives  . Lactose Intolerance (Gi) Diarrhea and Nausea And Vomiting  . Latex Rash  . Sulfa Antibiotics Rash  . Ultram [Tramadol Hcl] Rash    Patient Measurements: Height: 5\' 10"  (177.8 cm) Weight: 250 lb (113.4 kg) IBW/kg (Calculated) : 68.5 Adjusted Body Weight: 86 kg  Vital Signs: Temp: 98 F (36.7 C) (05/24 0401) Temp Source: Oral (05/24 0401) BP: 127/80 (05/24 0511) Pulse Rate: 82 (05/24 0511)  Labs:  Recent Labs  01/06/17 1805 01/06/17 2320 01/07/17 1657  WBC 9.8  --  11.0*  HGB 11.5*  --  11.2*  PLT 189  --  165  LABCREA  --  41.00  --   CREATININE 0.68  --   --    No results for input(s): GENTTROUGH, GENTPEAK, GENTRANDOM in the last 72 hours.   Microbiology: Recent Results (from the past 720 hour(s))  Urine culture     Status: None   Collection Time: 12/12/16  1:30 PM  Result Value Ref Range Status   Urine Culture, Routine Final report  Final   Urine Culture result 1 Comment  Final    Comment: Mixed urogenital flora Less than 10,000 colonies/mL   GC/Chlamydia Probe Amp     Status: None   Collection Time: 12/19/16  2:00 PM  Result Value Ref  Range Status   Chlamydia trachomatis, NAA Negative Negative Final   Neisseria gonorrhoeae by PCR Negative Negative Final  Strep Gp B NAA+Rflx     Status: None   Collection Time: 12/19/16  2:00 PM  Result Value Ref Range Status   Strep Gp B NAA+Rflx Negative Negative Final    Comment: Centers for Disease Control and Prevention (CDC) and American Congress of Obstetricians and Gynecologists (ACOG) guidelines for prevention of perinatal group B streptococcal (GBS) disease specify co-collection of a vaginal and rectal swab specimen to maximize sensitivity of GBS detection. Per the CDC and ACOG, swabbing both the lower vagina and rectum substantially increases the yield of detection compared with sampling the vagina alone. Penicillin G, ampicillin, or cefazolin are indicated for intrapartum prophylaxis of perinatal GBS colonization. Reflex susceptibility testing should be performed prior to use of clindamycin only on GBS isolates from penicillin-allergic women who are considered a high risk for anaphylaxis. Treatment with vancomycin without additional testing is warranted if resistance to clindamycin is noted.   OB RESULT CONSOLE Group B Strep     Status: None   Collection Time: 12/21/16 12:00 AM  Result Value Ref Range Status   GBS Negative  Final    Medications:  Routine labor meds  Assessment: 26 y.o. female G1P0 at [redacted]w[redacted]d going to c/s due to arrest of descent.    Plan:  Gentamicin 5mg /kg  adjusted weight, clindamycin 900mg   Check Scr with next labs if gentamicin continued. Will check gentamicin levels if continued > 72hr or clinically indicated.  Tammy Chapman 01/08/2017,5:37 AM

## 2017-01-08 NOTE — Anesthesia Postprocedure Evaluation (Signed)
Anesthesia Post Note  Patient: Tammy Chapman  Procedure(s) Performed: Procedure(s) (LRB): CESAREAN SECTION (N/A)  Patient location during evaluation: PACU Anesthesia Type: Epidural Level of consciousness: awake and alert, oriented and patient cooperative Pain management: pain level controlled Vital Signs Assessment: post-procedure vital signs reviewed and stable Respiratory status: spontaneous breathing, nonlabored ventilation and respiratory function stable Cardiovascular status: blood pressure returned to baseline and stable Postop Assessment: epidural receding and patient able to bend at knees Anesthetic complications: no        Last Vitals:  Vitals:   01/08/17 0655 01/08/17 0700  BP:  132/89  Pulse: 73 84  Resp: 12 (!) 23  Temp:      Last Pain:  Vitals:   01/08/17 0700  TempSrc:   PainSc: 0-No pain   Pain Goal: Patients Stated Pain Goal: 3 (01/07/17 0245)               Seleta Rhymes. Thang Flett

## 2017-01-08 NOTE — Op Note (Signed)
01/06/2017 - 01/08/2017  6:20 AM  PATIENT:  Tammy Chapman  26 y.o. female  PRE-OPERATIVE DIAGNOSIS:  Failure to Progress, failed induction of labor for gestational hypertension pregnancy 39 weeks 5 days  POST-OPERATIVE DIAGNOSIS:  Failure to Progress  PROCEDURE:  Procedure(s): CESAREAN SECTION (N/A) primary low transverse cervical  SURGEON:  Surgeon(s) and Role:    * Jonnie Kind, MD - Primary  PHYSICIAN ASSISTANT:   ASSISTANTS: none   ANESTHESIA:   epidural  EBL:  Total I/O In: -  Out: 1400 [Urine:400; Blood:1000]  BLOOD ADMINISTERED:none  DRAINS: Urinary Catheter (Foley)   LOCAL MEDICATIONS USED:  NONE  SPECIMEN:  No Specimen  DISPOSITION OF SPECIMEN:  Placenta to labor and delivery  COUNTS:  YES  TOURNIQUET:  * No tourniquets in log *  DICTATION: .Dragon Dictation  PLAN OF CARE: Patient has active admission orders  PATIENT DISPOSITION:  PACU - hemodynamically stable.   Delay start of Pharmacological VTE agent (>24hrs) due to surgical blood loss or risk of bleeding: not applicable Indications: This 26 year old female was admitted 2 days ago for induction of labor due to gestational hypertension, reached 5 cm dilated with Foley balloon cervical ripening, then had essentially arrest of labor never progressing past 5 cm over 12 hours with presenting part showing no descent with active contractions. Details of procedure: Patient was taken operating room prepped and draped for lower abdominal surgery. Timeout was conducted and confirmed by surgical team. Gentamicin and Cleocin were administered as antibiotic surgical prophylaxis. Transverse lower abdominal incision was performed through a 15 cm incision transversely located, with sharp dissection to the fascia which was opened transversely and peritoneal cavity entered without difficulty. Lower uterine segment was rather thin bladder flap was developed and transverse uterine incision performed and opened transversely  and then cephalad and caudad with fetal vertex rotated into the incision and then delivered with fundal pressure. He was a large female infant Apgars 9 and 9 please see pediatrician's notes for details. The cord clamp slipped off briefly for less than 15 seconds as the baby was being transferred to the pediatricians. Nesacaine 30 mL's was placed in the abdominal cavity prior to the delivery of the infant due to the patient's discomfort when contacted from peritoneal contact The placenta delivered in response to her day uterine massage with a rather large amount of blood that had collected behind the placenta. This increased the blood loss approximately thousand cc with risks of the procedure was uneventful in terms of uterine tone and bleeding. Blood loss was measured thousand cc and the laparotomy tapes were relatively clean. Uterus was swabbed out closed with running locking 0 Monocryl continuous closure first layer followed by continuous running second layer closure with good hemostasis and tissue approximation. Abdomen was irrigated out, and anterior peritoneum closed running 2-0 Vicryl, then the fascia closed with continuous running 0 Vicryl, then subcutaneous tissues reapproximated using horizontal mattress sutures of 20 plain followed by subcuticular 4-0 Vicryl closure the skin sponge and needle counts were correct estimated blood loss 1000 cc

## 2017-01-08 NOTE — Brief Op Note (Signed)
01/06/2017 - 01/08/2017  6:20 AM  PATIENT:  Tammy Chapman  26 y.o. female  PRE-OPERATIVE DIAGNOSIS:  Failure to Progress, failed induction of labor for gestational hypertension pregnancy 39 weeks 5 days  POST-OPERATIVE DIAGNOSIS:  Failure to Progress  PROCEDURE:  Procedure(s): CESAREAN SECTION (N/A) primary low transverse cervical  SURGEON:  Surgeon(s) and Role:    * Jonnie Kind, MD - Primary  PHYSICIAN ASSISTANT:   ASSISTANTS: none   ANESTHESIA:   epidural  EBL:  Total I/O In: -  Out: 1400 [Urine:400; Blood:1000]  BLOOD ADMINISTERED:none  DRAINS: Urinary Catheter (Foley)   LOCAL MEDICATIONS USED:  NONE  SPECIMEN:  No Specimen  DISPOSITION OF SPECIMEN:  Placenta to labor and delivery  COUNTS:  YES  TOURNIQUET:  * No tourniquets in log *  DICTATION: .Dragon Dictation  PLAN OF CARE: Patient has active admission orders  PATIENT DISPOSITION:  PACU - hemodynamically stable.   Delay start of Pharmacological VTE agent (>24hrs) due to surgical blood loss or risk of bleeding: not applicable

## 2017-01-08 NOTE — Progress Notes (Signed)
Tammy Chapman is a 26 y.o. G1P0 at [redacted]w[redacted]d  admitted for induction of labor due to Hypertension.  Subjective:   Objective: BP 127/80   Pulse 82   Temp 98 F (36.7 C) (Oral)   Resp 16   Ht 5\' 10"  (1.778 m)   Wt 113.4 kg (250 lb)   LMP 04/05/2016 (Exact Date)   SpO2 99%   BMI 35.87 kg/m  No intake/output data recorded. No intake/output data recorded.  FHT:  FHR: 140 bpm, variability: moderate,  accelerations:  Present,  decelerations:  Absent   UC:   regular, every 5 minutes SVE:   Dilation: 5.5 Effacement (%): 70 Station: -2 Exam by:: Brigitte Pulse, CNM confimred by  Me as unchanged, with no descent with contractions. Labs: Lab Results  Component Value Date   WBC 11.0 (H) 01/07/2017   HGB 11.2 (L) 01/07/2017   HCT 34.6 (L) 01/07/2017   MCV 79.7 01/07/2017   PLT 165 01/07/2017    Assessment / Plan: Induction of labor due to gestational hypertension,  progressing well on pitocin  Labor: arrest of labor Preeclampsia:   Fetal Wellbeing:  Category I Pain Control:  Epidural I/D:  n/a Anticipated MOD:  arrest of labor, cesarean reviewed , with R/B /A, and accepted. to OR.  Wasil Wolke V 01/08/2017, 5:27 AM

## 2017-01-09 ENCOUNTER — Encounter (HOSPITAL_COMMUNITY): Payer: Self-pay | Admitting: *Deleted

## 2017-01-09 ENCOUNTER — Other Ambulatory Visit: Payer: 59 | Admitting: Obstetrics & Gynecology

## 2017-01-09 LAB — CBC
HCT: 21.9 % — ABNORMAL LOW (ref 36.0–46.0)
Hemoglobin: 7.1 g/dL — ABNORMAL LOW (ref 12.0–15.0)
MCH: 25.8 pg — ABNORMAL LOW (ref 26.0–34.0)
MCHC: 32.4 g/dL (ref 30.0–36.0)
MCV: 79.6 fL (ref 78.0–100.0)
Platelets: 138 10*3/uL — ABNORMAL LOW (ref 150–400)
RBC: 2.75 MIL/uL — ABNORMAL LOW (ref 3.87–5.11)
RDW: 14.1 % (ref 11.5–15.5)
WBC: 12.9 10*3/uL — ABNORMAL HIGH (ref 4.0–10.5)

## 2017-01-09 LAB — BIRTH TISSUE RECOVERY COLLECTION (PLACENTA DONATION)

## 2017-01-09 NOTE — Progress Notes (Signed)
Post OP DAY #1 Subjective: no complaints, up ad lib, voiding, tolerating PO and reports normal lochia  Objective: Blood pressure (!) 108/58, pulse 72, temperature 97.7 F (36.5 C), temperature source Oral, resp. rate 19, height 5\' 10"  (1.778 m), weight 113.4 kg (250 lb), last menstrual period 04/05/2016, SpO2 100 %, unknown if currently breastfeeding.  Physical Exam:  General: alert Lochia: appropriate Uterine Fundus: firm Incision: dressing: c/d/i DVT Evaluation: No evidence of DVT seen on physical exam.   Recent Labs  01/07/17 1657 01/09/17 0528  HGB 11.2* 7.1*  HCT 34.6* 21.9*    Assessment/Plan: Continue current care Follow hgb   LOS: 3 days   Tammy Chapman C Tammy Chapman 01/09/2017, 7:29 AM

## 2017-01-10 ENCOUNTER — Ambulatory Visit: Payer: Self-pay

## 2017-01-10 LAB — CBC
HCT: 23.6 % — ABNORMAL LOW (ref 36.0–46.0)
Hemoglobin: 7.7 g/dL — ABNORMAL LOW (ref 12.0–15.0)
MCH: 26.1 pg (ref 26.0–34.0)
MCHC: 32.6 g/dL (ref 30.0–36.0)
MCV: 80 fL (ref 78.0–100.0)
Platelets: 162 10*3/uL (ref 150–400)
RBC: 2.95 MIL/uL — ABNORMAL LOW (ref 3.87–5.11)
RDW: 14.3 % (ref 11.5–15.5)
WBC: 11.6 10*3/uL — ABNORMAL HIGH (ref 4.0–10.5)

## 2017-01-10 MED ORDER — IBUPROFEN 600 MG PO TABS: 600.0000 mg | ORAL_TABLET | Freq: Four times a day (QID) | ORAL | 0 refills | Status: DC | PRN

## 2017-01-10 NOTE — Discharge Summary (Signed)
Obstetric Discharge Summary Reason for Admission: induction of labor  Prenatal Procedures: none Intrapartum Procedures: cesarean: low cervical, transverse Postpartum Procedures: none Complications-Operative and Postpartum: none  Hospital Course:  Active Problems:   Normal labor   Failure to progress in labor, delivered cesarean, current hospitalization   Tammy Chapman is a 26 y.o. G1P1001 s/p LTCS for failure to progress in labor.  Patient was admitted for IOL after elevated BP noted in clinic.  She has postpartum course that was uncomplicated including no problems with ambulating, PO intake, urination, pain, or bleeding. The pt feels ready to go home and  will be discharged with outpatient follow-up.   Today: No acute events overnight.  Pt denies problems with ambulating, voiding or po intake.  She denies nausea or vomiting.  Pain is well controlled.  She has had flatus. She has had bowel movement.  Lochia Moderate.  Plan for birth control is  denies contraception.  Method of Feeding: Breast  Physical Exam:  General: alert, cooperative and no distress Lochia: appropriate Uterine Fundus: firm Incision: healing well, no significant drainage, no dehiscence, no significant erythema DVT Evaluation: No evidence of DVT seen on physical exam.  H/H: Lab Results  Component Value Date/Time   HGB 7.7 (L) 01/10/2017 05:19 AM   HCT 23.6 (L) 01/10/2017 05:19 AM   HCT 35.9 10/24/2016 09:17 AM    Discharge Diagnoses: Term Pregnancy-delivered  Discharge Information: Date: 01/10/2017 Activity: pelvic rest Diet: routine  Medications: PNV and Ibuprofen Breast feeding:  Yes Condition: stable Instructions: refer to handout Discharge to: home   Discharge Instructions    Call MD for:  difficulty breathing, headache or visual disturbances    Complete by:  As directed    Call MD for:  extreme fatigue    Complete by:  As directed    Call MD for:  hives    Complete by:  As directed    Call  MD for:  persistant dizziness or light-headedness    Complete by:  As directed    Call MD for:  persistant nausea and vomiting    Complete by:  As directed    Call MD for:  redness, tenderness, or signs of infection (pain, swelling, redness, odor or green/yellow discharge around incision site)    Complete by:  As directed    Call MD for:  severe uncontrolled pain    Complete by:  As directed    Call MD for:  temperature >100.4    Complete by:  As directed    Driving restriction     Complete by:  As directed    Avoid driving for at least 2 weeks.   Lifting restrictions    Complete by:  As directed    Weight restriction of 10 lbs.   Sexual acrtivity    Complete by:  As directed    Pelvic rest (no sex or tampons) for at least six weeks        Adin Hector ,MD 01/10/2017,7:22 AM

## 2017-01-10 NOTE — Discharge Instructions (Signed)
Cesarean Delivery, Care After Refer to this sheet in the next few weeks. These instructions provide you with information about caring for yourself after your procedure. Your health care provider may also give you more specific instructions. Your treatment has been planned according to current medical practices, but problems sometimes occur. Call your health care provider if you have any problems or questions after your procedure. What can I expect after the procedure? After the procedure, it is common to have:  A small amount of blood or clear fluid coming from the incision.  Some redness, swelling, and pain in your incision area.  Some abdominal pain and soreness.  Vaginal bleeding (lochia).  Pelvic cramps.  Fatigue. Follow these instructions at home: Incision care    Follow instructions from your health care provider about how to take care of your incision. Make sure you:  Wash your hands with soap and water before you change your bandage (dressing). If soap and water are not available, use hand sanitizer.  Change your dressing as told by your health care provider.  Leave stitches (sutures), skin staples, skin glue, or adhesive strips in place. These skin closures may need to stay in place for 2 weeks or longer. If adhesive strip edges start to loosen and curl up, you may trim the loose edges. Do not remove adhesive strips completely unless your health care provider tells you to do that.  Check your incision area every day for signs of infection. Check for:  More redness, swelling, or pain.  More fluid or blood.  Warmth.  Pus or a bad smell.  When you cough or sneeze, hug a pillow. This helps with pain and decreases the chance of your incision opening up (dehiscing). Do this until your incision heals. Medicines   Take over-the-counter and prescription medicines only as told by your health care provider.  If you were prescribed an antibiotic medicine, take it as told by  your health care provider. Do not stop taking the antibiotic until it is finished. Driving   Do not drive or operate heavy machinery while taking prescription pain medicine.  Do not drive for 24 hours if you received a sedative. Lifestyle   Do not drink alcohol. This is especially important if you are breastfeeding or taking pain medicine.  Do not use tobacco products, including cigarettes, chewing tobacco, or e-cigarettes. If you need help quitting, ask your health care provider. Tobacco can delay wound healing. Eating and drinking   Drink at least 8 eight-ounce glasses of water every day unless told not to by your health care provider. If you breastfeed, you may need to drink more water than this.  Eat high-fiber foods every day. These foods may help prevent or relieve constipation. High-fiber foods include:  Whole grain cereals and breads.  Brown rice.  Beans.  Fresh fruits and vegetables. Activity   Return to your normal activities as told by your health care provider. Ask your health care provider what activities are safe for you.  Rest as much as possible. Try to rest or take a nap while your baby is sleeping.  Do not lift anything that is heavier than your baby or 10 lb (4.5 kg) as told by your health care provider.  Ask your health care provider when you can engage in sexual activity. This may depend on your:  Risk of infection.  Healing rate.  Comfort and desire to engage in sexual activity. Bathing   Do not take baths, swim, or use a  hot tub until your health care provider approves. Ask your health care provider if you can take showers. You may only be allowed to take sponge baths until your incision heals.  Keep your dressing dry as told by your health care provider. General instructions   Do not use tampons or douches until your health care provider approves.  Wear:  Loose, comfortable clothing.  A supportive and well-fitting bra.  Watch for any blood  clots that may pass from your vagina. These may look like clumps of dark red, brown, or black discharge.  Keep your perineum clean and dry as told by your health care provider.  Wipe from front to back when you use the toilet.  If possible, have someone help you care for your baby and help with household activities for a few days after you leave the hospital.  Keep all follow-up visits for you and your baby as told by your health care provider. This is important. Contact a health care provider if:  You have:  Bad-smelling vaginal discharge.  Difficulty urinating.  Pain when urinating.  A sudden increase or decrease in the frequency of your bowel movements.  More redness, swelling, or pain around your incision.  More fluid or blood coming from your incision.  Pus or a bad smell coming from your incision.  A fever.  A rash.  Little or no interest in activities you used to enjoy.  Questions about caring for yourself or your baby.  Nausea.  Your incision feels warm to the touch.  Your breasts turn red or become painful or hard.  You feel unusually sad or worried.  You vomit.  You pass large blood clots from your vagina. If you pass a blood clot, save it to show to your health care provider. Do not flush blood clots down the toilet without showing your health care provider.  You urinate more than usual.  You are dizzy or light-headed.  You have not breastfed and have not had a menstrual period for 12 weeks after delivery.  You stopped breastfeeding and have not had a menstrual period for 12 weeks after stopping breastfeeding. Get help right away if:  You have:  Pain that does not go away or get better with medicine.  Chest pain.  Difficulty breathing.  Blurred vision or spots in your vision.  Thoughts about hurting yourself or your baby.  New pain in your abdomen or in one of your legs.  A severe headache.  You faint.  You bleed from your vagina so  much that you fill two sanitary pads in one hour. This information is not intended to replace advice given to you by your health care provider. Make sure you discuss any questions you have with your health care provider. Document Released: 04/26/2002 Document Revised: 12/13/2015 Document Reviewed: 07/09/2015 Elsevier Interactive Patient Education  2017 [employer].   Breastfeeding Deciding to breastfeed is one of the best choices you can make for you and your baby. A change in hormones during pregnancy causes your breast tissue to grow and increases the number and size of your milk ducts. These hormones also allow proteins, sugars, and fats from your blood supply to make breast milk in your milk-producing glands. Hormones prevent breast milk from being released before your baby is born as well as prompt milk flow after birth. Once breastfeeding has begun, thoughts of your baby, as well as his or her sucking or crying, can stimulate the release of milk from your milk-producing  glands. Benefits of breastfeeding For Your Baby  Your first milk (colostrum) helps your baby's digestive system function better.  There are antibodies in your milk that help your baby fight off infections.  Your baby has a lower incidence of asthma, allergies, and sudden infant death syndrome.  The nutrients in breast milk are better for your baby than infant formulas and are designed uniquely for your babys needs.  Breast milk improves your baby's brain development.  Your baby is less likely to develop other conditions, such as childhood obesity, asthma, or type 2 diabetes mellitus. For You  Breastfeeding helps to create a very special bond between you and your baby.  Breastfeeding is convenient. Breast milk is always available at the correct temperature and costs nothing.  Breastfeeding helps to burn calories and helps you lose the weight gained during pregnancy.  Breastfeeding makes your uterus contract to  its prepregnancy size faster and slows bleeding (lochia) after you give birth.  Breastfeeding helps to lower your risk of developing type 2 diabetes mellitus, osteoporosis, and breast or ovarian cancer later in life. Signs that your baby is hungry Early Signs of Hunger  Increased alertness or activity.  Stretching.  Movement of the head from side to side.  Movement of the head and opening of the mouth when the corner of the mouth or cheek is stroked (rooting).  Increased sucking sounds, smacking lips, cooing, sighing, or squeaking.  Hand-to-mouth movements.  Increased sucking of fingers or hands. Late Signs of Hunger  Fussing.  Intermittent crying. Extreme Signs of Hunger  Signs of extreme hunger will require calming and consoling before your baby will be able to breastfeed successfully. Do not wait for the following signs of extreme hunger to occur before you initiate breastfeeding:  Restlessness.  A loud, strong cry.  Screaming. Breastfeeding basics  Breastfeeding Initiation  Find a comfortable place to sit or lie down, with your neck and back well supported.  Place a pillow or rolled up blanket under your baby to bring him or her to the level of your breast (if you are seated). Nursing pillows are specially designed to help support your arms and your baby while you breastfeed.  Make sure that your baby's abdomen is facing your abdomen.  Gently massage your breast. With your fingertips, massage from your chest wall toward your nipple in a circular motion. This encourages milk flow. You may need to continue this action during the feeding if your milk flows slowly.  Support your breast with 4 fingers underneath and your thumb above your nipple. Make sure your fingers are well away from your nipple and your babys mouth.  Stroke your baby's lips gently with your finger or nipple.  When your baby's mouth is open wide enough, quickly bring your baby to your breast, placing  your entire nipple and as much of the colored area around your nipple (areola) as possible into your baby's mouth.  More areola should be visible above your baby's upper lip than below the lower lip.  Your baby's tongue should be between his or her lower gum and your breast.  Ensure that your baby's mouth is correctly positioned around your nipple (latched). Your baby's lips should create a seal on your breast and be turned out (everted).  It is common for your baby to suck about 2-3 minutes in order to start the flow of breast milk. Latching  Teaching your baby how to latch on to your breast properly is very important. An improper  latch can cause nipple pain and decreased milk supply for you and poor weight gain in your baby. Also, if your baby is not latched onto your nipple properly, he or she may swallow some air during feeding. This can make your baby fussy. Burping your baby when you switch breasts during the feeding can help to get rid of the air. However, teaching your baby to latch on properly is still the best way to prevent fussiness from swallowing air while breastfeeding. Signs that your baby has successfully latched on to your nipple:  Silent tugging or silent sucking, without causing you pain.  Swallowing heard between every 3-4 sucks.  Muscle movement above and in front of his or her ears while sucking. Signs that your baby has not successfully latched on to nipple:  Sucking sounds or smacking sounds from your baby while breastfeeding.  Nipple pain. If you think your baby has not latched on correctly, slip your finger into the corner of your babys mouth to break the suction and place it between your baby's gums. Attempt breastfeeding initiation again. Signs of Successful Breastfeeding  Signs from your baby:  A gradual decrease in the number of sucks or complete cessation of sucking.  Falling asleep.  Relaxation of his or her body.  Retention of a small amount of milk  in his or her mouth.  Letting go of your breast by himself or herself. Signs from you:  Breasts that have increased in firmness, weight, and size 1-3 hours after feeding.  Breasts that are softer immediately after breastfeeding.  Increased milk volume, as well as a change in milk consistency and color by the fifth day of breastfeeding.  Nipples that are not sore, cracked, or bleeding. Signs That Your [employer] is Getting Enough Milk  Wetting at least 1-2 diapers during the first 24 hours after birth.  Wetting at least 5-6 diapers every 24 hours for the first week after birth. The urine should be clear or pale yellow by 5 days after birth.  Wetting 6-8 diapers every 24 hours as your baby continues to grow and develop.  At least 3 stools in a 24-hour period by age 33 days. The stool should be soft and yellow.  At least 3 stools in a 24-hour period by age 63 days. The stool should be seedy and yellow.  No loss of weight greater than 10% of birth weight during the first 97 days of age.  Average weight gain of 4-7 ounces (113-198 g) per week after age 35 days.  Consistent daily weight gain by age 335 days, without weight loss after the age of 2 weeks. After a feeding, your baby may spit up a small amount. This is common. Breastfeeding frequency and duration Frequent feeding will help you make more milk and can prevent sore nipples and breast engorgement. Breastfeed when you feel the need to reduce the fullness of your breasts or when your baby shows signs of hunger. This is called "breastfeeding on demand." Avoid introducing a pacifier to your baby while you are working to establish breastfeeding (the first 4-6 weeks after your baby is born). After this time you may choose to use a pacifier. Research has shown that pacifier use during the first year of a baby's life decreases the risk of sudden infant death syndrome (SIDS). Allow your baby to feed on each breast as long as he or she wants. Breastfeed  until your baby is finished feeding. When your baby unlatches or falls asleep while feeding  from the first breast, offer the second breast. Because newborns are often sleepy in the first few weeks of life, you may need to awaken your baby to get him or her to feed. Breastfeeding times will vary from baby to baby. However, the following rules can serve as a guide to help you ensure that your baby is properly fed:  Newborns (babies 30 weeks of age or younger) may breastfeed every 1-3 hours.  Newborns should not go longer than 3 hours during the day or 5 hours during the night without breastfeeding.  You should breastfeed your baby a minimum of 8 times in a 24-hour period until you begin to introduce solid foods to your baby at around 30 months of age. Breast milk pumping Pumping and storing breast milk allows you to ensure that your baby is exclusively fed your breast milk, even at times when you are unable to breastfeed. This is especially important if you are going back to work while you are still breastfeeding or when you are not able to be present during feedings. Your lactation consultant can give you guidelines on how long it is safe to store breast milk. A breast pump is a machine that allows you to pump milk from your breast into a sterile bottle. The pumped breast milk can then be stored in a refrigerator or freezer. Some breast pumps are operated by hand, while others use electricity. Ask your lactation consultant which type will work best for you. Breast pumps can be purchased, but some hospitals and breastfeeding support groups lease breast pumps on a monthly basis. A lactation consultant can teach you how to hand express breast milk, if you prefer not to use a pump. Caring for your breasts while you breastfeed Nipples can become dry, cracked, and sore while breastfeeding. The following recommendations can help keep your breasts moisturized and healthy:  Avoid using soap on your  nipples.  Wear a supportive bra. Although not required, special nursing bras and tank tops are designed to allow access to your breasts for breastfeeding without taking off your entire bra or top. Avoid wearing underwire-style bras or extremely tight bras.  Air dry your nipples for 3-62minutes after each feeding.  Use only cotton bra pads to absorb leaked breast milk. Leaking of breast milk between feedings is normal.  Use lanolin on your nipples after breastfeeding. Lanolin helps to maintain your skin's normal moisture barrier. If you use pure lanolin, you do not need to wash it off before feeding your baby again. Pure lanolin is not toxic to your baby. You may also hand express a few drops of breast milk and gently massage that milk into your nipples and allow the milk to air dry. In the first few weeks after giving birth, some women experience extremely full breasts (engorgement). Engorgement can make your breasts feel heavy, warm, and tender to the touch. Engorgement peaks within 3-5 days after you give birth. The following recommendations can help ease engorgement:  Completely empty your breasts while breastfeeding or pumping. You may want to start by applying warm, moist heat (in the shower or with warm water-soaked hand towels) just before feeding or pumping. This increases circulation and helps the milk flow. If your baby does not completely empty your breasts while breastfeeding, pump any extra milk after he or she is finished.  Wear a snug bra (nursing or regular) or tank top for 1-2 days to signal your body to slightly decrease milk production.  Apply ice packs to  your breasts, unless this is too uncomfortable for you.  Make sure that your baby is latched on and positioned properly while breastfeeding. If engorgement persists after 48 hours of following these recommendations, contact your health care provider or a Science writer. Overall health care recommendations while  breastfeeding  Eat healthy foods. Alternate between meals and snacks, eating 3 of each per day. Because what you eat affects your breast milk, some of the foods may make your baby more irritable than usual. Avoid eating these foods if you are sure that they are negatively affecting your baby.  Drink milk, fruit juice, and water to satisfy your thirst (about 10 glasses a day).  Rest often, relax, and continue to take your prenatal vitamins to prevent fatigue, stress, and anemia.  Continue breast self-awareness checks.  Avoid chewing and smoking tobacco. Chemicals from cigarettes that pass into breast milk and exposure to secondhand smoke may harm your baby.  Avoid alcohol and drug use, including marijuana. Some medicines that may be harmful to your baby can pass through breast milk. It is important to ask your health care provider before taking any medicine, including all over-the-counter and prescription medicine as well as vitamin and herbal supplements. It is possible to become pregnant while breastfeeding. If birth control is desired, ask your health care provider about options that will be safe for your baby. Contact a health care provider if:  You feel like you want to stop breastfeeding or have become frustrated with breastfeeding.  You have painful breasts or nipples.  Your nipples are cracked or bleeding.  Your breasts are red, tender, or warm.  You have a swollen area on either breast.  You have a fever or chills.  You have nausea or vomiting.  You have drainage other than breast milk from your nipples.  Your breasts do not become full before feedings by the fifth day after you give birth.  You feel sad and depressed.  Your baby is too sleepy to eat well.  Your baby is having trouble sleeping.  Your baby is wetting less than 3 diapers in a 24-hour period.  Your baby has less than 3 stools in a 24-hour period.  Your baby's skin or the white part of his or her eyes  becomes yellow.  Your baby is not gaining weight by 96 days of age. Get help right away if:  Your baby is overly tired (lethargic) and does not want to wake up and feed.  Your baby develops an unexplained fever. This information is not intended to replace advice given to you by your health care provider. Make sure you discuss any questions you have with your health care provider. Document Released: 08/04/2005 Document Revised: 01/16/2016 Document Reviewed: 01/26/2013 Elsevier Interactive Patient Education  2017 [employer].

## 2017-01-10 NOTE — Lactation Note (Signed)
This note was copied from a baby's chart. Lactation Consultation Note  Patient Name: Tammy Chapman [id number] Date: 01/10/2017 Reason for consult: Follow-up assessment;Hyperbilirubinemia   Follow up with mom of 59 hour old infant at request of Lennie Muckle RN. Mom is not planning to BF, she was given information in using ice, cabbage leaves, and supportive bra for drying up milk. Mom reports she has no questions/concerns.    Maternal Data Formula Feeding for Exclusion: Yes  Feeding Feeding Type: Bottle Fed - Formula  LATCH Score/Interventions                      Lactation Tools Discussed/Used     Consult Status Consult Status: Complete Follow-up type: Call as needed    Donn Pierini 01/10/2017, 10:29 PM

## 2017-01-10 NOTE — Discharge Summary (Signed)
OB Discharge Summary     Patient Name: Tammy Chapman DOB: [birth year] MRN: [id number]  Date of admission: 01/06/2017 Delivering MD: Jonnie Kind   Date of discharge: 01/10/2017  Admitting diagnosis: 39wks induction  Intrauterine pregnancy: [redacted]w[redacted]d     Secondary diagnosis:  Active Problems:   Normal labor   Failure to progress in labor, delivered cesarean, current hospitalization  Additional problems: IOL for GHTN     Discharge diagnosis: Term Pregnancy Delivered                                                                                                Post partum procedures:none  Augmentation: AROM, Pitocin, Cytotec and Foley Balloon  Complications: None  Hospital course:  Induction of Labor With Cesarean Section  26 y.o. yo G1P1001 at [redacted]w[redacted]d was admitted to the hospital 01/06/2017 for induction of labor. Patient had a labor course significant for IOL d/t GHTN. The patient went for cesarean section due to FTP @ 6.5/70/-2, and delivered a Viable infant,@BABYSUPPRESS (DBLINK,ept,110,,1,,) Membrane Rupture Time/Date: )12:50 PM ,01/07/2017   @Details  of operation can be found in separate operative Note.  Patient had an uncomplicated postpartum course. She is ambulating, tolerating a regular diet, passing flatus, and urinating well.  Patient is discharged home in stable condition on 01/10/17.                                    Physical exam  Vitals:   01/09/17 0200 01/09/17 0552 01/09/17 1820 01/10/17 0600  BP: (!) 116/59 (!) 108/58 (!) 126/54 133/69  Pulse: 76 72 90 (!) 108  Resp: 20 19 18 20   Temp: 97.6 F (36.4 C) 97.7 F (36.5 C) 98.5 F (36.9 C) 98.6 F (37 C)  TempSrc: Oral Oral Oral Oral  SpO2: 100% 100%    Weight:      Height:       General: alert, cooperative and no distress Lochia: appropriate Uterine Fundus: firm Incision: Healing well with no significant drainage, No significant erythema, Dressing is clean, dry, and intact DVT Evaluation: No evidence of  DVT seen on physical exam. Negative Homan's sign. No cords or calf tenderness. No significant calf/ankle edema. Labs: Lab Results  Component Value Date   WBC 11.6 (H) 01/10/2017   HGB 7.7 (L) 01/10/2017   HCT 23.6 (L) 01/10/2017   MCV 80.0 01/10/2017   PLT 162 01/10/2017   CMP Latest Ref Rng & Units 01/06/2017  Glucose 65 - 99 mg/dL 120(H)  BUN 6 - 20 mg/dL <5(L)  Creatinine 0.44 - 1.00 mg/dL 0.68  Sodium 135 - 145 mmol/L 130(L)  Potassium 3.5 - 5.1 mmol/L 3.8  Chloride 101 - 111 mmol/L 103  CO2 22 - 32 mmol/L 19(L)  Calcium 8.9 - 10.3 mg/dL 8.8(L)  Total Protein 6.5 - 8.1 g/dL 6.6  Total Bilirubin 0.3 - 1.2 mg/dL 0.6  Alkaline Phos 38 - 126 U/L 184(H)  AST 15 - 41 U/L 15  ALT 14 - 54 U/L 10(L)    Discharge instruction: per  After Visit Summary and "Baby and Me Booklet".  After visit meds:  Allergies as of 01/10/2017      Reactions   Benadryl [diphenhydramine Hcl] Shortness Of Breath   Pt states that she feels fine if she sits down, but if she is moving around after taking benadryl, she feels SOB   Penicillins Shortness Of Breath   Has patient had a PCN reaction causing immediate rash, facial/tongue/throat swelling, SOB or lightheadedness with hypotension: yes Has patient had a PCN reaction causing severe rash involving mucus membranes or skin necrosis: no Has patient had a PCN reaction that required hospitalization: no Has patient had a PCN reaction occurring within the last 10 years: no If all of the above answers are "NO", then may proceed with Cephalosporin use.   Adhesive [tape] Hives   Lactose Intolerance (gi) Diarrhea, Nausea And Vomiting   Latex Rash   Sulfa Antibiotics Rash   Ultram [tramadol Hcl] Rash      Medication List    STOP taking these medications   Doxylamine-Pyridoxine 10-10 MG Tbec   zolpidem 10 MG tablet Commonly known as:  AMBIEN     TAKE these medications   acetaminophen 325 MG tablet Commonly known as:  TYLENOL Take 650 mg by mouth  every 6 (six) hours as needed for moderate pain.   calcium carbonate 500 MG chewable tablet Commonly known as:  TUMS - dosed in mg elemental calcium Chew 2-3 tablets by mouth daily as needed for indigestion or heartburn.   Cefixime 400 MG Caps capsule Commonly known as:  SUPRAX Take 400 mg by mouth daily.   cetirizine 10 MG tablet Commonly known as:  ZYRTEC Take 10 mg by mouth daily.   ciprofloxacin-dexamethasone otic suspension Commonly known as:  CIPRODEX Place 4 drops into both ears 2 (two) times daily.   esomeprazole 20 MG capsule Commonly known as:  NEXIUM Take 20 mg by mouth daily as needed (heartburn).   fluticasone 50 MCG/ACT nasal spray Commonly known as:  FLONASE Place 2 sprays into both nostrils daily as needed for allergies.   ibuprofen 600 MG tablet Commonly known as:  ADVIL,MOTRIN Take 1 tablet (600 mg total) by mouth every 6 (six) hours as needed for mild pain, moderate pain or cramping.   PRENATAL VITAMINS PO Take 1 tablet by mouth daily.       Diet: routine diet  Activity: Advance as tolerated. Pelvic rest for 6 weeks.   Outpatient follow up:5/30 as scheduled for bp & incision check Follow up Appt:Future Appointments Date Time Provider [place]  01/14/2017 8:30 AM Jonnie Kind, MD [facility]  01/14/2017 8:45 AM Jonnie Kind, MD [facility]   Follow up Visit:No Follow-up on file.  Postpartum contraception: declines  Newborn Data: Live born female  Birth Weight: 8 lb 11.9 oz (3965 g) APGAR: 8, 9  Baby Feeding: Breast Disposition:home with mother   01/10/2017 Tawnya Crook, CNM

## 2017-01-10 NOTE — Lactation Note (Signed)
This note was copied from a baby's chart. Lactation Consultation Note Mom holding baby on her chest bouncing baby. Mom states baby has gas and screaming at times, that bouncing him is the only thing that is helping at this time. Mom didn't want to disturb baby or stop bouncing. Unable to assess mom's breast. appears mom may have wide space between breast.? Mom is breast/formula at this time. Mom states that the baby cries with breast or formula, passing a lot of gas. Mom stated baby hasn't taken more than 20 ml formula. Mom states burping baby. Mom states at times baby will not take the breast when starts screaming. Asked mom her feeding plan when goes home. Mom stated that what ever pleases the baby. Encouraged to have f/u w/LC after d/c if needed. Encouraged to call LC to see next latch or come help w/feeding. Mom states she is very tired, stated she was a very colicky baby, she thinks he may be as well. Encouraged to inform MD in am.  Patient Name: Boy Nilza Eaker [id number] Date: 01/10/2017 Reason for consult: Follow-up assessment   Maternal Data    Feeding    LATCH Score/Interventions                      Lactation Tools Discussed/Used     Consult Status Consult Status: Follow-up Date: 01/10/17 Follow-up type: In-patient    Theodoro Kalata 01/10/2017, 3:57 AM

## 2017-01-13 ENCOUNTER — Ambulatory Visit: Payer: 59 | Admitting: Obstetrics & Gynecology

## 2017-01-14 ENCOUNTER — Ambulatory Visit: Payer: 59 | Admitting: Obstetrics and Gynecology

## 2017-01-14 ENCOUNTER — Ambulatory Visit (INDEPENDENT_AMBULATORY_CARE_PROVIDER_SITE_OTHER): Payer: 59 | Admitting: Obstetrics and Gynecology

## 2017-01-14 ENCOUNTER — Encounter: Payer: Self-pay | Admitting: Obstetrics and Gynecology

## 2017-01-14 VITALS — BP 134/90 | HR 92 | Wt 231.0 lb

## 2017-01-14 DIAGNOSIS — O165 Unspecified maternal hypertension, complicating the puerperium: Secondary | ICD-10-CM

## 2017-01-14 DIAGNOSIS — Z9889 Other specified postprocedural states: Secondary | ICD-10-CM

## 2017-01-14 MED ORDER — HYDROCHLOROTHIAZIDE 25 MG PO TABS: 25.0000 mg | ORAL_TABLET | Freq: Every day | ORAL | 0 refills | Status: DC

## 2017-01-14 NOTE — Progress Notes (Signed)
Daily Post Partum Note- high risk  Tammy Chapman is a 26 y.o. G1P1001 PPD#6 s/p  Cesarean for FTP  @ 6.5 cm  Discharged on day 2.  Pregnancy c/o :      Subjective:  Denies headache blurry vision spots. She is noticed 1 large clot otherwise she is only noted a bit of chills in the evening but afebrile. There is no vaginal malodor  Objective:   Vitals:   01/14/17 0841  BP: 134/90  Pulse: 92  Weight: 231 lb (104.8 kg)   Pupils equal round reactive extraocular movements intact neck supple chest clear Abdomen nontender incision appears clean dressing remove Steri-Strips in place  extremities 2+ pedal edema   Current Vital Signs 24h Vital Sign Ranges  T   @FLOWSTAT (6:24)@  BP 134/90 @FLOWSTAT (5:24)@  HR 92 @FLOWSTAT (8:24)@  RR   @FLOWSTAT (9:24)@  SaO2     @FLOWSTAT (10:24)@       24 Hour I/O Current Shift I/O  Time Ins Outs No intake/output data recorded. @RRIOCURSHIFT @    GMedications Current Outpatient Prescriptions  Medication Sig Dispense Refill  . acetaminophen (TYLENOL) 325 MG tablet Take 650 mg by mouth every 6 (six) hours as needed for moderate pain.    . calcium carbonate (TUMS - DOSED IN MG ELEMENTAL CALCIUM) 500 MG chewable tablet Chew 2-3 tablets by mouth daily as needed for indigestion or heartburn.    . cetirizine (ZYRTEC) 10 MG tablet Take 10 mg by mouth daily.    Marland Kitchen esomeprazole (NEXIUM) 20 MG capsule Take 20 mg by mouth daily as needed (heartburn).    . fluticasone (FLONASE) 50 MCG/ACT nasal spray Place 2 sprays into both nostrils daily as needed for allergies.     Marland Kitchen ibuprofen (ADVIL,MOTRIN) 600 MG tablet Take 1 tablet (600 mg total) by mouth every 6 (six) hours as needed for mild pain, moderate pain or cramping. 30 tablet 0  . Cefixime (SUPRAX) 400 MG CAPS capsule Take 400 mg by mouth daily.    . ciprofloxacin-dexamethasone (CIPRODEX) otic suspension Place 4 drops into both ears 2 (two) times daily. (Patient not taking: Reported on 01/14/2017) 7.5 mL 0  .  Prenatal Multivit-Min-Fe-FA (PRENATAL VITAMINS PO) Take 1 tablet by mouth daily.      No current facility-administered medications for this visit.     Labs:   Recent Labs Lab 01/07/17 1657 01/09/17 0528 01/10/17 0519  WBC 11.0* 12.9* 11.6*  HGB 11.2* 7.1* 7.7*  HCT 34.6* 21.9* 23.6*  PLT 165 138* 162   No results for input(s): NA, K, CL, CO2, BUN, CREATININE, LABGLOM, GLUCOSE, CALCIUM in the last 168 hours.  Assessment & Plan:  ppday 6 doing wellMild postpartum hypertension will treat with HCTZ as patient is bottle feeding *Postpartum/postop: routine * bottle feeding  BC:Not discussed *Dispo: HCTZ 25 mg 2 weeks

## 2017-02-10 DIAGNOSIS — H5213 Myopia, bilateral: Secondary | ICD-10-CM | POA: Diagnosis not present

## 2017-02-11 ENCOUNTER — Ambulatory Visit (INDEPENDENT_AMBULATORY_CARE_PROVIDER_SITE_OTHER): Payer: 59 | Admitting: Advanced Practice Midwife

## 2017-02-11 ENCOUNTER — Encounter: Payer: Self-pay | Admitting: Advanced Practice Midwife

## 2017-02-11 DIAGNOSIS — F419 Anxiety disorder, unspecified: Secondary | ICD-10-CM | POA: Diagnosis not present

## 2017-02-11 NOTE — Progress Notes (Signed)
Tammy Chapman is a 26 y.o. who presents for a postpartum visit. She is 4 weeks postpartum following a low cervical transverse Cesarean section.for failed IOL (for GHTN) I have fully reviewed the prenatal and intrapartum course. The delivery was at 39.5 gestational weeks.  Anesthesia: spinal. Postpartum course has been uneventful. . Baby's course has been uneventful.other than being coliky. Baby is feeding by bottle. Bleeding: no bleeding. Bowel function is normal. Bladder function is normal. Patient is not sexually active. Contraception method is none. Postpartum depression screening: negative. However, she feels depressed:  Feels nauseated with anxitey, wants to sleep all the time.  Denies SI/HI.  Open to therapy/possibly meds.  Doesn't want BC now.    Current Outpatient Prescriptions:  .  acetaminophen (TYLENOL) 325 MG tablet, Take 650 mg by mouth every 6 (six) hours as needed for moderate pain., Disp: , Rfl:  .  calcium carbonate (TUMS - DOSED IN MG ELEMENTAL CALCIUM) 500 MG chewable tablet, Chew 2-3 tablets by mouth daily as needed for indigestion or heartburn., Disp: , Rfl:  .  Cefixime (SUPRAX) 400 MG CAPS capsule, Take 400 mg by mouth daily., Disp: , Rfl:  .  cetirizine (ZYRTEC) 10 MG tablet, Take 10 mg by mouth daily., Disp: , Rfl:  .  ciprofloxacin-dexamethasone (CIPRODEX) otic suspension, Place 4 drops into both ears 2 (two) times daily., Disp: 7.5 mL, Rfl: 0 .  esomeprazole (NEXIUM) 20 MG capsule, Take 20 mg by mouth daily as needed (heartburn)., Disp: , Rfl:  .  fluticasone (FLONASE) 50 MCG/ACT nasal spray, Place 2 sprays into both nostrils daily as needed for allergies. , Disp: , Rfl:  .  ibuprofen (ADVIL,MOTRIN) 600 MG tablet, Take 1 tablet (600 mg total) by mouth every 6 (six) hours as needed for mild pain, moderate pain or cramping., Disp: 30 tablet, Rfl: 0 .  hydrochlorothiazide (HYDRODIURIL) 25 MG tablet, Take 1 tablet (25 mg total) by mouth daily. (Patient not taking: Reported on  02/11/2017), Disp: 15 tablet, Rfl: 0 .  Prenatal Multivit-Min-Fe-FA (PRENATAL VITAMINS PO), Take 1 tablet by mouth daily. , Disp: , Rfl:   Review of Systems   Constitutional: Negative for fever and chills Eyes: Negative for visual disturbances Respiratory: Negative for shortness of breath, dyspnea Cardiovascular: Negative for chest pain or palpitations  Gastrointestinal: Negative for vomiting, diarrhea and constipation Genitourinary: Negative for dysuria and urgency Musculoskeletal: Negative for back pain, joint pain, myalgias  Neurological: Negative for dizziness and headaches    Objective:     Vitals:   02/11/17 0911  BP: 100/80  Pulse: 80   General:  alert, cooperative and no distress   Breasts:  negative  Lungs: clear to auscultation bilaterally  Heart:  regular rate and rhythm  Abdomen: Soft, nontender   Vulva:  normal  Vagina: normal vagina  Cervix:  closed  Corpus: Well involuted     Rectal Exam: no hemorrhoids        Assessment:    normal postpartum exam. Depression Plan:   1. Contraception: none 2. Follow up in: 8 weeks if her period has not started (PCOS,may need provera) or if she wants [facility]   or as needed.  3.  Go to [facility] and make and appt.

## 2017-03-25 DIAGNOSIS — F53 Puerperal psychosis: Secondary | ICD-10-CM | POA: Diagnosis not present

## 2017-03-25 DIAGNOSIS — K210 Gastro-esophageal reflux disease with esophagitis: Secondary | ICD-10-CM | POA: Diagnosis not present

## 2017-03-25 DIAGNOSIS — E669 Obesity, unspecified: Secondary | ICD-10-CM | POA: Diagnosis not present

## 2017-03-25 DIAGNOSIS — F419 Anxiety disorder, unspecified: Secondary | ICD-10-CM | POA: Diagnosis not present

## 2017-04-24 DIAGNOSIS — J3081 Allergic rhinitis due to animal (cat) (dog) hair and dander: Secondary | ICD-10-CM | POA: Diagnosis not present

## 2017-04-24 DIAGNOSIS — H6693 Otitis media, unspecified, bilateral: Secondary | ICD-10-CM | POA: Diagnosis not present

## 2017-04-24 DIAGNOSIS — T781XXA Other adverse food reactions, not elsewhere classified, initial encounter: Secondary | ICD-10-CM | POA: Diagnosis not present

## 2017-04-24 DIAGNOSIS — J3089 Other allergic rhinitis: Secondary | ICD-10-CM | POA: Diagnosis not present

## 2017-06-01 DIAGNOSIS — H6063 Unspecified chronic otitis externa, bilateral: Secondary | ICD-10-CM | POA: Diagnosis not present

## 2017-06-01 DIAGNOSIS — H903 Sensorineural hearing loss, bilateral: Secondary | ICD-10-CM | POA: Diagnosis not present

## 2017-06-01 DIAGNOSIS — K1121 Acute sialoadenitis: Secondary | ICD-10-CM | POA: Diagnosis not present

## 2017-06-01 DIAGNOSIS — H6983 Other specified disorders of Eustachian tube, bilateral: Secondary | ICD-10-CM | POA: Diagnosis not present

## 2017-06-04 DIAGNOSIS — H6690 Otitis media, unspecified, unspecified ear: Secondary | ICD-10-CM | POA: Diagnosis not present

## 2017-06-04 DIAGNOSIS — E282 Polycystic ovarian syndrome: Secondary | ICD-10-CM | POA: Diagnosis not present

## 2017-06-04 DIAGNOSIS — K210 Gastro-esophageal reflux disease with esophagitis: Secondary | ICD-10-CM | POA: Diagnosis not present

## 2017-06-04 DIAGNOSIS — R51 Headache: Secondary | ICD-10-CM | POA: Diagnosis not present

## 2017-07-01 DIAGNOSIS — J019 Acute sinusitis, unspecified: Secondary | ICD-10-CM | POA: Diagnosis not present

## 2017-07-01 DIAGNOSIS — R51 Headache: Secondary | ICD-10-CM | POA: Diagnosis not present

## 2017-07-01 DIAGNOSIS — E282 Polycystic ovarian syndrome: Secondary | ICD-10-CM | POA: Diagnosis not present

## 2017-07-01 DIAGNOSIS — K210 Gastro-esophageal reflux disease with esophagitis: Secondary | ICD-10-CM | POA: Diagnosis not present

## 2017-08-06 DIAGNOSIS — K210 Gastro-esophageal reflux disease with esophagitis: Secondary | ICD-10-CM | POA: Diagnosis not present

## 2017-08-06 DIAGNOSIS — E282 Polycystic ovarian syndrome: Secondary | ICD-10-CM | POA: Diagnosis not present

## 2017-08-06 DIAGNOSIS — R05 Cough: Secondary | ICD-10-CM | POA: Diagnosis not present

## 2017-08-06 DIAGNOSIS — R51 Headache: Secondary | ICD-10-CM | POA: Diagnosis not present

## 2017-08-06 DIAGNOSIS — J209 Acute bronchitis, unspecified: Secondary | ICD-10-CM | POA: Diagnosis not present

## 2017-10-06 ENCOUNTER — Other Ambulatory Visit (HOSPITAL_COMMUNITY): Payer: Self-pay | Admitting: Orthopedic Surgery

## 2017-10-06 DIAGNOSIS — M25561 Pain in right knee: Secondary | ICD-10-CM

## 2017-10-11 ENCOUNTER — Ambulatory Visit (HOSPITAL_COMMUNITY)
Admission: RE | Admit: 2017-10-11 | Discharge: 2017-10-11 | Disposition: A | Discharge status: Home / Self Care | Payer: No Typology Code available for payment source | Source: Ambulatory Visit | Attending: Orthopedic Surgery | Admitting: Orthopedic Surgery

## 2017-10-11 DIAGNOSIS — M25561 Pain in right knee: Secondary | ICD-10-CM | POA: Diagnosis present

## 2017-10-12 ENCOUNTER — Ambulatory Visit (HOSPITAL_COMMUNITY): Payer: No Typology Code available for payment source

## 2017-10-15 ENCOUNTER — Telehealth (HOSPITAL_COMMUNITY): Payer: Self-pay

## 2017-10-15 MED FILL — GABAPENTIN 100 MG CAP: 100 | 30 days supply | Qty: 30 | Fill #0

## 2017-10-15 NOTE — Telephone Encounter (Signed)
Media Tab/ FOTO DONE [facility] [employer] 1/1/-08/17/2018 Advised pt responsible to call request for referral for approval-2xs Benefits if pt calls in request NO Deduct  Copay 30.00 OOP 2500-0 MET visit-no limit -medical necessity Uncoordinated Benefits -pt did not call in ref request Deduct 500- 0 met  Co Ins 40% OOP 1586-8 met [place]mo [employer] Ref# [id number] NF 9/35 Electronic Card present -ask pt for ins card to scan in epic. Pt was advised to call [employer] to start approval process.  NF 10/15/17

## 2017-10-15 NOTE — Telephone Encounter (Signed)
Pt called ins and got approval Ref# [id number] per patient. NF 10/15/17

## 2017-10-19 ENCOUNTER — Encounter (HOSPITAL_COMMUNITY): Payer: Self-pay

## 2017-10-19 ENCOUNTER — Ambulatory Visit (HOSPITAL_COMMUNITY): Payer: No Typology Code available for payment source | Attending: Orthopedic Surgery

## 2017-10-19 DIAGNOSIS — G8929 Other chronic pain: Secondary | ICD-10-CM | POA: Insufficient documentation

## 2017-10-19 DIAGNOSIS — M25561 Pain in right knee: Secondary | ICD-10-CM | POA: Diagnosis not present

## 2017-10-19 DIAGNOSIS — R29898 Other symptoms and signs involving the musculoskeletal system: Secondary | ICD-10-CM | POA: Diagnosis present

## 2017-10-19 DIAGNOSIS — M6281 Muscle weakness (generalized): Secondary | ICD-10-CM | POA: Diagnosis present

## 2017-10-19 NOTE — Patient Instructions (Signed)
  BRIDGING  While lying on your back with knees bent, tighten your lower abdominals, squeeze your buttocks and then raise your buttocks off the floor/bed as creating a "Bridge" with your body. Hold and then lower yourself and repeat.  Perform 1-2x/day, 2-3 sets of 10-15 reps holding for 3-5 seconds at the top  QUAD SET  Tighten your top thigh muscle as you attempt to press the back of your knee downward towards the table.  Perform 1-2x/day, 2-3 sets of 10-15 reps holding for 3-5 seconds

## 2017-10-19 NOTE — Therapy (Signed)
[place], [place], [address] Phone: [phone]   Fax:  [phone]  Physical Therapy Evaluation  Patient Details  Name: Tammy Chapman MRN: [id number] Date of Birth: May 15, 1991 Referring Provider: Elsie Saas, MD   Encounter Date: 10/19/2017  PT End of Session - 10/19/17 1224    Visit Number  1    Number of Visits  13    Date for PT Re-Evaluation  11/09/17    Authorization Type  [employer]    Authorization Time Period  10/19/17 to 11/30/17    PT Start Time  0942    PT Stop Time  1025    PT Time Calculation (min)  43 min    Activity Tolerance  Patient tolerated treatment well    Behavior During Therapy  [facility] for tasks assessed/performed       Past Medical History:  Diagnosis Date  . GERD (gastroesophageal reflux disease)   . Loose body of right knee 02/2013  . Mosquito bite 03/08/2013  . Sprain and strain of medial collateral ligament of knee 02/2013   right    Past Surgical History:  Procedure Laterality Date  . CESAREAN SECTION N/A 01/08/2017   Procedure: CESAREAN SECTION;  Surgeon: Jonnie Kind, MD;  Location: [facility];  Service: Obstetrics;  Laterality: N/A;  . CHOLECYSTECTOMY  01/20/2008  . EAR TUBE REMOVAL  2002  . ESOPHAGOGASTRODUODENOSCOPY  05/15/2011   Procedure: ESOPHAGOGASTRODUODENOSCOPY (EGD);  Surgeon: Rogene Houston, MD;  [facility];  Service: Endoscopy;  Laterality: N/A;  3:15   . KNEE ARTHROSCOPY Right 03/15/2013   Procedure: RIGHT  KNEE ARTHROSCOPY, FEMORAL PATELLA REEFING, LATERAL RELEASE, PARTIAL LATERAL MENISCECTOMY ;  Surgeon: Lorn Junes, MD;  Location: [facility];  Service: Orthopedics;  Laterality: Right;  . TONSILLECTOMY AND ADENOIDECTOMY  1999  . TYMPANOSTOMY TUBE PLACEMENT  1999, 2011    There were no vitals filed for this visit.   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 She had "femoral-patellar knee repair surgery "by Dr. Elsie Saas 03/14/2013 but it did not help. She states that she can be doing anything and her R knee cap can dislocate. Both do it but the R is worse. She has a child now and her RLE is weak so she wants to get stronger so that she does not feel like she is going to drop her child. She denies any falls when her knee cap dislocates, just extreme pain. Her last dislocation was recently, but she's not sure when. She does report knee pain every day. Aggravating factors include touching it, walking, tempurature changes, any kind of activity. She denies any relieving factors. She has the most difficulty with walking, squatting, sitting on her knees.     Limitations  Sitting;Lifting;Walking    How long can you sit comfortably?  unlimited    How long can you stand comfortably?  continues to shift weight    How long can you walk comfortably?  fine on flat surface; inclines/uneven surfaces 15-30 mins    Patient Stated Goals  not have her patella give out; get it strong enough    Currently in Pain?  Yes    Pain Score  5     Pain Location  Knee    Pain Orientation  Right    Pain Descriptors /  Indicators  Dull;Aching    Pain Type  Chronic pain    Pain Onset  More than a month ago    Pain Frequency  Constant    Aggravating Factors   any WB activities, bending it, touching it    Pain Relieving Factors  unsure    Effect of Pain on Daily Activities  pushes through    Multiple Pain Sites  No         [facility] PT Assessment - 10/19/17 0001      Assessment   Medical Diagnosis  s/p R knee MPFL with weakness VMO, hypersensitivity    Referring Provider  Elsie Saas, MD    Onset Date/Surgical Date  -- since she was 27    Next MD Visit  11/26/17    Prior Therapy  PT on and off since she was 27 YO for same condition      Balance Screen   Has the patient fallen in the past 6 months  Yes    How many times?  1 up or  down stairs    Has the patient had a decrease in activity level because of a fear of falling?   No    Is the patient reluctant to leave their home because of a fear of falling?   No      Prior Function   Level of Independence  Independent    Vocation  Full time employment    Vocation Requirements  pushing, pulling, stairs    Leisure  play with kid      Functional Tests   Functional tests  Sit to Stand;Step down      Step Down   Comments  single leg from 7" step: R: increased knee valgus, unsteadiness, pain; L: min knee valgus      Sit to Stand   Comments  single leg STS: requried BUE support BLE; R: increased knee valgus, unsteadiness, pain; L: min knee valgus      ROM / Strength   AROM / PROM / Strength  AROM;Strength      AROM   AROM Assessment Site  Knee    Right/Left Knee  Right;Left    Right Knee Extension  3 hyperextension    Right Knee Flexion  135    Left Knee Extension  3 hyperextension    Left Knee Flexion  135      Strength   Strength Assessment Site  Hip;Knee;Ankle    Right Hip Flexion  4-/5    Right Hip Extension  4/5    Right Hip ABduction  4-/5    Left Hip Flexion  5/5    Left Hip Extension  4+/5    Left Hip ABduction  4/5    Right Knee Flexion  3+/5    Right Knee Extension  4/5    Left Knee Flexion  4+/5    Left Knee Extension  5/5    Right Ankle Dorsiflexion  4/5    Left Ankle Dorsiflexion  5/5      Flexibility   Soft Tissue Assessment /Muscle Length  yes    Hamstrings  R: 133deg (90/90); L: 166deg (90/90)      Palpation   Patella mobility  hypermobile med/lat BLE, R>L; difficult to assess sup/inf due to allodynia    Palpation comment  allodynia to very light touch on R distal HS, distal quad, and med/lateral joint line      Ambulation/Gait   Ambulation Distance (Feet)  612 Feet  Assistive device  None    Gait Pattern  Step-through pattern;Trendelenburg    Gait Comments  Tberg bil; no increases in pain      Balance   Balance Assessed  Yes       Static Standing Balance   Static Standing - Balance Support  No upper extremity supported    Static Standing Balance -  Activities   Single Leg Stance - Right Leg;Single Leg Stance - Left Leg    Static Standing - Comment/# of Minutes  30 sec BLE firm and foam, slight increase unsteadiness on RLE but [facility]      Standardized Balance Assessment   Standardized Balance Assessment  Five Times Sit to Stand    Five times sit to stand comments   9.3 sec, no UE, chair          Objective measurements completed on examination: See above findings.       PT Education - 10/19/17 1235    Education provided  Yes    Education Details  exam findings, POC, HEP    Person(s) Educated  Patient    Methods  Explanation;Handout    Comprehension  Verbalized understanding       PT Short Term Goals - 10/19/17 1239      PT SHORT TERM GOAL #1   Title  Pt will be independent with HEP and perform consistently in order to decrease pain and maximize strength.     Time  3    Period  Weeks    Status  New    Target Date  11/09/17      PT SHORT TERM GOAL #2   Title  Pt will have at least 1/2 grade improvement in MMT of all muscle groups tested in order to decrease pain and maximize pt ability to perform functional tasks.    Time  3    Period  Weeks    Status  New        PT Long Term Goals - 10/19/17 1239      PT LONG TERM GOAL #1   Title  Pt will have at least 1 grade improvement in MMT of all muscle groups tested in order to maximize overall function at home and allow pt to hold her child with greater ease.    Time  6    Period  Weeks    Target Date  11/30/17      PT LONG TERM GOAL #2   Title  Pt will be able to perform 5 reps of single let step downs and single leg sit to stands on the RLE with proper form to demonstrate improved functional strength and maximize pt's stair and ladder ambulation in order to maximize function at work and home.    Time  6    Period  Weeks    Status  New       PT LONG TERM GOAL #3   Title  Pt will report being able to work a full 8-hour shift at work and have 4/10 R knee pain or < following to demonstrate improved funtional strength and endurance.    Time  6    Period  Weeks    Status  New      PT LONG TERM GOAL #4   Title  Pt will have at least 126ft improvement during 3MWT or > with min to no evidence of Trendelenberg sign to demonstrate improved gluteal and functional strength and to maximize pt's ability to work with  less pain afterwards.     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 She reports this has been an on-going issue since she was 27 YO. Pt states she had surgery for this in 2014 but states that it did not help (per referral, pt had MPFL which is medial patellofemoral ligament reconstruction). She currently presents with deficits in RLE strength, functional strength, mild deficits in gait, and difficulty performing functional tasks such as single leg step downs and single leg sit to stands. Pt demonstrating allodynia to very light touch to the R knee and its surrounding musculature. Pt's R HS length in 90/90 position noted to be significantly tighter than the L HS. She was also noted to have 3 deg knee hyperextension to 135 deg knee flexion BLE. Pt needs skilled PT intervention to address these deficits in order to decrease pain and maximize overall function at home and work.     History and Personal Factors relevant to plan of care:  pt reports having Ehlers-Danlos Syndrome; young, working full-time    Clinical Presentation  Evolving    Clinical Presentation due to:  chronicity of issue with multiple patellar subluxes; MMT    Clinical Decision Making  Moderate    Rehab Potential  Fair    PT Frequency  2x / week    PT Duration  6 weeks    PT Treatment/Interventions   ADLs/Self Care Home Management;Cryotherapy;Electrical Stimulation;Moist Heat;Ultrasound;Gait training;Stair training;Functional mobility training;Therapeutic activities;Therapeutic exercise;Balance training;Neuromuscular re-education;Patient/family education;Orthotic Fit/Training;Manual techniques;Dry needling;Taping    PT Next Visit Plan  review goals and HEP; apply TENS for pain control during therex; initiate HS stretch, BLE and functional strengthening; add SAQ to HEP; potentially kinesiotape patella for support; techniques to improve tolerance to manual and functional activities    PT Home Exercise Plan  eval: bridging, quad set    Consulted and Agree with Plan of Care  Patient       Patient will benefit from skilled therapeutic intervention in order to improve the following deficits and impairments:  Decreased strength, Difficulty walking, Hypermobility, Impaired flexibility, Improper body mechanics, Pain, Impaired sensation  Visit Diagnosis: Chronic pain of right knee - Plan: PT plan of care cert/re-cert  Muscle weakness (generalized) - Plan: PT plan of care cert/re-cert  Other symptoms and signs involving the musculoskeletal system - Plan: PT plan of care cert/re-cert     Problem List Patient Active Problem List   Diagnosis Date Noted  . Chronic ear infection, bilateral 07/03/2016  . 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 Anovulation 02/01/2016  . Knee pain 04/08/2012  . Knee stiffness 04/08/2012  . Difficulty in walking(719.7) 04/08/2012  . Abnormality of gait 04/08/2012  . GERD (gastroesophageal reflux disease) 08/04/2011  . Abdominal pain 08/04/2011  . Diarrhea 08/04/2011       Geraldine Solar PT, DPT  [facility] [address] [place], [place], [address] Phone: [phone]   Fax:  [phone]  Name: ANAB VIVAR MRN: [id number] Date of Birth: 01/12/1991

## 2017-10-21 ENCOUNTER — Encounter (HOSPITAL_COMMUNITY): Payer: Self-pay

## 2017-10-21 ENCOUNTER — Ambulatory Visit (HOSPITAL_COMMUNITY): Payer: No Typology Code available for payment source

## 2017-10-21 DIAGNOSIS — R29898 Other symptoms and signs involving the musculoskeletal system: Secondary | ICD-10-CM

## 2017-10-21 DIAGNOSIS — M6281 Muscle weakness (generalized): Secondary | ICD-10-CM

## 2017-10-21 DIAGNOSIS — M25561 Pain in right knee: Principal | ICD-10-CM

## 2017-10-21 DIAGNOSIS — G8929 Other chronic pain: Secondary | ICD-10-CM

## 2017-10-21 NOTE — Patient Instructions (Signed)
  STRAIGHT LEG RAISE - SLR  While lying on your back, raise up your leg with a straight knee.  Keep the opposite knee bent with the foot planted on the ground.  Perform at least every other day, 2-3 sets of 10-15 reps -- make sure you are keeping your knee straight the entire time*

## 2017-10-21 NOTE — Therapy (Signed)
[place], [place], [address] Phone: [phone]   Fax:  [phone]  Physical Therapy Treatment  Patient Details  Name: Tammy Chapman MRN: [id number] Date of Birth: 05/25/1991 Referring Provider: Elsie Saas, MD   Encounter Date: 10/21/2017  PT End of Session - 10/21/17 0859    Visit Number  2    Number of Visits  13    Date for PT Re-Evaluation  11/09/17    Authorization Type  [employer]    Authorization Time Period  10/19/17 to 11/30/17    PT Start Time  0900    PT Stop Time  0941    PT Time Calculation (min)  41 min    Activity Tolerance  Patient tolerated treatment well    Behavior During Therapy  [facility] for tasks assessed/performed       Past Medical History:  Diagnosis Date  . GERD (gastroesophageal reflux disease)   . Loose body of right knee 02/2013  . Mosquito bite 03/08/2013  . Sprain and strain of medial collateral ligament of knee 02/2013   right    Past Surgical History:  Procedure Laterality Date  . CESAREAN SECTION N/A 01/08/2017   Procedure: CESAREAN SECTION;  Surgeon: Jonnie Kind, MD;  Location: [facility];  Service: Obstetrics;  Laterality: N/A;  . CHOLECYSTECTOMY  01/20/2008  . EAR TUBE REMOVAL  2002  . ESOPHAGOGASTRODUODENOSCOPY  05/15/2011   Procedure: ESOPHAGOGASTRODUODENOSCOPY (EGD);  Surgeon: Rogene Houston, MD;  [facility];  Service: Endoscopy;  Laterality: N/A;  3:15   . KNEE ARTHROSCOPY Right 03/15/2013   Procedure: RIGHT  KNEE ARTHROSCOPY, FEMORAL PATELLA REEFING, LATERAL RELEASE, PARTIAL LATERAL MENISCECTOMY ;  Surgeon: Lorn Junes, MD;  Location: [facility];  Service: Orthopedics;  Laterality: Right;  . TONSILLECTOMY AND ADENOIDECTOMY  1999  . TYMPANOSTOMY TUBE PLACEMENT  1999, 2011    There were no vitals filed for this visit.  Subjective Assessment - 10/21/17 0900    Subjective  Pt states that she feels about the same today.  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     Limitations  Sitting;Lifting;Walking    How long can you sit comfortably?  unlimited    How long can you stand comfortably?  continues to shift weight    How long can you walk comfortably?  fine on flat surface; inclines/uneven surfaces 15-30 mins    Patient Stated Goals  not have her patella give out; get it strong enough    Currently in Pain?  Yes    Pain Score  5     Pain Location  Knee    Pain Orientation  Right    Pain Descriptors / Indicators  Aching;Dull    Pain Type  Chronic pain    Pain Onset  More than a month ago    Pain Frequency  Constant    Aggravating Factors   any WB activities, bending it, touching it     Pain Relieving Factors  unsure    Effect of Pain on Daily Activities  pushes through             [facility] Adult PT Treatment/Exercise - 10/21/17 0001      Exercises   Exercises  Knee/Hip      Knee/Hip Exercises: Stretches   Passive Hamstring Stretch  Right;3 reps;30 seconds  Passive Hamstring Stretch Limitations  supine with rope      Knee/Hip Exercises: Seated   Long Arc Quad  Right;2 sets;10 reps    Sit to [employer]  10 reps;without UE support 2 sets      Knee/Hip Exercises: Supine   Quad Sets  Right;2 sets;10 reps    Quad Sets Limitations  3-5" holds    Short Arc [employer]  Right;2 sets;10 reps    Short Arc [employer] Limitations  basketball, 3-5" holds    Bridges  Both;2 sets;10 reps    Single Leg Bridge  Right;2 sets;10 reps    Straight Leg Raises  Right;2 sets;10 reps      Knee/Hip Exercises: Sidelying   Hip ABduction  Right;2 sets;10 reps      Modalities   Modalities  Teacher, English as a foreign language Location  R distal quad and R anterior lower leg    Electrical Stimulation Action  TENS    Electrical Stimulation Parameters  Pulse width: 250; Frequency: 50Hz     Electrical Stimulation  Goals  Pain;Other (comment) desensitization            PT Education - 10/21/17 0859    Education provided  Yes    Education Details  reveiwed goals, HEP; exercise technique; updated HEP and educated on how to perform strengthening exercises(every other day to allow for proper muscle recovery)    Person(s) Educated  Patient    Methods  Explanation;Demonstration;Handout    Comprehension  Verbalized understanding;Returned demonstration       PT Short Term Goals - 10/19/17 1239      PT SHORT TERM GOAL #1   Title  Pt will be independent with HEP and perform consistently in order to decrease pain and maximize strength.     Time  3    Period  Weeks    Status  New    Target Date  11/09/17      PT SHORT TERM GOAL #2   Title  Pt will have at least 1/2 grade improvement in MMT of all muscle groups tested in order to decrease pain and maximize pt ability to perform functional tasks.    Time  3    Period  Weeks    Status  New        PT Long Term Goals - 10/19/17 1239      PT LONG TERM GOAL #1   Title  Pt will have at least 1 grade improvement in MMT of all muscle groups tested in order to maximize overall function at home and allow pt to hold her child with greater ease.    Time  6    Period  Weeks    Target Date  11/30/17      PT LONG TERM GOAL #2   Title  Pt will be able to perform 5 reps of single let step downs and single leg sit to stands on the RLE with proper form to demonstrate improved functional strength and maximize pt's stair and ladder ambulation in order to maximize function at work and home.    Time  6    Period  Weeks    Status  New      PT LONG TERM GOAL #3   Title  Pt will report being able to work a full 8-hour shift at work and have 4/10 R knee pain or < following to demonstrate improved funtional strength and endurance.  Time  6    Period  Weeks    Status  New      PT LONG TERM GOAL #4   Title  Pt will have at least 133ft improvement during 3MWT or >  with min to no evidence of Trendelenberg sign to demonstrate improved gluteal and functional strength and to maximize pt's ability to work with less pain afterwards.     Time  3    Period  Weeks    Status  New            Plan - 10/21/17 0263    Clinical Impression Statement  Began session by reviewing goals and issuing copy of eval; no f/u questions. Applied TENS to R quad and R anterior lower leg at beginning of session to help facilitate desensitization of R quad/leg and it remained on during all of therex. Therex focused on RLE and functional strengthening as well as general improvement in tolerance to movement and muscle activation/stimulation. Min knee lag noted during SLR as this exercise was very difficult for pt to perform due to weakness; added this to HEP and educated pt to perform strengthening exercises every other day to allow for muscle recovery and healing in order to promote strengthening. Continue as planned and progress as able. Pt reported no pain at EOS, just still some residual tingling from the TENS.    Rehab Potential  Fair    PT Frequency  2x / week    PT Duration  6 weeks    PT Treatment/Interventions  ADLs/Self Care Home Management;Cryotherapy;Electrical Stimulation;Moist Heat;Ultrasound;Gait training;Stair training;Functional mobility training;Therapeutic activities;Therapeutic exercise;Balance training;Neuromuscular re-education;Patient/family education;Orthotic Fit/Training;Manual techniques;Dry needling;Taping    PT Next Visit Plan  apply TENS for pain control during therex; continue and progress BLE and functional strengthening as toelrated; add SAQ to HEP; potentially kinesiotape patella for support; techniques to improve tolerance to manual and functional activities    PT Home Exercise Plan  eval: bridging, quad set; 3/6: SLR    Consulted and Agree with Plan of Care  Patient       Patient will benefit from skilled therapeutic intervention in order to improve  the following deficits and impairments:  Decreased strength, Difficulty walking, Hypermobility, Impaired flexibility, Improper body mechanics, Pain, Impaired sensation  Visit Diagnosis: Chronic pain of right knee  Muscle weakness (generalized)  Other symptoms and signs involving the musculoskeletal system     Problem List Patient Active Problem List   Diagnosis Date Noted  . Chronic ear infection, bilateral 07/03/2016  . 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 Anovulation 02/01/2016  . Knee pain 04/08/2012  . Knee stiffness 04/08/2012  . Difficulty in walking(719.7) 04/08/2012  . Abnormality of gait 04/08/2012  . GERD (gastroesophageal reflux disease) 08/04/2011  . Abdominal pain 08/04/2011  . Diarrhea 08/04/2011      Geraldine Solar PT, DPT  [facility] [address] [place], [place], [address] Phone: [phone]   Fax:  [phone]  Name: LISSET KETCHEM MRN: [id number] Date of Birth: 03-14-1991

## 2017-10-27 ENCOUNTER — Ambulatory Visit (HOSPITAL_COMMUNITY): Payer: No Typology Code available for payment source

## 2017-10-27 DIAGNOSIS — M25561 Pain in right knee: Secondary | ICD-10-CM | POA: Diagnosis not present

## 2017-10-27 DIAGNOSIS — G8929 Other chronic pain: Secondary | ICD-10-CM

## 2017-10-27 DIAGNOSIS — R29898 Other symptoms and signs involving the musculoskeletal system: Secondary | ICD-10-CM

## 2017-10-27 DIAGNOSIS — M6281 Muscle weakness (generalized): Secondary | ICD-10-CM

## 2017-10-27 NOTE — Therapy (Signed)
[place], [place], [address] Phone: [phone]   Fax:  [phone]  Physical Therapy Treatment  Patient Details  Name: Tammy Chapman MRN: [id number] Date of Birth: 02/01/1991 Referring Provider: Elsie Saas, MD   Encounter Date: 10/27/2017  PT End of Session - 10/27/17 0902    Visit Number  3    Number of Visits  13    Date for PT Re-Evaluation  11/09/17    Authorization Type  [facility]    Authorization Time Period  10/19/17 to 11/30/17    PT Start Time  0901    PT Stop Time  0941    PT Time Calculation (min)  40 min    Activity Tolerance  Patient tolerated treatment well    Behavior During Therapy  [facility] for tasks assessed/performed       Past Medical History:  Diagnosis Date  . GERD (gastroesophageal reflux disease)   . Loose body of right knee 02/2013  . Mosquito bite 03/08/2013  . Sprain and strain of medial collateral ligament of knee 02/2013   right    Past Surgical History:  Procedure Laterality Date  . CESAREAN SECTION N/A 01/08/2017   Procedure: CESAREAN SECTION;  Surgeon: Jonnie Kind, MD;  Location: [place];  Service: Obstetrics;  Laterality: N/A;  . CHOLECYSTECTOMY  01/20/2008  . EAR TUBE REMOVAL  2002  . ESOPHAGOGASTRODUODENOSCOPY  05/15/2011   Procedure: ESOPHAGOGASTRODUODENOSCOPY (EGD);  Surgeon: Rogene Houston, MD;  [facility];  Service: Endoscopy;  Laterality: N/A;  3:15   . KNEE ARTHROSCOPY Right 03/15/2013   Procedure: RIGHT  KNEE ARTHROSCOPY, FEMORAL PATELLA REEFING, LATERAL RELEASE, PARTIAL LATERAL MENISCECTOMY ;  Surgeon: Lorn Junes, MD;  Location: [place];  Service: Orthopedics;  Laterality: Right;  . TONSILLECTOMY AND ADENOIDECTOMY  1999  . TYMPANOSTOMY TUBE PLACEMENT  1999, 2011    There were no vitals filed for this visit.  Subjective Assessment - 10/27/17 0902    Subjective  Pt states that her knee did not want to bend  this morning when going down the stairs. She states that it popped right in front and then it was bending again. She is currently in 4-5/10 achey pain this morning. She states she was pertty sore (like a gym sore) following last session.    Limitations  Sitting;Lifting;Walking    How long can you sit comfortably?  unlimited    How long can you stand comfortably?  continues to shift weight    How long can you walk comfortably?  fine on flat surface; inclines/uneven surfaces 15-30 mins    Patient Stated Goals  not have her patella give out; get it strong enough    Currently in Pain?  Yes    Pain Score  5     Pain Location  Knee    Pain Orientation  Right    Pain Descriptors / Indicators  Aching;Dull    Pain Type  Chronic pain    Pain Onset  More than a month ago    Pain Frequency  Constant    Aggravating Factors   any WB activities, bending it, touching it    Pain Relieving Factors  unsure    Effect of Pain on Daily Activities  pushes through          [facility] Adult PT Treatment/Exercise - 10/27/17 0001      Knee/Hip Exercises: Standing   Heel Raises  Right;10  reps;Limitations    Heel Raises Limitations  R single leg heel and toe raises    Hip Abduction  Both;2 sets;10 reps    Abduction Limitations  GTB    Hip Extension  Both;2 sets;10 reps    Extension Limitations  GTB    Lateral Step Up  Right;10 reps;Step Height: 4"    Forward Step Up  Right;10 reps;Hand Hold: 0;Step Height: 4"    Step Down  Right;10 reps;Step Height: 4"    Functional Squat  10 reps min cues for proper technique    Wall Squat  2 sets;10 reps    Other Standing Knee Exercises  sidestepping with GTB 78ft x3RT      Knee/Hip Exercises: Seated   Marching  Right;2 sets;10 reps    Marching Limitations  2-3" holds    Sit to Sand  2 sets;10 reps;without UE support GTB around knees      Knee/Hip Exercises: Supine   Quad Sets  Right;10 reps    Quad Sets Limitations  3-5" holds    Short Arc [employer]  Right;5 reps     Short Arc Quad Sets Limitations  basketball, increased pain so unable to perform anymore reps    Single Leg Bridge  Right;2 sets;10 reps    Straight Leg Raises  Right;2 sets;10 reps      Knee/Hip Exercises: Sidelying   Clams  RLE, 2x10 GTB       Modalities   Modalities  Teacher, English as a foreign language Location  R distal quad and R anterior lower leg    Electrical Stimulation Action  TENS (    Electrical Stimulation Parameters  Pulse width: 250; Frequency 50Hz     Electrical Stimulation Goals  Pain;Other (comment) desensitization         PT Education - 10/27/17 0929    Education provided  Yes    Education Details  exercise technique, updated HEP    Person(s) Educated  Patient    Methods  Explanation;Demonstration;Handout    Comprehension  Verbalized understanding;Returned demonstration       PT Short Term Goals - 10/19/17 1239      PT SHORT TERM GOAL #1   Title  Pt will be independent with HEP and perform consistently in order to decrease pain and maximize strength.     Time  3    Period  Weeks    Status  New    Target Date  11/09/17      PT SHORT TERM GOAL #2   Title  Pt will have at least 1/2 grade improvement in MMT of all muscle groups tested in order to decrease pain and maximize pt ability to perform functional tasks.    Time  3    Period  Weeks    Status  New        PT Long Term Goals - 10/19/17 1239      PT LONG TERM GOAL #1   Title  Pt will have at least 1 grade improvement in MMT of all muscle groups tested in order to maximize overall function at home and allow pt to hold her child with greater ease.    Time  6    Period  Weeks    Target Date  11/30/17      PT LONG TERM GOAL #2   Title  Pt will be able to perform 5 reps of single let step downs and single leg sit to  stands on the RLE with proper form to demonstrate improved functional strength and maximize pt's stair and ladder ambulation in order to maximize  function at work and home.    Time  6    Period  Weeks    Status  New      PT LONG TERM GOAL #3   Title  Pt will report being able to work a full 8-hour shift at work and have 4/10 R knee pain or < following to demonstrate improved funtional strength and endurance.    Time  6    Period  Weeks    Status  New      PT LONG TERM GOAL #4   Title  Pt will have at least 11ft improvement during 3MWT or > with min to no evidence of Trendelenberg sign to demonstrate improved gluteal and functional strength and to maximize pt's ability to work with less pain afterwards.     Time  3    Period  Weeks    Status  New            Plan - 10/27/17 0941    Clinical Impression Statement  Continued with established therex focusing on RLE and functional strengthening with TENS applied to R anterior knee for pain control/desensitization. Pt continues to demo significant difficulty with SLR; she had increased anterior knee pain with SAQ this date and stated she couldn't perform the exercise anymore after 5 reps (not added to HEP this date due to this). Progressed pt's strengthening in standing this date with good tolerance, however, pt did report some pain and "popping" with wall slides. Pt reporting 0/10 pain at EOS. Continue as planned, progressing as able.    Rehab Potential  Fair    PT Frequency  2x / week    PT Duration  6 weeks    PT Treatment/Interventions  ADLs/Self Care Home Management;Cryotherapy;Electrical Stimulation;Moist Heat;Ultrasound;Gait training;Stair training;Functional mobility training;Therapeutic activities;Therapeutic exercise;Balance training;Neuromuscular re-education;Patient/family education;Orthotic Fit/Training;Manual techniques;Dry needling;Taping    PT Next Visit Plan  apply TENS for pain control during therex; continue and progress BLE and functional strengthening as toelrated; trial SAQ again and add to HEP if tolerates well; add dynamic hip stability work; potentially  kinesiotape patella for support; techniques to improve tolerance to manual and functional activities    PT Home Exercise Plan  eval: bridging, quad set; 3/6: SLR; 3/12: sidelying hip abd, seated march    Consulted and Agree with Plan of Care  Patient       Patient will benefit from skilled therapeutic intervention in order to improve the following deficits and impairments:  Decreased strength, Difficulty walking, Hypermobility, Impaired flexibility, Improper body mechanics, Pain, Impaired sensation  Visit Diagnosis: Chronic pain of right knee  Muscle weakness (generalized)  Other symptoms and signs involving the musculoskeletal system     Problem List Patient Active Problem List   Diagnosis Date Noted  . Chronic ear infection, bilateral 07/03/2016  . 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 Anovulation 02/01/2016  . Knee pain 04/08/2012  . Knee stiffness 04/08/2012  . Difficulty in walking(719.7) 04/08/2012  . Abnormality of gait 04/08/2012  . GERD (gastroesophageal reflux disease) 08/04/2011  . Abdominal pain 08/04/2011  . Diarrhea 08/04/2011       Geraldine Solar PT, DPT  [place] [address] [place], [place], [address] Phone: [phone]   Fax:  [phone]  Name: TYNISA VOHS MRN: [id number] Date of Birth: 10/05/1990

## 2017-10-27 NOTE — Patient Instructions (Addendum)
  HIP ABDUCTION - SIDELYING  While lying on your side, slowly raise up your top leg to the side. Keep your knee straight and maintain your toes pointed forward the entire time. Keep your leg in-line with your body.  The bottom leg can be bent to stabilize your body.  Perform 1x/day, 2-3 sets of 10-15 reps on the right leg   SEATED MARCHING  While seated in a chair, lift up your foot and knee, set it down and then lift again.  Perform 1x/day, 2-3 sets of 10-15 reps on the right leg

## 2017-10-29 ENCOUNTER — Encounter (HOSPITAL_COMMUNITY): Payer: Self-pay

## 2017-10-29 ENCOUNTER — Ambulatory Visit (HOSPITAL_COMMUNITY): Payer: No Typology Code available for payment source

## 2017-10-29 DIAGNOSIS — M25561 Pain in right knee: Principal | ICD-10-CM

## 2017-10-29 DIAGNOSIS — M6281 Muscle weakness (generalized): Secondary | ICD-10-CM

## 2017-10-29 DIAGNOSIS — R29898 Other symptoms and signs involving the musculoskeletal system: Secondary | ICD-10-CM

## 2017-10-29 DIAGNOSIS — G8929 Other chronic pain: Secondary | ICD-10-CM

## 2017-10-29 NOTE — Therapy (Signed)
[place], [place], [address] Phone: [phone]   Fax:  [phone]  Physical Therapy Treatment  Patient Details  Name: Tammy Chapman MRN: [id number] Date of Birth: 06-Jun-1991 Referring Provider: Elsie Saas, MD   Encounter Date: 10/29/2017  PT End of Session - 10/29/17 0918    Visit Number  4    Number of Visits  13    Date for PT Re-Evaluation  11/30/17 minireassess 11/09/17    Authorization Type  [facility] [employer]    Authorization Time Period  10/19/17 to 11/30/17    PT Start Time  0908    PT Stop Time  0948    PT Time Calculation (min)  40 min    Activity Tolerance  Patient tolerated treatment well    Behavior During Therapy  [facility] for tasks assessed/performed       Past Medical History:  Diagnosis Date  . GERD (gastroesophageal reflux disease)   . Loose body of right knee 02/2013  . Mosquito bite 03/08/2013  . Sprain and strain of medial collateral ligament of knee 02/2013   right    Past Surgical History:  Procedure Laterality Date  . CESAREAN SECTION N/A 01/08/2017   Procedure: CESAREAN SECTION;  Surgeon: Jonnie Kind, MD;  Location: [place];  Service: Obstetrics;  Laterality: N/A;  . CHOLECYSTECTOMY  01/20/2008  . EAR TUBE REMOVAL  2002  . ESOPHAGOGASTRODUODENOSCOPY  05/15/2011   Procedure: ESOPHAGOGASTRODUODENOSCOPY (EGD);  Surgeon: Rogene Houston, MD;  [facility];  Service: Endoscopy;  Laterality: N/A;  3:15   . KNEE ARTHROSCOPY Right 03/15/2013   Procedure: RIGHT  KNEE ARTHROSCOPY, FEMORAL PATELLA REEFING, LATERAL RELEASE, PARTIAL LATERAL MENISCECTOMY ;  Surgeon: Lorn Junes, MD;  Location: [place];  Service: Orthopedics;  Laterality: Right;  . TONSILLECTOMY AND ADENOIDECTOMY  1999  . TYMPANOSTOMY TUBE PLACEMENT  1999, 2011    There were no vitals filed for this visit.  Subjective Assessment - 10/29/17 0914    Subjective  Pt stated she continues to  have pain 3-4/10 dull achey pain.  Reports tingling for the rest of day following TENS unit for pain control.  Compliant with HEP.      Patient Stated Goals  not have her patella give out; get it strong enough    Currently in Pain?  Yes    Pain Score  4     Pain Location  Knee    Pain Orientation  Right    Pain Descriptors / Indicators  Dull;Aching    Pain Type  Chronic pain    Pain Onset  More than a month ago    Pain Frequency  Constant    Aggravating Factors   any WB activities, bending it, touching it    Pain Relieving Factors  unsure    Effect of Pain on Daily Activities  pushes through                      [facility] Adult PT Treatment/Exercise - 10/29/17 0001      Knee/Hip Exercises: Standing   Lateral Step Up  Right;10 reps;Hand Hold: 0;Step Height: 4"    Forward Step Up  Right;10 reps;Hand Hold: 0;Step Height: 6" c/o anterior knee pain    Wall Squat  15 reps;5 seconds ball between knees for VMO    Other Standing Knee Exercises  sidestepping with GTB 58ft x3RT      Knee/Hip Exercises:  Seated   Marching  15 reps sitting on theraball    Marching Limitations  2-3" holds    Sit to [employer]  20 reps;without UE support eccentric control STS from theraball      Knee/Hip Exercises: Supine   Quad Sets  Right;10 reps    Quad Sets Limitations  3-5" holds    Short Arc [employer]  Right;15 reps    Short Arc Quad Sets Limitations  improved tolerance with bolster    Single Leg Bridge  15 reps      Acupuncturist Location  R distal quad and R anterior lower leg    Electrical Stimulation Action  TENS  complete during supine and seated activiites    Electrical Stimulation Parameters  Pulse width 250; Frequency 50Hz     Electrical Stimulation Goals  Pain;Other (comment) desensitization during supine/seated exercises      Manual Therapy   Manual Therapy  Taping    Manual therapy comments  Manual complete separate than rest of tx, kinesiotape for  lateral patella shifting with standing therex    Kinesiotex  Facilitate Muscle             PT Education - 10/29/17 0945    Education provided  Yes    Education Details  Kinesiotape techniques to assist with lateral patella control.  Given flyer with education on purchase of TENS unit for pain control    Person(s) Educated  Patient    Methods  Tactile cues;Explanation    Comprehension  Verbalized understanding;Returned demonstration       PT Short Term Goals - 10/19/17 1239      PT SHORT TERM GOAL #1   Title  Pt will be independent with HEP and perform consistently in order to decrease pain and maximize strength.     Time  3    Period  Weeks    Status  New    Target Date  11/09/17      PT SHORT TERM GOAL #2   Title  Pt will have at least 1/2 grade improvement in MMT of all muscle groups tested in order to decrease pain and maximize pt ability to perform functional tasks.    Time  3    Period  Weeks    Status  New        PT Long Term Goals - 10/19/17 1239      PT LONG TERM GOAL #1   Title  Pt will have at least 1 grade improvement in MMT of all muscle groups tested in order to maximize overall function at home and allow pt to hold her child with greater ease.    Time  6    Period  Weeks    Target Date  11/30/17      PT LONG TERM GOAL #2   Title  Pt will be able to perform 5 reps of single let step downs and single leg sit to stands on the RLE with proper form to demonstrate improved functional strength and maximize pt's stair and ladder ambulation in order to maximize function at work and home.    Time  6    Period  Weeks    Status  New      PT LONG TERM GOAL #3   Title  Pt will report being able to work a full 8-hour shift at work and have 4/10 R knee pain or < following to demonstrate improved funtional strength and endurance.  Time  6    Period  Weeks    Status  New      PT LONG TERM GOAL #4   Title  Pt will have at least 155ft improvement during 3MWT or  > with min to no evidence of Trendelenberg sign to demonstrate improved gluteal and functional strength and to maximize pt's ability to work with less pain afterwards.     Time  3    Period  Weeks    Status  New            Plan - 10/29/17 1137    Clinical Impression Statement  Continued with PT POC for LE strengthening and pain control.  TENS unit utilized during supine and seated exercises for pain control.  Improved tolerance with SAQ this session with use of bolster vs. basketball.  Did progress to dynamic theraball during sitting exercises.  Added kinesiotape for lateral patella facilitaiton during standing exercises with increased focus on VMO strengthening.  Pt reports improved tolerance and no lateral shifting, did c/o anterior patella pain during forward step ups, no pain at rest.  Pt given paperwork to assist with purchasing personal TENS unit and reviewed placement of tape for her to resume the kinesiotape at home.  No reports of pain at EOS.      Rehab Potential  Fair    PT Frequency  2x / week    PT Duration  6 weeks    PT Treatment/Interventions  ADLs/Self Care Home Management;Cryotherapy;Electrical Stimulation;Moist Heat;Ultrasound;Gait training;Stair training;Functional mobility training;Therapeutic activities;Therapeutic exercise;Balance training;Neuromuscular re-education;Patient/family education;Orthotic Fit/Training;Manual techniques;Dry needling;Taping    PT Next Visit Plan  F/U with assistance wiht kinesiotape for patella facilitation.  Continue wiht TENS for pain control during therex, answer questions PRN for personal purchases.  Add SAQ to HEP next session and increased dynamic hip stability work.    PT Home Exercise Plan  eval: bridging, quad set; 3/6: SLR; 3/12: sidelying hip abd, seated march       Patient will benefit from skilled therapeutic intervention in order to improve the following deficits and impairments:  Decreased strength, Difficulty walking,  Hypermobility, Impaired flexibility, Improper body mechanics, Pain, Impaired sensation  Visit Diagnosis: Chronic pain of right knee  Muscle weakness (generalized)  Other symptoms and signs involving the musculoskeletal system     Problem List Patient Active Problem List   Diagnosis Date Noted  . Chronic ear infection, bilateral 07/03/2016  . 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 Anovulation 02/01/2016  . Knee pain 04/08/2012  . Knee stiffness 04/08/2012  . Difficulty in walking(719.7) 04/08/2012  . Abnormality of gait 04/08/2012  . GERD (gastroesophageal reflux disease) 08/04/2011  . Abdominal pain 08/04/2011  . Diarrhea 08/04/2011   Ihor Austin, [place]; [place]  Aldona Lento 10/29/2017, 11:52 AM  [place] [address] [place], [place], [address] Phone: [phone]   Fax:  [phone]  Name: Tammy Chapman MRN: [id number] Date of Birth: Dec 24, 1990

## 2017-11-03 ENCOUNTER — Encounter (HOSPITAL_COMMUNITY): Payer: Self-pay

## 2017-11-03 ENCOUNTER — Ambulatory Visit (HOSPITAL_COMMUNITY): Payer: No Typology Code available for payment source

## 2017-11-03 DIAGNOSIS — R29898 Other symptoms and signs involving the musculoskeletal system: Secondary | ICD-10-CM

## 2017-11-03 DIAGNOSIS — M6281 Muscle weakness (generalized): Secondary | ICD-10-CM

## 2017-11-03 DIAGNOSIS — M25561 Pain in right knee: Secondary | ICD-10-CM | POA: Diagnosis not present

## 2017-11-03 DIAGNOSIS — G8929 Other chronic pain: Secondary | ICD-10-CM

## 2017-11-03 NOTE — Therapy (Signed)
[facility] [place], [place], [address] Phone: [phone]   Fax:  [phone]  Physical Therapy Treatment  Patient Details  Name: Tammy Chapman MRN: [id number] Date of Birth: April 02, 1991 Referring Provider: Elsie Saas, MD   Encounter Date: 11/03/2017  PT End of Session - 11/03/17 0817    Visit Number  5    Number of Visits  13    Date for PT Re-Evaluation  11/30/17 minireassess 11/09/17    Authorization Type  [facility] [employer]    Authorization Time Period  10/19/17 to 11/30/17    PT Start Time  0815    PT Stop Time  0856    PT Time Calculation (min)  41 min    Activity Tolerance  Patient tolerated treatment well    Behavior During Therapy  [facility] for tasks assessed/performed       Past Medical History:  Diagnosis Date  . GERD (gastroesophageal reflux disease)   . Loose body of right knee 02/2013  . Mosquito bite 03/08/2013  . Sprain and strain of medial collateral ligament of knee 02/2013   right    Past Surgical History:  Procedure Laterality Date  . CESAREAN SECTION N/A 01/08/2017   Procedure: CESAREAN SECTION;  Surgeon: Jonnie Kind, MD;  Location: [place] [facility];  Service: Obstetrics;  Laterality: N/A;  . CHOLECYSTECTOMY  01/20/2008  . EAR TUBE REMOVAL  2002  . ESOPHAGOGASTRODUODENOSCOPY  05/15/2011   Procedure: ESOPHAGOGASTRODUODENOSCOPY (EGD);  Surgeon: Rogene Houston, MD;  [facility];  Service: Endoscopy;  Laterality: N/A;  3:15   . KNEE ARTHROSCOPY Right 03/15/2013   Procedure: RIGHT  KNEE ARTHROSCOPY, FEMORAL PATELLA REEFING, LATERAL RELEASE, PARTIAL LATERAL MENISCECTOMY ;  Surgeon: Lorn Junes, MD;  Location: [facility] [place];  Service: Orthopedics;  Laterality: Right;  . TONSILLECTOMY AND ADENOIDECTOMY  1999  . TYMPANOSTOMY TUBE PLACEMENT  1999, 2011    There were no vitals filed for this visit.  Subjective Assessment - 11/03/17 0817    Subjective  Pt reports that her knee  is a 4/10 dull achey pain this morning due to the cold weather. She states that overall she can tell a slight improvement since starting therapy, reporting that it is not as achey/throbbing after working all day; no change in strength noticed yet.    Patient Stated Goals  not have her patella give out; get it strong enough    Currently in Pain?  Yes    Pain Score  4     Pain Location  Knee    Pain Orientation  Right    Pain Descriptors / Indicators  Aching;Dull    Pain Type  Chronic pain    Pain Onset  More than a month ago    Pain Frequency  Constant    Aggravating Factors   any WB activities, bending it, touching it    Pain Relieving Factors  unsure    Effect of Pain on Daily Activities  pushes through    Multiple Pain Sites  No            [facility] Adult PT Treatment/Exercise - 11/03/17 0001      Knee/Hip Exercises: Standing   Forward Lunges  Both;1 set;15 reps;Limitations    Forward Lunges Limitations  on BOSU (dome up)    Side Lunges  Both;15 reps;Limitations    Side Lunges Limitations  on BOSU (dome up)    Lateral Step Up  Right;15 reps;Step Height:  6";Limitations    Lateral Step Up Limitations  heel taps    Forward Step Up  Right;15 reps;Step Height: 6";Limitations    Forward Step Up Limitations  +contralateral knee drive    SLS  on 1/2 foam roll upside down 3x10" on RLE; on 1/2 foam roll +palov press with RTB 2x10 on RLE     SLS with Vectors  x5RT on 1/2 foam roll upside down on RLE    Other Standing Knee Exercises  fwd/lat step ups on BOSU x15 each (dome up)      Knee/Hip Exercises: Supine   Short Arc Quad Sets  Right;2 sets;15 reps    Short Arc Quad Sets Limitations  bolster, 1# weight    Single Leg Bridge  Right;2 sets;15 reps on 4" step    Straight Leg Raise with External Rotation  Right;2 sets;10 reps    Straight Leg Raise with External Rotation Limitations  1# ankle weight      Modalities   Modalities  Electrical Stimulation      Electrical Stimulation    Electrical Stimulation Location  R distal quad and R anterior lower leg    Electrical Stimulation Action  TENS    Electrical Stimulation Parameters  Pusle width 250; Frequency: 50Hz     Electrical Stimulation Goals  Pain;Other (comment) desensitization      Manual Therapy   Manual Therapy  Taping    Manual therapy comments  Manual complete separate than rest of tx, bil knee kinesiotape for lateral patella shifting with standing therex    Kinesiotex  -- decrease lateral patella tracking and improve stability           PT Education - 11/03/17 0858    Education provided  Yes    Education Details  taping and when to take tape off; exercise technique, can perform same HEP on LLE that we provide for RLE    Methods  Explanation;Demonstration    Comprehension  Verbalized understanding;Returned demonstration       PT Short Term Goals - 10/19/17 1239      PT SHORT TERM GOAL #1   Title  Pt will be independent with HEP and perform consistently in order to decrease pain and maximize strength.     Time  3    Period  Weeks    Status  New    Target Date  11/09/17      PT SHORT TERM GOAL #2   Title  Pt will have at least 1/2 grade improvement in MMT of all muscle groups tested in order to decrease pain and maximize pt ability to perform functional tasks.    Time  3    Period  Weeks    Status  New        PT Long Term Goals - 10/19/17 1239      PT LONG TERM GOAL #1   Title  Pt will have at least 1 grade improvement in MMT of all muscle groups tested in order to maximize overall function at home and allow pt to hold her child with greater ease.    Time  6    Period  Weeks    Target Date  11/30/17      PT LONG TERM GOAL #2   Title  Pt will be able to perform 5 reps of single let step downs and single leg sit to stands on the RLE with proper form to demonstrate improved functional strength and maximize pt's stair and ladder ambulation in  order to maximize function at work and home.     Time  6    Period  Weeks    Status  New      PT LONG TERM GOAL #3   Title  Pt will report being able to work a full 8-hour shift at work and have 4/10 R knee pain or < following to demonstrate improved funtional strength and endurance.    Time  6    Period  Weeks    Status  New      PT LONG TERM GOAL #4   Title  Pt will have at least 142ft improvement during 3MWT or > with min to no evidence of Trendelenberg sign to demonstrate improved gluteal and functional strength and to maximize pt's ability to work with less pain afterwards.     Time  3    Period  Weeks    Status  New            Plan - 11/03/17 5784    Clinical Impression Statement  Pt overall reporting some decreasing pain at end of day since starting therapy. Continued with application of TENS unit to R knee for pain control and desensitization during therex. Able to add 1# weight to SAQ and SLR with ER with good tolerance. Progressed pt's standing exercises and dynamic stability activities; min cues for proper form required throughout. Otherwise, no pain reported, just fatigue and difficulty noted with therex. She was very challenged with palov press on 1/2 foam roll. Pt reporting that the kinesiotape helped last session so PT reapplied this date; she reported that her L knee cap was feeling unstable too so PT taped it medially as well. Continue as planned, progressing as able.    Rehab Potential  Fair    PT Frequency  2x / week    PT Duration  6 weeks    PT Treatment/Interventions  ADLs/Self Care Home Management;Cryotherapy;Electrical Stimulation;Moist Heat;Ultrasound;Gait training;Stair training;Functional mobility training;Therapeutic activities;Therapeutic exercise;Balance training;Neuromuscular re-education;Patient/family education;Orthotic Fit/Training;Manual techniques;Dry needling;Taping    PT Next Visit Plan  Continue wiht TENS for pain control during therex, answer questions PRN for personal purchases. Add SAQ to HEP  next session and increased dynamic hip stability work. continue to progress standing therex and dynamic stability    PT Home Exercise Plan  eval: bridging, quad set; 3/6: SLR; 3/12: sidelying hip abd, seated march    Consulted and Agree with Plan of Care  Patient       Patient will benefit from skilled therapeutic intervention in order to improve the following deficits and impairments:  Decreased strength, Difficulty walking, Hypermobility, Impaired flexibility, Improper body mechanics, Pain, Impaired sensation  Visit Diagnosis: Chronic pain of right knee  Muscle weakness (generalized)  Other symptoms and signs involving the musculoskeletal system     Problem List Patient Active Problem List   Diagnosis Date Noted  . Chronic ear infection, bilateral 07/03/2016  . 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 Anovulation 02/01/2016  . Knee pain 04/08/2012  . Knee stiffness 04/08/2012  . Difficulty in walking(719.7) 04/08/2012  . Abnormality of gait 04/08/2012  . GERD (gastroesophageal reflux disease) 08/04/2011  . Abdominal pain 08/04/2011  . Diarrhea 08/04/2011       Geraldine Solar PT, DPT  [facility] [address] [place], [place], [address] Phone: [phone]   Fax:  [phone]  Name: Tammy Chapman MRN: [id number] Date of Birth: 1991/07/27

## 2017-11-05 ENCOUNTER — Ambulatory Visit (HOSPITAL_COMMUNITY): Payer: No Typology Code available for payment source | Admitting: Physical Therapy

## 2017-11-05 DIAGNOSIS — G8929 Other chronic pain: Secondary | ICD-10-CM

## 2017-11-05 DIAGNOSIS — M25561 Pain in right knee: Principal | ICD-10-CM

## 2017-11-05 DIAGNOSIS — R29898 Other symptoms and signs involving the musculoskeletal system: Secondary | ICD-10-CM

## 2017-11-05 DIAGNOSIS — M6281 Muscle weakness (generalized): Secondary | ICD-10-CM

## 2017-11-05 NOTE — Therapy (Signed)
[place], [place], [address] Phone: [phone]   Fax:  [phone]  Physical Therapy Treatment  Patient Details  Name: Tammy Chapman MRN: [id number] Date of Birth: 09-Feb-1991 Referring Provider: Elsie Saas, MD   Encounter Date: 11/05/2017  PT End of Session - 11/05/17 1742    Visit Number  6    Number of Visits  13    Date for PT Re-Evaluation  11/30/17 minireassess 11/09/17    Authorization Type  [facility] [employer]    Authorization Time Period  10/19/17 to 11/30/17    PT Start Time  1648    PT Stop Time  1732    PT Time Calculation (min)  44 min    Activity Tolerance  Patient tolerated treatment well    Behavior During Therapy  [facility] for tasks assessed/performed       Past Medical History:  Diagnosis Date  . GERD (gastroesophageal reflux disease)   . Loose body of right knee 02/2013  . Mosquito bite 03/08/2013  . Sprain and strain of medial collateral ligament of knee 02/2013   right    Past Surgical History:  Procedure Laterality Date  . CESAREAN SECTION N/A 01/08/2017   Procedure: CESAREAN SECTION;  Surgeon: Jonnie Kind, MD;  Location: [place];  Service: Obstetrics;  Laterality: N/A;  . CHOLECYSTECTOMY  01/20/2008  . EAR TUBE REMOVAL  2002  . ESOPHAGOGASTRODUODENOSCOPY  05/15/2011   Procedure: ESOPHAGOGASTRODUODENOSCOPY (EGD);  Surgeon: Rogene Houston, MD;  [facility];  Service: Endoscopy;  Laterality: N/A;  3:15   . KNEE ARTHROSCOPY Right 03/15/2013   Procedure: RIGHT  KNEE ARTHROSCOPY, FEMORAL PATELLA REEFING, LATERAL RELEASE, PARTIAL LATERAL MENISCECTOMY ;  Surgeon: Lorn Junes, MD;  Location: [place];  Service: Orthopedics;  Laterality: Right;  . TONSILLECTOMY AND ADENOIDECTOMY  1999  . TYMPANOSTOMY TUBE PLACEMENT  1999, 2011    There were no vitals filed for this visit.  Subjective Assessment - 11/05/17 1655    Subjective  PT states her pain is up  today because of the rain and climbing up and down ladders at work.  6/10 currently.  dull throbbing ache    Currently in Pain?  Yes    Pain Score  6     Pain Location  Knee    Pain Orientation  Right    Pain Descriptors / Indicators  Aching;Dull    Pain Type  Chronic pain                      [facility] Adult PT Treatment/Exercise - 11/05/17 0001      Knee/Hip Exercises: Standing   Forward Lunges  Both;1 set;15 reps;Limitations    Forward Lunges Limitations  on BOSU (dome up)    Side Lunges  Both;15 reps;Limitations    Side Lunges Limitations  on BOSU (dome up)    Lateral Step Up  Right;15 reps;Step Height: 6";Limitations    Lateral Step Up Limitations  heel taps    Forward Step Up  Right;15 reps;Step Height: 6";Limitations    Forward Step Up Limitations  +contralateral knee drive    SLS  on 1/2 foam roll upside down on RLE palov press with RTB 2x10    SLS with Vectors  x5RT on 1/2 foam roll upside down on RLE    Other Standing Knee Exercises  fwd/lat step ups on BOSU x15 each (dome up)  Knee/Hip Exercises: Supine   Short Arc Quad Sets  Right;2 sets;15 reps    Short Arc Quad Sets Limitations  3# weight, neurtral and ER    Single Leg Bridge  Right;2 sets;15 reps    Straight Leg Raises  Right;2 sets;10 reps;Limitations    Straight Leg Raises Limitations  1# ankle weight    Straight Leg Raise with External Rotation  Right;2 sets;10 reps    Straight Leg Raise with External Rotation Limitations  1# ankle weight      Modalities   Modalities  Electrical Stimulation      Electrical Stimulation   Electrical Stimulation Location  R distal quad and R anterior lower leg    Electrical Stimulation Action  TENS    Electrical Stimulation Parameters  Pulse width 250; Frequency: 50Hz     Electrical Stimulation Goals  Pain;Other (comment)      Manual Therapy   Manual Therapy  Taping    Manual therapy comments  Manual complete separate than rest of tx, bil knee kinesiotape for  lateral patella shifting with standing therex    Kinesiotex  Facilitate Muscle decrease lateral patellar tracking               PT Short Term Goals - 10/19/17 1239      PT SHORT TERM GOAL #1   Title  Pt will be independent with HEP and perform consistently in order to decrease pain and maximize strength.     Time  3    Period  Weeks    Status  New    Target Date  11/09/17      PT SHORT TERM GOAL #2   Title  Pt will have at least 1/2 grade improvement in MMT of all muscle groups tested in order to decrease pain and maximize pt ability to perform functional tasks.    Time  3    Period  Weeks    Status  New        PT Long Term Goals - 10/19/17 1239      PT LONG TERM GOAL #1   Title  Pt will have at least 1 grade improvement in MMT of all muscle groups tested in order to maximize overall function at home and allow pt to hold her child with greater ease.    Time  6    Period  Weeks    Target Date  11/30/17      PT LONG TERM GOAL #2   Title  Pt will be able to perform 5 reps of single let step downs and single leg sit to stands on the RLE with proper form to demonstrate improved functional strength and maximize pt's stair and ladder ambulation in order to maximize function at work and home.    Time  6    Period  Weeks    Status  New      PT LONG TERM GOAL #3   Title  Pt will report being able to work a full 8-hour shift at work and have 4/10 R knee pain or < following to demonstrate improved funtional strength and endurance.    Time  6    Period  Weeks    Status  New      PT LONG TERM GOAL #4   Title  Pt will have at least 138ft improvement during 3MWT or > with min to no evidence of Trendelenberg sign to demonstrate improved gluteal and functional strength and to maximize pt's ability to work  with less pain afterwards.     Time  3    Period  Weeks    Status  New            Plan - 11/05/17 1742    Clinical Impression Statement  contiued therex with use of  portable TENS for desensitization and pain control.  Able to increase to 3# with SAQ and also completed with ER.  Pt requires most cues with standing therex for stability and control.  palov on 1/2 roll continues to be most difficult for pateint.  Completed kinesiotaping to improve centralization of patella at EOS.  Pt reported overall comfort and in good positioning.      Rehab Potential  Fair    PT Frequency  2x / week    PT Duration  6 weeks    PT Treatment/Interventions  ADLs/Self Care Home Management;Cryotherapy;Electrical Stimulation;Moist Heat;Ultrasound;Gait training;Stair training;Functional mobility training;Therapeutic activities;Therapeutic exercise;Balance training;Neuromuscular re-education;Patient/family education;Orthotic Fit/Training;Manual techniques;Dry needling;Taping    PT Next Visit Plan  Continue wiht TENS for pain control during therex, answer questions PRN for personal purchases. continue to progress standing therex and dynamic stability.  Attempt squats with BOSU dome side down as well as lunges next session.     PT Home Exercise Plan  eval: bridging, quad set; 3/6: SLR; 3/12: sidelying hip abd, seated march    Consulted and Agree with Plan of Care  Patient       Patient will benefit from skilled therapeutic intervention in order to improve the following deficits and impairments:  Decreased strength, Difficulty walking, Hypermobility, Impaired flexibility, Improper body mechanics, Pain, Impaired sensation  Visit Diagnosis: Chronic pain of right knee  Muscle weakness (generalized)  Other symptoms and signs involving the musculoskeletal system     Problem List Patient Active Problem List   Diagnosis Date Noted  . Chronic ear infection, bilateral 07/03/2016  . 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 Anovulation 02/01/2016  . Knee pain 04/08/2012  . Knee stiffness 04/08/2012  . Difficulty in walking(719.7) 04/08/2012  . Abnormality of gait  04/08/2012  . GERD (gastroesophageal reflux disease) 08/04/2011  . Abdominal pain 08/04/2011  . Diarrhea 08/04/2011    Teena Irani, PTA/CLT [phone]   Teena Irani 11/05/2017, 5:46 PM  [facility] [address] [place], [place], [address] Phone: [phone]   Fax:  [phone]  Name: Tammy Chapman MRN: [id number] Date of Birth: 07/11/1991

## 2017-11-10 ENCOUNTER — Encounter (HOSPITAL_COMMUNITY): Payer: Self-pay

## 2017-11-10 ENCOUNTER — Ambulatory Visit (HOSPITAL_COMMUNITY): Payer: No Typology Code available for payment source

## 2017-11-10 DIAGNOSIS — G8929 Other chronic pain: Secondary | ICD-10-CM

## 2017-11-10 DIAGNOSIS — M6281 Muscle weakness (generalized): Secondary | ICD-10-CM

## 2017-11-10 DIAGNOSIS — R29898 Other symptoms and signs involving the musculoskeletal system: Secondary | ICD-10-CM

## 2017-11-10 DIAGNOSIS — M25561 Pain in right knee: Principal | ICD-10-CM

## 2017-11-10 NOTE — Patient Instructions (Signed)
FUNCTIONAL MOBILITY: Squat    Stance: shoulder-width on floor. Bend hips and knees. Keep back straight. Do not allow knees to bend past toes. Squeeze glutes and quads to stand. 15 reps per set, 1-2 sets per day, 3-4 days per week  Copyright  VHI. All rights reserved.   Forward Lunge    Standing with feet shoulder width apart and stomach tight, step forward with left leg. Repeat 15 times per set. Do 1-2 sets per session.   http://orth.exer.us/1147   Copyright  VHI. All rights reserved.   Band Walk: Side Stepping    Tie band around legs, just above knees. Step 20 feet to one side, then step back to start. Repeat 15 feet per session. Note: Small towel between band and skin eases rubbing.  http://plyo.exer.us/76   Copyright  VHI. All rights reserved.   Climbing Stairs    When climbing stairs, breathe in with first step, breathe out through pursed lips with two or three steps. If you are more short of breath than usual: Breathe in while standing still. Breathe out through pursed lips while stepping up. Stop, then repeat with each step. Take only one step at a time. Do not hold your breath when climbing steps.  Copyright  VHI. All rights reserved.

## 2017-11-10 NOTE — Therapy (Signed)
[place], [place], [address] Phone: [phone]   Fax:  [phone]    I have read and reviewed this note and agree with its findings in its entirety. Pt ready for d/c at this time.  PHYSICAL THERAPY DISCHARGE SUMMARY  Visits from Start of Care: 7  Current functional level related to goals / functional outcomes: See below   Remaining deficits: See below   Education / Equipment: See below Plan: Patient agrees to discharge.  Patient goals were met. Patient is being discharged due to meeting the stated rehab goals.  ?????     Geraldine Solar PT, DPT   Physical Therapy Treatment  Patient Details  Name: Tammy Chapman MRN: [id number] Date of Birth: [birth year] Referring Provider: Elsie Saas, MD   Encounter Date: 11/10/2017  PT End of Session - 11/10/17 1655    Visit Number  7    Number of Visits  13    Date for PT Re-Evaluation  11/30/17 minireassess 11/09/17    Authorization Type  [facility] [employer]    Authorization Time Period  10/19/17 to 11/30/17    PT Start Time  1650    PT Stop Time  1725    PT Time Calculation (min)  35 min    Activity Tolerance  Patient tolerated treatment well    Behavior During Therapy  [facility] for tasks assessed/performed       Past Medical History:  Diagnosis Date  . GERD (gastroesophageal reflux disease)   . Loose body of right knee 02/2013  . Mosquito bite 03/08/2013  . Sprain and strain of medial collateral ligament of knee 02/2013   right    Past Surgical History:  Procedure Laterality Date  . CESAREAN SECTION N/A 01/08/2017   Procedure: CESAREAN SECTION;  Surgeon: Jonnie Kind, MD;  Location: [facility];  Service: Obstetrics;  Laterality: N/A;  . CHOLECYSTECTOMY  01/20/2008  . EAR TUBE REMOVAL  2002  . ESOPHAGOGASTRODUODENOSCOPY  05/15/2011   Procedure: ESOPHAGOGASTRODUODENOSCOPY (EGD);  Surgeon: Rogene Houston, MD;  [facility];  Service:  Endoscopy;  Laterality: N/A;  3:15   . KNEE ARTHROSCOPY Right 03/15/2013   Procedure: RIGHT  KNEE ARTHROSCOPY, FEMORAL PATELLA REEFING, LATERAL RELEASE, PARTIAL LATERAL MENISCECTOMY ;  Surgeon: Lorn Junes, MD;  Location: [facility];  Service: Orthopedics;  Laterality: Right;  . TONSILLECTOMY AND ADENOIDECTOMY  1999  . TYMPANOSTOMY TUBE PLACEMENT  1999, 2011    There were no vitals filed for this visit.  Subjective Assessment - 11/10/17 1653    Subjective  Pt stated her knee feels good today, just stiffness.  Worked everyday and stiffness today.  Reports improvements with the knee taping.      How long can you sit comfortably?  unlimited    How long can you stand comfortably?  continues to shift weight    How long can you walk comfortably?  all day long increased pain with uneven surfaces (was fine on flat surface; inclines/uneven surfaces 15-30 mins)    Patient Stated Goals  not have her patella give out; get it strong enough    Currently in Pain?  No/denies    Pain Descriptors / Indicators  Tightness stiffness         [facility] PT Assessment - 11/10/17 0001      Assessment   Medical Diagnosis  s/p R knee MPFL with weakness VMO, hypersensitivity    Referring Provider  Elsie Saas,  MD    Onset Date/Surgical Date  -- since she was 64    Next MD Visit  11/26/17    Prior Therapy  PT on and off since she was 27 YO for same condition      AROM   AROM Assessment Site  Knee    Right/Left Knee  Right;Left    Right Knee Extension  3 hyperextension    Right Knee Flexion  145    Left Knee Extension  3 hyperextension    Left Knee Flexion  140      Strength   Strength Assessment Site  Hip;Knee;Ankle    Right Hip Flexion  4+/5 was 4-/5    Right Hip Extension  4+/5 was 4/5    Right Hip ABduction  4+/5 was 4-/5    Left Hip Flexion  4+/5    Left Hip Extension  4+/5 was 4+/5    Left Hip ABduction  4+/5 was 4/5    Right Knee Flexion  4/5 was 3+/5    Right Knee Extension   4+/5 was 4/5    Left Knee Flexion  4+/5 was 4+/5    Left Knee Extension  5/5    Right Ankle Dorsiflexion  4+/5 was 4/5    Left Ankle Dorsiflexion  -- 5      Flexibility   Soft Tissue Assessment /Muscle Length  no    Hamstrings  equal      Palpation   Patella mobility  hypermobile med/lat BLE, R>L; difficult to assess sup/inf due to allodynia            No data recorded       [facility] Adult PT Treatment/Exercise - 11/10/17 0001      Knee/Hip Exercises: Standing   Forward Lunges  15 reps    Forward Lunges Limitations  on floor for HEP    Functional Squat  15 reps    Stairs  1RT for HEP    Other Standing Knee Exercises  sidestepping with BTB 39f x1RT             PT Education - 11/10/17 1738    Education provided  Yes    Education Details  Reviewed importance of HEP and advanced HEP; discussed purchase and technqiue for kinesiotaping and TENS unit for knee stability and pain control    Person(s) Educated  Patient    Methods  Explanation;Demonstration;Handout    Comprehension  Verbalized understanding;Returned demonstration       PT Short Term Goals - 11/10/17 1655      PT SHORT TERM GOAL #1   Title  Pt will be independent with HEP and perform consistently in order to decrease pain and maximize strength.     Baseline  03/26:  Reports compliance wiht HEP everyother day    Status  Achieved      PT SHORT TERM GOAL #2   Title  Pt will have at least 1/2 grade improvement in MMT of all muscle groups tested in order to decrease pain and maximize pt ability to perform functional tasks.    Status  Achieved        PT Long Term Goals - 11/10/17 1655      PT LONG TERM GOAL #1   Title  Pt will have at least 1 grade improvement in MMT of all muscle groups tested in order to maximize overall function at home and allow pt to hold her child with greater ease.    Baseline  11/10/17:  Reoprts ability to carry child without issues for greater than a week      PT LONG TERM  GOAL #2   Title  Pt will be able to perform 5 reps of single let step downs and single leg sit to stands on the RLE with proper form to demonstrate improved functional strength and maximize pt's stair and ladder ambulation in order to maximize function at work and home.    Baseline  11/10/17: able to demonstrate 5STS Rt only and 6in step downs without pain today,  cueing to improve weight shifting and reduce valgus, able to demonstrate     Status  Achieved      PT LONG TERM GOAL #3   Title  Pt will report being able to work a full 8-hour shift at work and have 4/10 R knee pain or < following to demonstrate improved funtional strength and endurance.    Baseline  11/10/2017: average pain scale work shift depends on weather, no pain without rain, increases to high dully toothachey pain with rain    Status  Partially Met      PT LONG TERM GOAL #4   Title  Pt will have at least 161f improvement during 3MWT or > with min to no evidence of Trendelenberg sign to demonstrate improved gluteal and functional strength and to maximize pt's ability to work with less pain afterwards.     Baseline  11/10/2017: 3MWT 935 feet (was 612): improved gait mechanics    Status  Achieved            Plan - 11/10/17 1731    Clinical Impression Statement  Minireassessment complete with the following findings:  Pt reports she has been pain free for over a week now and compliant with HEP.  Stated she has been doing a lot of squats at work and feels great with her mechanics and ability to carry son around house without pain.  MMT complete with all musculature at 4+ or 5/5.  Pt able to demonstrate 5 STS with Rt LE only, min cueing to reduce valgus with good mechanics following cueing and ability to step down with good eccentric control noted.  Reviewed kinesiotapeing techiques and places for purchase of TENS unit for pain control.  Pt given advanced HEP.       Rehab Potential  Fair    PT Frequency  2x / week    PT Duration   6 weeks    PT Treatment/Interventions  ADLs/Self Care Home Management;Cryotherapy;Electrical Stimulation;Moist Heat;Ultrasound;Gait training;Stair training;Functional mobility training;Therapeutic activities;Therapeutic exercise;Balance training;Neuromuscular re-education;Patient/family education;Orthotic Fit/Training;Manual techniques;Dry needling;Taping    PT Next Visit Plan  DC to HEP per all goals met/partially met and pt confident with advanced HEP.      PT Home Exercise Plan  eval: bridging, quad set; 3/6: SLR; 3/12: sidelying hip abd, seated march; 11/10/17 squat, lunges, stairs, sidestep       Patient will benefit from skilled therapeutic intervention in order to improve the following deficits and impairments:  Decreased strength, Difficulty walking, Hypermobility, Impaired flexibility, Improper body mechanics, Pain, Impaired sensation  Visit Diagnosis: Chronic pain of right knee  Muscle weakness (generalized)  Other symptoms and signs involving the musculoskeletal system     Problem List Patient Active Problem List   Diagnosis Date Noted  . Chronic ear infection, bilateral 07/03/2016  . 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 Anovulation 02/01/2016  . Knee pain 04/08/2012  . Knee stiffness 04/08/2012  . Difficulty in walking(719.7) 04/08/2012  .  Abnormality of gait 04/08/2012  . GERD (gastroesophageal reflux disease) 08/04/2011  . Abdominal pain 08/04/2011  . Diarrhea 08/04/2011   Ihor Austin, [place]; [place]  Aldona Lento 11/10/2017, 5:41 PM  [facility] [address] [place], [place], [address] Phone: [phone]   Fax:  [phone]  Name: Tammy Chapman MRN: [id number] Date of Birth: 10-11-1990

## 2017-11-12 ENCOUNTER — Encounter (HOSPITAL_COMMUNITY): Payer: No Typology Code available for payment source

## 2017-11-17 ENCOUNTER — Encounter (HOSPITAL_COMMUNITY): Payer: No Typology Code available for payment source

## 2017-11-19 ENCOUNTER — Encounter (HOSPITAL_COMMUNITY): Payer: No Typology Code available for payment source

## 2017-11-24 ENCOUNTER — Encounter (HOSPITAL_COMMUNITY): Payer: No Typology Code available for payment source

## 2017-11-26 ENCOUNTER — Encounter (HOSPITAL_COMMUNITY): Payer: No Typology Code available for payment source

## 2017-12-29 ENCOUNTER — Other Ambulatory Visit (HOSPITAL_COMMUNITY): Payer: Self-pay | Admitting: Respiratory Therapy

## 2017-12-29 DIAGNOSIS — R05 Cough: Secondary | ICD-10-CM

## 2017-12-29 DIAGNOSIS — R059 Cough, unspecified: Secondary | ICD-10-CM

## 2018-01-20 ENCOUNTER — Ambulatory Visit (HOSPITAL_COMMUNITY)
Admission: RE | Admit: 2018-01-20 | Discharge: 2018-01-20 | Disposition: A | Discharge status: Home / Self Care | Payer: No Typology Code available for payment source | Source: Ambulatory Visit | Attending: Pulmonary Disease | Admitting: Pulmonary Disease

## 2018-01-20 DIAGNOSIS — R059 Cough, unspecified: Secondary | ICD-10-CM

## 2018-01-20 DIAGNOSIS — R05 Cough: Secondary | ICD-10-CM | POA: Insufficient documentation

## 2018-01-20 LAB — PULMONARY FUNCTION TEST
FEF 25-75 Post: 4.56 L/sec
FEF 25-75 Pre: 4.29 L/sec
FEF2575-%Change-Post: 6 %
FEF2575-%Pred-Post: 115 %
FEF2575-%Pred-Pre: 108 %
FEV1-%Change-Post: 2 %
FEV1-%Pred-Post: 105 %
FEV1-%Pred-Pre: 102 %
FEV1-Post: 4.06 L
FEV1-Pre: 3.94 L
FEV1FVC-%Change-Post: 1 %
FEV1FVC-%Pred-Pre: 99 %
FEV6-%Change-Post: 1 %
FEV6-%Pred-Post: 104 %
FEV6-%Pred-Pre: 103 %
FEV6-Post: 4.74 L
FEV6-Pre: 4.68 L
FEV6FVC-%Pred-Post: 101 %
FEV6FVC-%Pred-Pre: 101 %
FVC-%Change-Post: 1 %
FVC-%Pred-Post: 104 %
FVC-%Pred-Pre: 102 %
FVC-Post: 4.74 L
FVC-Pre: 4.68 L
Post FEV1/FVC ratio: 86 %
Post FEV6/FVC ratio: 100 %
Pre FEV1/FVC ratio: 84 %
Pre FEV6/FVC Ratio: 100 %

## 2018-01-20 MED ORDER — METHACHOLINE 1 MG/ML NEB SOLN
2.0000 mL | Freq: Once | RESPIRATORY_TRACT | Status: DC
  Filled 2018-01-20: qty 2

## 2018-01-20 MED ORDER — METHACHOLINE 0.25 MG/ML NEB SOLN
2.0000 mL | Freq: Once | RESPIRATORY_TRACT | Status: AC
  Administered 2018-01-20: 0.5 mg via RESPIRATORY_TRACT
  Filled 2018-01-20: qty 2

## 2018-01-20 MED ORDER — ALBUTEROL SULFATE (2.5 MG/3ML) 0.083% IN NEBU
2.5000 mg | INHALATION_SOLUTION | Freq: Once | RESPIRATORY_TRACT | Status: AC
  Administered 2018-01-20: 2.5 mg via RESPIRATORY_TRACT

## 2018-01-20 MED ORDER — METHACHOLINE 0.0625 MG/ML NEB SOLN
2.0000 mL | Freq: Once | RESPIRATORY_TRACT | Status: AC
  Administered 2018-01-20: 0.125 mg via RESPIRATORY_TRACT
  Filled 2018-01-20: qty 2

## 2018-01-20 MED ORDER — METHACHOLINE 4 MG/ML NEB SOLN
2.0000 mL | Freq: Once | RESPIRATORY_TRACT | Status: DC
  Filled 2018-01-20: qty 2

## 2018-01-20 MED ORDER — METHACHOLINE 16 MG/ML NEB SOLN
2.0000 mL | Freq: Once | RESPIRATORY_TRACT | Status: DC
Start: 2018-01-20 — End: 2018-01-21
  Filled 2018-01-20: qty 2

## 2018-01-20 MED ORDER — SODIUM CHLORIDE 0.9 % IN NEBU
3.0000 mL | INHALATION_SOLUTION | Freq: Once | RESPIRATORY_TRACT | Status: AC
  Administered 2018-01-20: 3 mL via RESPIRATORY_TRACT
  Filled 2018-01-20: qty 3

## 2018-01-21 ENCOUNTER — Ambulatory Visit (INDEPENDENT_AMBULATORY_CARE_PROVIDER_SITE_OTHER): Payer: Self-pay | Admitting: Family Medicine

## 2018-01-21 VITALS — BP 105/75 | HR 108 | Temp 98.2°F | Resp 16 | Wt 168.4 lb

## 2018-01-21 DIAGNOSIS — H6692 Otitis media, unspecified, left ear: Secondary | ICD-10-CM

## 2018-01-21 DIAGNOSIS — R197 Diarrhea, unspecified: Secondary | ICD-10-CM

## 2018-01-21 MED ORDER — DOXYCYCLINE HYCLATE 100 MG PO TABS: 100.0000 mg | ORAL_TABLET | Freq: Two times a day (BID) | ORAL | 0 refills | Status: DC

## 2018-01-21 NOTE — Progress Notes (Signed)
Tammy Chapman is a 27 y.o. female who presents today with concerns of fever, body aches and chills x 2 days and new onset diarrhea x 1 day.  Review of Systems  Constitutional: Positive for chills and fever. Negative for malaise/fatigue.  HENT: Negative for congestion, ear discharge, ear pain, sinus pain and sore throat.   Eyes: Negative.   Respiratory: Negative for cough, sputum production and shortness of breath.   Cardiovascular: Negative.  Negative for chest pain.  Gastrointestinal: Positive for diarrhea. Negative for abdominal pain, nausea and vomiting.  Genitourinary: Negative for dysuria, frequency, hematuria and urgency.  Musculoskeletal: Negative for myalgias.  Skin: Negative.   Neurological: Negative for headaches.  Endo/Heme/Allergies: Negative.   Psychiatric/Behavioral: Negative.     O: Vitals:   01/21/18 0840  BP: 105/75  Pulse: (!) 108  Resp: 16  Temp: 98.2 F (36.8 C)  SpO2: 99%     Physical Exam  Constitutional: She is oriented to person, place, and time. Vital signs are normal. She appears well-developed and well-nourished. She is active.  Non-toxic appearance. She does not have a sickly appearance.  HENT:  Head: Normocephalic.  Right Ear: Hearing, tympanic membrane, external ear and ear canal normal.  Left Ear: Hearing, external ear and ear canal normal. Tympanic membrane is injected and erythematous.  Nose: Nose normal.  Mouth/Throat: Uvula is midline and oropharynx is clear and moist.  Tube present in left ear  Neck: Normal range of motion. Neck supple.  Cardiovascular: Normal rate, regular rhythm, normal heart sounds and normal pulses.  Pulmonary/Chest: Effort normal and breath sounds normal.  Abdominal: Soft. Bowel sounds are normal.  Musculoskeletal: Normal range of motion.  Lymphadenopathy:       Head (right side): No submental and no submandibular adenopathy present.       Head (left side): No submental and no submandibular adenopathy present.   She has no cervical adenopathy.  Neurological: She is alert and oriented to person, place, and time.  Psychiatric: She has a normal mood and affect.  Vitals reviewed.    A: 1. Left otitis media, unspecified otitis media type   2. Diarrhea, unspecified type      P: 72 hour work note provided. Exam findings, diagnosis etiology and medication use and indications reviewed with patient. Follow- Up and discharge instructions provided. No emergent/urgent issues found on exam.  Patient verbalized understanding of information provided and agrees with plan of care (POC), all questions answered.  1. Left otitis media, unspecified otitis media type - doxycycline (VIBRA-TABS) 100 MG tablet; Take 1 tablet (100 mg total) by mouth 2 (two) times daily.  2. Diarrhea, unspecified type Advised on dietary choices to decrease loose stools/

## 2018-01-21 NOTE — Patient Instructions (Signed)
Food Choices to Help Relieve Diarrhea, Adult  When you have diarrhea, the foods you eat and your eating habits are very important. Choosing the right foods and drinks can help:  · Relieve diarrhea.  · Replace lost fluids and nutrients.  · Prevent dehydration.    What general guidelines should I follow?  Relieving diarrhea  · Choose foods with less than 2 g or .07 oz. of fiber per serving.  · Limit fats to less than 8 tsp (38 g or 1.34 oz.) a day.  · Avoid the following:  ? Foods and beverages sweetened with high-fructose corn syrup, honey, or sugar alcohols such as xylitol, sorbitol, and mannitol.  ? Foods that contain a lot of fat or sugar.  ? Fried, greasy, or spicy foods.  ? High-fiber grains, breads, and cereals.  ? Raw fruits and vegetables.  · Eat foods that are rich in probiotics. These foods include dairy products such as yogurt and fermented milk products. They help increase healthy bacteria in the stomach and intestines (gastrointestinal tract, or GI tract).  · If you have lactose intolerance, avoid dairy products. These may make your diarrhea worse.  · Take medicine to help stop diarrhea (antidiarrheal medicine) only as told by your health care provider.  Replacing nutrients  · Eat small meals or snacks every 3–4 hours.  · Eat bland foods, such as white rice, toast, or baked potato, until your diarrhea starts to get better. Gradually reintroduce nutrient-rich foods as tolerated or as told by your health care provider. This includes:  ? Well-cooked protein foods.  ? Peeled, seeded, and soft-cooked fruits and vegetables.  ? Low-fat dairy products.  · Take vitamin and mineral supplements as told by your health care provider.  Preventing dehydration    · Start by sipping water or a special solution to prevent dehydration (oral rehydration solution, ORS). Urine that is clear or pale yellow means that you are getting enough fluid.  · Try to drink at least 8–10 cups of fluid each day to help replace lost  fluids.  · You may add other liquids in addition to water, such as clear juice or decaffeinated sports drinks, as tolerated or as told by your health care provider.  · Avoid drinks with caffeine, such as coffee, tea, or soft drinks.  · Avoid alcohol.  What foods are recommended?  The items listed may not be a complete list. Talk with your health care provider about what dietary choices are best for you.  Grains  White rice. White, French, or pita breads (fresh or toasted), including plain rolls, buns, or bagels. White pasta. Saltine, soda, or Cassandra Mcmanaman crackers. Pretzels. Low-fiber cereal. Cooked cereals made with water (such as cornmeal, farina, or cream cereals). Plain muffins. Matzo. Melba toast. Zwieback.  Vegetables  Potatoes (without the skin). Most well-cooked and canned vegetables without skins or seeds. Tender lettuce.  Fruits  Apple sauce. Fruits canned in juice. Cooked apricots, cherries, grapefruit, peaches, pears, or plums. Fresh bananas and cantaloupe.  Meats and other protein foods  Baked or boiled chicken. Eggs. Tofu. Fish. Seafood. Smooth nut butters. Ground or well-cooked tender beef, ham, veal, lamb, pork, or poultry.  Dairy  Plain yogurt, kefir, and unsweetened liquid yogurt. Lactose-free milk, buttermilk, skim milk, or soy milk. Low-fat or nonfat hard cheese.  Beverages  Water. Low-calorie sports drinks. Fruit juices without pulp. Strained tomato and vegetable juices. Decaffeinated teas. Sugar-free beverages not sweetened with sugar alcohols. Oral rehydration solutions, if approved by your health care   provider.  Seasoning and other foods  Bouillon, broth, or soups made from recommended foods.  What foods are not recommended?  The items listed may not be a complete list. Talk with your health care provider about what dietary choices are best for you.  Grains  Whole grain, whole wheat, bran, or rye breads, rolls, pastas, and crackers. Wild or brown rice. Whole grain or bran cereals. Barley. Oats  and oatmeal. Corn tortillas or taco shells. Granola. Popcorn.  Vegetables  Raw vegetables. Fried vegetables. Cabbage, broccoli, Brussels sprouts, artichokes, baked beans, beet greens, corn, kale, legumes, peas, sweet potatoes, and yams. Potato skins. Cooked spinach and cabbage.  Fruits  Dried fruit, including raisins and dates. Raw fruits. Stewed or dried prunes. Canned fruits with syrup.  Meat and other protein foods  Fried or fatty meats. Deli meats. Chunky nut butters. Nuts and seeds. Beans and lentils. Bacon. Hot dogs. Sausage.  Dairy  High-fat cheeses. Whole milk, chocolate milk, and beverages made with milk, such as milk shakes. Half-and-half. Cream. sour cream. Ice cream.  Beverages  Caffeinated beverages (such as coffee, tea, soda, or energy drinks). Alcoholic beverages. Fruit juices with pulp. Prune juice. Soft drinks sweetened with high-fructose corn syrup or sugar alcohols. High-calorie sports drinks.  Fats and oils  Butter. Cream sauces. Margarine. Salad oils. Plain salad dressings. Olives. Avocados. Mayonnaise.  Sweets and desserts  Sweet rolls, doughnuts, and sweet breads. Sugar-free desserts sweetened with sugar alcohols such as xylitol and sorbitol.  Seasoning and other foods  Honey. Hot sauce. Chili powder. Gravy. Cream-based or milk-based soups. Pancakes and waffles.  Summary  · When you have diarrhea, the foods you eat and your eating habits are very important.  · Make sure you get at least 8–10 cups of fluid each day, or enough to keep your urine clear or pale yellow.  · Eat bland foods and gradually reintroduce healthy, nutrient-rich foods as tolerated, or as told by your health care provider.  · Avoid high-fiber, fried, greasy, or spicy foods.  This information is not intended to replace advice given to you by your health care provider. Make sure you discuss any questions you have with your health care provider.  Document Released: 10/25/2003 Document Revised: 08/01/2016 Document  Reviewed: 08/01/2016  Elsevier Interactive Patient Education © 2018 [employer]

## 2018-02-09 MED FILL — SYMBICORT 160-4.5 MCG INH: 160-4.5 | 30 days supply | Qty: 10 | Fill #0

## 2018-05-21 ENCOUNTER — Encounter (HOSPITAL_COMMUNITY): Payer: Self-pay

## 2018-07-12 MED FILL — SYMBICORT 160-4.5 MCG INH: 160-4.5 | 30 days supply | Qty: 10 | Fill #1

## 2018-07-17 ENCOUNTER — Telehealth: Payer: No Typology Code available for payment source | Admitting: Family

## 2018-07-17 DIAGNOSIS — J208 Acute bronchitis due to other specified organisms: Secondary | ICD-10-CM

## 2018-07-17 DIAGNOSIS — B9689 Other specified bacterial agents as the cause of diseases classified elsewhere: Secondary | ICD-10-CM

## 2018-07-17 MED ORDER — PREDNISONE 10 MG (21) PO TBPK: ORAL_TABLET | ORAL | 0 refills | Status: DC

## 2018-07-17 MED ORDER — DOXYCYCLINE HYCLATE 100 MG PO TABS: 100.0000 mg | ORAL_TABLET | Freq: Two times a day (BID) | ORAL | 0 refills | Status: DC

## 2018-07-17 NOTE — Progress Notes (Signed)
We are sorry that you are not feeling well.  Here is how we plan to help!  Based on your presentation I believe you most likely have A cough due to bacteria.  When patients have a fever and a productive cough with a change in color or increased sputum production, we are concerned about bacterial bronchitis.  If left untreated it can progress to pneumonia.  If your symptoms do not improve with your treatment plan it is important that you contact your provider.   I have prescribed Doxycycline 100 mg twice a day for 7 days     In addition you may use A non-prescription cough medication called Mucinex DM: take 2 tablets every 12 hours.  Prednisone 10 mg daily for 6 days (see taper instructions below)  From your responses in the eVisit questionnaire you describe inflammation in the upper respiratory tract which is causing a significant cough.  This is commonly called Bronchitis and has four common causes:    Allergies  Viral Infections  Acid Reflux  Bacterial Infection Allergies, viruses and acid reflux are treated by controlling symptoms or eliminating the cause. An example might be a cough caused by taking certain blood pressure medications. You stop the cough by changing the medication. Another example might be a cough caused by acid reflux. Controlling the reflux helps control the cough.  USE OF BRONCHODILATOR ("RESCUE") INHALERS: There is a risk from using your bronchodilator too frequently.  The risk is that over-reliance on a medication which only relaxes the muscles surrounding the breathing tubes can reduce the effectiveness of medications prescribed to reduce swelling and congestion of the tubes themselves.  Although you feel brief relief from the bronchodilator inhaler, your asthma may actually be worsening with the tubes becoming more swollen and filled with mucus.  This can delay other crucial treatments, such as oral steroid medications. If you need to use a bronchodilator inhaler daily,  several times per day, you should discuss this with your provider.  There are probably better treatments that could be used to keep your asthma under control.     HOME CARE . Only take medications as instructed by your medical team. . Complete the entire course of an antibiotic. . Drink plenty of fluids and get plenty of rest. . Avoid close contacts especially the very young and the elderly . Cover your mouth if you cough or cough into your sleeve. . Always remember to wash your hands . A steam or ultrasonic humidifier can help congestion.   GET HELP RIGHT AWAY IF: . You develop worsening fever. . You become short of breath . You cough up blood. . Your symptoms persist after you have completed your treatment plan MAKE SURE YOU   Understand these instructions.  Will watch your condition.  Will get help right away if you are not doing well or get worse.  Your e-visit answers were reviewed by a board certified advanced clinical practitioner to complete your personal care plan.  Depending on the condition, your plan could have included both over the counter or prescription medications. If there is a problem please reply  once you have received a response from your provider. Your safety is important to [place].  If you have drug allergies check your prescription carefully.    You can use MyChart to ask questions about today's visit, request a non-urgent call back, or ask for a work or school excuse for 24 hours related to this e-Visit. If it has been greater than  24 hours you will need to follow up with your provider, or enter a new e-Visit to address those concerns. You will get an e-mail in the next two days asking about your experience.  I hope that your e-visit has been valuable and will speed your recovery. Thank you for using e-visits.

## 2018-07-23 DIAGNOSIS — K210 Gastro-esophageal reflux disease with esophagitis: Secondary | ICD-10-CM | POA: Diagnosis not present

## 2018-07-23 DIAGNOSIS — G43909 Migraine, unspecified, not intractable, without status migrainosus: Secondary | ICD-10-CM | POA: Diagnosis not present

## 2018-07-23 DIAGNOSIS — J209 Acute bronchitis, unspecified: Secondary | ICD-10-CM | POA: Diagnosis not present

## 2018-07-23 DIAGNOSIS — J42 Unspecified chronic bronchitis: Secondary | ICD-10-CM | POA: Diagnosis not present

## 2018-07-23 MED FILL — PANTOPRAZOLE SOD DR 40 MG T: 40 | 90 days supply | Qty: 90 | Fill #0

## 2018-07-23 MED FILL — TOPIRAMATE 50 MG TABLET: 50 | 90 days supply | Qty: 180 | Fill #0

## 2018-07-23 MED FILL — PROAIR HFA 90 MCG INHALER: 108 (90 BAS | 25 days supply | Qty: 9 | Fill #0

## 2018-07-28 ENCOUNTER — Telehealth: Payer: No Typology Code available for payment source | Admitting: Nurse Practitioner

## 2018-07-28 DIAGNOSIS — B373 Candidiasis of vulva and vagina: Secondary | ICD-10-CM | POA: Diagnosis not present

## 2018-07-28 DIAGNOSIS — B3731 Acute candidiasis of vulva and vagina: Secondary | ICD-10-CM

## 2018-07-28 MED ORDER — FLUCONAZOLE 150 MG PO TABS: 150.0000 mg | ORAL_TABLET | Freq: Once | ORAL | 0 refills | Status: AC

## 2018-07-28 NOTE — Progress Notes (Signed)

## 2018-08-05 MED FILL — SYMBICORT 160-4.5 MCG INH: 160-4.5 | 30 days supply | Qty: 10 | Fill #2

## 2018-08-18 ENCOUNTER — Telehealth: Payer: No Typology Code available for payment source | Admitting: Family

## 2018-08-18 DIAGNOSIS — B373 Candidiasis of vulva and vagina: Secondary | ICD-10-CM

## 2018-08-18 DIAGNOSIS — B3731 Acute candidiasis of vulva and vagina: Secondary | ICD-10-CM

## 2018-08-18 MED ORDER — FLUCONAZOLE 150 MG PO TABS: 150.0000 mg | ORAL_TABLET | ORAL | 0 refills | Status: DC | PRN

## 2018-08-18 NOTE — Progress Notes (Signed)

## 2018-09-09 ENCOUNTER — Telehealth: Payer: No Typology Code available for payment source | Admitting: Family

## 2018-09-09 DIAGNOSIS — Z719 Counseling, unspecified: Secondary | ICD-10-CM

## 2018-09-09 NOTE — Progress Notes (Signed)
Thank you for the details you included in the comment boxes. Those details are very helpful in determining the best course of treatment for you and help [place] to provide the best care. Given the seriousness of your condition and additional complications, you must be seen face-to-face. I reviewed  Your chart records, which date back 12 years and there is 1 documented ear infection at [facility] in which we gave you oral antibiotics. If we had detailed records, that may sometimes allow [place] to be more flexible with what we treat within the e-visit program. At this point, a physical exam should be performed to provide you with the best care possible.   Based on what you shared with me it looks like you have a serious condition that should be evaluated in a face to face office visit.  NOTE: If you entered your credit card information for this eVisit, you will not be charged. You may see a "hold" on your card for the $30 but that hold will drop off and you will not have a charge processed.  If you are having a true medical emergency please call 911.  If you need an urgent face to face visit, [place] has four urgent care centers for your convenience.  If you need care fast and have a high deductible or no insurance consider:   [email or web address] to reserve your spot online an avoid wait times  [facility] [address], [address] [place], [place] [address] 8 am to 8 pm Monday-Friday 10 am to 4 pm Saturday-Sunday *Across the street from [employer]  [place], [address] 8 am to 5 pm Monday-Friday * In the [facility] on the [facility]   The following sites will take your  insurance:  . [facility] [place] a Provider at this Location  [address] [place], [place] [address] . 10 am to 8 pm Monday-Friday . 12 pm to 8 pm Saturday-Sunday   . [facility] at [place] a Provider at this Location  [address] [place], [place], [place] [address] . 8 am to 8 pm Monday-Friday . 9 am to 6 pm Saturday . 11 am to 6 pm Sunday   . [facility] at [place] Get Driving Directions  [address] Suite [place], [place] [address] . 8 am to 8 pm Monday-Friday . 8 am to 4 pm Saturday-Sunday   Your e-visit answers were reviewed by a board certified advanced clinical practitioner to complete your personal care plan.  Thank you for using e-Visits.

## 2018-10-01 MED FILL — SYMBICORT 160-4.5 MCG INH: 160-4.5 | 30 days supply | Qty: 10 | Fill #3

## 2018-11-23 ENCOUNTER — Telehealth: Payer: Self-pay | Admitting: *Deleted

## 2018-11-23 NOTE — Telephone Encounter (Signed)
Called patient and left detailed message that we need to change her visit tomorrow to a telephone visit.

## 2018-11-24 ENCOUNTER — Ambulatory Visit (INDEPENDENT_AMBULATORY_CARE_PROVIDER_SITE_OTHER): Payer: 59 | Admitting: Adult Health

## 2018-11-24 ENCOUNTER — Other Ambulatory Visit: Payer: Self-pay

## 2018-11-24 ENCOUNTER — Encounter: Payer: Self-pay | Admitting: Adult Health

## 2018-11-24 DIAGNOSIS — O3680X0 Pregnancy with inconclusive fetal viability, not applicable or unspecified: Secondary | ICD-10-CM

## 2018-11-24 DIAGNOSIS — Z3201 Encounter for pregnancy test, result positive: Secondary | ICD-10-CM

## 2018-11-24 DIAGNOSIS — Z3A09 9 weeks gestation of pregnancy: Secondary | ICD-10-CM | POA: Insufficient documentation

## 2018-11-24 MED ORDER — PRENATAL PLUS 27-1 MG PO TABS: 1.0000 | ORAL_TABLET | Freq: Every day | ORAL | 12 refills | Status: DC

## 2018-11-24 MED ORDER — DOXYLAMINE-PYRIDOXINE 10-10 MG PO TBEC: DELAYED_RELEASE_TABLET | ORAL | 6 refills | Status: DC

## 2018-11-24 NOTE — Progress Notes (Signed)
Patient ID: Tammy Chapman, female   DOB: 1991-03-18, 28 y.o.   MRN: [id number]   TELEHEALTH VIRTUAL GYNECOLOGY VISIT ENCOUNTER NOTE  I connected with Tammy Chapman on 11/24/18 at  9:45 AM EDT by telephone at home and verified that I am speaking with the correct person using two identifiers.   I discussed the limitations, risks, security and privacy concerns of performing an evaluation and management service by telephone and the availability of in person appointments. I also discussed with the patient that there may be a patient responsible charge related to this service. The patient expressed understanding and agreed to proceed.   History:  Tammy Chapman is a 28 y.o. G46P1001 female being evaluated today for having missed 2 periods and has had 2+HPTs. She is about 9 weeks by LMP with EDD 06/29/2019.  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     Past Medical History:  Diagnosis Date  . GERD (gastroesophageal reflux disease)   . Loose body of right knee 02/2013  . Mosquito bite 03/08/2013  . Sprain and strain of medial collateral ligament of knee 02/2013   right   Past Surgical History:  Procedure Laterality Date  . CESAREAN SECTION N/A 01/08/2017   Procedure: CESAREAN SECTION;  Surgeon: Jonnie Kind, MD;  Location: [place];  Service: Obstetrics;  Laterality: N/A;  . CHOLECYSTECTOMY  01/20/2008  . EAR TUBE REMOVAL  2002  . ESOPHAGOGASTRODUODENOSCOPY  05/15/2011   Procedure: ESOPHAGOGASTRODUODENOSCOPY (EGD);  Surgeon: Rogene Houston, MD;  [facility];  Service: Endoscopy;  Laterality: N/A;  3:15   . KNEE ARTHROSCOPY Right 03/15/2013   Procedure: RIGHT  KNEE ARTHROSCOPY, FEMORAL PATELLA REEFING, LATERAL RELEASE, PARTIAL LATERAL MENISCECTOMY ;  Surgeon: Lorn Junes, MD;  Location: [place];   Service: Orthopedics;  Laterality: Right;  . TONSILLECTOMY AND ADENOIDECTOMY  1999  . TYMPANOSTOMY TUBE PLACEMENT  1999, 2011   The following portions of the patient's history were reviewed and updated as appropriate: allergies, current medications, past family history, past medical history, past social history, past surgical history and problem list.   Health Maintenance:  Normal pap 06/04/16.    Review of Systems:  Pertinent items noted in HPI and remainder of comprehensive ROS otherwise negative.  Physical Exam:   General:  Alert, oriented and cooperative.   Mental Status: Normal mood and affect perceived. Normal judgment and thought content.  Physical exam deferred due to nature of the encounter  Labs and Imaging No results found for this or any previous visit (from the past 336 hour(s)). No results found.    Assessment and Plan:     1. Positive pregnancy test      2+HPTs  2. [redacted] weeks gestation of pregnancy       Meds ordered this encounter  Medications  . prenatal vitamin w/FE, FA (PRENATAL 1 + 1) 27-1 MG TABS tablet    Sig: Take 1 tablet by mouth daily at 12 noon.    Dispense:  30 each    Refill:  12    Order Specific Question:   Supervising Provider    Answer:   Elonda Husky, LUTHER H [[id number]]  . Doxylamine-Pyridoxine 10-10 MG TBEC    Sig: Take 1 in am, 1 in pm and 2 at HS    Dispense:  60  tablet    Refill:  6    Order Specific Question:   Supervising Provider    Answer:   Tania Ade H [[id number]]  Eat often  3. Encounter to determine fetal viability of pregnancy, single or unspecified fetus - US OB Comp Less 14 Wks; in 1 week, she declines IT/NT testing, at this time       I discussed the assessment and treatment plan with the patient. The patient was provided an opportunity to ask questions and all were answered. The patient agreed with the plan and demonstrated an understanding of the instructions.   The patient was advised to call back or seek an in-person  evaluation/go to the ED if the symptoms worsen or if the condition fails to improve as anticipated.  I provided 10 minutes of non-face-to-face time during this encounter.   Derrek Monaco, NP Center for [employer], [place]

## 2018-12-01 ENCOUNTER — Other Ambulatory Visit: Payer: Self-pay

## 2018-12-01 ENCOUNTER — Ambulatory Visit (INDEPENDENT_AMBULATORY_CARE_PROVIDER_SITE_OTHER): Payer: 59

## 2018-12-01 DIAGNOSIS — O3680X0 Pregnancy with inconclusive fetal viability, not applicable or unspecified: Secondary | ICD-10-CM | POA: Diagnosis not present

## 2018-12-01 DIAGNOSIS — Z3A08 8 weeks gestation of pregnancy: Secondary | ICD-10-CM

## 2018-12-01 NOTE — Progress Notes (Signed)
[place] 8+5 wks,single IUP w/ ys,positive fht 171 bpm,normal ovaries bilat,crl 21.29 mm

## 2018-12-06 ENCOUNTER — Ambulatory Visit: Payer: 59 | Admitting: *Deleted

## 2018-12-06 ENCOUNTER — Encounter: Payer: 59 | Admitting: Advanced Practice Midwife

## 2018-12-06 ENCOUNTER — Telehealth: Payer: Self-pay | Admitting: Adult Health

## 2018-12-06 NOTE — Telephone Encounter (Signed)
Pt states that she cannot get in touch with her PCP and is needing a antibiotic for a double ear infection and she is wanting to see if this can be sent in to [facility] in [place] or [place]?

## 2018-12-06 NOTE — Telephone Encounter (Signed)
Spoke with pt. Pt has chronic ear infections. Ear is draining. She knows she has an infection. Pt is 9-[redacted] weeks pregnant. Can you order med? Thanks!! [place]

## 2018-12-07 MED ORDER — AZITHROMYCIN 250 MG PO TABS: ORAL_TABLET | ORAL | 0 refills | Status: DC

## 2018-12-15 DIAGNOSIS — G43909 Migraine, unspecified, not intractable, without status migrainosus: Secondary | ICD-10-CM | POA: Diagnosis not present

## 2018-12-15 DIAGNOSIS — R05 Cough: Secondary | ICD-10-CM | POA: Diagnosis not present

## 2018-12-17 DIAGNOSIS — G43909 Migraine, unspecified, not intractable, without status migrainosus: Secondary | ICD-10-CM | POA: Diagnosis not present

## 2018-12-17 DIAGNOSIS — R05 Cough: Secondary | ICD-10-CM | POA: Diagnosis not present

## 2018-12-28 ENCOUNTER — Encounter: Payer: Self-pay | Admitting: *Deleted

## 2018-12-29 ENCOUNTER — Encounter: Payer: Self-pay | Admitting: Women's Health

## 2018-12-29 ENCOUNTER — Ambulatory Visit (INDEPENDENT_AMBULATORY_CARE_PROVIDER_SITE_OTHER): Payer: 59 | Admitting: Women's Health

## 2018-12-29 ENCOUNTER — Ambulatory Visit: Payer: 59 | Admitting: *Deleted

## 2018-12-29 ENCOUNTER — Other Ambulatory Visit: Payer: Self-pay

## 2018-12-29 VITALS — BP 126/81 | HR 86 | Wt 191.0 lb

## 2018-12-29 DIAGNOSIS — Z8759 Personal history of other complications of pregnancy, childbirth and the puerperium: Secondary | ICD-10-CM

## 2018-12-29 DIAGNOSIS — Z3A12 12 weeks gestation of pregnancy: Secondary | ICD-10-CM

## 2018-12-29 DIAGNOSIS — Z331 Pregnant state, incidental: Secondary | ICD-10-CM

## 2018-12-29 DIAGNOSIS — Z3490 Encounter for supervision of normal pregnancy, unspecified, unspecified trimester: Secondary | ICD-10-CM | POA: Insufficient documentation

## 2018-12-29 DIAGNOSIS — Z3481 Encounter for supervision of other normal pregnancy, first trimester: Secondary | ICD-10-CM

## 2018-12-29 DIAGNOSIS — Z1389 Encounter for screening for other disorder: Secondary | ICD-10-CM

## 2018-12-29 DIAGNOSIS — Z363 Encounter for antenatal screening for malformations: Secondary | ICD-10-CM

## 2018-12-29 DIAGNOSIS — Z98891 History of uterine scar from previous surgery: Secondary | ICD-10-CM | POA: Insufficient documentation

## 2018-12-29 LAB — POCT URINALYSIS DIPSTICK OB
Blood, UA: NEGATIVE
Glucose, UA: NEGATIVE
Ketones, UA: NEGATIVE
Leukocytes, UA: NEGATIVE
Nitrite, UA: NEGATIVE
POC,PROTEIN,UA: NEGATIVE

## 2018-12-29 MED ORDER — PROMETHAZINE HCL 25 MG PO TABS: 12.5000 mg | ORAL_TABLET | Freq: Four times a day (QID) | ORAL | 0 refills | Status: DC | PRN

## 2018-12-29 NOTE — Patient Instructions (Signed)
Tammy Chapman, I greatly value your feedback.  If you receive a survey following your visit with [place] today, we appreciate you taking the time to fill it out.  Thanks, Knute Neu, CNM, [facility]  [place]!!! It is now [facility] ([place], [place] [address]) Entrance located off of [address] parking   Begin taking 162mg  (two 81mg  tablets) baby aspirin daily to decrease risk of preeclampsia during pregnancy     Nausea & Vomiting  Have saltine crackers or pretzels by your bed and eat a few bites before you raise your head out of bed in the morning  Eat small frequent meals throughout the day instead of large meals  Drink plenty of fluids throughout the day to stay hydrated, just don't drink a lot of fluids with your meals.  This can make your stomach fill up faster making you feel sick  Do not brush your teeth right after you eat  Products with real ginger are good for nausea, like ginger ale and ginger hard candy Make sure it says made with real ginger!  Sucking on sour candy like lemon heads is also good for nausea  If your prenatal vitamins make you nauseated, take them at night so you will sleep through the nausea  Sea Bands  If you feel like you need medicine for the nausea & vomiting please let us know  If you are unable to keep any fluids or food down please let us know   Constipation  Drink plenty of fluid, preferably water, throughout the day  Eat foods high in fiber such as fruits, vegetables, and grains  Exercise, such as walking, is a good way to keep your bowels regular  Drink warm fluids, especially warm prune juice, or decaf coffee  Eat a 1/2 cup of real oatmeal (not instant), 1/2 cup applesauce, and 1/2-1 cup warm prune juice every day  If needed, you may take Colace (docusate sodium) stool softener once or twice a day to help keep the stool soft. If you are pregnant, wait until  you are out of your first trimester (12-14 weeks of pregnancy)  If you still are having problems with constipation, you may take Miralax once daily as needed to help keep your bowels regular.  If you are pregnant, wait until you are out of your first trimester (12-14 weeks of pregnancy)   Home Blood Pressure Monitoring for Patients   Your provider has recommended that you check your blood pressure (BP) at least once a week at home. If you do not have a blood pressure cuff at home, one will be provided for you. Contact your provider if you have not received your monitor within 1 week.   Helpful Tips for Accurate Home Blood Pressure Checks  . Don't smoke, exercise, or drink caffeine 30 minutes before checking your BP . Use the restroom before checking your BP (a full bladder can raise your pressure) . Relax in a comfortable upright chair . Feet on the ground . Left arm resting comfortably on a flat surface at the level of your heart . Legs uncrossed . Back supported . Sit quietly and don't talk . Place the cuff on your bare arm . Adjust snuggly, so that only two fingertips can fit between your skin and the top of the cuff . Check 2 readings separated by at least one minute . Keep a log of your BP readings .  For a visual, please reference this diagram: http://ccnc.care/bpdiagram  Provider Name: [facility]     Phone: [phone]  Zone 1: ALL CLEAR  Continue to monitor your symptoms:  . BP reading is less than 140 (top number) or less than 90 (bottom number)  . No right upper stomach pain . No headaches or seeing spots . No feeling nauseated or throwing up . No swelling in face and hands  Zone 2: CAUTION Call your doctor's office for any of the following:  . BP reading is greater than 140 (top number) or greater than 90 (bottom number)  . Stomach pain under your ribs in the middle or right side . Headaches or seeing spots . Feeling nauseated or throwing up . Swelling in  face and hands  Zone 3: EMERGENCY  Seek immediate medical care if you have any of the following:  . BP reading is greater than160 (top number) or greater than 110 (bottom number) . Severe headaches not improving with Tylenol . Serious difficulty catching your breath . Any worsening symptoms from Zone 2    First Trimester of Pregnancy The first trimester of pregnancy is from week 1 until the end of week 12 (months 1 through 3). A week after a sperm fertilizes an egg, the egg will implant on the wall of the uterus. This embryo will begin to develop into a baby. Genes from you and your partner are forming the baby. The female genes determine whether the baby is a boy or a girl. At 6-8 weeks, the eyes and face are formed, and the heartbeat can be seen on ultrasound. At the end of 12 weeks, all the baby's organs are formed.  Now that you are pregnant, you will want to do everything you can to have a healthy baby. Two of the most important things are to get good prenatal care and to follow your health care provider's instructions. Prenatal care is all the medical care you receive before the baby's birth. This care will help prevent, find, and treat any problems during the pregnancy and childbirth. BODY CHANGES Your body goes through many changes during pregnancy. The changes vary from woman to woman.   You may gain or lose a couple of pounds at first.  You may feel sick to your stomach (nauseous) and throw up (vomit). If the vomiting is uncontrollable, call your health care provider.  You may tire easily.  You may develop headaches that can be relieved by medicines approved by your health care provider.  You may urinate more often. Painful urination may mean you have a bladder infection.  You may develop heartburn as a result of your pregnancy.  You may develop constipation because certain hormones are causing the muscles that push waste through your intestines to slow down.  You may develop  hemorrhoids or swollen, bulging veins (varicose veins).  Your breasts may begin to grow larger and become tender. Your nipples may stick out more, and the tissue that surrounds them (areola) may become darker.  Your gums may bleed and may be sensitive to brushing and flossing.  Dark spots or blotches (chloasma, mask of pregnancy) may develop on your face. This will likely fade after the baby is born.  Your menstrual periods will stop.  You may have a loss of appetite.  You may develop cravings for certain kinds of food.  You may have changes in your emotions from day to day, such as being excited to be pregnant or being concerned  that something may go wrong with the pregnancy and baby.  You may have more vivid and strange dreams.  You may have changes in your hair. These can include thickening of your hair, rapid growth, and changes in texture. Some women also have hair loss during or after pregnancy, or hair that feels dry or thin. Your hair will most likely return to normal after your baby is born. WHAT TO EXPECT AT YOUR PRENATAL VISITS During a routine prenatal visit:  You will be weighed to make sure you and the baby are growing normally.  Your blood pressure will be taken.  Your abdomen will be measured to track your baby's growth.  The fetal heartbeat will be listened to starting around week 10 or 12 of your pregnancy.  Test results from any previous visits will be discussed. Your health care provider may ask you:  How you are feeling.  If you are feeling the baby move.  If you have had any abnormal symptoms, such as leaking fluid, bleeding, severe headaches, or abdominal cramping.  If you have any questions. Other tests that may be performed during your first trimester include:  Blood tests to find your blood type and to check for the presence of any previous infections. They will also be used to check for low iron levels (anemia) and Rh antibodies. Later in the  pregnancy, blood tests for diabetes will be done along with other tests if problems develop.  Urine tests to check for infections, diabetes, or protein in the urine.  An ultrasound to confirm the proper growth and development of the baby.  An amniocentesis to check for possible genetic problems.  Fetal screens for spina bifida and Down syndrome.  You may need other tests to make sure you and the baby are doing well. HOME CARE INSTRUCTIONS  Medicines  Follow your health care provider's instructions regarding medicine use. Specific medicines may be either safe or unsafe to take during pregnancy.  Take your prenatal vitamins as directed.  If you develop constipation, try taking a stool softener if your health care provider approves. Diet  Eat regular, well-balanced meals. Choose a variety of foods, such as meat or vegetable-based protein, fish, milk and low-fat dairy products, vegetables, fruits, and whole grain breads and cereals. Your health care provider will help you determine the amount of weight gain that is right for you.  Avoid raw meat and uncooked cheese. These carry germs that can cause birth defects in the baby.  Eating four or five small meals rather than three large meals a day may help relieve nausea and vomiting. If you start to feel nauseous, eating a few soda crackers can be helpful. Drinking liquids between meals instead of during meals also seems to help nausea and vomiting.  If you develop constipation, eat more high-fiber foods, such as fresh vegetables or fruit and whole grains. Drink enough fluids to keep your urine clear or pale yellow. Activity and Exercise  Exercise only as directed by your health care provider. Exercising will help you:  Control your weight.  Stay in shape.  Be prepared for labor and delivery.  Experiencing pain or cramping in the lower abdomen or low back is a good sign that you should stop exercising. Check with your health care  provider before continuing normal exercises.  Try to avoid standing for long periods of time. Move your legs often if you must stand in one place for a long time.  Avoid heavy lifting.  Wear low-heeled  shoes, and practice good posture.  You may continue to have sex unless your health care provider directs you otherwise. Relief of Pain or Discomfort  Wear a good support bra for breast tenderness.    Take warm sitz baths to soothe any pain or discomfort caused by hemorrhoids. Use hemorrhoid cream if your health care provider approves.    Rest with your legs elevated if you have leg cramps or low back pain.  If you develop varicose veins in your legs, wear support hose. Elevate your feet for 15 minutes, 3-4 times a day. Limit salt in your diet. Prenatal Care  Schedule your prenatal visits by the twelfth week of pregnancy. They are usually scheduled monthly at first, then more often in the last 2 months before delivery.  Write down your questions. Take them to your prenatal visits.  Keep all your prenatal visits as directed by your health care provider. Safety  Wear your seat belt at all times when driving.  Make a list of emergency phone numbers, including numbers for family, friends, the hospital, and police and fire departments. General Tips  Ask your health care provider for a referral to a local prenatal education class. Begin classes no later than at the beginning of month 6 of your pregnancy.  Ask for help if you have counseling or nutritional needs during pregnancy. Your health care provider can offer advice or refer you to specialists for help with various needs.  Do not use hot tubs, steam rooms, or saunas.  Do not douche or use tampons or scented sanitary pads.  Do not cross your legs for long periods of time.  Avoid cat litter boxes and soil used by cats. These carry germs that can cause birth defects in the baby and possibly loss of the fetus by miscarriage or  stillbirth.  Avoid all smoking, herbs, alcohol, and medicines not prescribed by your health care provider. Chemicals in these affect the formation and growth of the baby.  Schedule a dentist appointment. At home, brush your teeth with a soft toothbrush and be gentle when you floss. SEEK MEDICAL CARE IF:   You have dizziness.  You have mild pelvic cramps, pelvic pressure, or nagging pain in the abdominal area.  You have persistent nausea, vomiting, or diarrhea.  You have a bad smelling vaginal discharge.  You have pain with urination.  You notice increased swelling in your face, hands, legs, or ankles. SEEK IMMEDIATE MEDICAL CARE IF:   You have a fever.  You are leaking fluid from your vagina.  You have spotting or bleeding from your vagina.  You have severe abdominal cramping or pain.  You have rapid weight gain or loss.  You vomit blood or material that looks like coffee grounds.  You are exposed to [place] measles and have never had them.  You are exposed to fifth disease or chickenpox.  You develop a severe headache.  You have shortness of breath.  You have any kind of trauma, such as from a fall or a car accident. Document Released: 07/29/2001 Document Revised: 12/19/2013 Document Reviewed: 06/14/2013 [facility] Patient Information 2015 [place], [place]. This information is not intended to replace advice given to you by your health care provider. Make sure you discuss any questions you have with your health care provider.  Coronavirus (COVID-19) Are you at risk?  Are you at risk for the Coronavirus (COVID-19)?  To be considered HIGH RISK for Coronavirus (COVID-19), you have to meet the following criteria:  . Traveled  to [place], [place], [place], [place] or [place]; or in the [place] to [place], [place], [place], or [place]; and have fever, cough, and shortness of breath within the last 2 weeks of travel OR . Been in close contact with a person diagnosed  with COVID-19 within the last 2 weeks and have fever, cough, and shortness of breath . IF YOU DO NOT MEET THESE CRITERIA, YOU ARE CONSIDERED LOW RISK FOR COVID-19.  What to do if you are HIGH RISK for COVID-19?  Marland Kitchen If you are having a medical emergency, call 911. . Seek medical care right away. Before you go to a doctor's office, urgent care or emergency department, call ahead and tell them about your recent travel, contact with someone diagnosed with COVID-19, and your symptoms. You should receive instructions from your physician's office regarding next steps of care.  . When you arrive at healthcare provider, tell the healthcare staff immediately you have returned from visiting [place], [place], [place], [place] or [place]; or traveled in the [place] to [employer], [place], [place], or [place]; in the last two weeks or you have been in close contact with a person diagnosed with COVID-19 in the last 2 weeks.   . Tell the health care staff about your symptoms: fever, cough and shortness of breath. . After you have been seen by a medical provider, you will be either: o Tested for (COVID-19) and discharged home on quarantine except to seek medical care if symptoms worsen, and asked to  - Stay home and avoid contact with others until you get your results (4-5 days)  - Avoid travel on public transportation if possible (such as bus, train, or airplane) or o Sent to the Emergency Department by EMS for evaluation, COVID-19 testing, and possible admission depending on your condition and test results.  What to do if you are LOW RISK for COVID-19?  Reduce your risk of any infection by using the same precautions used for avoiding the common cold or flu:  Marland Kitchen Wash your hands often with soap and warm water for at least 20 seconds.  If soap and water are not readily available, use an alcohol-based hand sanitizer with at least 60% alcohol.  . If coughing or sneezing, cover your mouth and nose by coughing  or sneezing into the elbow areas of your shirt or coat, into a tissue or into your sleeve (not your hands). . Avoid shaking hands with others and consider head nods or verbal greetings only. . Avoid touching your eyes, nose, or mouth with unwashed hands.  . Avoid close contact with people who are sick. . Avoid places or events with large numbers of people in one location, like concerts or sporting events. . Carefully consider travel plans you have or are making. . If you are planning any travel outside or inside the [place], visit the CDC's Travelers' Health webpage for the latest health notices. . If you have some symptoms but not all symptoms, continue to monitor at home and seek medical attention if your symptoms worsen. . If you are having a medical emergency, call 911.   [place] / e-Visit: [email or web address]         [facility]: [facility]: [phone]                   [facility]: [phone]

## 2018-12-29 NOTE — Progress Notes (Signed)
INITIAL OBSTETRICAL VISIT Patient name: Tammy Chapman MRN [id number]  Date of birth: 1991/04/26 Chief Complaint:   Initial Prenatal Visit  History of Present Illness:   Tammy Chapman is a 28 y.o. G75P1001 Caucasian female at [redacted]w[redacted]d by 8wk u/s with an Estimated Date of Delivery: 07/08/19 being seen today for her initial obstetrical visit.   Her obstetrical history is significant for failed IOL for GHTN at term, never progressed past 5cm.   Today she reports nausea, diclegis not really helping.  Patient's last menstrual period was 09/22/2018 (exact date). Last pap 06/04/16. Results were: normal Review of Systems:   Pertinent items are noted in HPI Denies cramping/contractions, leakage of fluid, vaginal bleeding, abnormal vaginal discharge w/ itching/odor/irritation, headaches, visual changes, shortness of breath, chest pain, abdominal pain, severe nausea/vomiting, or problems with urination or bowel movements unless otherwise stated above.  Pertinent History Reviewed:  Reviewed past medical,surgical, social, obstetrical and family history.  Reviewed problem list, medications and allergies. OB History  Gravida Para Term Preterm AB Living  2 1 1     1   SAB TAB Ectopic Multiple Live Births          1    # Outcome Date GA Lbr Len/2nd Weight Sex Delivery Anes PTL Lv  2 Current           1 Term 01/08/17 [redacted]w[redacted]d  8 lb 11.9 oz (3.965 kg) M CS-LTranv EPI  LIV     Complications: Gestational hypertension   Physical Assessment:   Vitals:   12/29/18 1434  BP: 126/81  Pulse: 86  Weight: 191 lb (86.6 kg)  Body mass index is 27.41 kg/m.       Physical Examination:  General appearance - well appearing, and in no distress  Mental status - alert, oriented to person, place, and time  Psych:  She has a normal mood and affect  Skin - warm and dry, normal color, no suspicious lesions noted  Chest - effort normal, all lung fields clear to auscultation bilaterally  Heart - normal rate and regular  rhythm  Abdomen - soft, nontender  Extremities:  No swelling or varicosities noted  Thin prep pap is not done  Fetal Heart Rate (bpm): 161 via doppler  Results for orders placed or performed in visit on 12/29/18 (from the past 24 hour(s))  POC Urinalysis Dipstick OB   Collection Time: 12/29/18  2:43 PM  Result Value Ref Range   Color, UA     Clarity, UA     Glucose, UA Negative Negative   Bilirubin, UA     Ketones, UA neg    Spec Grav, UA     Blood, UA neg    pH, UA     POC,PROTEIN,UA Negative Negative, Trace, Small (1+), Moderate (2+), Large (3+), 4+   Urobilinogen, UA     Nitrite, UA neg    Leukocytes, UA Negative Negative   Appearance     Odor     Assessment & Plan:  1) Low-Risk Pregnancy G2P1001 at [redacted]w[redacted]d with an Estimated Date of Delivery: 07/08/19   2) Initial OB visit  3) H/O GHTN> ASA 162 mg daily, baseline labs today  4) Previous c/s> for failed IOL, wants RCS  5) Nausea> rx phenergan  Meds:  Meds ordered this encounter  Medications  . promethazine (PHENERGAN) 25 MG tablet    Sig: Take 0.5-1 tablets (12.5-25 mg total) by mouth every 6 (six) hours as needed for nausea or vomiting.  Dispense:  30 tablet    Refill:  0    Order Specific Question:   Supervising Provider    Answer:   Florian Buff [[id number]]    Initial labs obtained Continue prenatal vitamins Reviewed n/v relief measures and warning s/s to report Reviewed recommended weight gain based on pre-gravid BMI Encouraged well-balanced diet Genetic Screening discussed: declined Cystic fibrosis, SMA, Fragile X screening discussed declined Ultrasound discussed; fetal survey: requested CCNC completed>PCM not here, form faxed Has home bp cuff, check weekly, let us know if >140/90  Follow-up: Return in about 5 weeks (around 02/04/2019) for [place].   Orders Placed This Encounter  Procedures  . GC/Chlamydia Probe Amp  . Urine Culture  . US OB Comp + 14 Wk  . Obstetric Panel, Including HIV   . Sickle cell screen  . Urinalysis, Routine w reflex microscopic  . Comprehensive metabolic panel  . Protein / creatinine ratio, urine  . POC Urinalysis Dipstick OB    Roma Schanz CNM, [facility] 12/29/2018 3:41 PM

## 2018-12-31 LAB — URINE CULTURE

## 2019-01-01 LAB — GC/CHLAMYDIA PROBE AMP
Chlamydia trachomatis, NAA: NEGATIVE
Neisseria Gonorrhoeae by PCR: NEGATIVE

## 2019-01-06 MED FILL — SYMBICORT 160-4.5 MCG INH: 160-4.5 | 30 days supply | Qty: 10 | Fill #0

## 2019-01-10 ENCOUNTER — Inpatient Hospital Stay (HOSPITAL_COMMUNITY)
Admission: AD | Admit: 2019-01-10 | Discharge: 2019-01-10 | Disposition: A | Discharge status: Home / Self Care | Payer: 59 | Source: Ambulatory Visit | Attending: Obstetrics & Gynecology | Admitting: Obstetrics & Gynecology

## 2019-01-10 ENCOUNTER — Other Ambulatory Visit: Payer: Self-pay

## 2019-01-10 ENCOUNTER — Encounter (HOSPITAL_COMMUNITY): Payer: Self-pay

## 2019-01-10 DIAGNOSIS — O99612 Diseases of the digestive system complicating pregnancy, second trimester: Secondary | ICD-10-CM | POA: Insufficient documentation

## 2019-01-10 DIAGNOSIS — Z79899 Other long term (current) drug therapy: Secondary | ICD-10-CM | POA: Insufficient documentation

## 2019-01-10 DIAGNOSIS — E860 Dehydration: Secondary | ICD-10-CM | POA: Diagnosis not present

## 2019-01-10 DIAGNOSIS — Z3A14 14 weeks gestation of pregnancy: Secondary | ICD-10-CM | POA: Insufficient documentation

## 2019-01-10 DIAGNOSIS — R000 Tachycardia, unspecified: Secondary | ICD-10-CM | POA: Diagnosis not present

## 2019-01-10 DIAGNOSIS — R079 Chest pain, unspecified: Secondary | ICD-10-CM | POA: Diagnosis not present

## 2019-01-10 DIAGNOSIS — J45909 Unspecified asthma, uncomplicated: Secondary | ICD-10-CM | POA: Insufficient documentation

## 2019-01-10 DIAGNOSIS — O99282 Endocrine, nutritional and metabolic diseases complicating pregnancy, second trimester: Secondary | ICD-10-CM | POA: Insufficient documentation

## 2019-01-10 DIAGNOSIS — O26892 Other specified pregnancy related conditions, second trimester: Secondary | ICD-10-CM | POA: Diagnosis not present

## 2019-01-10 DIAGNOSIS — R002 Palpitations: Secondary | ICD-10-CM | POA: Insufficient documentation

## 2019-01-10 DIAGNOSIS — O99512 Diseases of the respiratory system complicating pregnancy, second trimester: Secondary | ICD-10-CM | POA: Insufficient documentation

## 2019-01-10 DIAGNOSIS — K219 Gastro-esophageal reflux disease without esophagitis: Secondary | ICD-10-CM | POA: Insufficient documentation

## 2019-01-10 LAB — CBC
HCT: 40 % (ref 36.0–46.0)
Hemoglobin: 13.6 g/dL (ref 12.0–15.0)
MCH: 28.7 pg (ref 26.0–34.0)
MCHC: 34 g/dL (ref 30.0–36.0)
MCV: 84.4 fL (ref 80.0–100.0)
Platelets: 182 10*3/uL (ref 150–400)
RBC: 4.74 MIL/uL (ref 3.87–5.11)
RDW: 13.1 % (ref 11.5–15.5)
WBC: 9.8 10*3/uL (ref 4.0–10.5)
nRBC: 0 % (ref 0.0–0.2)

## 2019-01-10 LAB — BASIC METABOLIC PANEL
Anion gap: 10 (ref 5–15)
BUN: 7 mg/dL (ref 6–20)
CO2: 22 mmol/L (ref 22–32)
Calcium: 8.6 mg/dL — ABNORMAL LOW (ref 8.9–10.3)
Chloride: 102 mmol/L (ref 98–111)
Creatinine, Ser: 0.47 mg/dL (ref 0.44–1.00)
GFR calc Af Amer: >60 mL/min (ref >60)
GFR calc non Af Amer: >60 mL/min (ref >60)
Glucose, Bld: 99 mg/dL (ref 70–99)
Potassium: 3.8 mmol/L (ref 3.5–5.1)
Sodium: 134 mmol/L — ABNORMAL LOW (ref 135–145)

## 2019-01-10 LAB — URINALYSIS, ROUTINE W REFLEX MICROSCOPIC
Bacteria, UA: NONE SEEN
Bilirubin Urine: NEGATIVE
Glucose, UA: NEGATIVE mg/dL
Ketones, ur: NEGATIVE mg/dL
Leukocytes,Ua: NEGATIVE
Nitrite: NEGATIVE
Protein, ur: NEGATIVE mg/dL
Specific Gravity, Urine: 1.024 (ref 1.005–1.030)
pH: 5 (ref 5.0–8.0)

## 2019-01-10 LAB — TSH: TSH: 0.486 u[IU]/mL (ref 0.350–4.500)

## 2019-01-10 NOTE — Discharge Instructions (Signed)
Second Trimester of Pregnancy  The second trimester is from week 14 through week 27 (month 4 through 6). This is often the time in pregnancy that you feel your best. Often times, morning sickness has lessened or quit. You may have more energy, and you may get hungry more often. Your unborn baby is growing rapidly. At the end of the sixth month, he or she is about 9 inches long and weighs about 1 pounds. You will likely feel the baby move between 18 and 20 weeks of pregnancy. Follow these instructions at home: Medicines  Take over-the-counter and prescription medicines only as told by your doctor. Some medicines are safe and some medicines are not safe during pregnancy.  Take a prenatal vitamin that contains at least 600 micrograms (mcg) of folic acid.  If you have trouble pooping (constipation), take medicine that will make your stool soft (stool softener) if your doctor approves. Eating and drinking   Eat regular, healthy meals.  Avoid raw meat and uncooked cheese.  If you get low calcium from the food you eat, talk to your doctor about taking a daily calcium supplement.  Avoid foods that are high in fat and sugars, such as fried and sweet foods.  If you feel sick to your stomach (nauseous) or throw up (vomit): ? Eat 4 or 5 small meals a day instead of 3 large meals. ? Try eating a few soda crackers. ? Drink liquids between meals instead of during meals.  To prevent constipation: ? Eat foods that are high in fiber, like fresh fruits and vegetables, whole grains, and beans. ? Drink enough fluids to keep your pee (urine) clear or pale yellow. Activity  Exercise only as told by your doctor. Stop exercising if you start to have cramps.  Do not exercise if it is too hot, too humid, or if you are in a place of great height (high altitude).  Avoid heavy lifting.  Wear low-heeled shoes. Sit and stand up straight.  You can continue to have sex unless your doctor tells you not  to. Relieving pain and discomfort  Wear a good support bra if your breasts are tender.  Take warm water baths (sitz baths) to soothe pain or discomfort caused by hemorrhoids. Use hemorrhoid cream if your doctor approves.  Rest with your legs raised if you have leg cramps or low back pain.  If you develop puffy, bulging veins (varicose veins) in your legs: ? Wear support hose or compression stockings as told by your doctor. ? Raise (elevate) your feet for 15 minutes, 3-4 times a day. ? Limit salt in your food. Prenatal care  Write down your questions. Take them to your prenatal visits.  Keep all your prenatal visits as told by your doctor. This is important. Safety  Wear your seat belt when driving.  Make a list of emergency phone numbers, including numbers for family, friends, the hospital, and police and fire departments. General instructions  Ask your doctor about the right foods to eat or for help finding a counselor, if you need these services.  Ask your doctor about local prenatal classes. Begin classes before month 6 of your pregnancy.  Do not use hot tubs, steam rooms, or saunas.  Do not douche or use tampons or scented sanitary pads.  Do not cross your legs for long periods of time.  Visit your dentist if you have not done so. Use a soft toothbrush to brush your teeth. Floss gently.  Avoid all smoking, herbs,   and alcohol. Avoid drugs that are not approved by your doctor.  Do not use any products that contain nicotine or tobacco, such as cigarettes and e-cigarettes. If you need help quitting, ask your doctor.  Avoid cat litter boxes and soil used by cats. These carry germs that can cause birth defects in the baby and can cause a loss of your baby (miscarriage) or stillbirth. Contact a doctor if:  You have mild cramps or pressure in your lower belly.  You have pain when you pee (urinate).  You have bad smelling fluid coming from your vagina.  You continue to  feel sick to your stomach (nauseous), throw up (vomit), or have watery poop (diarrhea).  You have a nagging pain in your belly area.  You feel dizzy. Get help right away if:  You have a fever.  You are leaking fluid from your vagina.  You have spotting or bleeding from your vagina.  You have severe belly cramping or pain.  You lose or gain weight rapidly.  You have trouble catching your breath and have chest pain.  You notice sudden or extreme puffiness (swelling) of your face, hands, ankles, feet, or legs.  You have not felt the baby move in over an hour.  You have severe headaches that do not go away when you take medicine.  You have trouble seeing. Summary  The second trimester is from week 14 through week 27 (months 4 through 6). This is often the time in pregnancy that you feel your best.  To take care of yourself and your unborn baby, you will need to eat healthy meals, take medicines only if your doctor tells you to do so, and do activities that are safe for you and your baby.  Call your doctor if you get sick or if you notice anything unusual about your pregnancy. Also, call your doctor if you need help with the right food to eat, or if you want to know what activities are safe for you. This information is not intended to replace advice given to you by your health care provider. Make sure you discuss any questions you have with your health care provider. Document Released: 10/29/2009 Document Revised: 09/09/2016 Document Reviewed: 09/09/2016 Elsevier Interactive Patient Education  2019 [employer] Inc.   Dehydration, Adult  Dehydration is when there is not enough fluid or water in your body. This happens when you lose more fluids than you take in. Dehydration can range from mild to very bad. It should be treated right away to keep it from getting very bad. Symptoms of mild dehydration may include:  Thirst.  Dry lips.  Slightly dry mouth.  Dry, warm  skin.  Dizziness. Symptoms of moderate dehydration may include:  Very dry mouth.  Muscle cramps.  Dark pee (urine). Pee may be the color of tea.  Your body making less pee.  Your eyes making fewer tears.  Heartbeat that is uneven or faster than normal (palpitations).  Headache.  Light-headedness, especially when you stand up from sitting.  Fainting (syncope). Symptoms of very bad dehydration may include:  Changes in skin, such as: ? Cold and clammy skin. ? Blotchy (mottled) or pale skin. ? Skin that does not quickly return to normal after being lightly pinched and let go (poor skin turgor).  Changes in body fluids, such as: ? Feeling very thirsty. ? Your eyes making fewer tears. ? Not sweating when body temperature is high, such as in hot weather. ? Your body making very little  pee.  Changes in vital signs, such as: ? Weak pulse. ? Pulse that is more than 100 beats a minute when you are sitting still. ? Fast breathing. ? Low blood pressure.  Other changes, such as: ? Sunken eyes. ? Cold hands and feet. ? Confusion. ? Lack of energy (lethargy). ? Trouble waking up from sleep. ? Short-term weight loss. ? Unconsciousness. Follow these instructions at home:   If told by your doctor, drink an ORS: ? Make an ORS by using instructions on the package. ? Start by drinking small amounts, about  cup (120 mL) every 5-10 minutes. ? Slowly drink more until you have had the amount that your doctor said to have.  Drink enough clear fluid to keep your pee clear or pale yellow. If you were told to drink an ORS, finish the ORS first, then start slowly drinking clear fluids. Drink fluids such as: ? Water. Do not drink only water by itself. Doing that can make the salt (sodium) level in your body get too low (hyponatremia). ? Ice chips. ? Fruit juice that you have added water to (diluted). ? Low-calorie sports drinks.  Avoid: ? Alcohol. ? Drinks that have a lot of sugar.  These include high-calorie sports drinks, fruit juice that does not have water added, and soda. ? Caffeine. ? Foods that are greasy or have a lot of fat or sugar.  Take over-the-counter and prescription medicines only as told by your doctor.  Do not take salt tablets. Doing that can make the salt level in your body get too high (hypernatremia).  Eat foods that have minerals (electrolytes). Examples include bananas, oranges, potatoes, tomatoes, and spinach.  Keep all follow-up visits as told by your doctor. This is important. Contact a doctor if:  You have belly (abdominal) pain that: ? Gets worse. ? Stays in one area (localizes).  You have a rash.  You have a stiff neck.  You get angry or annoyed more easily than normal (irritability).  You are more sleepy than normal.  You have a harder time waking up than normal.  You feel: ? Weak. ? Dizzy. ? Very thirsty.  You have peed (urinated) only a small amount of very dark pee during 6-8 hours. Get help right away if:  You have symptoms of very bad dehydration.  You cannot drink fluids without throwing up (vomiting).  Your symptoms get worse with treatment.  You have a fever.  You have a very bad headache.  You are throwing up or having watery poop (diarrhea) and it: ? Gets worse. ? Does not go away.  You have blood or something green (bile) in your throw-up.  You have blood in your poop (stool). This may cause poop to look black and tarry.  You have not peed in 6-8 hours.  You pass out (faint).  Your heart rate when you are sitting still is more than 100 beats a minute.  You have trouble breathing. This information is not intended to replace advice given to you by your health care provider. Make sure you discuss any questions you have with your health care provider. Document Released: 05/31/2009 Document Revised: 02/22/2016 Document Reviewed: 09/28/2015 Elsevier Interactive Patient Education  2019 [employer].

## 2019-01-10 NOTE — [facility] Provider Note (Addendum)
History     CSN: [id number]  Arrival date and time: 01/10/19 1033   Chief Complaint  Patient presents with  . Palpitations   G2P1001 @14 .3 wks presenting with racing heartbeat. Reports sx started 1 week ago. Describes as fast heartbeat, does not usually skip a beat. Sx occur intermittent, not every day. Nothing seems to induce sx, can occur when she is resting. At times it is accompanied by hot flahes, SOB and/or lightheadedness. No syncope. She is eating and drinking well, had 1 bottle of water today but usually drinks more. Hx of asthma which has been well controlled with Symbicort and occasionally uses rescue inhaler. No fevers. She admits to increased stress recently, just got married and moved.   OB History    Gravida  2   Para  1   Term  1   Preterm      AB      Living  1     SAB      TAB      Ectopic      Multiple      Live Births  1           Past Medical History:  Diagnosis Date  . Asthma   . GERD (gastroesophageal reflux disease)   . Loose body of right knee 02/2013  . Mosquito bite 03/08/2013  . Sprain and strain of medial collateral ligament of knee 02/2013   right    Past Surgical History:  Procedure Laterality Date  . CESAREAN SECTION N/A 01/08/2017   Procedure: CESAREAN SECTION;  Surgeon: Jonnie Kind, MD;  Location: [facility];  Service: Obstetrics;  Laterality: N/A;  . CHOLECYSTECTOMY  01/20/2008  . EAR TUBE REMOVAL  2002  . ESOPHAGOGASTRODUODENOSCOPY  05/15/2011   Procedure: ESOPHAGOGASTRODUODENOSCOPY (EGD);  Surgeon: Rogene Houston, MD;  [facility];  Service: Endoscopy;  Laterality: N/A;  3:15   . KNEE ARTHROSCOPY Right 03/15/2013   Procedure: RIGHT  KNEE ARTHROSCOPY, FEMORAL PATELLA REEFING, LATERAL RELEASE, PARTIAL LATERAL MENISCECTOMY ;  Surgeon: Lorn Junes, MD;  Location: [facility];  Service: Orthopedics;  Laterality: Right;  . TONSILLECTOMY AND ADENOIDECTOMY  1999  . TYMPANOSTOMY TUBE  PLACEMENT  1999, 2011    Family History  Problem Relation Age of Onset  . Diabetes Mother   . Cancer Paternal Grandfather        renal  . Diabetes Maternal Grandfather   . COPD Maternal Grandfather   . Congestive Heart Failure Maternal Grandfather   . Eclampsia Sister   . Eclampsia Maternal Aunt   . Eclampsia Maternal Aunt   . Eclampsia Maternal Aunt     Social History   Tobacco Use  . Smoking status: Never Smoker  . Smokeless tobacco: Never Used  Substance Use Topics  . Alcohol use: No  . Drug use: No    Allergies:  Allergies  Allergen Reactions  . Benadryl [Diphenhydramine Hcl] Shortness Of Breath    Pt states that she feels fine if she sits down, but if she is moving around after taking benadryl, she feels SOB  . Penicillins Shortness Of Breath    Has patient had a PCN reaction causing immediate rash, facial/tongue/throat swelling, SOB or lightheadedness with hypotension: yes Has patient had a PCN reaction causing severe rash involving mucus membranes or skin necrosis: no Has patient had a PCN reaction that required hospitalization: no Has patient had a PCN reaction occurring within the last 10 years: no If all  of the above answers are "NO", then may proceed with Cephalosporin use.   . Adhesive [Tape] Hives  . Percocet [Oxycodone-Acetaminophen]   . Lactose Intolerance (Gi) Diarrhea and Nausea And Vomiting  . Latex Rash  . Sulfa Antibiotics Rash  . Ultram [Tramadol Hcl] Rash    Medications Prior to Admission  Medication Sig Dispense Refill Last Dose  . acetaminophen (TYLENOL) 325 MG tablet Take 650 mg by mouth every 6 (six) hours as needed for moderate pain.   Not Taking  . cetirizine (ZYRTEC) 10 MG tablet Take 10 mg by mouth daily.   Not Taking  . Doxylamine-Pyridoxine 10-10 MG TBEC Take 1 in am, 1 in pm and 2 at HS 60 tablet 6 Taking  . esomeprazole (NEXIUM) 20 MG capsule Take 20 mg by mouth daily as needed (heartburn).   Taking  . predniSONE (STERAPRED  UNI-PAK 21 TAB) 10 MG (21) TBPK tablet As directed 21 tablet 0 Taking  . prenatal vitamin w/FE, FA (PRENATAL 1 + 1) 27-1 MG TABS tablet Take 1 tablet by mouth daily at 12 noon. 30 each 12 Taking  . PROAIR HFA 108 (90 Base) MCG/ACT inhaler   0 Taking  . promethazine (PHENERGAN) 25 MG tablet Take 0.5-1 tablets (12.5-25 mg total) by mouth every 6 (six) hours as needed for nausea or vomiting. 30 tablet 0   . SYMBICORT 160-4.5 MCG/ACT inhaler   12 Taking    Review of Systems  Constitutional: Negative for fever.  Respiratory: Positive for shortness of breath. Negative for chest tightness.   Cardiovascular: Negative for chest pain and palpitations.  Gastrointestinal: Negative for abdominal pain.  Genitourinary: Negative for vaginal bleeding.  Neurological: Positive for light-headedness. Negative for syncope.   Physical Exam   Blood pressure 116/71, pulse 94, temperature 98.2 F (36.8 C), temperature source Oral, resp. rate 20, weight 87.5 kg, last menstrual period 09/22/2018, SpO2 100 %.  Physical Exam  Nursing note and vitals reviewed. Constitutional: She is oriented to person, place, and time. She appears well-developed and well-nourished. No distress.  HENT:  Head: Normocephalic and atraumatic.  Neck: Normal range of motion.  Cardiovascular: Normal rate, regular rhythm and normal heart sounds.  Respiratory: Effort normal and breath sounds normal. No respiratory distress. She has no wheezes. She has no rales.  Musculoskeletal: Normal range of motion.  Neurological: She is alert and oriented to person, place, and time.  Skin: Skin is warm and dry.  Psychiatric: Her mood appears anxious (slightly).  FHT 145  Results for orders placed or performed during the hospital encounter of 01/10/19 (from the past 24 hour(s))  Urinalysis, Routine w reflex microscopic     Status: Abnormal   Collection Time: 01/10/19 10:43 AM  Result Value Ref Range   Color, Urine YELLOW YELLOW   APPearance CLEAR  CLEAR   Specific Gravity, Urine 1.024 1.005 - 1.030   pH 5.0 5.0 - 8.0   Glucose, UA NEGATIVE NEGATIVE mg/dL   Hgb urine dipstick SMALL (A) NEGATIVE   Bilirubin Urine NEGATIVE NEGATIVE   Ketones, ur NEGATIVE NEGATIVE mg/dL   Protein, ur NEGATIVE NEGATIVE mg/dL   Nitrite NEGATIVE NEGATIVE   Leukocytes,Ua NEGATIVE NEGATIVE   WBC, UA 0-5 0 - 5 WBC/hpf   Bacteria, UA NONE SEEN NONE SEEN   Squamous Epithelial / LPF 0-5 0 - 5   Mucus PRESENT   CBC     Status: None   Collection Time: 01/10/19 11:42 AM  Result Value Ref Range   WBC 9.8 4.0 -  10.5 K/uL   RBC 4.74 3.87 - 5.11 MIL/uL   Hemoglobin 13.6 12.0 - 15.0 g/dL   HCT 40.0 36.0 - 46.0 %   MCV 84.4 80.0 - 100.0 fL   MCH 28.7 26.0 - 34.0 pg   MCHC 34.0 30.0 - 36.0 g/dL   RDW 13.1 11.5 - 15.5 %   Platelets 182 150 - 400 K/uL   nRBC 0.0 0.0 - 0.2 %  Basic metabolic panel     Status: Abnormal   Collection Time: 01/10/19 11:42 AM  Result Value Ref Range   Sodium 134 (L) 135 - 145 mmol/L   Potassium 3.8 3.5 - 5.1 mmol/L   Chloride 102 98 - 111 mmol/L   CO2 22 22 - 32 mmol/L   Glucose, Bld 99 70 - 99 mg/dL   BUN 7 6 - 20 mg/dL   Creatinine, Ser 0.47 0.44 - 1.00 mg/dL   Calcium 8.6 (L) 8.9 - 10.3 mg/dL   GFR calc non Af Amer >60 >60 mL/min   GFR calc Af Amer >60 >60 mL/min   Anion gap 10 5 - 15   MAU Course  Procedures Orders Placed This Encounter  Procedures  . Urinalysis, Routine w reflex microscopic    Standing Status:   Standing    Number of Occurrences:   1  . CBC    Standing Status:   Standing    Number of Occurrences:   1  . TSH    Standing Status:   Standing    Number of Occurrences:   1  . Basic metabolic panel    Standing Status:   Standing    Number of Occurrences:   1  . Orthostatic vital signs    Standing Status:   Standing    Number of Occurrences:   1  . ED EKG    Standing Status:   Standing    Number of Occurrences:   1    Order Specific Question:   Reason for Exam    Answer:   Chest Pain  .  Discharge patient    Order Specific Question:   Discharge disposition    Answer:   01-Home or Self Care [1]    Order Specific Question:   Discharge patient date    Answer:   01/10/2019   MDM Labs and EKG ordered. EKG NSR. Occasional increase in HR to 120s noted on pulse ox, observed once while she was talking. No evidence of cardiac pathology, mild dehydration noted which could be worsening sx that are likely d/t changes of pregnancy and/or stress. Recommend she proceed to [facility] of sx recur or are longer lasting. Stable for discharge home.   Assessment and Plan   1. [redacted] weeks gestation of pregnancy   2. Racing heart beat   3. Dehydration    Discharge home Follow up at [facility] as scheduled Return to [facility]U for OB emergencies Hydrate  Allergies as of 01/10/2019      Reactions   Benadryl [diphenhydramine Hcl] Shortness Of Breath   Pt states that she feels fine if she sits down, but if she is moving around after taking benadryl, she feels SOB   Penicillins Shortness Of Breath   Has patient had a PCN reaction causing immediate rash, facial/tongue/throat swelling, SOB or lightheadedness with hypotension: yes Has patient had a PCN reaction causing severe rash involving mucus membranes or skin necrosis: no Has patient had a PCN reaction that required hospitalization: no Has patient had a PCN reaction occurring  within the last 10 years: no If all of the above answers are "NO", then may proceed with Cephalosporin use.   Adhesive [tape] Hives   Percocet [oxycodone-acetaminophen]    Lactose Intolerance (gi) Diarrhea, Nausea And Vomiting   Latex Rash   Sulfa Antibiotics Rash   Ultram [tramadol Hcl] Rash      Medication List    TAKE these medications   acetaminophen 325 MG tablet Commonly known as:  TYLENOL Take 650 mg by mouth every 6 (six) hours as needed for moderate pain.   cetirizine 10 MG tablet Commonly known as:  ZYRTEC Take 10 mg by mouth daily.   Doxylamine-Pyridoxine 10-10 MG  Tbec Take 1 in am, 1 in pm and 2 at HS   esomeprazole 20 MG capsule Commonly known as:  NEXIUM Take 20 mg by mouth daily as needed (heartburn).   predniSONE 10 MG (21) Tbpk tablet Commonly known as:  STERAPRED UNI-PAK 21 TAB As directed   prenatal vitamin w/FE, FA 27-1 MG Tabs tablet Take 1 tablet by mouth daily at 12 noon.   ProAir HFA 108 (90 Base) MCG/ACT inhaler Generic drug:  albuterol   promethazine 25 MG tablet Commonly known as:  PHENERGAN Take 0.5-1 tablets (12.5-25 mg total) by mouth every 6 (six) hours as needed for nausea or vomiting.   Symbicort 160-4.5 MCG/ACT inhaler Generic drug:  budesonide-formoterol      Julianne Handler, CNM 01/10/2019, 12:20 PM

## 2019-01-10 NOTE — MAU Note (Signed)
States last few days, heart has been racing off and on. No prior hx.  States her mom had this happen when she was pregnant with her brother.

## 2019-01-10 NOTE — MAU Note (Signed)
Pt reports feeling like her "heart is racing" for the past few days.  Pt reports hx of asthma and uses an inhaler twice a day.  Pt denies any pain or bleeding at this time.

## 2019-01-11 ENCOUNTER — Other Ambulatory Visit: Payer: Self-pay | Admitting: Women's Health

## 2019-01-11 ENCOUNTER — Telehealth: Payer: Self-pay | Admitting: *Deleted

## 2019-01-11 ENCOUNTER — Telehealth: Payer: Self-pay | Admitting: Obstetrics & Gynecology

## 2019-01-11 DIAGNOSIS — Z3A14 14 weeks gestation of pregnancy: Secondary | ICD-10-CM

## 2019-01-11 DIAGNOSIS — R000 Tachycardia, unspecified: Secondary | ICD-10-CM

## 2019-01-11 NOTE — Telephone Encounter (Signed)
Patient informed Holter monitor was sent to cardiologist. If she does not hear from them in about a week, to let us know. Verbalized understanding.

## 2019-01-11 NOTE — Telephone Encounter (Signed)
Patient states she is still experiencing feelings of her heart skipping a beat and it racing.  She has checked her heart rate when working with a pulse ox and at times has read 208.  She went to MAU yesterday and was told she was dehydrated but she is concerned that is not just that. Please advise.

## 2019-01-11 NOTE — Telephone Encounter (Signed)
Patient called, stated that she's 14 weeks and her heart keeping racing where it skips a beat.  She states that it happens when she's just laying in the bed and it just starts racing.  She stated that this has been going on for a week.  [phone]

## 2019-01-19 ENCOUNTER — Other Ambulatory Visit: Payer: Self-pay

## 2019-01-19 ENCOUNTER — Encounter: Payer: Self-pay | Admitting: Infectious Disease

## 2019-01-19 ENCOUNTER — Ambulatory Visit (INDEPENDENT_AMBULATORY_CARE_PROVIDER_SITE_OTHER): Payer: 59 | Admitting: Infectious Disease

## 2019-01-19 VITALS — BP 115/79 | HR 81 | Temp 98.3°F | Wt 194.0 lb

## 2019-01-19 DIAGNOSIS — J4540 Moderate persistent asthma, uncomplicated: Secondary | ICD-10-CM | POA: Diagnosis not present

## 2019-01-19 DIAGNOSIS — J411 Mucopurulent chronic bronchitis: Secondary | ICD-10-CM | POA: Diagnosis not present

## 2019-01-19 DIAGNOSIS — J45909 Unspecified asthma, uncomplicated: Secondary | ICD-10-CM

## 2019-01-19 DIAGNOSIS — B999 Unspecified infectious disease: Secondary | ICD-10-CM | POA: Diagnosis not present

## 2019-01-19 DIAGNOSIS — H6693 Otitis media, unspecified, bilateral: Secondary | ICD-10-CM

## 2019-01-19 DIAGNOSIS — J4550 Severe persistent asthma, uncomplicated: Secondary | ICD-10-CM | POA: Insufficient documentation

## 2019-01-19 DIAGNOSIS — H6506 Acute serous otitis media, recurrent, bilateral: Secondary | ICD-10-CM

## 2019-01-19 HISTORY — DX: Mucopurulent chronic bronchitis: J41.1

## 2019-01-19 HISTORY — DX: Otitis media, unspecified, bilateral: H66.93

## 2019-01-19 HISTORY — DX: Unspecified asthma, uncomplicated: J45.909

## 2019-01-19 NOTE — Progress Notes (Signed)
Subjective:    Patient ID: Tammy Chapman, female    DOB: 09/02/1990, 28 y.o.   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 Luan Pulling  HPI   Tammy Chapman is a 28 year old Caucasian female with a past medical history significant for chronic asthma gastroesophageal reflux disease, recurrent otitis media who was referred to our clinic for infectious these consult.  Note the notes that I have state that the patient has an abnormal BW.  I cannot quite understand what that means other than potential abnormal blood work.  In talking to her she recounts a history of having recurrent ear infections that she says is attributable to fact that she has abnormal eustachian tubes.  She still has a tube in the left eardrum.  She does also complain of recurrent pulmonary infections.  She specifically believes she has had at least 3 or 4 episodes of whooping cough.  I asked her how this diagnosis was made and she said this was based on laboratory data.  If indeed whooping cough diagnosis is being made by serologies I would not be highly skeptical that she is in fact had 3-4 episodes of whooping cough in the last 2 years.  I do think it is possible that she could have recurrent sinopulmonary infections and that she could have an underlying immunodeficiency syndrome such as carry out common variable immunodeficiency.  Certainly the most reliable way to diagnose whooping cough would be to send a PCR from the oropharynx for Bordetella pertussis PCR.  Acute and convalescent titers could be meaningful but simply getting labs that measure the quantitative immunoglobulin levels specific to showing immunity to Bordetella are not going to be a means of ascertaining whether she is having recurrent infections.  She does have multiple allergies including allergies to penicillin as a child which caused a rash and according to her difficulty breathing.   She had similar reaction with sulfa.  She is able to take azithromycin but says she always develops a slight rash on her arms when she takes this.  During this pregnancy which she is now in the first trimester of she had an ear infection that was treated with cefdinir.  Of note she can take cephalosporins.  She then developed a severe cough which did not respond to azithromycin and for which she was prescribed levofloxacin which I thought was contraindicated during pregnancy.  In any case her cough then resolved.  She was tested for gonorrhea and chlamydia but not because he was having STI screening.  I am a bit annoyed that the best practice alert was not followed and that an HIV test was not done at that time.  I do not see why one would not get all of the STI testing at the same time rather than haphazardly checking an HIV here and gonorrhea and chlamydia there.  Nonetheless she does not sound to be high risk based on her account of being monogamous with her husband who is hopefully also being monogamous.    Past Medical History:  Diagnosis Date   Asthma    Bronchitis, mucopurulent recurrent ([place]) 01/19/2019   Chronic asthma 01/19/2019   GERD (gastroesophageal reflux disease)    Loose body of right knee 02/2013   Mosquito bite 03/08/2013   Recurrent otitis media of both ears 01/19/2019   Sprain and strain of medial collateral ligament of knee 02/2013   right    Past Surgical History:  Procedure Laterality  Date   CESAREAN SECTION N/A 01/08/2017   Procedure: CESAREAN SECTION;  Surgeon: Jonnie Kind, MD;  Location: [facility];  Service: Obstetrics;  Laterality: N/A;   CHOLECYSTECTOMY  01/20/2008   EAR TUBE REMOVAL  2002   ESOPHAGOGASTRODUODENOSCOPY  05/15/2011   Procedure: ESOPHAGOGASTRODUODENOSCOPY (EGD);  Surgeon: Rogene Houston, MD;  [facility];  Service: Endoscopy;  Laterality: N/A;  3:15    KNEE ARTHROSCOPY Right 03/15/2013   Procedure: RIGHT  KNEE  ARTHROSCOPY, FEMORAL PATELLA REEFING, LATERAL RELEASE, PARTIAL LATERAL MENISCECTOMY ;  Surgeon: Lorn Junes, MD;  Location: [facility];  Service: Orthopedics;  Laterality: Right;   TONSILLECTOMY AND ADENOIDECTOMY  1999   TYMPANOSTOMY [place], 2011    Family History  Problem Relation Age of Onset   Diabetes Mother    Cancer Paternal Grandfather        renal   Diabetes Maternal Grandfather    COPD Maternal Grandfather    Congestive Heart Failure Maternal Grandfather    Eclampsia Sister    Eclampsia Maternal Aunt    Eclampsia Maternal Aunt    Eclampsia Maternal Aunt       Social History   Socioeconomic History   Marital status: Significant Other    Spouse name: Not on file   Number of children: Not on file   Years of education: Not on file   Highest education level: Not on file  Occupational History   Not on file  Social Needs   Financial resource strain: Not on file   Food insecurity:    Worry: Not on file    Inability: Not on file   Transportation needs:    Medical: Not on file    Non-medical: Not on file  Tobacco Use   Smoking status: Never Smoker   Smokeless tobacco: Never Used  Substance and Sexual Activity   Alcohol use: No   Drug use: No   Sexual activity: Yes    Partners: Male    Birth control/protection: None  Lifestyle   Physical activity:    Days per week: Not on file    Minutes per session: Not on file   Stress: Not on file  Relationships   Social connections:    Talks on phone: Not on file    Gets together: Not on file    Attends religious service: Not on file    Active member of club or organization: Not on file    Attends meetings of clubs or organizations: Not on file    Relationship status: Not on file  Other Topics Concern   Not on file  Social History Narrative   Not on file    Allergies  Allergen Reactions   Benadryl [Diphenhydramine Hcl] Shortness Of Breath    Pt states  that she feels fine if she sits down, but if she is moving around after taking benadryl, she feels SOB   Penicillins Shortness Of Breath    Has patient had a PCN reaction causing immediate rash, facial/tongue/throat swelling, SOB or lightheadedness with hypotension: yes Has patient had a PCN reaction causing severe rash involving mucus membranes or skin necrosis: no Has patient had a PCN reaction that required hospitalization: no Has patient had a PCN reaction occurring within the last 10 years: no If all of the above answers are "NO", then may proceed with Cephalosporin use.    Adhesive [Tape] Hives   Percocet [Oxycodone-Acetaminophen]    Lactose Intolerance (Gi) Diarrhea and Nausea  And Vomiting   Latex Rash   Sulfa Antibiotics Rash   Ultram [Tramadol Hcl] Rash     Current Outpatient Medications:    acetaminophen (TYLENOL) 325 MG tablet, Take 650 mg by mouth every 6 (six) hours as needed for moderate pain., Disp: , Rfl:    Doxylamine-Pyridoxine 10-10 MG TBEC, Take 1 in am, 1 in pm and 2 at HS, Disp: 60 tablet, Rfl: 6   esomeprazole (NEXIUM) 20 MG capsule, Take 20 mg by mouth daily as needed (heartburn)., Disp: , Rfl:    predniSONE (STERAPRED UNI-PAK 21 TAB) 10 MG (21) TBPK tablet, As directed, Disp: 21 tablet, Rfl: 0   prenatal vitamin w/FE, FA (PRENATAL 1 + 1) 27-1 MG TABS tablet, Take 1 tablet by mouth daily at 12 noon., Disp: 30 each, Rfl: 12   PROAIR HFA 108 (90 Base) MCG/ACT inhaler, , Disp: , Rfl: 0   promethazine (PHENERGAN) 25 MG tablet, Take 0.5-1 tablets (12.5-25 mg total) by mouth every 6 (six) hours as needed for nausea or vomiting., Disp: 30 tablet, Rfl: 0   SYMBICORT 160-4.5 MCG/ACT inhaler, , Disp: , Rfl: 12   cetirizine (ZYRTEC) 10 MG tablet, Take 10 mg by mouth daily., Disp: , Rfl:    Review of Systems  Constitutional: Negative for activity change, appetite change, chills, diaphoresis, fatigue, fever and unexpected weight change.  HENT: Positive for  ear pain. Negative for congestion, rhinorrhea, sinus pressure, sneezing, sore throat and trouble swallowing.   Eyes: Negative for photophobia and visual disturbance.  Respiratory: Positive for cough. Negative for chest tightness, shortness of breath, wheezing and stridor.   Cardiovascular: Negative for chest pain, palpitations and leg swelling.  Gastrointestinal: Negative for abdominal distention, abdominal pain, anal bleeding, blood in stool, constipation, diarrhea, nausea and vomiting.  Genitourinary: Negative for difficulty urinating, dysuria, flank pain and hematuria.  Musculoskeletal: Negative for arthralgias, back pain, gait problem, joint swelling and myalgias.  Skin: Negative for color change, pallor, rash and wound.  Neurological: Negative for dizziness, tremors, weakness and light-headedness.  Hematological: Negative for adenopathy. Does not bruise/bleed easily.  Psychiatric/Behavioral: Negative for agitation, behavioral problems, confusion, decreased concentration, dysphoric mood and sleep disturbance.       Objective:   Physical Exam Constitutional:      General: She is not in acute distress.    Appearance: She is not diaphoretic.  HENT:     Head: Normocephalic and atraumatic.     Right Ear: Hearing, tympanic membrane, ear canal and external ear normal.     Left Ear: External ear normal.     Ears:     Comments: She has a tube in the left ear left tympanic membrane more difficult to visualize    Nose: Nose normal.     Mouth/Throat:     Pharynx: No oropharyngeal exudate.  Eyes:     General: No scleral icterus.    Conjunctiva/sclera: Conjunctivae normal.     Pupils: Pupils are equal, round, and reactive to light.  Neck:     Musculoskeletal: Normal range of motion and neck supple.  Cardiovascular:     Rate and Rhythm: Normal rate and regular rhythm.     Heart sounds: Normal heart sounds. No murmur. No friction rub. No gallop.   Pulmonary:     Effort: Pulmonary effort is  normal. No respiratory distress.     Breath sounds: Normal breath sounds. No stridor. No wheezing, rhonchi or rales.  Chest:     Chest wall: No tenderness.  Abdominal:  General: Bowel sounds are normal. There is no distension.     Palpations: Abdomen is soft.     Tenderness: There is no abdominal tenderness. There is no rebound.  Musculoskeletal: Normal range of motion.        General: No tenderness.  Lymphadenopathy:     Cervical: No cervical adenopathy.  Skin:    General: Skin is warm and dry.     Coloration: Skin is not pale.     Findings: No erythema or rash.  Neurological:     Mental Status: She is alert and oriented to person, place, and time.     Coordination: Coordination normal.  Psychiatric:        Judgment: Judgment normal.           Assessment & Plan:   Recurrent sinopulmonary infections: Some of this could be due to her underlying asthma which sounds difficult to be controlled.  I do think is reasonable to screen for common variable immune deficiency with immunoglobulin panel.  Also check her neutrophil function with an oxidative burst test.  I will check a sed rate C-reactive protein conference of metabolic panel and CBC with differential.  Screening during pregnancy: She certainly does need an HIV test offered to that today but she wanted to have it done with her obstetrician.  I spent greater than 60 minutes with the patient including greater than 50% of time in face to face counsel of the patient regarding the nature of recurrent sinopulmonary infections underlying causes including asthma and including possible common variable immune deficiency and other immune deficiency states, explaining the nature of infection such as Bordetella pertussis which antibiotics are active against this organism and the pattern of infection with his organism which is not fit with what is being described by her and in coordination of her care.

## 2019-01-20 LAB — CBC WITH DIFFERENTIAL/PLATELET
Absolute Monocytes: 429 cells/uL (ref 200–950)
Basophils Absolute: 20 cells/uL (ref 0–200)
Basophils Relative: 0.3 %
Eosinophils Absolute: 33 cells/uL (ref 15–500)
Eosinophils Relative: 0.5 %
HCT: 39.7 % (ref 35.0–45.0)
Hemoglobin: 13.4 g/dL (ref 11.7–15.5)
Lymphs Abs: 1683 cells/uL (ref 850–3900)
MCH: 28.8 pg (ref 27.0–33.0)
MCHC: 33.8 g/dL (ref 32.0–36.0)
MCV: 85.4 fL (ref 80.0–100.0)
MPV: 10.6 fL (ref 7.5–12.5)
Monocytes Relative: 6.5 %
Neutro Abs: 4435 cells/uL (ref 1500–7800)
Neutrophils Relative %: 67.2 %
Platelets: 174 10*3/uL (ref 140–400)
RBC: 4.65 10*6/uL (ref 3.80–5.10)
RDW: 13.5 % (ref 11.0–15.0)
Total Lymphocyte: 25.5 %
WBC: 6.6 10*3/uL (ref 3.8–10.8)

## 2019-01-20 LAB — IGG, IGA, IGM
IgG (Immunoglobin G), Serum: 871 mg/dL (ref 600–1640)
IgM, Serum: 218 mg/dL (ref 50–300)
Immunoglobulin A: 137 mg/dL (ref 47–310)

## 2019-01-20 LAB — COMPLETE METABOLIC PANEL WITH GFR
AG Ratio: 1.4 (calc) (ref 1.0–2.5)
ALT: 8 U/L (ref 6–29)
AST: 7 U/L — ABNORMAL LOW (ref 10–30)
Albumin: 3.9 g/dL (ref 3.6–5.1)
Alkaline phosphatase (APISO): 57 U/L (ref 31–125)
BUN/Creatinine Ratio: 11 (calc) (ref 6–22)
BUN: 6 mg/dL — ABNORMAL LOW (ref 7–25)
CO2: 25 mmol/L (ref 20–32)
Calcium: 8.8 mg/dL (ref 8.6–10.2)
Chloride: 104 mmol/L (ref 98–110)
Creat: 0.54 mg/dL (ref 0.50–1.10)
GFR, Est African American: 151 mL/min/{1.73_m2} (ref >=60)
GFR, Est Non African American: 130 mL/min/{1.73_m2} (ref >=60)
Globulin: 2.7 g/dL (calc) (ref 1.9–3.7)
Glucose, Bld: 89 mg/dL (ref 65–99)
Potassium: 4.1 mmol/L (ref 3.5–5.3)
Sodium: 136 mmol/L (ref 135–146)
Total Bilirubin: 0.8 mg/dL (ref 0.2–1.2)
Total Protein: 6.6 g/dL (ref 6.1–8.1)

## 2019-01-20 LAB — C-REACTIVE PROTEIN: CRP: 12.7 mg/L — ABNORMAL HIGH (ref <8.0)

## 2019-01-20 LAB — SEDIMENTATION RATE: Sed Rate: 14 mm/h (ref 0–20)

## 2019-01-21 ENCOUNTER — Telehealth: Payer: 59 | Admitting: Nurse Practitioner

## 2019-01-21 DIAGNOSIS — B373 Candidiasis of vulva and vagina: Secondary | ICD-10-CM | POA: Diagnosis not present

## 2019-01-21 DIAGNOSIS — B3731 Acute candidiasis of vulva and vagina: Secondary | ICD-10-CM

## 2019-01-21 LAB — NEUTROPHIL OXD BURST
% OXIDATION POSITIVE NEUTROPHILS: 100 %
SPECIMEN AGE: 48

## 2019-01-21 NOTE — Progress Notes (Signed)
We are sorry that you are not feeling well. Here is how we plan to help! Based on what you shared with me it looks like you: May have a yeast vaginosis  Vaginosis is an inflammation of the vagina that can result in discharge, itching and pain. The cause is usually a change in the normal balance of vaginal bacteria or an infection. Vaginosis can also result from reduced estrogen levels after menopause.  The most common causes of vaginosis are:   Bacterial vaginosis which results from an overgrowth of one on several organisms that are normally present in your vagina.   Yeast infections which are caused by a naturally occurring fungus called candida.   Vaginal atrophy (atrophic vaginosis) which results from the thinning of the vagina from reduced estrogen levels after menopause.   Trichomoniasis which is caused by a parasite and is commonly transmitted by sexual intercourse.  Factors that increase your risk of developing vaginosis include: Marland Kitchen Medications, such as antibiotics and steroids . Uncontrolled diabetes . Use of hygiene products such as bubble bath, vaginal spray or vaginal deodorant . Douching . Wearing damp or tight-fitting clothing . Using an intrauterine device (IUD) for birth control . Hormonal changes, such as those associated with pregnancy, birth control pills or menopause . Sexual activity . Having a sexually transmitted infection  Your treatment plan is Monistat (miconazole) or Gyne-Lotrimin (clotrimazole) over the counter at most phamacies. Due to your pregnancy you we cannot prescribe diflucan.  Be sure to take all of the medication as directed. Stop taking any medication if you develop a rash, tongue swelling or shortness of breath. Mothers who are breast feeding should consider pumping and discarding their breast milk while on these antibiotics. However, there is no consensus that infant exposure at these doses would be harmful.  Remember that medication creams can  weaken latex condoms. Marland Kitchen   HOME CARE:  Good hygiene may prevent some types of vaginosis from recurring and may relieve some symptoms:  . Avoid baths, hot tubs and whirlpool spas. Rinse soap from your outer genital area after a shower, and dry the area well to prevent irritation. Don't use scented or harsh soaps, such as those with deodorant or antibacterial action. Marland Kitchen Avoid irritants. These include scented tampons and pads. . Wipe from front to back after using the toilet. Doing so avoids spreading fecal bacteria to your vagina.  Other things that may help prevent vaginosis include:  Marland Kitchen Don't douche. Your vagina doesn't require cleansing other than normal bathing. Repetitive douching disrupts the normal organisms that reside in the vagina and can actually increase your risk of vaginal infection. Douching won't clear up a vaginal infection. . Use a latex condom. Both female and female latex condoms may help you avoid infections spread by sexual contact. . Wear cotton underwear. Also wear pantyhose with a cotton crotch. If you feel comfortable without it, skip wearing underwear to bed. Yeast thrives in [employer] Your symptoms should improve in the next day or two.  GET HELP RIGHT AWAY IF:  . You have pain in your lower abdomen ( pelvic area or over your ovaries) . You develop nausea or vomiting . You develop a fever . Your discharge changes or worsens . You have persistent pain with intercourse . You develop shortness of breath, a rapid pulse, or you faint.  These symptoms could be signs of problems or infections that need to be evaluated by a medical provider now.  MAKE SURE YOU  Understand these instructions.  Will watch your condition.  Will get help right away if you are not doing well or get worse.  Your e-visit answers were reviewed by a board certified advanced clinical practitioner to complete your personal care plan. Depending upon the condition, your plan could  have included both over the counter or prescription medications. Please review your pharmacy choice to make sure that you have choses a pharmacy that is open for you to pick up any needed prescription, Your safety is important to [place]. If you have drug allergies check your prescription carefully.   You can use MyChart to ask questions about today's visit, request a non-urgent call back, or ask for a work or school excuse for 24 hours related to this e-Visit. If it has been greater than 24 hours you will need to follow up with your provider, or enter a new e-Visit to address those concerns. You will get a MyChart message within the next two days asking about your experience. I hope that your e-visit has been valuable and will speed your recovery.  5-10 minutes spent reviewing and documenting in chart.

## 2019-01-28 DIAGNOSIS — H5213 Myopia, bilateral: Secondary | ICD-10-CM | POA: Diagnosis not present

## 2019-01-28 DIAGNOSIS — Z135 Encounter for screening for eye and ear disorders: Secondary | ICD-10-CM | POA: Diagnosis not present

## 2019-02-04 ENCOUNTER — Encounter: Payer: Self-pay | Admitting: Obstetrics & Gynecology

## 2019-02-04 ENCOUNTER — Ambulatory Visit (INDEPENDENT_AMBULATORY_CARE_PROVIDER_SITE_OTHER): Payer: 59

## 2019-02-04 ENCOUNTER — Other Ambulatory Visit: Payer: Self-pay

## 2019-02-04 ENCOUNTER — Ambulatory Visit (INDEPENDENT_AMBULATORY_CARE_PROVIDER_SITE_OTHER): Payer: 59 | Admitting: Obstetrics & Gynecology

## 2019-02-04 DIAGNOSIS — Z3A18 18 weeks gestation of pregnancy: Secondary | ICD-10-CM

## 2019-02-04 DIAGNOSIS — Z363 Encounter for antenatal screening for malformations: Secondary | ICD-10-CM

## 2019-02-04 DIAGNOSIS — Z3481 Encounter for supervision of other normal pregnancy, first trimester: Secondary | ICD-10-CM | POA: Diagnosis not present

## 2019-02-04 DIAGNOSIS — Z8759 Personal history of other complications of pregnancy, childbirth and the puerperium: Secondary | ICD-10-CM | POA: Diagnosis not present

## 2019-02-04 DIAGNOSIS — Z3A12 12 weeks gestation of pregnancy: Secondary | ICD-10-CM | POA: Diagnosis not present

## 2019-02-04 DIAGNOSIS — Z3482 Encounter for supervision of other normal pregnancy, second trimester: Secondary | ICD-10-CM

## 2019-02-04 DIAGNOSIS — Z1389 Encounter for screening for other disorder: Secondary | ICD-10-CM

## 2019-02-04 DIAGNOSIS — Z331 Pregnant state, incidental: Secondary | ICD-10-CM

## 2019-02-04 LAB — POCT URINALYSIS DIPSTICK OB
Blood, UA: NEGATIVE
Glucose, UA: NEGATIVE
Ketones, UA: NEGATIVE
Leukocytes, UA: NEGATIVE
Nitrite, UA: NEGATIVE
POC,PROTEIN,UA: NEGATIVE

## 2019-02-04 NOTE — Progress Notes (Signed)
   LOW-RISK PREGNANCY VISIT Patient name: Tammy Chapman MRN [id number]  Date of birth: 01-18-1991 Chief Complaint:   Routine Prenatal Visit (anatomy)  History of Present Illness:   Tammy Chapman is a 28 y.o. G26P1001 female at [redacted]w[redacted]d with an Estimated Date of Delivery: 07/08/19 being seen today for ongoing management of a low-risk pregnancy.  Today she reports no complaints. Contractions: Not present. Vag. Bleeding: None.  Movement: Present. denies leaking of fluid. Review of Systems:   Pertinent items are noted in HPI Denies abnormal vaginal discharge w/ itching/odor/irritation, headaches, visual changes, shortness of breath, chest pain, abdominal pain, severe nausea/vomiting, or problems with urination or bowel movements unless otherwise stated above. Pertinent History Reviewed:  Reviewed past medical,surgical, social, obstetrical and family history.  Reviewed problem list, medications and allergies. Physical Assessment:  There were no vitals filed for this visit.There is no height or weight on file to calculate BMI.        Physical Examination:   General appearance: Well appearing, and in no distress  Mental status: Alert, oriented to person, place, and time  Skin: Warm & dry  Cardiovascular: Normal heart rate noted  Respiratory: Normal respiratory effort, no distress  Abdomen: Soft, gravid, nontender  Pelvic: Cervical exam deferred         Extremities:    Fetal Status: Fetal Heart Rate (bpm): 156 Fundal Height: 18 cm Movement: Present    Results for orders placed or performed in visit on 02/04/19 (from the past 24 hour(s))  POC Urinalysis Dipstick OB   Collection Time: 02/04/19  9:24 AM  Result Value Ref Range   Color, UA     Clarity, UA     Glucose, UA Negative Negative   Bilirubin, UA     Ketones, UA neg    Spec Grav, UA     Blood, UA neg    pH, UA     POC,PROTEIN,UA Negative Negative, Trace, Small (1+), Moderate (2+), Large (3+), 4+   Urobilinogen, UA     Nitrite, UA  neg    Leukocytes, UA Negative Negative   Appearance     Odor      Assessment & Plan:  1) Low-risk pregnancy G2P1001 at [redacted]w[redacted]d with an Estimated Date of Delivery: 07/08/19   2) Sonogram is normal   Meds: No orders of the defined types were placed in this encounter.  Labs/procedures today:   Plan:  Continue routine obstetrical care   Reviewed: Preterm labor symptoms and general obstetric precautions including but not limited to vaginal bleeding, contractions, leaking of fluid and fetal movement were reviewed in detail with the patient.  All questions were answered  Follow-up: Return in about 4 weeks (around 03/04/2019) for webex visit , LROB.  Orders Placed This Encounter  Procedures  . Protein / creatinine ratio, urine  . POC Urinalysis Dipstick OB   Florian Buff  02/04/2019 9:43 AM

## 2019-02-04 NOTE — Progress Notes (Signed)
[place] 18 wks,breech,anterior placenta gr 0,cx 3 cm,svp of fluid 4.7 cm,normal ovaries bilat,fhr 152 bpm,EFW 241 g 73%,anatomy complete,no obvious abnormalities

## 2019-02-05 LAB — COMPREHENSIVE METABOLIC PANEL
ALT: 8 IU/L (ref 0–32)
AST: 6 IU/L (ref 0–40)
Albumin/Globulin Ratio: 1.7 (ref 1.2–2.2)
Albumin: 4.2 g/dL (ref 3.9–5.0)
Alkaline Phosphatase: 67 IU/L (ref 39–117)
BUN/Creatinine Ratio: 19 (ref 9–23)
BUN: 10 mg/dL (ref 6–20)
Bilirubin Total: 0.6 mg/dL (ref 0.0–1.2)
CO2: 22 mmol/L (ref 20–29)
Calcium: 8.8 mg/dL (ref 8.7–10.2)
Chloride: 100 mmol/L (ref 96–106)
Creatinine, Ser: 0.53 mg/dL — ABNORMAL LOW (ref 0.57–1.00)
GFR calc Af Amer: 150 mL/min/{1.73_m2} (ref >59)
GFR calc non Af Amer: 131 mL/min/{1.73_m2} (ref >59)
Globulin, Total: 2.5 g/dL (ref 1.5–4.5)
Glucose: 93 mg/dL (ref 65–99)
Potassium: 4.2 mmol/L (ref 3.5–5.2)
Sodium: 137 mmol/L (ref 134–144)
Total Protein: 6.7 g/dL (ref 6.0–8.5)

## 2019-02-05 LAB — PROTEIN / CREATININE RATIO, URINE
Creatinine, Urine: 63.5 mg/dL
Protein, Ur: 8.6 mg/dL
Protein/Creat Ratio: 135 mg/g creat (ref 0–200)

## 2019-02-06 LAB — OBSTETRIC PANEL, INCLUDING HIV
Antibody Screen: NEGATIVE
Basophils Absolute: 0 10*3/uL (ref 0.0–0.2)
Basos: 0 %
EOS (ABSOLUTE): 0 10*3/uL (ref 0.0–0.4)
Eos: 0 %
HIV Screen 4th Generation wRfx: NONREACTIVE
Hematocrit: 41.4 % (ref 34.0–46.6)
Hemoglobin: 13.7 g/dL (ref 11.1–15.9)
Hepatitis B Surface Ag: NEGATIVE
Immature Grans (Abs): 0.1 10*3/uL (ref 0.0–0.1)
Immature Granulocytes: 1 %
Lymphocytes Absolute: 1.8 10*3/uL (ref 0.7–3.1)
Lymphs: 20 %
MCH: 28.2 pg (ref 26.6–33.0)
MCHC: 33.1 g/dL (ref 31.5–35.7)
MCV: 85 fL (ref 79–97)
Monocytes Absolute: 0.6 10*3/uL (ref 0.1–0.9)
Monocytes: 7 %
Neutrophils Absolute: 6.3 10*3/uL (ref 1.4–7.0)
Neutrophils: 72 %
Platelets: 202 10*3/uL (ref 150–450)
RBC: 4.85 x10E6/uL (ref 3.77–5.28)
RDW: 13.3 % (ref 11.7–15.4)
RPR Ser Ql: NONREACTIVE
Rh Factor: POSITIVE
Rubella Antibodies, IGG: 6.02 index (ref >0.99)
WBC: 8.8 10*3/uL (ref 3.4–10.8)

## 2019-02-06 LAB — URINALYSIS, ROUTINE W REFLEX MICROSCOPIC
Bilirubin, UA: NEGATIVE
Glucose, UA: NEGATIVE
Ketones, UA: NEGATIVE
Leukocytes,UA: NEGATIVE
Nitrite, UA: NEGATIVE
Protein,UA: NEGATIVE
RBC, UA: NEGATIVE
Specific Gravity, UA: 1.019 (ref 1.005–1.030)
Urobilinogen, Ur: 0.2 mg/dL (ref 0.2–1.0)
pH, UA: 6 (ref 5.0–7.5)

## 2019-02-06 LAB — SICKLE CELL SCREEN: Sickle Cell Screen: NEGATIVE

## 2019-02-07 ENCOUNTER — Telehealth: Payer: Self-pay | Admitting: Emergency Medicine

## 2019-02-07 NOTE — Telephone Encounter (Signed)
Spoke with pt. She states that 02/10/19 does work with her work schedule. She has been rescheduled to see RB on 02/15/2019 at 1045. Nothing further is needed at this time.

## 2019-02-10 ENCOUNTER — Institutional Professional Consult (permissible substitution): Payer: 59 | Admitting: Emergency Medicine

## 2019-02-15 ENCOUNTER — Ambulatory Visit (INDEPENDENT_AMBULATORY_CARE_PROVIDER_SITE_OTHER): Payer: 59 | Admitting: Emergency Medicine

## 2019-02-15 ENCOUNTER — Encounter: Payer: Self-pay | Admitting: Emergency Medicine

## 2019-02-15 ENCOUNTER — Other Ambulatory Visit: Payer: Self-pay

## 2019-02-15 DIAGNOSIS — J4540 Moderate persistent asthma, uncomplicated: Secondary | ICD-10-CM

## 2019-02-15 MED ORDER — MONTELUKAST SODIUM 10 MG PO TABS: 10.0000 mg | ORAL_TABLET | Freq: Every day | ORAL | 11 refills | Status: DC

## 2019-02-15 NOTE — Progress Notes (Signed)
 Subjective:    Patient ID: Tammy Chapman, female    DOB: 11/04/1990, 28 y.o.   MRN: [id number]   HPI 28-year-old never smoker, carries a history of childhood asthma, had grown out of it. She had RSV in 2018 aftter delivery of her son. Improved with steroids. Then last year she had cough, dyspnea, chest tightness, was dx with adult asthma >>  Pulmonary function testing and methacholine challenge 01/20/2018 reviewed, shows normal initial spirometry with hyperresponsiveness to methacholine at a dose of 0.25 consistent with asthma.  Symbicort and albuterol added.   She had another rough period in Nov, Dec. She is currently [redacted] weeks pregnant (beginning March).  Currently managed on Zyrtec, Nexium 20 mg prn, Symbicort, pro-air which she uses approximately 3-4x a day. Doesn't always help completely.   She has been dealing with more dyspnea and chest tightness. Minimal congestion these days. No overt GERD sx (yet; she had this with her first pregnancy)  She saw Dr. VanDam 6/3 and neutral oxidative assay was normal.  Immunoglobulin screen was all normal as well.  ESR 14.  CRP slightly elevated at 12.7   Review of Systems  Constitutional: Negative for fever and unexpected weight change.  HENT: Negative for congestion, dental problem, ear pain, nosebleeds, postnasal drip, rhinorrhea, sinus pressure, sneezing, sore throat and trouble swallowing.   Eyes: Negative for redness and itching.  Respiratory: Positive for cough. Negative for chest tightness, shortness of breath and wheezing.   Cardiovascular: Negative for palpitations and leg swelling.  Gastrointestinal: Negative for nausea and vomiting.  Genitourinary: Negative for dysuria.  Musculoskeletal: Negative for joint swelling.  Skin: Negative for rash.  Neurological: Negative for headaches.  Hematological: Does not bruise/bleed easily.  Psychiatric/Behavioral: Negative for dysphoric mood. The patient is not nervous/anxious.     Past Medical  History:  Diagnosis Date  . Asthma   . Bronchitis, mucopurulent recurrent (HCC) 01/19/2019  . Chronic asthma 01/19/2019  . GERD (gastroesophageal reflux disease)   . Loose body of right knee 02/2013  . Mosquito bite 03/08/2013  . Recurrent otitis media of both ears 01/19/2019  . Sprain and strain of medial collateral ligament of knee 02/2013   right     Family History  Problem Relation Age of Onset  . Diabetes Mother   . Cancer Paternal Grandfather        renal  . Diabetes Maternal Grandfather   . COPD Maternal Grandfather   . Congestive Heart Failure Maternal Grandfather   . Eclampsia Sister   . Eclampsia Maternal Aunt   . Eclampsia Maternal Aunt   . Eclampsia Maternal Aunt      Social History   Socioeconomic History  . Marital status: Significant Other    Spouse name: Not on file  . Number of children: Not on file  . Years of education: Not on file  . Highest education level: Not on file  Occupational History  . Not on file  Social Needs  . Financial resource strain: Not on file  . Food insecurity    Worry: Not on file    Inability: Not on file  . Transportation needs    Medical: Not on file    Non-medical: Not on file  Tobacco Use  . Smoking status: Never Smoker  . Smokeless tobacco: Never Used  Substance and Sexual Activity  . Alcohol use: No  . Drug use: No  . Sexual activity: Yes    Partners: Male    Birth control/protection: None  Lifestyle  .   Physical activity    Days per week: Not on file    Minutes per session: Not on file  . Stress: Not on file  Relationships  . Social Herbalist on phone: Not on file    Gets together: Not on file    Attends religious service: Not on file    Active member of club or organization: Not on file    Attends meetings of clubs or organizations: Not on file    Relationship status: Not on file  . Intimate partner violence    Fear of current or ex partner: Not on file    Emotionally abused: Not on file     Physically abused: Not on file    Forced sexual activity: Not on file  Other Topics Concern  . Not on file  Social History Narrative  . Not on file     Allergies  Allergen Reactions  . Benadryl [Diphenhydramine Hcl] Shortness Of Breath    Pt states that she feels fine if she sits down, but if she is moving around after taking benadryl, she feels SOB  . Penicillins Shortness Of Breath    Has patient had a PCN reaction causing immediate rash, facial/tongue/throat swelling, SOB or lightheadedness with hypotension: yes Has patient had a PCN reaction causing severe rash involving mucus membranes or skin necrosis: no Has patient had a PCN reaction that required hospitalization: no Has patient had a PCN reaction occurring within the last 10 years: no If all of the above answers are "NO", then may proceed with Cephalosporin use.   . Adhesive [Tape] Hives  . Percocet [Oxycodone-Acetaminophen]   . Lactose Intolerance (Gi) Diarrhea and Nausea And Vomiting  . Latex Rash  . Sulfa Antibiotics Rash  . Ultram [Tramadol Hcl] Rash     Outpatient Medications Prior to Visit  Medication Sig Dispense Refill  . acetaminophen (TYLENOL) 325 MG tablet Take 650 mg by mouth every 6 (six) hours as needed for moderate pain.    Marland Kitchen aspirin 81 MG chewable tablet Chew 162 mg by mouth daily.    . cetirizine (ZYRTEC) 10 MG tablet Take 10 mg by mouth daily.    . Doxylamine-Pyridoxine 10-10 MG TBEC Take 1 in am, 1 in pm and 2 at HS 60 tablet 6  . esomeprazole (NEXIUM) 20 MG capsule Take 20 mg by mouth daily as needed (heartburn).    . prenatal vitamin w/FE, FA (PRENATAL 1 + 1) 27-1 MG TABS tablet Take 1 tablet by mouth daily at 12 noon. 30 each 12  . PROAIR HFA 108 (90 Base) MCG/ACT inhaler   0  . promethazine (PHENERGAN) 25 MG tablet Take 0.5-1 tablets (12.5-25 mg total) by mouth every 6 (six) hours as needed for nausea or vomiting. 30 tablet 0  . SYMBICORT 160-4.5 MCG/ACT inhaler   12  . predniSONE (STERAPRED  UNI-PAK 21 TAB) 10 MG (21) TBPK tablet As directed (Patient not taking: Reported on 02/04/2019) 21 tablet 0   No facility-administered medications prior to visit.         Objective:   Physical Exam Today's Vitals   02/15/19 1030  BP: 118/78  Pulse: 99  Temp: 98.5 F (36.9 C)  TempSrc: Oral  SpO2: 98%  Weight: 200 lb (90.7 kg)  Height: 5' 10" (1.778 m)   Body mass index is 28.7 kg/m.   Gen: Pleasant, well-nourished, in no distress,  normal affect  ENT: No lesions,  mouth clear,  oropharynx clear, no postnasal drip  Neck: No JVD, some UA noise and exp stridor on a forced exp  Lungs: No use of accessory muscles, no crackles or wheezing on normal respiration, no wheeze on forced expiration  Cardiovascular: RRR, heart sounds normal, no murmur or gallops, no peripheral edema  Musculoskeletal: No deformities, no cyanosis or clubbing  Neuro: alert, awake, non focal  Skin: Warm, no lesions or rash     Assessment & Plan:  Chronic asthma Asthma as confirmed by her methacholine challenge in June 2019.  She is had flaring symptoms that have been ascribed to asthma and also RSV or bronchitis that I suspect have been asthma flares with overlapping upper airway instability and chronic cough.  She is responded to steroids and antibiotics before. One episode of poor control for her was when she was pregnant with her first child.  Contributing factors include allergic rhinitis, intermittent GERD (worse when she is pregnant).  She is currently [redacted] weeks pregnant which is certainly going to make asthma harder to manage.  Not clear to me that there is truly a pattern of recurrent infection of any kind.  She had a reassuring evaluation by infectious diseases.  We need to try to achieve tighter control as possible over her contributing factors.  Singulair is pregnancy class B, should be okay to add at this point.  She wonders if she get more benefit from an alternative antihistamine to Zyrtec.   Okay to switch to loratadine.  We can also add nasal steroid going forward if he felt it was necessary.  I think she should start the Nexium empirically even though she has a really begun to have overt GERD symptoms yet.  Your methacholine challenge is consistent with asthma.  I believe you also have upper airway irritation with chronic cough that is not always related to your bronchospasm. Please continue Symbicort 2 puffs twice daily.  Remember to rinse and gargle after using. Please keep your albuterol available to use 2 puffs up to every 4 hours if needed for shortness of breath, chest tightness, wheezing Try changing your Zyrtec to loratadine 10 mg daily (Claritin) Start Singulair 10 mg each evening until next visit Start Nexium 20 mg once daily until next visit. Try to avoid throat clearing if at all possible.  Try using sugar-free candy or non-mentholated cough drops.  When he had the urge to clear her throat or cough, just swallow. Follow with Dr  or APP in 1 month.  We will want to follow you frequently through your pregnancy to try and manage your asthma and cough triggers.    , MD, PhD 02/15/2019, 5:35 PM [facility] [phone] or if no answer [phone]  

## 2019-02-15 NOTE — Assessment & Plan Note (Signed)
Asthma as confirmed by her methacholine challenge in June 2019.  She is had flaring symptoms that have been ascribed to asthma and also RSV or bronchitis that I suspect have been asthma flares with overlapping upper airway instability and chronic cough.  She is responded to steroids and antibiotics before. One episode of poor control for her was when she was pregnant with her first child.  Contributing factors include allergic rhinitis, intermittent GERD (worse when she is pregnant).  She is currently [redacted] weeks pregnant which is certainly going to make asthma harder to manage.  Not clear to me that there is truly a pattern of recurrent infection of any kind.  She had a reassuring evaluation by infectious diseases.  We need to try to achieve tighter control as possible over her contributing factors.  Singulair is pregnancy class B, should be okay to add at this point.  She wonders if she get more benefit from an alternative antihistamine to Zyrtec.  Okay to switch to loratadine.  We can also add nasal steroid going forward if he felt it was necessary.  I think she should start the Nexium empirically even though she has a really begun to have overt GERD symptoms yet.  Your methacholine challenge is consistent with asthma.  I believe you also have upper airway irritation with chronic cough that is not always related to your bronchospasm. Please continue Symbicort 2 puffs twice daily.  Remember to rinse and gargle after using. Please keep your albuterol available to use 2 puffs up to every 4 hours if needed for shortness of breath, chest tightness, wheezing Try changing your Zyrtec to loratadine 10 mg daily (Claritin) Start Singulair 10 mg each evening until next visit Start Nexium 20 mg once daily until next visit. Try to avoid throat clearing if at all possible.  Try using sugar-free candy or non-mentholated cough drops.  When he had the urge to clear her throat or cough, just swallow. Follow with Dr Lamonte Sakai  or APP in 1 month.  We will want to follow you frequently through your pregnancy to try and manage your asthma and cough triggers.

## 2019-02-15 NOTE — Patient Instructions (Addendum)
Your methacholine challenge is consistent with asthma.  I believe you also have upper airway irritation with chronic cough that is not always related to your bronchospasm. Please continue Symbicort 2 puffs twice daily.  Remember to rinse and gargle after using. Please keep your albuterol available to use 2 puffs up to every 4 hours if needed for shortness of breath, chest tightness, wheezing Try changing your Zyrtec to loratadine 10 mg daily (Claritin) Start Singulair 10 mg each evening until next visit Start Nexium 20 mg once daily until next visit. Try to avoid throat clearing if at all possible.  Try using sugar-free candy or non-mentholated cough drops.  When he had the urge to clear her throat or cough, just swallow. Follow with Dr Lamonte Sakai or APP in 1 month.  We will want to follow you frequently through your pregnancy to try and manage your asthma and cough triggers.

## 2019-02-22 MED FILL — SYMBICORT 160-4.5 MCG INH: 160-4.5 | 30 days supply | Qty: 10 | Fill #1

## 2019-03-02 MED FILL — SYMBICORT 160-4.5 MCG INH: 160-4.5 | 30 days supply | Qty: 10 | Fill #1

## 2019-03-04 ENCOUNTER — Telehealth: Payer: Self-pay | Admitting: *Deleted

## 2019-03-04 ENCOUNTER — Telehealth: Payer: Self-pay | Admitting: Women's Health

## 2019-03-04 ENCOUNTER — Encounter: Payer: 59 | Admitting: Obstetrics & Gynecology

## 2019-03-04 NOTE — Telephone Encounter (Signed)
Pt wanted to advise a provider that she is starting to have slight pain under her right ribs.

## 2019-03-04 NOTE — Telephone Encounter (Signed)
Patient states she has noticed some right upper quad pain since Wednesday.  Comes and goes and does not feel like it is related to bowel issues.  She has had a cholecystectomy in the past.  She has not checked her BP recently but denies headaches or any vision changes.  Advised to check BP and if elevated, to [facility]  Pt verbalized understanding.

## 2019-03-09 ENCOUNTER — Ambulatory Visit: Payer: 59 | Admitting: Infectious Disease

## 2019-03-09 ENCOUNTER — Encounter: Payer: 59 | Admitting: Women's Health

## 2019-03-14 MED FILL — MONTELUKAST SOD 10 MG TAB: 10 | 30 days supply | Qty: 30 | Fill #0

## 2019-03-21 ENCOUNTER — Ambulatory Visit: Payer: 59 | Admitting: Emergency Medicine

## 2019-03-23 ENCOUNTER — Other Ambulatory Visit: Payer: Self-pay

## 2019-03-23 ENCOUNTER — Encounter: Payer: Self-pay | Admitting: Advanced Practice Midwife

## 2019-03-23 ENCOUNTER — Ambulatory Visit (INDEPENDENT_AMBULATORY_CARE_PROVIDER_SITE_OTHER): Payer: 59 | Admitting: Advanced Practice Midwife

## 2019-03-23 VITALS — BP 106/63 | HR 89 | Wt 206.0 lb

## 2019-03-23 DIAGNOSIS — Z3A24 24 weeks gestation of pregnancy: Secondary | ICD-10-CM

## 2019-03-23 DIAGNOSIS — Z331 Pregnant state, incidental: Secondary | ICD-10-CM

## 2019-03-23 DIAGNOSIS — Z3492 Encounter for supervision of normal pregnancy, unspecified, second trimester: Secondary | ICD-10-CM

## 2019-03-23 DIAGNOSIS — Z1389 Encounter for screening for other disorder: Secondary | ICD-10-CM

## 2019-03-23 LAB — POCT URINALYSIS DIPSTICK OB
Blood, UA: NEGATIVE
Glucose, UA: NEGATIVE
Ketones, UA: NEGATIVE
Leukocytes, UA: NEGATIVE
Nitrite, UA: NEGATIVE
POC,PROTEIN,UA: NEGATIVE

## 2019-03-23 NOTE — Patient Instructions (Addendum)
1. Before your test, do not eat or drink anything for 8-10 hours prior to your  appointment (a small amount of water is allowed and you may take any medicines you normally take). Be sure to drink lots of water the day before. 2. When you arrive, your blood will be drawn for a 'fasting' blood sugar level.  Then you will be given a sweetened carbonated beverage to drink. You should  complete drinking this beverage within five minutes. After finishing the  beverage, you will have your blood drawn exactly 1 and 2 hours later. Having  your blood drawn on time is an important part of this test. A total of three blood  samples will be done. 3. The test takes approximately 2  hours. During the test, do not have anything to  eat or drink. Do not smoke, chew gum (not even sugarless gum) or use breath mints.  4. During the test you should remain close by and seated as much as possible and  avoid walking around. You may want to bring a book or something else to  occupy your time.  5. After your test, you may eat and drink as normal. You may want to bring a snack  to eat after the test is finished. Your provider will advise you as to the results of  this test and any follow-up if necessary  If your sugar test is positive for gestational diabetes, you will be given an phone call and further instructions discussed. If you wish to know all of your test results before your next appointment, feel free to call the office, or look up your test results on Mychart.  (The range that the lab uses for normal values of the sugar test are not necessarily the range that is used for pregnant women; if your results are within the normal range, they are definitely normal.  However, if a value is deemed "high" by the lab, it may not be too high for a pregnant woman.  We will need to discuss the results if your value(s) fall in the "high" category).     Tdap Vaccine  It is recommended that you get the Tdap vaccine during the  third trimester of EACH pregnancy to help protect your baby from getting pertussis (whooping cough)  27-36 weeks is the BEST time to do this so that you can pass the protection on to your baby. During pregnancy is better than after pregnancy, but if you are unable to get it during pregnancy it will be offered at the hospital.  You will be offered this vaccine in the office after 27 weeks.  If you do not have health insurance, you can get the vaccine from the [facility] (no appointment needed).   Everyone who will be around your baby should also be up-to-date on their vaccines. Adults (who are not pregnant) only need 1 dose of Tdap during adulthood.     Kinesiology taping for pregnancy:  Youtube has good vidoes of "how tos" for lower back, pelvic, hip pain; swelling of feet, etc

## 2019-03-23 NOTE — Progress Notes (Signed)
LOW-RISK PREGNANCY VISIT Patient name: Tammy Chapman MRN [id number]  Date of birth: 28-Oct-1990 Chief Complaint:   Routine Prenatal Visit  History of Present Illness:   Tammy Chapman is a 28 y.o. G89P1001 female at [redacted]w[redacted]d with an Estimated Date of Delivery: 07/08/19 being seen today for ongoing management of a low-risk pregnancy.  Today she reports some upper abdominal discomfort, sounds ligament.  Tips given.  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  Contractions: Not present. Vag. Bleeding: None.  Movement: Present. denies leaking of fluid. Review of Systems:   Pertinent items are noted in HPI Denies abnormal vaginal discharge w/ itching/odor/irritation, headaches, visual changes, shortness of breath, chest pain, abdominal pain, severe nausea/vomiting, or problems with urination or bowel movements unless otherwise stated above.  Pertinent History Reviewed:  Medical & Surgical Hx:   Past Medical History:  Diagnosis Date  . Asthma   . Bronchitis, mucopurulent recurrent ([facility]) 01/19/2019  . Chronic asthma 01/19/2019  . GERD (gastroesophageal reflux disease)   . Loose body of right knee 02/2013  . Mosquito bite 03/08/2013  . Recurrent otitis media of both ears 01/19/2019  . Sprain and strain of medial collateral ligament of knee 02/2013   right   Past Surgical History:  Procedure Laterality Date  . CESAREAN SECTION N/A 01/08/2017   Procedure: CESAREAN SECTION;  Surgeon: Jonnie Kind, MD;  Location: [facility];  Service: Obstetrics;  Laterality: N/A;  . CHOLECYSTECTOMY  01/20/2008  . EAR TUBE REMOVAL  2002  . ESOPHAGOGASTRODUODENOSCOPY  05/15/2011   Procedure: ESOPHAGOGASTRODUODENOSCOPY (EGD);  Surgeon: Rogene Houston, MD;  [facility];  Service: Endoscopy;  Laterality: N/A;  3:15   . KNEE ARTHROSCOPY Right 03/15/2013   Procedure: RIGHT  KNEE ARTHROSCOPY, FEMORAL PATELLA REEFING, LATERAL RELEASE, PARTIAL LATERAL MENISCECTOMY ;  Surgeon: Lorn Junes, MD;   Location: [facility];  Service: Orthopedics;  Laterality: Right;  . TONSILLECTOMY AND ADENOIDECTOMY  1999  . TYMPANOSTOMY TUBE PLACEMENT  1999, 2011   Family History  Problem Relation Age of Onset  . Diabetes Mother   . Cancer Paternal Grandfather        renal  . Diabetes Maternal Grandfather   . COPD Maternal Grandfather   . Congestive Heart Failure Maternal Grandfather   . Eclampsia Sister   . Eclampsia Maternal Aunt   . Eclampsia Maternal Aunt   . Eclampsia Maternal Aunt     Current Outpatient Medications:  .  acetaminophen (TYLENOL) 325 MG tablet, Take 650 mg by mouth every 6 (six) hours as needed for moderate pain., Disp: , Rfl:  .  aspirin 81 MG chewable tablet, Chew 162 mg by mouth daily., Disp: , Rfl:  .  Doxylamine-Pyridoxine 10-10 MG TBEC, Take 1 in am, 1 in pm and 2 at HS, Disp: 60 tablet, Rfl: 6 .  esomeprazole (NEXIUM) 20 MG capsule, Take 20 mg by mouth daily as needed (heartburn)., Disp: , Rfl:  .  loratadine (CLARITIN) 10 MG tablet, Take 10 mg by mouth daily., Disp: , Rfl:  .  montelukast (SINGULAIR) 10 MG tablet, Take 1 tablet (10 mg total) by mouth at bedtime., Disp: 30 tablet, Rfl: 11 .  prenatal vitamin w/FE, FA (PRENATAL 1 + 1) 27-1 MG TABS tablet, Take 1 tablet by mouth daily at 12 noon., Disp: 30 each, Rfl: 12 .  PROAIR HFA 108 (90 Base) MCG/ACT inhaler, , Disp: , Rfl: 0 .  SYMBICORT 160-4.5 MCG/ACT inhaler, , Disp: ,  Rfl: 12 .  promethazine (PHENERGAN) 25 MG tablet, Take 0.5-1 tablets (12.5-25 mg total) by mouth every 6 (six) hours as needed for nausea or vomiting. (Patient not taking: Reported on 03/23/2019), Disp: 30 tablet, Rfl: 0 Social History: Reviewed -  reports that she has never smoked. She has never used smokeless tobacco.  Physical Assessment:   Vitals:   03/23/19 0957  BP: 106/63  Pulse: 89  Weight: 206 lb (93.4 kg)  Body mass index is 29.56 kg/m.        Physical Examination:   General appearance: Well appearing, and in no  distress  Mental status: Alert, oriented to person, place, and time  Skin: Warm & dry  Cardiovascular: Normal heart rate noted  Respiratory: Normal respiratory effort, no distress  Abdomen: Soft, gravid, nontender  Pelvic: Cervical exam deferred         Extremities:    Fetal Status: Fetal Heart Rate (bpm): 145 Fundal Height: 25 cm Movement: Present    Results for orders placed or performed in visit on 03/23/19 (from the past 24 hour(s))  POC Urinalysis Dipstick OB   Collection Time: 03/23/19 10:00 AM  Result Value Ref Range   Color, UA     Clarity, UA     Glucose, UA Negative Negative   Bilirubin, UA     Ketones, UA neg    Spec Grav, UA     Blood, UA neg    pH, UA     POC,PROTEIN,UA Negative Negative, Trace, Small (1+), Moderate (2+), Large (3+), 4+   Urobilinogen, UA     Nitrite, UA neg    Leukocytes, UA Negative Negative   Appearance     Odor      Assessment & Plan:  1) Low-risk pregnancy G2P1001 at [redacted]w[redacted]d with an Estimated Date of Delivery: 07/08/19   2) Hx GHTN, , continue ASA   Labs/procedures/US today:   Plan:  Continue routine obstetrical care    Follow-up: Return in about 3 weeks (around 04/13/2019) for PN2/LROB.  Orders Placed This Encounter  Procedures  . POC Urinalysis Dipstick OB   Christin Fudge CNM 03/23/2019 10:13 AM

## 2019-04-06 ENCOUNTER — Ambulatory Visit (INDEPENDENT_AMBULATORY_CARE_PROVIDER_SITE_OTHER): Payer: 59 | Admitting: Emergency Medicine

## 2019-04-06 ENCOUNTER — Telehealth: Payer: Self-pay | Admitting: Emergency Medicine

## 2019-04-06 ENCOUNTER — Other Ambulatory Visit: Payer: Self-pay

## 2019-04-06 ENCOUNTER — Encounter: Payer: Self-pay | Admitting: Emergency Medicine

## 2019-04-06 DIAGNOSIS — J4540 Moderate persistent asthma, uncomplicated: Secondary | ICD-10-CM | POA: Diagnosis not present

## 2019-04-06 MED ORDER — PROAIR HFA 108 (90 BASE) MCG/ACT IN AERS: 2.0000 | INHALATION_SPRAY | RESPIRATORY_TRACT | 1 refills | Status: DC | PRN

## 2019-04-06 NOTE — Telephone Encounter (Signed)
LMTCB. Pro-air sent and instructed to run as generic.

## 2019-04-06 NOTE — Progress Notes (Signed)
Subjective:    Patient ID: Tammy Chapman, female    DOB: [birth year], 28 y.o.   MRN: [id number]   HPI 28 year old never smoker, carries a history of childhood asthma, had grown out of it. She had RSV in 2018 aftter delivery of her son. Improved with steroids. Then last year she had cough, dyspnea, chest tightness, was dx with adult asthma >>  Pulmonary function testing and methacholine challenge 01/20/2018 reviewed, shows normal initial spirometry with hyperresponsiveness to methacholine at a dose of 0.25 consistent with asthma.  Symbicort and albuterol added.   She had another rough period in Nov, Dec. She is currently [redacted] weeks pregnant (beginning March).  Currently managed on Zyrtec, Nexium 20 mg prn, Symbicort, pro-air which she uses approximately 3-4x a day. Doesn't always help completely.   She has been dealing with more dyspnea and chest tightness. Minimal congestion these days. No overt GERD sx (yet; she had this with her first pregnancy)  She saw Dr. Drucilla Schmidt 6/3 and neutral oxidative assay was normal.  Immunoglobulin screen was all normal as well.  ESR 14.  CRP slightly elevated at 12.7  ROV 04/06/2019 --this is a follow-up visit for asthma as established by lung clinical history and also methacholine challenge in June 2019.  I met her in late June when she was [redacted] weeks pregnant.  At that time we added Singulair to her existing Symbicort.  Also changed Zyrtec to loratadine, added Nexium for presumed evolving GERD as well.  Today she reports that her cough was initially better on singulair. Over the last 2 weeks has had increased dyspnea, uses her albuterol about 2-5x a day, ran out it 2 weeks. 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 Experiencing restrictive disease as well as intermittent obstruction. She does hear wheeze with exertion, sometimes wakes her from sleep. Has not had any pred since last time.    Review of Systems  Constitutional: Negative  for fever and unexpected weight change.  HENT: Negative for congestion, dental problem, ear pain, nosebleeds, postnasal drip, rhinorrhea, sinus pressure, sneezing, sore throat and trouble swallowing.   Eyes: Negative for redness and itching.  Respiratory: Positive for cough. Negative for chest tightness, shortness of breath and wheezing.   Cardiovascular: Negative for palpitations and leg swelling.  Gastrointestinal: Negative for nausea and vomiting.  Genitourinary: Negative for dysuria.  Musculoskeletal: Negative for joint swelling.  Skin: Negative for rash.  Neurological: Negative for headaches.  Hematological: Does not bruise/bleed easily.  Psychiatric/Behavioral: Negative for dysphoric mood. The patient is not nervous/anxious.     Past Medical History:  Diagnosis Date  . Asthma   . Bronchitis, mucopurulent recurrent ([place]) 01/19/2019  . Chronic asthma 01/19/2019  . GERD (gastroesophageal reflux disease)   . Loose body of right knee 02/2013  . Mosquito bite 03/08/2013  . Recurrent otitis media of both ears 01/19/2019  . Sprain and strain of medial collateral ligament of knee 02/2013   right     Family History  Problem Relation Age of Onset  . Diabetes Mother   . Cancer Paternal Grandfather        renal  . Diabetes Maternal Grandfather   . COPD Maternal Grandfather   . Congestive Heart Failure Maternal Grandfather   . Eclampsia Sister   . Eclampsia Maternal Aunt   . Eclampsia Maternal Aunt   . Eclampsia Maternal Aunt      Social History   Socioeconomic History  . Marital  status: Married    Spouse name: Not on file  . Number of children: Not on file  . Years of education: Not on file  . Highest education level: Not on file  Occupational History  . Not on file  Social Needs  . Financial resource strain: Not on file  . Food insecurity    Worry: Not on file    Inability: Not on file  . Transportation needs    Medical: Not on file    Non-medical: Not on file  Tobacco Use   . Smoking status: Never Smoker  . Smokeless tobacco: Never Used  Substance and Sexual Activity  . Alcohol use: No  . Drug use: No  . Sexual activity: Yes    Partners: Male    Birth control/protection: None  Lifestyle  . Physical activity    Days per week: Not on file    Minutes per session: Not on file  . Stress: Not on file  Relationships  . Social Herbalist on phone: Not on file    Gets together: Not on file    Attends religious service: Not on file    Active member of club or organization: Not on file    Attends meetings of clubs or organizations: Not on file    Relationship status: Not on file  . Intimate partner violence    Fear of current or ex partner: Not on file    Emotionally abused: Not on file    Physically abused: Not on file    Forced sexual activity: Not on file  Other Topics Concern  . Not on file  Social History Narrative  . Not on file     Allergies  Allergen Reactions  . Benadryl [Diphenhydramine Hcl] Shortness Of Breath    Pt states that she feels fine if she sits down, but if she is moving around after taking benadryl, she feels SOB  . Penicillins Shortness Of Breath    Has patient had a PCN reaction causing immediate rash, facial/tongue/throat swelling, SOB or lightheadedness with hypotension: yes Has patient had a PCN reaction causing severe rash involving mucus membranes or skin necrosis: no Has patient had a PCN reaction that required hospitalization: no Has patient had a PCN reaction occurring within the last 10 years: no If all of the above answers are "NO", then may proceed with Cephalosporin use.   . Adhesive [Tape] Hives  . Percocet [Oxycodone-Acetaminophen]   . Lactose Intolerance (Gi) Diarrhea and Nausea And Vomiting  . Latex Rash  . Sulfa Antibiotics Rash  . Ultram [Tramadol Hcl] Rash     Outpatient Medications Prior to Visit  Medication Sig Dispense Refill  . acetaminophen (TYLENOL) 325 MG tablet Take 650 mg by mouth  every 6 (six) hours as needed for moderate pain.    Marland Kitchen aspirin 81 MG chewable tablet Chew 162 mg by mouth daily.    . Doxylamine-Pyridoxine 10-10 MG TBEC Take 1 in am, 1 in pm and 2 at HS 60 tablet 6  . esomeprazole (NEXIUM) 20 MG capsule Take 20 mg by mouth daily as needed (heartburn).    . loratadine (CLARITIN) 10 MG tablet Take 10 mg by mouth daily.    . montelukast (SINGULAIR) 10 MG tablet Take 1 tablet (10 mg total) by mouth at bedtime. 30 tablet 11  . prenatal vitamin w/FE, FA (PRENATAL 1 + 1) 27-1 MG TABS tablet Take 1 tablet by mouth daily at 12 noon. 30 each 12  .  SYMBICORT 160-4.5 MCG/ACT inhaler   12  . PROAIR HFA 108 (90 Base) MCG/ACT inhaler   0  . promethazine (PHENERGAN) 25 MG tablet Take 0.5-1 tablets (12.5-25 mg total) by mouth every 6 (six) hours as needed for nausea or vomiting. (Patient not taking: Reported on 03/23/2019) 30 tablet 0   No facility-administered medications prior to visit.         Objective:   Physical Exam Today's Vitals   04/06/19 1420  BP: 110/76  Pulse: 73  SpO2: 99%  Weight: 209 lb (94.8 kg)  Height: 5' 10" (1.778 m)   Body mass index is 29.99 kg/m.   Gen: Pleasant, well-nourished, in no distress,  normal affect  ENT: No lesions,  mouth clear,  oropharynx clear, no postnasal drip  Neck: No JVD, some UA noise - mild  Lungs: No use of accessory muscles, no crackles or wheezing on normal respiration, no wheeze on forced expiration  Cardiovascular: RRR, heart sounds normal, no murmur or gallops, no peripheral edema  Musculoskeletal: No deformities, no cyanosis or clubbing  Neuro: alert, awake, non focal  Skin: Warm, no lesions or rash     Assessment & Plan:  Chronic asthma Marginal control, largest current exacerbater is her pregnancy.  Superimposed restriction from the baby as well.  She is out of albuterol.  Please continue Symbicort 2 puffs twice daily.  Remember to rinse and gargle after using. We will refill albuterol today.   Use 2 puffs up to every 4 hours if needed for short of breath, chest tightness, wheezing. Continue Singulair, loratadine as you have been taking them. Continue Nexium as you have been taking it. Follow with Dr. Lamonte Sakai in early November or sooner if you have any worsening in your breathing.   Baltazar Apo, MD, PhD 04/06/2019, 2:46 PM [facility] [phone] or if no answer [phone]

## 2019-04-06 NOTE — Assessment & Plan Note (Addendum)
Marginal control, largest current exacerbater is her pregnancy.  Superimposed restriction from the baby as well.  She is out of albuterol.  Please continue Symbicort 2 puffs twice daily.  Remember to rinse and gargle after using. We will refill albuterol today.  Use 2 puffs up to every 4 hours if needed for short of breath, chest tightness, wheezing. Continue Singulair, loratadine as you have been taking them. Continue Nexium as you have been taking it. Follow with Dr. Lamonte Sakai in early November or sooner if you have any worsening in your breathing.

## 2019-04-06 NOTE — Patient Instructions (Addendum)
Please continue Symbicort 2 puffs twice daily.  Remember to rinse and gargle after using. We will refill albuterol today.  Use 2 puffs up to every 4 hours if needed for short of breath, chest tightness, wheezing. Continue Singulair, loratadine as you have been taking them. Continue Nexium as you have been taking it. Follow with Dr. Lamonte Sakai in early November or sooner if you have any worsening in your breathing.

## 2019-04-07 MED FILL — ALBUTEROL SULFATE HFA 108 (: 108 (90 BAS | 33 days supply | Qty: 36 | Fill #0

## 2019-04-07 NOTE — Telephone Encounter (Signed)
ATC patient unable to reach, did leave detailed message per DPR nothing further needed at this time.

## 2019-04-13 ENCOUNTER — Other Ambulatory Visit: Payer: 59

## 2019-04-13 ENCOUNTER — Ambulatory Visit (INDEPENDENT_AMBULATORY_CARE_PROVIDER_SITE_OTHER): Payer: 59 | Admitting: Student

## 2019-04-13 ENCOUNTER — Other Ambulatory Visit: Payer: Self-pay

## 2019-04-13 VITALS — BP 110/67 | HR 93 | Wt 211.0 lb

## 2019-04-13 DIAGNOSIS — Z23 Encounter for immunization: Secondary | ICD-10-CM | POA: Diagnosis not present

## 2019-04-13 DIAGNOSIS — Z3483 Encounter for supervision of other normal pregnancy, third trimester: Secondary | ICD-10-CM

## 2019-04-13 DIAGNOSIS — Z3482 Encounter for supervision of other normal pregnancy, second trimester: Secondary | ICD-10-CM

## 2019-04-13 DIAGNOSIS — Z3A27 27 weeks gestation of pregnancy: Secondary | ICD-10-CM

## 2019-04-13 DIAGNOSIS — Z331 Pregnant state, incidental: Secondary | ICD-10-CM

## 2019-04-13 DIAGNOSIS — Z1389 Encounter for screening for other disorder: Secondary | ICD-10-CM

## 2019-04-13 LAB — POCT URINALYSIS DIPSTICK OB
Blood, UA: NEGATIVE
Glucose, UA: NEGATIVE
Ketones, UA: NEGATIVE
Leukocytes, UA: NEGATIVE
Nitrite, UA: NEGATIVE
POC,PROTEIN,UA: NEGATIVE

## 2019-04-13 NOTE — Patient Instructions (Signed)

## 2019-04-13 NOTE — Progress Notes (Signed)
   PRENATAL VISIT NOTE  Subjective:  Tammy Chapman is a 28 y.o. G2P1001 at [redacted]w[redacted]d being seen today for ongoing prenatal care.  She is currently monitored for the following issues for this low-risk pregnancy and has GERD (gastroesophageal reflux disease); Abdominal pain; Diarrhea; Knee pain; Knee stiffness; Difficulty in walking(719.7); Abnormality of gait; Infertility associated with anovulation; Anovulation; Supervision of normal pregnancy; History of gestational hypertension; Previous cesarean section; Chronic asthma; Bronchitis, mucopurulent recurrent ([place]); and Recurrent otitis media of both ears on their problem list.  Patient reports feeling like her baby is only moving once or twice a day. She felt only one movement yesterday. She said she drank something cold and laid down and only got one movement. .  Contractions: Not present. Vag. Bleeding: None.  Movement: (!) Decreased. Denies leaking of fluid.   The following portions of the patient's history were reviewed and updated as appropriate: allergies, current medications, past family history, past medical history, past social history, past surgical history and problem list.   Objective:   Vitals:   04/13/19 0900  BP: 110/67  Pulse: 93  Weight: 211 lb (95.7 kg)    Fetal Status: Fetal Heart Rate (bpm): 144 Fundal Height: 28 cm Movement: (!) Decreased     General:  Alert, oriented and cooperative. Patient is in no acute distress.  Skin: Skin is warm and dry. No rash noted.   Cardiovascular: Normal heart rate noted  Respiratory: Normal respiratory effort, no problems with respiration noted  Abdomen: Soft, gravid, appropriate for gestational age.  Pain/Pressure: Absent     Pelvic: Cervical exam deferred        Extremities: Normal range of motion.  Edema: None  Mental Status: Normal mood and affect. Normal behavior. Normal judgment and thought content.   Assessment and Plan:  Pregnancy: G2P1001 at [redacted]w[redacted]d 1. Encounter for supervision  of other normal pregnancy in second trimester 2 hour GTT today  NST reactive; baseline of 150 with mod variability and 10x 10 acels, no decels and no contractions. Reassuring NST for 27 week fetus.   Discussed infant feeding plan (bottle) and birth control (none).   She is taking her baby ASA.  2. Pregnant state, incidental  - POC Urinalysis Dipstick OB  3. Screening for genitourinary condition  - POC Urinalysis Dipstick OB  Preterm labor symptoms and general obstetric precautions including but not limited to vaginal bleeding, contractions, leaking of fluid and fetal movement were reviewed in detail with the patient. Please refer to After Visit Summary for other counseling recommendations.   Return in about 2 weeks (around 04/27/2019), or LROB.  Future Appointments  Date Time Provider [place]  04/29/2019  8:30 AM Jonnie Kind, MD [facility]  05/13/2019  9:00 AM Collene Gobble, MD [facility] None    [place], CNM

## 2019-04-13 NOTE — Progress Notes (Deleted)
-  has PCOS, so she doesn't want anything.  -taking ASA

## 2019-04-14 LAB — CBC
Hematocrit: 36.2 % (ref 34.0–46.6)
Hemoglobin: 12 g/dL (ref 11.1–15.9)
MCH: 27.7 pg (ref 26.6–33.0)
MCHC: 33.1 g/dL (ref 31.5–35.7)
MCV: 84 fL (ref 79–97)
Platelets: 171 10*3/uL (ref 150–450)
RBC: 4.33 x10E6/uL (ref 3.77–5.28)
RDW: 12 % (ref 11.7–15.4)
WBC: 8.7 10*3/uL (ref 3.4–10.8)

## 2019-04-14 LAB — RPR: RPR Ser Ql: NONREACTIVE

## 2019-04-14 LAB — HIV ANTIBODY (ROUTINE TESTING W REFLEX): HIV Screen 4th Generation wRfx: NONREACTIVE

## 2019-04-14 LAB — GLUCOSE TOLERANCE, 2 HOURS W/ 1HR
Glucose, 1 hour: 102 mg/dL (ref 65–179)
Glucose, 2 hour: 100 mg/dL (ref 65–152)
Glucose, Fasting: 85 mg/dL (ref 65–91)

## 2019-04-14 LAB — ANTIBODY SCREEN: Antibody Screen: NEGATIVE

## 2019-04-14 MED FILL — MONTELUKAST SOD 10 MG TAB: 10 | 30 days supply | Qty: 30 | Fill #1

## 2019-04-14 MED FILL — SYMBICORT 160-4.5 MCG INH: 160-4.5 | 30 days supply | Qty: 10 | Fill #2

## 2019-04-29 ENCOUNTER — Encounter: Payer: Self-pay | Admitting: Obstetrics and Gynecology

## 2019-04-29 ENCOUNTER — Ambulatory Visit (INDEPENDENT_AMBULATORY_CARE_PROVIDER_SITE_OTHER): Payer: 59 | Admitting: Obstetrics and Gynecology

## 2019-04-29 ENCOUNTER — Other Ambulatory Visit: Payer: Self-pay

## 2019-04-29 VITALS — BP 107/67 | HR 98 | Wt 212.8 lb

## 2019-04-29 DIAGNOSIS — Z3A30 30 weeks gestation of pregnancy: Secondary | ICD-10-CM

## 2019-04-29 DIAGNOSIS — Z1389 Encounter for screening for other disorder: Secondary | ICD-10-CM

## 2019-04-29 DIAGNOSIS — Z331 Pregnant state, incidental: Secondary | ICD-10-CM

## 2019-04-29 DIAGNOSIS — Z3483 Encounter for supervision of other normal pregnancy, third trimester: Secondary | ICD-10-CM

## 2019-04-29 LAB — POCT URINALYSIS DIPSTICK OB
Blood, UA: NEGATIVE
Glucose, UA: NEGATIVE
Ketones, UA: NEGATIVE
Leukocytes, UA: NEGATIVE
Nitrite, UA: NEGATIVE
POC,PROTEIN,UA: NEGATIVE

## 2019-04-29 NOTE — Progress Notes (Addendum)
Patient ID: BRYNLEI LEDUFF, female   DOB: 1990/11/20, 28 y.o.   MRN: [id number]    LOW-RISK PREGNANCY VISIT Patient name: Tammy Chapman MRN [id number]  Date of birth: 02-01-1991 Chief Complaint:   Routine Prenatal Visit  History of Present Illness:   Tammy Chapman is a 28 y.o. G19P1001 female at [redacted]w[redacted]d with an Estimated Date of Delivery: 07/08/19 being seen today for ongoing management of a low-risk pregnancy. Previous c-section, hx of gestational HTN. Penicilin causes her to have skin rash. Can take keflex and suprax. Had an allergic reaction as a child, believes she was given amoxicillin and mother told her she had rashes on her body with redness all over.  Specifically states she is able to take CAN Keflex and Suprax Today she reports some challenges at work on [address] 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  when she has to wear lots of protective gear and notices that it leads her to feel hot and blood pressure seems to drop she had a 90 systolic last week" felt it" no syncope.. Contractions: Irregular.  .  Movement: Present. denies leaking of fluid.  Review of Systems:   Pertinent items are noted in HPI Denies abnormal vaginal discharge w/ itching/odor/irritation, headaches, visual changes, shortness of breath, chest pain, abdominal pain, severe nausea/vomiting, or problems with urination or bowel movements unless otherwise stated above. Pertinent History Reviewed:  Reviewed past medical,surgical, social, obstetrical and family history.  Reviewed problem list, medications and allergies. Physical Assessment:   Vitals:   04/29/19 0835  BP: 107/67  Pulse: 98  Weight: 212 lb 12.8 oz (96.5 kg)  Body mass index is 30.53 kg/m.        Physical Examination:   General appearance: Well appearing, and in no distress  Mental status: Alert, oriented to person, place, and time  Skin: Warm & dry  Cardiovascular:  Normal heart rate noted  Respiratory: Normal respiratory effort, no distress  Abdomen: Soft, gravid, nontender  Pelvic: Cervical exam deferred         Extremities: Edema: Trace  Fetal Status: Fetal Heart Rate (bpm): 142 Fundal Height: 30 cm Movement: Present    Results for orders placed or performed in visit on 04/29/19 (from the past 24 hour(s))  POC Urinalysis Dipstick OB   Collection Time: 04/29/19  8:44 AM  Result Value Ref Range   Color, UA     Clarity, UA     Glucose, UA Negative Negative   Bilirubin, UA     Ketones, UA neg    Spec Grav, UA     Blood, UA neg    pH, UA     POC,PROTEIN,UA Negative Negative, Trace, Small (1+), Moderate (2+), Large (3+), 4+   Urobilinogen, UA     Nitrite, UA neg    Leukocytes, UA Negative Negative   Appearance     Odor      Assessment & Plan:  1) Low-risk pregnancy G2P1001 at [redacted]w[redacted]d with an Estimated Date of Delivery: 07/08/19   2) Repeat c-section, 10 am on Friday Nov 13 jvf  3/ to get flu shot thru [employer]    Meds: No orders of the defined types were placed in this encounter.  Labs/procedures today: None  Plan:   Continue routine obstetrical care  F/u in 2 weeks.   Reviewed: Preterm labor symptoms and general obstetric precautions including but not limited to vaginal bleeding, contractions,  leaking of fluid and fetal movement were reviewed in detail with the patient.  All questions were answered.   Follow-up: Return in about 2 weeks (around 05/13/2019) for Televisit.  Orders Placed This Encounter  Procedures  . POC Urinalysis Dipstick OB   By signing my name below, I, Samul Dada, attest that this documentation has been prepared under the direction and in the presence of Jonnie Kind, MD. Electronically Signed: [place]. 04/29/19. 8:56 AM.  I personally performed the services described in this documentation, which was SCRIBED in my presence. The recorded information has been reviewed and considered  accurate. It has been edited as necessary during review. Jonnie Kind, MD

## 2019-05-06 ENCOUNTER — Inpatient Hospital Stay (HOSPITAL_COMMUNITY): Payer: 59

## 2019-05-06 ENCOUNTER — Other Ambulatory Visit: Payer: Self-pay

## 2019-05-06 ENCOUNTER — Encounter (HOSPITAL_COMMUNITY): Payer: Self-pay

## 2019-05-06 ENCOUNTER — Inpatient Hospital Stay (HOSPITAL_COMMUNITY)
Admission: AD | Admit: 2019-05-06 | Discharge: 2019-05-13 | DRG: 833 | Disposition: A | Discharge status: Home / Self Care | Payer: 59 | Attending: Obstetrics and Gynecology | Admitting: Obstetrics and Gynecology

## 2019-05-06 DIAGNOSIS — O34211 Maternal care for low transverse scar from previous cesarean delivery: Secondary | ICD-10-CM | POA: Diagnosis present

## 2019-05-06 DIAGNOSIS — Z20828 Contact with and (suspected) exposure to other viral communicable diseases: Secondary | ICD-10-CM | POA: Diagnosis present

## 2019-05-06 DIAGNOSIS — Z3A31 31 weeks gestation of pregnancy: Secondary | ICD-10-CM

## 2019-05-06 DIAGNOSIS — Z23 Encounter for immunization: Secondary | ICD-10-CM | POA: Diagnosis not present

## 2019-05-06 DIAGNOSIS — O4693 Antepartum hemorrhage, unspecified, third trimester: Secondary | ICD-10-CM

## 2019-05-06 DIAGNOSIS — O34219 Maternal care for unspecified type scar from previous cesarean delivery: Secondary | ICD-10-CM | POA: Diagnosis not present

## 2019-05-06 LAB — CBC
HCT: 35.6 % — ABNORMAL LOW (ref 36.0–46.0)
Hemoglobin: 11.4 g/dL — ABNORMAL LOW (ref 12.0–15.0)
MCH: 27 pg (ref 26.0–34.0)
MCHC: 32 g/dL (ref 30.0–36.0)
MCV: 84.4 fL (ref 80.0–100.0)
Platelets: 172 10*3/uL (ref 150–400)
RBC: 4.22 MIL/uL (ref 3.87–5.11)
RDW: 12.6 % (ref 11.5–15.5)
WBC: 8.9 10*3/uL (ref 4.0–10.5)
nRBC: 0 % (ref 0.0–0.2)

## 2019-05-06 LAB — URINALYSIS, ROUTINE W REFLEX MICROSCOPIC
Bilirubin Urine: NEGATIVE
Glucose, UA: NEGATIVE mg/dL
Ketones, ur: NEGATIVE mg/dL
Leukocytes,Ua: NEGATIVE
Nitrite: NEGATIVE
Protein, ur: NEGATIVE mg/dL
Specific Gravity, Urine: 1.01 (ref 1.005–1.030)
pH: 6 (ref 5.0–8.0)

## 2019-05-06 LAB — TYPE AND SCREEN
ABO/RH(D): A POS
Antibody Screen: NEGATIVE

## 2019-05-06 LAB — SARS CORONAVIRUS 2 BY RT PCR (HOSPITAL ORDER, PERFORMED IN ~~LOC~~ HOSPITAL LAB): SARS Coronavirus 2: NEGATIVE

## 2019-05-06 MED ORDER — PRENATAL MULTIVITAMIN CH
1.0000 | ORAL_TABLET | Freq: Every day | ORAL | Status: DC
  Administered 2019-05-06 – 2019-05-12 (×7): 1 via ORAL
  Filled 2019-05-06 (×9): qty 1

## 2019-05-06 MED ORDER — INFLUENZA VAC SPLIT QUAD 0.5 ML IM SUSY
0.5000 mL | PREFILLED_SYRINGE | INTRAMUSCULAR | Status: AC
  Administered 2019-05-07: 13:00:00 0.5 mL via INTRAMUSCULAR
  Filled 2019-05-06: qty 0.5

## 2019-05-06 MED ORDER — ACETAMINOPHEN 325 MG PO TABS
650.0000 mg | ORAL_TABLET | ORAL | Status: DC | PRN
  Administered 2019-05-06 – 2019-05-10 (×5): 650 mg via ORAL
  Filled 2019-05-06 (×5): qty 2

## 2019-05-06 MED ORDER — DOCUSATE SODIUM 100 MG PO CAPS
100.0000 mg | ORAL_CAPSULE | Freq: Every day | ORAL | Status: DC
  Filled 2019-05-06 (×2): qty 1

## 2019-05-06 MED ORDER — PRENATAL MULTIVITAMIN CH: 1.0000 | ORAL_TABLET | Freq: Every day | ORAL | Status: DC

## 2019-05-06 MED ORDER — CALCIUM CARBONATE ANTACID 500 MG PO CHEW: 2.0000 | CHEWABLE_TABLET | ORAL | Status: DC | PRN

## 2019-05-06 MED ORDER — MOMETASONE FURO-FORMOTEROL FUM 200-5 MCG/ACT IN AERO
2.0000 | INHALATION_SPRAY | Freq: Two times a day (BID) | RESPIRATORY_TRACT | Status: DC
  Administered 2019-05-06 – 2019-05-12 (×13): 2 via RESPIRATORY_TRACT
  Filled 2019-05-06: qty 8.8

## 2019-05-06 MED ORDER — MONTELUKAST SODIUM 10 MG PO TABS
10.0000 mg | ORAL_TABLET | Freq: Every day | ORAL | Status: DC
  Administered 2019-05-06 – 2019-05-12 (×7): 10 mg via ORAL
  Filled 2019-05-06 (×11): qty 1

## 2019-05-06 NOTE — MAU Note (Signed)
Pt presents to MAU with c/o bleeding that started this morning. It started heavy and has decreased. She has had abdominal cramping on and off for the last few days. Pt denies LOF. +FM

## 2019-05-06 NOTE — MAU Note (Signed)
Covid swab collected. Pt tolerated well. PT asymptomatic 

## 2019-05-06 NOTE — H&P (Signed)
FACULTY PRACTICE ANTEPARTUM ADMISSION HISTORY AND PHYSICAL NOTE   History of Present Illness: Tammy Chapman is a 28 y.o. G2P1001 at [redacted]w[redacted]d admitted for third trimester vaginal bleeding. Hx of LTCS for FTP, denies complications with this pregnancy. Reports she woke up this morning with a gush of bright red blood. Bleeding has slowed down since then. Endorses some mild cramping.  In mau, spec exam showed moderate amount of blood clots. Cervix closed. Limited ultrasound shows anterior placenta without signs of previa or abruption.  Patient reports the fetal movement as active. Patient reports uterine contraction  activity as none. Patient describes fluid per vagina as None. Fetal presentation is cephalic.  Patient Active Problem List   Diagnosis Date Noted  . Third trimester bleeding 05/06/2019  . Chronic asthma 01/19/2019  . Bronchitis, mucopurulent recurrent ([place]) 01/19/2019  . Recurrent otitis media of both ears 01/19/2019  . Supervision of normal pregnancy 12/29/2018  . History of gestational hypertension 12/29/2018  . Previous cesarean section 12/29/2018  . 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 Anovulation 02/01/2016  . Knee pain 04/08/2012  . Knee stiffness 04/08/2012  . Difficulty in walking(719.7) 04/08/2012  . Abnormality of gait 04/08/2012  . GERD (gastroesophageal reflux disease) 08/04/2011  . Abdominal pain 08/04/2011  . Diarrhea 08/04/2011    Past Medical History:  Diagnosis Date  . Asthma   . Bronchitis, mucopurulent recurrent ([place]) 01/19/2019  . Chronic asthma 01/19/2019  . GERD (gastroesophageal reflux disease)   . Loose body of right knee 02/2013  . Mosquito bite 03/08/2013  . Recurrent otitis media of both ears 01/19/2019  . Sprain and strain of medial collateral ligament of knee 02/2013   right    Past Surgical History:  Procedure Laterality Date  . CESAREAN SECTION N/A 01/08/2017   Procedure: CESAREAN SECTION;  Surgeon: Jonnie Kind, MD;   Location: [facility];  Service: Obstetrics;  Laterality: N/A;  . CHOLECYSTECTOMY  01/20/2008  . EAR TUBE REMOVAL  2002  . ESOPHAGOGASTRODUODENOSCOPY  05/15/2011   Procedure: ESOPHAGOGASTRODUODENOSCOPY (EGD);  Surgeon: Rogene Houston, MD;  [facility];  Service: Endoscopy;  Laterality: N/A;  3:15   . KNEE ARTHROSCOPY Right 03/15/2013   Procedure: RIGHT  KNEE ARTHROSCOPY, FEMORAL PATELLA REEFING, LATERAL RELEASE, PARTIAL LATERAL MENISCECTOMY ;  Surgeon: Lorn Junes, MD;  Location: [facility];  Service: Orthopedics;  Laterality: Right;  . TONSILLECTOMY AND ADENOIDECTOMY  1999  . TYMPANOSTOMY TUBE PLACEMENT  1999, 2011    OB History  Gravida Para Term Preterm AB Living  2 1 1     1   SAB TAB Ectopic Multiple Live Births          1    # Outcome Date GA Lbr Len/2nd Weight Sex Delivery Anes PTL Lv  2 Current           1 Term 01/08/17 [redacted]w[redacted]d  3965 g M CS-LTranv EPI  LIV     Complications: Gestational hypertension    Social History   Socioeconomic History  . Marital status: Married    Spouse name: Not on file  . Number of children: Not on file  . Years of education: Not on file  . Highest education level: Not on file  Occupational History  . Not on file  Social Needs  . Financial resource strain: Not on file  . Food insecurity    Worry: Not on file    Inability: Not on file  . Transportation needs  Medical: Not on file    Non-medical: Not on file  Tobacco Use  . Smoking status: Never Smoker  . Smokeless tobacco: Never Used  Substance and Sexual Activity  . Alcohol use: No  . Drug use: No  . Sexual activity: Yes    Partners: Male    Birth control/protection: None  Lifestyle  . Physical activity    Days per week: Not on file    Minutes per session: Not on file  . Stress: Not on file  Relationships  . Social Herbalist on phone: Not on file    Gets together: Not on file    Attends religious service: Not on file    Active  member of club or organization: Not on file    Attends meetings of clubs or organizations: Not on file    Relationship status: Not on file  Other Topics Concern  . Not on file  Social History Narrative  . Not on file    Family History  Problem Relation Age of Onset  . Diabetes Mother   . Cancer Paternal Grandfather        renal  . Diabetes Maternal Grandfather   . COPD Maternal Grandfather   . Congestive Heart Failure Maternal Grandfather   . Eclampsia Sister   . Eclampsia Maternal Aunt   . Eclampsia Maternal Aunt   . Eclampsia Maternal Aunt     Allergies  Allergen Reactions  . Benadryl [Diphenhydramine Hcl] Shortness Of Breath    Pt states that she feels fine if she sits down, but if she is moving around after taking benadryl, she feels SOB  . Penicillins Shortness Of Breath    Has patient had a PCN reaction causing immediate rash, facial/tongue/throat swelling, SOB or lightheadedness with hypotension: yes Has patient had a PCN reaction causing severe rash involving mucus membranes or skin necrosis: no Has patient had a PCN reaction that required hospitalization: no Has patient had a PCN reaction occurring within the last 10 years: no If all of the above answers are "NO", then may proceed with Cephalosporin use.   . Adhesive [Tape] Hives  . Percocet [Oxycodone-Acetaminophen]   . Lactose Intolerance (Gi) Diarrhea and Nausea And Vomiting  . Latex Rash  . Sulfa Antibiotics Rash  . Ultram [Tramadol Hcl] Rash    Medications Prior to Admission  Medication Sig Dispense Refill Last Dose  . acetaminophen (TYLENOL) 325 MG tablet Take 650 mg by mouth every 6 (six) hours as needed for moderate pain.     Marland Kitchen aspirin 81 MG chewable tablet Chew 162 mg by mouth daily.     . Doxylamine-Pyridoxine 10-10 MG TBEC Take 1 in am, 1 in pm and 2 at HS 60 tablet 6   . esomeprazole (NEXIUM) 20 MG capsule Take 20 mg by mouth daily as needed (heartburn).     . loratadine (CLARITIN) 10 MG tablet  Take 10 mg by mouth daily.     . montelukast (SINGULAIR) 10 MG tablet Take 1 tablet (10 mg total) by mouth at bedtime. 30 tablet 11   . prenatal vitamin w/FE, FA (PRENATAL 1 + 1) 27-1 MG TABS tablet Take 1 tablet by mouth daily at 12 noon. 30 each 12   . PROAIR HFA 108 (90 Base) MCG/ACT inhaler Inhale 2 puffs into the lungs every 4 (four) hours as needed for wheezing or shortness of breath. 54 g 1   . SYMBICORT 160-4.5 MCG/ACT inhaler   12  Review of Systems - abdominal cramping & vaginal bleeding  Vitals:  BP 121/80 (BP Location: Right Arm)   Pulse 99   Temp 98.1 F (36.7 C) (Oral)   Resp 18   Wt 97.1 kg   LMP 09/22/2018 (Exact Date)   BMI 30.71 kg/m  Physical Examination: CONSTITUTIONAL: Well-developed, well-nourished female in no acute distress.  HENT:  Normocephalic, atraumatic, External right and left ear normal. Oropharynx is clear and moist EYES: Conjunctivae and EOM are normal. Pupils are equal, round, and reactive to light. No scleral icterus.  NECK: Normal range of motion, supple, no masses SKIN: Skin is warm and dry. No rash noted. Not diaphoretic. No erythema. No pallor. [place]: Alert and oriented to person, place, and time. Normal reflexes, muscle tone coordination. No cranial nerve deficit noted. PSYCHIATRIC: Normal mood and affect. Normal behavior. Normal judgment and thought content. CARDIOVASCULAR: Normal heart rate noted, regular rhythm RESPIRATORY: Effort and breath sounds normal, no problems with respiration noted ABDOMEN: Soft, nontender, nondistended, gravid. MUSCULOSKELETAL: Normal range of motion. No edema and no tenderness. 2+ distal pulses.  Cervix: Dilation: Closed Effacement (%): 30 Station: -3 Presentation: Vertex Exam by:: Hansel Feinstein, CNM Membranes:intact Fetal Monitoring:Baseline: 145 bpm, Variability: Good {> 6 bpm), Accelerations: none and Decelerations: Absent Tocometer: Flat  Labs:  Results for orders placed or performed during the  hospital encounter of 05/06/19 (from the past 24 hour(s))  Urinalysis, Routine w reflex microscopic   Collection Time: 05/06/19  7:41 AM  Result Value Ref Range   Color, Urine YELLOW YELLOW   APPearance CLEAR CLEAR   Specific Gravity, Urine 1.010 1.005 - 1.030   pH 6.0 5.0 - 8.0   Glucose, UA NEGATIVE NEGATIVE mg/dL   Hgb urine dipstick LARGE (A) NEGATIVE   Bilirubin Urine NEGATIVE NEGATIVE   Ketones, ur NEGATIVE NEGATIVE mg/dL   Protein, ur NEGATIVE NEGATIVE mg/dL   Nitrite NEGATIVE NEGATIVE   Leukocytes,Ua NEGATIVE NEGATIVE   RBC / HPF 0-5 0 - 5 RBC/hpf   WBC, UA 0-5 0 - 5 WBC/hpf   Bacteria, UA RARE (A) NONE SEEN   Squamous Epithelial / LPF 0-5 0 - 5   Mucus PRESENT     Imaging Studies: Preliminary report can be found under media tab  Assessment and Plan: 1. [redacted] weeks gestation of pregnancy   2. Vaginal bleeding in pregnancy, third trimester   -admit to obsc unit for vaginal bleeding  Jorje Guild, NP 05/06/2019 8:36 AM

## 2019-05-06 NOTE — MAU Provider Note (Addendum)
Chief Complaint:  Vaginal Bleeding   First Provider Initiated Contact with Patient 05/06/19 0734      HPI: Tammy Chapman is a 28 y.o. G2P1001 at 59w0dwho presents to maternity admissions reporting onset of heavy bleeding this morning.  Got up to bathroom and felt blood coming out.  Has had some cramping for the past few days, not severe.  .Also has low back pain She reports good fetal movement, denies LOF, vaginal itching/burning, urinary symptoms, h/a, dizziness, n/v, diarrhea, constipation or fever/chills.    RN Note: Pt presents to MAU with c/o bleeding that started this morning. It started heavy and has decreased. She has had abdominal cramping on and off for the last few days. Pt denies LOF. +FM  Past Medical History: Past Medical History:  Diagnosis Date  . Asthma   . Bronchitis, mucopurulent recurrent ([place]) 01/19/2019  . Chronic asthma 01/19/2019  . GERD (gastroesophageal reflux disease)   . Loose body of right knee 02/2013  . Mosquito bite 03/08/2013  . Recurrent otitis media of both ears 01/19/2019  . Sprain and strain of medial collateral ligament of knee 02/2013   right    Past obstetric history: OB History  Gravida Para Term Preterm AB Living  2 1 1     1   SAB TAB Ectopic Multiple Live Births          1    # Outcome Date GA Lbr Len/2nd Weight Sex Delivery Anes PTL Lv  2 Current           1 Term 01/08/17 [redacted]w[redacted]d  3965 g M CS-LTranv EPI  LIV     Complications: Gestational hypertension    Past Surgical History: Past Surgical History:  Procedure Laterality Date  . CESAREAN SECTION N/A 01/08/2017   Procedure: CESAREAN SECTION;  Surgeon: Jonnie Kind, MD;  Location: [facility];  Service: Obstetrics;  Laterality: N/A;  . CHOLECYSTECTOMY  01/20/2008  . EAR TUBE REMOVAL  2002  . ESOPHAGOGASTRODUODENOSCOPY  05/15/2011   Procedure: ESOPHAGOGASTRODUODENOSCOPY (EGD);  Surgeon: Rogene Houston, MD;  [facility];  Service: Endoscopy;  Laterality: N/A;  3:15   .  KNEE ARTHROSCOPY Right 03/15/2013   Procedure: RIGHT  KNEE ARTHROSCOPY, FEMORAL PATELLA REEFING, LATERAL RELEASE, PARTIAL LATERAL MENISCECTOMY ;  Surgeon: Lorn Junes, MD;  Location: [facility];  Service: Orthopedics;  Laterality: Right;  . TONSILLECTOMY AND ADENOIDECTOMY  1999  . TYMPANOSTOMY TUBE PLACEMENT  1999, 2011    Family History: Family History  Problem Relation Age of Onset  . Diabetes Mother   . Cancer Paternal Grandfather        renal  . Diabetes Maternal Grandfather   . COPD Maternal Grandfather   . Congestive Heart Failure Maternal Grandfather   . Eclampsia Sister   . Eclampsia Maternal Aunt   . Eclampsia Maternal Aunt   . Eclampsia Maternal Aunt     Social History: Social History   Tobacco Use  . Smoking status: Never Smoker  . Smokeless tobacco: Never Used  Substance Use Topics  . Alcohol use: No  . Drug use: No    Allergies:  Allergies  Allergen Reactions  . Benadryl [Diphenhydramine Hcl] Shortness Of Breath    Pt states that she feels fine if she sits down, but if she is moving around after taking benadryl, she feels SOB  . Penicillins Shortness Of Breath    Has patient had a PCN reaction causing immediate rash, facial/tongue/throat swelling, SOB or lightheadedness  with hypotension: yes Has patient had a PCN reaction causing severe rash involving mucus membranes or skin necrosis: no Has patient had a PCN reaction that required hospitalization: no Has patient had a PCN reaction occurring within the last 10 years: no If all of the above answers are "NO", then may proceed with Cephalosporin use.   . Adhesive [Tape] Hives  . Percocet [Oxycodone-Acetaminophen]   . Lactose Intolerance (Gi) Diarrhea and Nausea And Vomiting  . Latex Rash  . Sulfa Antibiotics Rash  . Ultram [Tramadol Hcl] Rash    Meds:  Medications Prior to Admission  Medication Sig Dispense Refill Last Dose  . acetaminophen (TYLENOL) 325 MG tablet Take 650 mg by mouth  every 6 (six) hours as needed for moderate pain.     Marland Kitchen aspirin 81 MG chewable tablet Chew 162 mg by mouth daily.     . Doxylamine-Pyridoxine 10-10 MG TBEC Take 1 in am, 1 in pm and 2 at HS 60 tablet 6   . esomeprazole (NEXIUM) 20 MG capsule Take 20 mg by mouth daily as needed (heartburn).     . loratadine (CLARITIN) 10 MG tablet Take 10 mg by mouth daily.     . montelukast (SINGULAIR) 10 MG tablet Take 1 tablet (10 mg total) by mouth at bedtime. 30 tablet 11   . prenatal vitamin w/FE, FA (PRENATAL 1 + 1) 27-1 MG TABS tablet Take 1 tablet by mouth daily at 12 noon. 30 each 12   . PROAIR HFA 108 (90 Base) MCG/ACT inhaler Inhale 2 puffs into the lungs every 4 (four) hours as needed for wheezing or shortness of breath. 54 g 1   . SYMBICORT 160-4.5 MCG/ACT inhaler   12     I have reviewed patient's Past Medical Hx, Surgical Hx, Family Hx, Social Hx, medications and allergies.   ROS:  Review of Systems  Constitutional: Negative for chills and fever.  Respiratory: Negative for shortness of breath.   Gastrointestinal: Negative for constipation, diarrhea, nausea and vomiting.  Genitourinary: Positive for pelvic pain (cramping) and vaginal bleeding.  Musculoskeletal: Positive for back pain.   Other systems negative  Physical Exam   Patient Vitals for the past 24 hrs:  BP Pulse Resp Weight  05/06/19 0726 124/72 99 18 -  05/06/19 0717 - - - 97.1 kg   Constitutional: Well-developed, well-nourished female in no acute distress.  Cardiovascular: normal rate and rhythm Respiratory: normal effort GI: Abd soft, non-tender, gravid appropriate for gestational age.   No rebound or guarding. MS: Extremities nontender, no edema, normal ROM Neurologic: Alert and oriented x 4.  GU: Neg CVAT.  PELVIC EXAM: Cervix pink, visually closed, without lesion, moderate amount of blood/clots/mucous in vault,  vaginal walls and external genitalia normal Dilation: Closed Effacement (%): 30 Station:  -3 Presentation: Vertex Exam by:: Hansel Feinstein, CNM External os is fingertip,but internal os is closed   FHT:  Baseline 140 , moderate variability, very small accelerations present, no decelerations Contractions: occasional irritability   Labs: CBC and Type and Screen ordered.  A/Positive/-- (06/19 [id number])  Imaging:  No results found.  MAU Course/MDM: I have ordered labs for admission. NST reviewed, reassuring but not reactive (though baby is only 31 weeks  Treatments in MAU included EFM.    Assessment: [place] intrauterine pregnancy at [redacted]w[redacted]d Third Trimester bleeding, spontaneous, unknown etiology  Plan: [place] ordered Care turned over to oncoming provider  Hansel Feinstein CNM, MSN Certified Nurse-Midwife 05/06/2019 7:34 AM    Reviewed patient presentation with Dr.  Constant. Will admit to [facility]. See H&P.   Jorje Guild, NP

## 2019-05-07 LAB — ABO/RH: ABO/RH(D): A POS

## 2019-05-07 MED ORDER — SUMATRIPTAN SUCCINATE 100 MG PO TABS
100.0000 mg | ORAL_TABLET | ORAL | Status: DC | PRN
  Filled 2019-05-07: qty 1

## 2019-05-07 MED ORDER — BETAMETHASONE SOD PHOS & ACET 6 (3-3) MG/ML IJ SUSP
12.0000 mg | INTRAMUSCULAR | Status: AC
  Administered 2019-05-07 – 2019-05-08 (×2): 12 mg via INTRAMUSCULAR
  Filled 2019-05-07 (×2): qty 2

## 2019-05-07 NOTE — Progress Notes (Addendum)
S. No problems except a migraine, requesting treatment. no more bleeding as of this morning. She reports good FM, no ROM, no CTXs She has heard about BMZ and is requesting it.   O. VSS AF      FHR- reassuring      Abd- benign      U/S yesterday normal  A/P. Bleeding in pregnancy at 31.1- stable currently  She requests BMZ Expectant management imitrex prn

## 2019-05-07 NOTE — Plan of Care (Signed)
Denies vaginal bleeding or complaints.

## 2019-05-08 DIAGNOSIS — Z3A31 31 weeks gestation of pregnancy: Secondary | ICD-10-CM

## 2019-05-08 DIAGNOSIS — O4693 Antepartum hemorrhage, unspecified, third trimester: Principal | ICD-10-CM

## 2019-05-08 MED ORDER — ZOLPIDEM TARTRATE 5 MG PO TABS
5.0000 mg | ORAL_TABLET | Freq: Every evening | ORAL | Status: DC | PRN
  Administered 2019-05-08 – 2019-05-12 (×6): 5 mg via ORAL
  Filled 2019-05-08 (×6): qty 1

## 2019-05-08 NOTE — Progress Notes (Signed)
Patient ID: Tammy Chapman, female   DOB: 10-01-1990, 28 y.o.   MRN: [id number] [place] COMPREHENSIVE PROGRESS NOTE  Tammy Chapman is a 28 y.o. G2P1001 at [redacted]w[redacted]d  who is admitted for third timester vaginal bleeding.   Fetal presentation is cephalic. Length of Stay:  2  Days  Subjective: Tammy Chapman has no complaints this morning. Patient reports good fetal movement.  She reports no uterine contractions, no bleeding and no loss of fluid per vagina.  Vitals:  Blood pressure 125/75, pulse 99, temperature 98.7 F (37.1 C), temperature source Oral, resp. rate 17, height 5\' 10"  (1.778 m), weight 96.3 kg, last menstrual period 09/22/2018, SpO2 100 %.   Physical Examination: Lungs clear Heart RRR Abd soft + BS gravid non tender Ext non tender  Fetal Monitoring:  baseline 120's, + accels, no decels or ut ctx  Labs:  No results found for this or any previous visit (from the past 24 hour(s)).  Imaging Studies:    NA   Medications:  Scheduled . betamethasone acetate-betamethasone sodium phosphate  12 mg Intramuscular Q24 Hr x 2  . docusate sodium  100 mg Oral Daily  . mometasone-formoterol  2 puff Inhalation BID  . montelukast  10 mg Oral QHS  . prenatal multivitamin  1 tablet Oral Q1200   I have reviewed the patient's current medications.  ASSESSMENT: IUP 31 2/7 weeks Third trimester vaginal bleeding H/O c section, desires repeat    PLAN: Stable. No bleeding since admission.  Continue routine antenatal care.   Tammy Chapman 05/08/2019,7:01 AM

## 2019-05-08 NOTE — Plan of Care (Signed)
No vaginal bleeding. FHR tracings reassuring.

## 2019-05-09 NOTE — Plan of Care (Signed)
Reports only scant dark brown vaginal drainage when she wipes.

## 2019-05-09 NOTE — Progress Notes (Signed)
Patient ID: Tammy Chapman, female   DOB: [birth year], 28 y.o.   MRN: [id number] [place] [facility] COMPREHENSIVE PROGRESS NOTE  Tammy Chapman is a 28 y.o. G2P1001 at [redacted]w[redacted]d  who is admitted for third timester vaginal bleeding.   Fetal presentation is cephalic. Length of Stay:  3  Days  Subjective: Patient is without complaints this morning. Patient reports good fetal movement.  Patient reports passage of dark clots the size of a marble throughout the day yesterday. This occurred only when she wiped or used the bathroom. She reports no uterine contractions, and no loss of fluid per vagina.  Vitals:  Blood pressure 115/65, pulse 90, temperature 98.5 F (36.9 C), temperature source Oral, resp. rate 18, height 5\' 10"  (1.778 m), weight 96.3 kg, last menstrual period 09/22/2018, SpO2 99 %.   Physical Examination: GENERAL: Well-developed, well-nourished female in no acute distress.  LUNGS: Clear to auscultation bilaterally.  HEART: Regular rate and rhythm. ABDOMEN: Soft, nontender, gravid PELVIC: Not performed EXTREMITIES: No cyanosis, clubbing, or edema, 2+ distal pulses.   Fetal Monitoring:  baseline 140, + 10 x 10 accels, no decels. No contractions  Labs:  No results found for this or any previous visit (from the past 24 hour(s)).  Imaging Studies:    NA   Medications:  Scheduled . docusate sodium  100 mg Oral Daily  . mometasone-formoterol  2 puff Inhalation BID  . montelukast  10 mg Oral QHS  . prenatal multivitamin  1 tablet Oral Q1200   I have reviewed the patient's current medications.  ASSESSMENT/PLAN: 28 to G2P1 at 31w3 with third trimester vaginal bleeding - Maternal/fetal status remains stable - Continue inpatient observation - Continue routine antenatal care.   Tammy Chapman 05/09/2019,7:19 AM

## 2019-05-10 LAB — TYPE AND SCREEN
ABO/RH(D): A POS
Antibody Screen: NEGATIVE

## 2019-05-10 NOTE — Plan of Care (Signed)
  Problem: Education: Goal: Knowledge of disease or condition will improve Outcome: Completed/Met Goal: Knowledge of the prescribed therapeutic regimen will improve Outcome: Completed/Met   Problem: Clinical Measurements: Goal: Diagnostic test results will improve Outcome: Completed/Met Goal: Cardiovascular complication will be avoided Outcome: Completed/Met   Problem: Activity: Goal: Risk for activity intolerance will decrease Outcome: Completed/Met   Problem: Nutrition: Goal: Adequate nutrition will be maintained Outcome: Completed/Met   Problem: Coping: Goal: Level of anxiety will decrease Outcome: Completed/Met

## 2019-05-10 NOTE — Progress Notes (Signed)
Patient ID: Tammy Chapman, female   DOB: August 25, 1990, 28 y.o.   MRN: [id number] [place] COMPREHENSIVE PROGRESS NOTE  Tammy Chapman is a 28 y.o. G2P1001 at [redacted]w[redacted]d  who is admitted for third trimester vaginal bleeding.   Fetal presentation is cephalic. Length of Stay:  4  Days  Subjective: [name] has no complaints this morning, She had an episode of dark vaginal discharge after wiping yesterday but no frank VB. + FM. Tolerating diet. Denies ut ctx or LOF  Vitals:  Blood pressure 122/70, pulse 84, temperature 98.6 F (37 C), temperature source Oral, resp. rate 18, height 5\' 10"  (1.778 m), weight 96.3 kg, last menstrual period 09/22/2018, SpO2 100 %.   Physical Examination: Lungs clear Heart RRR Abd soft + BS gravid non tender Ext non tender  Fetal Monitoring:  120-130's + accels, no decels or ut ctx  Labs:  Results for orders placed or performed during the hospital encounter of 05/06/19 (from the past 24 hour(s))  Type and screen [place]   Collection Time: 05/10/19  7:40 AM  Result Value Ref Range   ABO/RH(D) A POS    Antibody Screen NEG    Sample Expiration      05/13/2019,2359 Performed at [facility], [place] [address], [place], [place] [address]     Imaging Studies:    NA   Medications:  Scheduled . docusate sodium  100 mg Oral Daily  . mometasone-formoterol  2 puff Inhalation BID  . montelukast  10 mg Oral QHS  . prenatal multivitamin  1 tablet Oral Q1200   I have reviewed the patient's current medications.  ASSESSMENT: IUP 31 4/7 weeks Third trimester vaginal bleeding Prior c section for repeat  PLAN: Doing well.  Continue routine antenatal care.   Chancy Milroy 05/10/2019,10:53 AM

## 2019-05-11 NOTE — Progress Notes (Signed)
Patient ID: Tammy Chapman, female   DOB: 1991-07-25, 28 y.o.   MRN: [id number] [place] COMPREHENSIVE PROGRESS NOTE  Tammy Chapman is a 28 y.o. G2P1001 at [redacted]w[redacted]d  who is admitted for third trimester vaginal bleeding.   Fetal presentation is cephalic. Length of Stay:  5  Days  Subjective: [name] has no complaints this morning.  Patient reports good fetal movement.  She reports no uterine contractions, no bleeding and no loss of fluid per vagina.  Vitals:  Blood pressure 113/68, pulse (!) 101, temperature 98 F (36.7 C), temperature source Oral, resp. rate 18, height 5\' 10"  (1.778 m), weight 96.3 kg, last menstrual period 09/22/2018, SpO2 99 %.   Physical Examination: Lungs clear Heart RRR Abd soft + BS gravid non tender Ext non tender  Fetal Monitoring:  140-150's, + accels, no decels or ut ctx  Labs:  No results found for this or any previous visit (from the past 24 hour(s)).  Imaging Studies:    NA   Medications:  Scheduled . docusate sodium  100 mg Oral Daily  . mometasone-formoterol  2 puff Inhalation BID  . montelukast  10 mg Oral QHS  . prenatal multivitamin  1 tablet Oral Q1200   I have reviewed the patient's current medications.  ASSESSMENT: IUP 31 2/7 weeks Third trimester vaginal bleeding Prior c section for repeat  PLAN: Stable. Plan for discharge home on Friday.  Continue routine antenatal care.   Chancy Milroy 05/11/2019,8:53 AM

## 2019-05-12 NOTE — Progress Notes (Signed)
Patient ID: Tammy Chapman, female   DOB: 07/17/1991, 28 y.o.   MRN: [id number] [place] COMPREHENSIVE PROGRESS NOTE  Tammy Chapman is a 28 y.o. G2P1001 at [redacted]w[redacted]d  who is admitted for third timester vaginal bleeding.   Fetal presentation is cephalic. Length of Stay:  6  Days  Subjective: Patient is without complaints this morning. Patient reports good fetal movement.  She reports no uterine contractions, and no loss of fluid per vagina.  Vitals:  Blood pressure (!) 98/47, pulse 73, temperature 98.8 F (37.1 C), temperature source Oral, resp. rate 18, height 5\' 10"  (1.778 m), weight 96.3 kg, last menstrual period 09/22/2018, SpO2 97 %.   Physical Examination: GENERAL: Well-developed, well-nourished female in no acute distress.  LUNGS: Clear to auscultation bilaterally.  HEART: Regular rate and rhythm. ABDOMEN: Soft, nontender, gravid PELVIC: Not performed EXTREMITIES: No cyanosis, clubbing, or edema, 2+ distal pulses.   Fetal Monitoring:  baseline 140, + 10 x 10 accels, no decels. No contractions  Labs:  No results found for this or any previous visit (from the past 24 hour(s)).  Imaging Studies:    [place] Mfm Ob Limited  Result Date: 05/06/2019 ----------------------------------------------------------------------  OBSTETRICS REPORT                       (Signed Final 05/06/2019 11:10 am) ---------------------------------------------------------------------- Patient Info  ID #:       [id number]                          D.O.B.:  15-Apr-1991 (28 yrs)  Name:       Tammy Chapman                  Visit Date: 05/06/2019 07:55 am ---------------------------------------------------------------------- Performed By  Performed By:     Corky Crafts             Ref. Address:     [address]                    [place]                                                             [place]  Attending:        Johnell Comings MD         [facility]  Referred By:      Seabron Spates CNM ---------------------------------------------------------------------- Orders   #  Description                          Code         Ordered By   1  [place] MFM OB LIMITED                    [phone]     Hansel Feinstein  ----------------------------------------------------------------------   #  Order #                    Accession #                 Episode #   1  [id number]                  [id number]                  [id number]  ---------------------------------------------------------------------- Indications   Vaginal bleeding in pregnancy, third trimester O46.93   History of cesarean delivery, currently        O34.219   pregnant   [redacted] weeks gestation of pregnancy                Z3A.31  ---------------------------------------------------------------------- Vital Signs  Weight (lb): 214                               Height:        5'10"  BMI:         30.7 ---------------------------------------------------------------------- Fetal Evaluation  Num Of Fetuses:         1  Fetal Heart Rate(bpm):  140  Cardiac Activity:       Observed  Presentation:           Cephalic  Placenta:               No abruption or previa seen, Anterior  Amniotic Fluid  AFI FV:      Within normal limits  AFI Sum(cm)     %Tile       Largest Pocket(cm)  9.14            8           3.5  RUQ(cm)       RLQ(cm)       LUQ(cm)        LLQ(cm)  1.25          3.5           2.67           1.72 ---------------------------------------------------------------------- OB History  Gravidity:    2         Term:   1  Living:       1 ---------------------------------------------------------------------- Gestational Age  LMP:           32w 2d        Date:  09/22/18                 EDD:   06/29/19  Clinical EDD:  Burke Keels 0d  EDD:   07/08/19  Best:          31w 0d     Det. By:  Clinical EDD             EDD:   07/08/19 ---------------------------------------------------------------------- Cervix Uterus Adnexa  Cervix  Not visualized (advanced GA >24wks) ---------------------------------------------------------------------- Comments  This patient was seen for a limited ultrasound due to vaginal  bleeding which has resolved.  The fetus appears to be in the vertex presentation.  Th total  AFI measured 9.14 cm.  There were no signs of a placenta previa noted on today's  exam. ----------------------------------------------------------------------                   Johnell Comings, MD Electronically Signed Final Report   05/06/2019 11:10 am ----------------------------------------------------------------------    Medications:  Scheduled . docusate sodium  100 mg Oral Daily  . mometasone-formoterol  2 puff Inhalation BID  . montelukast  10 mg Oral QHS  . prenatal multivitamin  1 tablet Oral Q1200   I have reviewed the patient's current medications.  ASSESSMENT/PLAN: 28 to G2P1 at 31w6 with third trimester vaginal bleeding - Maternal/fetal status remains stable - Continue inpatient observation - Continue routine antenatal care. - Discharge tomorrow if remains stable.   Verita Schneiders, MD 05/12/2019,7:06 AM

## 2019-05-13 ENCOUNTER — Encounter: Payer: 59 | Admitting: Obstetrics and Gynecology

## 2019-05-13 ENCOUNTER — Ambulatory Visit: Payer: 59 | Admitting: Emergency Medicine

## 2019-05-13 NOTE — Discharge Summary (Signed)
Patient ID: Tammy Chapman MRN: [id number] DOB/AGE: May 29, 1991 28 y.o.  Admit date: 05/06/2019 Discharge date: 05/13/2019  Admission Diagnoses: IUP 31 0/7 weeks, vaginal bleeding  Discharge Diagnoses: IUP 32 0/7 weeks vaginal bleeding resolved, undeliveried  Prenatal Procedures: NST  Consults:   Hospital Course:  This is a 28 y.o. G2P1001 with IUP at [redacted]w[redacted]d admitted for above Dx. Pt was observed for 7 days and had no further vaginal bleeding during her hospitalization. She was observed, fetal heart rate monitoring remained reassuring, and she had no signs/symptoms of progressing preterm labor or other maternal-fetal concerns.  Her cervical exam was unchanged from admission.  She was deemed stable for discharge to home with outpatient follow up. Discharge medications, follow up and instructions reviewed with pt. Pt verbalized understanding  Discharge Exam: Temp:  [98.7 F (37.1 C)-99.1 F (37.3 C)] 98.8 F (37.1 C) (09/25 0816) Pulse Rate:  [81-117] 117 (09/25 0816) Resp:  [18-20] 18 (09/25 0816) BP: (108-132)/(64-70) 111/64 (09/25 0816) SpO2:  [97 %-99 %] 98 % (09/25 0816)   Physical Examination: Lungs clear Heart RRR  Abd soft + BS gravid non tender Ext non tender CERVIX: Dilation: Closed Effacement (%): 30 Station: -3 Presentation: Vertex Exam by:: Hansel Feinstein, CNM  Fetal monitoring: FHR: 1140-150's bpm, Variability: moderate, Accelerations: Present, Decelerations: Absent  Uterine activity: no contractions per hour  Significant Diagnostic Studies:  Results for orders placed or performed during the hospital encounter of 05/06/19 (from the past 168 hour(s))  Type and screen [place]   Collection Time: 05/10/19  7:40 AM  Result Value Ref Range   ABO/RH(D) A POS    Antibody Screen NEG    Sample Expiration      05/13/2019,2359 Performed at [facility], [place] [address] [place],  [address]     Discharge Condition:  Stable  Disposition: Discharge disposition: 01-Home or Self Care        Discharge Instructions    Discharge activity:  No Restrictions   Complete by: As directed    Discharge diet:  No restrictions   Complete by: As directed    Do not have sex or do anything that might make you have an orgasm   Complete by: As directed    Fetal Kick Count:  Lie on our left side for one hour after a meal, and count the number of times your baby kicks.  If it is less than 5 times, get up, move around and drink some juice.  Repeat the test 30 minutes later.  If it is still less than 5 kicks in an hour, notify your doctor.   Complete by: As directed    Notify physician for a general feeling that "something is not right"   Complete by: As directed    Notify physician for increase or change in vaginal discharge   Complete by: As directed    Notify physician for intestinal cramps, with or without diarrhea, sometimes described as "gas pain"   Complete by: As directed    Notify physician for leaking of fluid   Complete by: As directed    Notify physician for low, dull backache, unrelieved by heat or Tylenol   Complete by: As directed    Notify physician for menstrual like cramps   Complete by: As directed    Notify physician for pelvic pressure   Complete by: As directed    Notify physician for uterine contractions.  These may be painless and feel like the uterus is  tightening or the baby is  "balling up"   Complete by: As directed    Notify physician for vaginal bleeding   Complete by: As directed    PRETERM LABOR:  Includes any of the follwing symptoms that occur between 20 - [redacted] weeks gestation.  If these symptoms are not stopped, preterm labor can result in preterm delivery, placing your baby at risk   Complete by: As directed      Allergies as of 05/13/2019      Reactions   Benadryl [diphenhydramine Hcl] Shortness Of Breath   Pt states that she feels fine if she sits down, but if she is moving  around after taking benadryl, she feels SOB   Penicillins Shortness Of Breath   Has patient had a PCN reaction causing immediate rash, facial/tongue/throat swelling, SOB or lightheadedness with hypotension: yes Has patient had a PCN reaction causing severe rash involving mucus membranes or skin necrosis: no Has patient had a PCN reaction that required hospitalization: no Has patient had a PCN reaction occurring within the last 10 years: no If all of the above answers are "NO", then may proceed with Cephalosporin use.   Adhesive [tape] Hives   Percocet [oxycodone-acetaminophen]    Lactose Intolerance (gi) Diarrhea, Nausea And Vomiting   Latex Rash   Sulfa Antibiotics Rash   Ultram [tramadol Hcl] Rash      Medication List    STOP taking these medications   Doxylamine-Pyridoxine 10-10 MG Tbec     TAKE these medications   acetaminophen 325 MG tablet Commonly known as: TYLENOL Take 650 mg by mouth every 6 (six) hours as needed for moderate pain.   aspirin 81 MG chewable tablet Chew 162 mg by mouth daily.   esomeprazole 20 MG capsule Commonly known as: NEXIUM Take 20 mg by mouth daily as needed (heartburn).   loratadine 10 MG tablet Commonly known as: CLARITIN Take 10 mg by mouth daily.   montelukast 10 MG tablet Commonly known as: SINGULAIR Take 1 tablet (10 mg total) by mouth at bedtime.   prenatal vitamin w/FE, FA 27-1 MG Tabs tablet Take 1 tablet by mouth daily at 12 noon.   ProAir HFA 108 (90 Base) MCG/ACT inhaler Generic drug: albuterol Inhale 2 puffs into the lungs every 4 (four) hours as needed for wheezing or shortness of breath.   Symbicort 160-4.5 MCG/ACT inhaler Generic drug: budesonide-formoterol Inhale 2 puffs into the lungs 2 (two) times daily.      Follow-up Information    [facility] Follow up.   Specialty: Obstetrics and Gynecology Why: Pt already has follow up appt Wednesday September 30 Contact information: [place] [phone]          Signed: Chancy Milroy M.D. 05/13/2019, 9:40 AM

## 2019-05-13 NOTE — Discharge Instructions (Signed)
Vaginal Bleeding During Pregnancy, Third Trimester  A small amount of bleeding from the vagina (spotting) is relatively common during pregnancy. Various things can cause bleeding or spotting during pregnancy. Sometimes bleeding is normal and is not a problem. However, bleeding during the third trimester can also be a sign of something serious for the mother and the baby. Be sure to tell your health care provider about any vaginal bleeding right away. Some possible causes of vaginal bleeding during the third trimester include:  Infection or growths (polyps) on the cervix.  A condition in which the placenta partially or completely covers the opening of the cervix inside the uterus (placenta previa).  The placenta separating from the uterus (placenta abruption).  The start of labor (discharging of the mucus plug).  A condition in which the placenta grows into the muscle layer of the uterus (placenta accreta). Follow these instructions at home: Activity  Follow instructions from your health care provider about limiting your activity. If your health care provider recommends activity restriction, you may need to stay in bed and only get up to use the bathroom. In some cases, your health care provider may allow you to continue light activity.  If needed, make plans for someone to help with your regular activities.  Ask your health care provider if it is safe for you to drive.  Do not lift anything that is heavier than 10 lb (4.5 kg), or the limit that your health care provider tells you, until he or she says that it is safe.  Do not have sex or orgasms until your health care provider says that this is safe. Medicines  Take over-the-counter and prescription medicines only as told by your health care provider.  Do not take aspirin because it can cause bleeding. General instructions  Pay attention to any changes in your symptoms.  Write down how many pads you use each day, how often you  change pads, and how soaked (saturated) they are.  Do not use tampons or douche.  If you pass any tissue from your vagina, save the tissue so you can show it to your health care provider.  Keep all follow-up visits as told by your health care provider. This is important. Contact a health care provider if:  You have vaginal bleeding during any part of your pregnancy.  You have cramps or labor pains.  You have a fever. Get help right away if:  You have severe cramps or pain in your back or abdomen.  You have a gush of fluid from the vagina.  You pass large clots or a large amount of tissue from your vagina.  Your bleeding increases.  You feel light-headed or weak.  You faint.  You feel that your baby is moving less than usual, or not moving at all. Summary  Various things can cause bleeding or spotting in pregnancy.  Bleeding during the third trimester can be a sign of a serious problem for the mother and the baby.  Be sure to tell your health care provider about any vaginal bleeding right away. This information is not intended to replace advice given to you by your health care provider. Make sure you discuss any questions you have with your health care provider. Document Released: 10/25/2002 Document Revised: 11/23/2018 Document Reviewed: 11/06/2016 Elsevier Patient Education  2020 [employer].

## 2019-05-13 NOTE — Progress Notes (Signed)
Pt d/c'd home. D/C teaching, home care, reasons to seek care, and s/s of PTL discussed. Pt verbalized understanding.

## 2019-05-16 DIAGNOSIS — J45909 Unspecified asthma, uncomplicated: Secondary | ICD-10-CM | POA: Diagnosis not present

## 2019-05-16 DIAGNOSIS — J40 Bronchitis, not specified as acute or chronic: Secondary | ICD-10-CM | POA: Diagnosis not present

## 2019-05-16 MED FILL — MONTELUKAST SOD 10 MG TAB: 10 | 30 days supply | Qty: 30 | Fill #2

## 2019-05-17 ENCOUNTER — Other Ambulatory Visit: Payer: Self-pay

## 2019-05-17 ENCOUNTER — Telehealth: Payer: Self-pay | Admitting: *Deleted

## 2019-05-17 ENCOUNTER — Ambulatory Visit (INDEPENDENT_AMBULATORY_CARE_PROVIDER_SITE_OTHER): Payer: 59 | Admitting: *Deleted

## 2019-05-17 VITALS — BP 110/67 | HR 88

## 2019-05-17 DIAGNOSIS — Z3483 Encounter for supervision of other normal pregnancy, third trimester: Secondary | ICD-10-CM

## 2019-05-17 DIAGNOSIS — Z331 Pregnant state, incidental: Secondary | ICD-10-CM

## 2019-05-17 DIAGNOSIS — Z1389 Encounter for screening for other disorder: Secondary | ICD-10-CM

## 2019-05-17 LAB — POCT URINALYSIS DIPSTICK OB
Blood, UA: NEGATIVE
Glucose, UA: NEGATIVE
Leukocytes, UA: NEGATIVE
Nitrite, UA: NEGATIVE

## 2019-05-17 NOTE — Patient Instructions (Signed)
For Headaches:   Stay well hydrated, drink enough water so that your urine is clear, sometimes if you are dehydrated you can get headaches  Eat small frequent meals and snacks, sometimes if you are hungry you can get headaches  Sometimes you get headaches during pregnancy from the pregnancy hormones  You can try tylenol (1-2 regular strength 325mg  or 1-2 extra strength 500mg ) as directed on the box. The least amount of medication that works is best.   Cool compresses (cool wet washcloth or ice pack) to area of head that is hurting  You can also try drinking a caffeinated drink to see if this will help  If not helping, try below:  For Prevention of Headaches/Migraines:  CoQ10 100mg  three times daily  Vitamin B2 400mg  daily  Magnesium Oxide 400-600mg  daily  If You Get a Bad Headache/Migraine:  Benadryl 25mg    Magnesium Oxide  1 large Gatorade  2 extra strength Tylenol (1,000mg  total)  1 cup coffee or Coke  If this doesn't help please call us @ [phone]

## 2019-05-17 NOTE — Progress Notes (Signed)
   NURSE VISIT- BLOOD PRESSURE CHECK  SUBJECTIVE:  Tammy Chapman is a 28 y.o. G2P1001 female here for BP check. She is [redacted]w[redacted]d pregnant    HYPERTENSION ROS:  Pregnant/postpartum:  . Severe headaches that don't go away with tylenol/other medicines: Yes  . Visual changes (seeing spots/double/blurred vision) No  . Severe pain under right breast breast or in center of upper chest No  . Severe nausea/vomiting No  . Taking medicines as instructed not applicable   OBJECTIVE:  BP 110/67 (BP Location: Right Arm, Patient Position: Sitting, Cuff Size: Normal)   Pulse 88   LMP 09/22/2018 (Exact Date)   Appearance alert, well appearing, and in no distress and oriented to person, place, and time.  ASSESSMENT: Pregnancy [redacted]w[redacted]d  blood pressure check  PLAN: Discussed with Knute Neu, CNM, [facility]   Recommendations: no changes needed. Follow-up: as scheduled   Alice Rieger  05/17/2019 11:49 AM

## 2019-05-17 NOTE — Telephone Encounter (Signed)
Patient states she woke up with a headache this morning and has taken Tylenol but it has not resolved.  She has also noticed right upper gastric pain as well.  She does not have a BP cuff.  Advised to come to the office for a BP check.  Verbalized understanding.

## 2019-05-17 NOTE — Telephone Encounter (Signed)
Called with complaints of headache and right upper gastric pain.

## 2019-05-18 ENCOUNTER — Ambulatory Visit (INDEPENDENT_AMBULATORY_CARE_PROVIDER_SITE_OTHER): Payer: 59 | Admitting: Advanced Practice Midwife

## 2019-05-18 VITALS — BP 120/71 | HR 112 | Wt 214.6 lb

## 2019-05-18 DIAGNOSIS — Z1389 Encounter for screening for other disorder: Secondary | ICD-10-CM

## 2019-05-18 DIAGNOSIS — Z3482 Encounter for supervision of other normal pregnancy, second trimester: Secondary | ICD-10-CM | POA: Diagnosis not present

## 2019-05-18 DIAGNOSIS — Z3483 Encounter for supervision of other normal pregnancy, third trimester: Secondary | ICD-10-CM | POA: Diagnosis not present

## 2019-05-18 DIAGNOSIS — Z331 Pregnant state, incidental: Secondary | ICD-10-CM

## 2019-05-18 DIAGNOSIS — Z3A32 32 weeks gestation of pregnancy: Secondary | ICD-10-CM

## 2019-05-18 LAB — POCT URINALYSIS DIPSTICK OB
Blood, UA: NEGATIVE
Glucose, UA: NEGATIVE
Ketones, UA: NEGATIVE
Leukocytes, UA: NEGATIVE
Nitrite, UA: NEGATIVE
POC,PROTEIN,UA: NEGATIVE

## 2019-05-18 NOTE — Patient Instructions (Signed)
Vaginal Bleeding During Pregnancy, Third Trimester  A small amount of bleeding (spotting) from the vagina is common during pregnancy. Sometimes the bleeding is normal and is not a problem, and sometimes it is a sign of something serious. Tell your doctor about any bleeding from your vagina right away. Follow these instructions at home: Activity  Follow your doctor's instructions about how active you can be. Your doctor may recommend that you: ? Stay in bed and only get up to use the bathroom. ? Continue light activity.  If needed, make plans for someone to help you with your normal activities.  Ask your doctor if it is safe for you to drive.  Do not lift anything that is heavier than 10 lb (4.5 kg) until your doctor says that this is safe.  Do not have sex or orgasms until your doctor says that this is safe. Medicines  Take over-the-counter and prescription medicines only as told by your doctor.  Do not take aspirin. It can cause bleeding. General instructions  Watch your condition for any changes.  Write down: ? The number of pads you use each day. ? How often you change pads. ? How soaked (saturated) your pads are.  Do not use tampons.  Do not douche.  If you pass any tissue from your vagina, save the tissue to show your doctor.  Keep all follow-up visits as told by your doctor. This is important. Contact a doctor if:  You have vaginal bleeding at any time during pregnancy.  You have cramps.  You have a fever. Get help right away if:  You have very bad cramps.  You have very bad pain in your back or belly (abdomen).  You have a gush of fluid from your vagina.  You pass large clots or a lot of tissue from your vagina.  Your bleeding gets worse.  You feel light-headed or weak.  You pass out (faint).  Your baby is moving less than usual, or not moving at all. Summary  Tell your doctor about any bleeding from your vagina right away.  Follow instructions  from your doctor about how active you can be. You may need someone to help you with your normal activities. This information is not intended to replace advice given to you by your health care provider. Make sure you discuss any questions you have with your health care provider. Document Released: 12/19/2013 Document Revised: 11/23/2018 Document Reviewed: 11/05/2016 Elsevier Patient Education  2020 [employer].

## 2019-05-18 NOTE — Progress Notes (Signed)
HIGH-RISK PREGNANCY VISIT Patient name: Tammy Chapman MRN [id number]  Date of birth: April 04, 1991 Chief Complaint:   Routine Prenatal Visit  History of Present Illness:   Tammy Chapman is a 28 y.o. G44P1001 female at [redacted]w[redacted]d with an Estimated Date of Delivery: 07/08/19 being seen today for ongoing management of a high-risk pregnancy complicated by third trimester vag bldg requiring hospitalization and prev C/S.  Today she reports occasional contractions. Contractions: Irritability. Vag. Bleeding: None.  Movement: Present. denies leaking of fluid.  Review of Systems:   Pertinent items are noted in HPI Denies abnormal vaginal discharge w/ itching/odor/irritation, headaches, visual changes, shortness of breath, chest pain, abdominal pain, severe nausea/vomiting, or problems with urination or bowel movements unless otherwise stated above. Pertinent History Reviewed:  Reviewed past medical,surgical, social, obstetrical and family history.  Reviewed problem list, medications and allergies. Physical Assessment:   Vitals:   05/18/19 0842  BP: 120/71  Pulse: (!) 112  Weight: 214 lb 9.6 oz (97.3 kg)  Body mass index is 30.79 kg/m.           Physical Examination:   General appearance: alert, well appearing, and in no distress  Mental status: alert, oriented to person, place, and time  Skin: warm & dry   Extremities: Edema: Trace    Cardiovascular: normal heart rate noted  Respiratory: normal respiratory effort, no distress  Abdomen: gravid, soft, non-tender  Pelvic: Cervical exam deferred         Fetal Status: Fetal Heart Rate (bpm): 140 Fundal Height: 31 cm Movement: Present     Results for orders placed or performed in visit on 05/18/19 (from the past 24 hour(s))  POC Urinalysis Dipstick OB   Collection Time: 05/18/19  8:48 AM  Result Value Ref Range   Color, UA     Clarity, UA     Glucose, UA Negative Negative   Bilirubin, UA     Ketones, UA neg    Spec Grav, UA     Blood, UA neg     pH, UA     POC,PROTEIN,UA Negative Negative, Trace, Small (1+), Moderate (2+), Large (3+), 4+   Urobilinogen, UA     Nitrite, UA neg    Leukocytes, UA Negative Negative   Appearance     Odor    Results for orders placed or performed in visit on 05/17/19 (from the past 24 hour(s))  POC Urinalysis Dipstick OB   Collection Time: 05/17/19 11:30 AM  Result Value Ref Range   Color, UA     Clarity, UA     Glucose, UA Negative Negative   Bilirubin, UA     Ketones, UA eg    Spec Grav, UA     Blood, UA neg    pH, UA     POC,PROTEIN,UA Trace Negative, Trace, Small (1+), Moderate (2+), Large (3+), 4+   Urobilinogen, UA     Nitrite, UA neg    Leukocytes, UA Negative Negative   Appearance     Odor      Assessment & Plan:  1) High-risk pregnancy G2P1001 at [redacted]w[redacted]d with an Estimated Date of Delivery: 07/08/19   2) Third tri vag bldg, stable- none since d/c home from Antenatal on 05/13/19. Offered cx exam due to intermittent ctx, but did not recommend due to previous bleed. Cx was closed/30 during her Antenatal stay. Pt agreeable to defer exam today and will call with reg preterm ctx.  3) Prev C/S, scheduled for repeat with Dr Glo Herring 07/01/19  Meds: No orders of the defined types were placed in this encounter.   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  Reviewed: Preterm labor symptoms and general obstetric precautions including but not limited to vaginal bleeding, contractions, leaking of fluid and fetal movement were reviewed in detail with the patient.  All questions were answered.   Follow-up: Return in about 2 weeks (around 06/01/2019) for HROB with Ferg.  Orders Placed This Encounter  Procedures  . POC Urinalysis Dipstick OB   Myrtis Ser [facility] 05/18/2019 10:02 AM

## 2019-05-19 ENCOUNTER — Encounter: Payer: Self-pay | Admitting: *Deleted

## 2019-05-19 DIAGNOSIS — Z029 Encounter for administrative examinations, unspecified: Secondary | ICD-10-CM

## 2019-05-26 ENCOUNTER — Other Ambulatory Visit: Payer: Self-pay | Admitting: Advanced Practice Midwife

## 2019-05-26 MED ORDER — MAGIC MOUTHWASH: 5.0000 mL | Freq: Four times a day (QID) | ORAL | 2 refills | Status: DC | PRN

## 2019-05-26 MED FILL — CMPD 1:1:1:3MAX:DPH:NYS:LID: 6 days supply | Qty: 120 | Fill #0

## 2019-05-26 NOTE — Progress Notes (Signed)
Magic mouthwash for oral thrush

## 2019-05-26 NOTE — Progress Notes (Signed)
Magic mouthwash for thrush

## 2019-05-31 ENCOUNTER — Ambulatory Visit (INDEPENDENT_AMBULATORY_CARE_PROVIDER_SITE_OTHER): Payer: 59 | Admitting: Obstetrics and Gynecology

## 2019-05-31 ENCOUNTER — Ambulatory Visit (HOSPITAL_COMMUNITY)
Admission: RE | Admit: 2019-05-31 | Discharge: 2019-05-31 | Disposition: A | Discharge status: Home / Self Care | Payer: 59 | Source: Ambulatory Visit | Attending: Obstetrics and Gynecology | Admitting: Obstetrics and Gynecology

## 2019-05-31 ENCOUNTER — Other Ambulatory Visit: Payer: Self-pay

## 2019-05-31 ENCOUNTER — Encounter: Payer: Self-pay | Admitting: Obstetrics and Gynecology

## 2019-05-31 VITALS — BP 113/74 | HR 108 | Wt 218.0 lb

## 2019-05-31 DIAGNOSIS — Z3483 Encounter for supervision of other normal pregnancy, third trimester: Secondary | ICD-10-CM

## 2019-05-31 DIAGNOSIS — Z1389 Encounter for screening for other disorder: Secondary | ICD-10-CM

## 2019-05-31 DIAGNOSIS — Z3A34 34 weeks gestation of pregnancy: Secondary | ICD-10-CM

## 2019-05-31 DIAGNOSIS — R058 Other specified cough: Secondary | ICD-10-CM

## 2019-05-31 DIAGNOSIS — Z331 Pregnant state, incidental: Secondary | ICD-10-CM

## 2019-05-31 DIAGNOSIS — R05 Cough: Secondary | ICD-10-CM

## 2019-05-31 LAB — POCT URINALYSIS DIPSTICK OB
Blood, UA: NEGATIVE
Glucose, UA: NEGATIVE
Ketones, UA: NEGATIVE
Leukocytes, UA: NEGATIVE
Nitrite, UA: NEGATIVE
POC,PROTEIN,UA: NEGATIVE

## 2019-05-31 MED ORDER — HYDROCOD POLST-CPM POLST ER 10-8 MG/5ML PO SUER: 5.0000 mL | Freq: Two times a day (BID) | ORAL | 0 refills | Status: DC | PRN

## 2019-05-31 MED ORDER — HYDROCOD POLST-CPM POLST ER 10-8 MG/5ML PO SUER: 5.0000 mL | Freq: Two times a day (BID) | ORAL | Status: DC

## 2019-05-31 NOTE — Progress Notes (Signed)
Patient ID: Tammy Chapman, female   DOB: February 17, 1991, 28 y.o.   MRN: [id number]    [facility] PREGNANCY VISIT Patient name: Tammy Chapman MRN [id number]  Date of birth: 1991/05/20 Chief Complaint:   2 Week Follow-up  History of Present Illness:   Tammy Chapman is a 28 y.o. G36P1001 female at [redacted]w[redacted]d with an Estimated Date of Delivery: 07/08/19 being seen today for ongoing management of a high-risk pregnancy complicated by hx vaginal bleeding in 3rd trimester. Pt admitted to [facility] for vaginal bleeding and cramping 09/18-25/2020. While in the hospital she says that she didn't receive her asthma medication and has gotten worse since leaving. She contracted pneumonia afterwards as well. Had Seen Dr. Luan Pulling 2 weeks ago and was treated for her asthma, with steroids, symbicort, and steroid course.. She can tell a noticeable difference between mediaction controlled vs uncontrolled, currently feels uncontrolled.. She had been using her rescue inhaler more and more. She would like to have her c-section moved up if possible, infomed that 39 wk is optimal No chest pain. Today she reports SOB & cough due asthma. Contractions: Regular.  .  Movement: Present. denies leaking of fluid.  Review of Systems:   Pertinent items are noted in HPI Denies abnormal headaches, visual changes, chest pain, abdominal pain, severe nausea/vomiting, or problems with urination or bowel movements unless otherwise stated above. Pertinent History Reviewed:  Reviewed past medical,surgical, social, obstetrical and family history.  Reviewed problem list, medications and allergies. Physical Assessment:   Vitals:   05/31/19 1349  BP: 113/74  Pulse: (!) 108  Weight: 218 lb (98.9 kg)  Body mass index is 31.28 kg/m.           Physical Examination:   General appearance: alert, well appearing, and in no distress  Mental status: alert, oriented to person, place, and time, normal mood, behavior, speech, dress, motor activity, and thought  processes, affect appropriate to mood  Skin: warm & dry   Extremities: Edema: None    Cardiovascular: normal heart rate noted  Respiratory: normal respiratory effort, no distress  Abdomen: gravid, soft, non-tender  Pelvic: Cervical exam deferred         Fetal Status: Fetal Heart Rate (bpm): 146 Fundal Height: 33 cm Movement: Present    Fetal Surveillance Testing today: None  Results for orders placed or performed in visit on 05/31/19 (from the past 24 hour(s))  POC Urinalysis Dipstick OB   Collection Time: 05/31/19  1:55 PM  Result Value Ref Range   Color, UA     Clarity, UA     Glucose, UA Negative Negative   Bilirubin, UA     Ketones, UA neg    Spec Grav, UA     Blood, UA neg    pH, UA     POC,PROTEIN,UA Negative Negative, Trace, Small (1+), Moderate (2+), Large (3+), 4+   Urobilinogen, UA     Nitrite, UA neg    Leukocytes, UA Negative Negative   Appearance     Odor      Assessment & Plan:  1) High-risk pregnancy G2P1001 at [redacted]w[redacted]d with an Estimated Date of Delivery: 07/08/19   2) Hx vag bleeding in 3rd tri, stable  3) hx of GHTN, stable  4) Asthma, mildly controlled  Meds: No orders of the defined types were placed in this encounter.   Labs/procedures today:  1. Rx Tussionex 2. Chest x-ray  Treatment Plan:  Repeat c/s 07/01/2019  Reviewed: Preterm labor symptoms and general obstetric precautions  including but not limited to vaginal bleeding, contractions, leaking of fluid and fetal movement were reviewed in detail with the patient.  All questions were answered. Check bp weekly, let us know if >140/90.   Follow-up: Return in about 2 weeks (around 06/14/2019) for GBS/GCCHL.  Orders Placed This Encounter  Procedures  . POC Urinalysis Dipstick OB   By signing my name below, I, Samul Dada, attest that this documentation has been prepared under the direction and in the presence of Jonnie Kind, MD. Electronically Signed: [place].  05/31/19. 2:15 PM.  I personally performed the services described in this documentation, which was SCRIBED in my presence. The recorded information has been reviewed and considered accurate. It has been edited as necessary during review. Jonnie Kind, MD

## 2019-06-03 MED FILL — SYMBICORT 160-4.5 MCG INH: 160-4.5 | 30 days supply | Qty: 10 | Fill #3

## 2019-06-06 ENCOUNTER — Other Ambulatory Visit: Payer: Self-pay

## 2019-06-06 ENCOUNTER — Encounter: Payer: Self-pay | Admitting: Emergency Medicine

## 2019-06-06 ENCOUNTER — Ambulatory Visit (INDEPENDENT_AMBULATORY_CARE_PROVIDER_SITE_OTHER): Payer: 59 | Admitting: Emergency Medicine

## 2019-06-06 DIAGNOSIS — J4540 Moderate persistent asthma, uncomplicated: Secondary | ICD-10-CM | POA: Diagnosis not present

## 2019-06-06 MED ORDER — LEVOCETIRIZINE DIHYDROCHLORIDE 5 MG PO TABS: 5.0000 mg | ORAL_TABLET | Freq: Every evening | ORAL | 5 refills | Status: DC

## 2019-06-06 MED FILL — LEVOCETIRIZINE 5 MG TABLET: 5 | 30 days supply | Qty: 30 | Fill #0

## 2019-06-06 NOTE — Assessment & Plan Note (Signed)
A lot of crossover between her symptoms, cough is the predominant symptom and is certainly at least in part upper airway.  Albuterol does not always help but sometimes does which would indicate that she is also having true asthma symptoms.  Both are exacerbated by her pregnancy, her presumed reflux, allergic rhinitis.  She is in the third trimester.  Hopefully her symptoms have at least plateaued although our control is marginal.  I anticipate that she will continue to have daily symptoms until her delivery.  Please stop loratadine. Try starting xyzal once daily Please continue Singulair, Nexium as you are taking them Continue Symbicort 2 puffs twice daily.  Rinse and gargle after using. Keep your albuterol available to use up to every 4 hours if needed for shortness of breath, chest tightness, wheezing. Okay to continue cough suppression and as written by your obstetrician We will arrange for a follow-up visit, either in office or by phone in early November (before November 13) to assess status before your delivery date.

## 2019-06-06 NOTE — Progress Notes (Signed)
Subjective:    Patient ID: Tammy Chapman, female    DOB: [birth year], 28 y.o.   MRN: [id number]   HPI 28 year old never smoker, carries a history of childhood asthma, had grown out of it. She had RSV in 2018 aftter delivery of her son. Improved with steroids. Then last year she had cough, dyspnea, chest tightness, was dx with adult asthma >>  Pulmonary function testing and methacholine challenge 01/20/2018 reviewed, shows normal initial spirometry with hyperresponsiveness to methacholine at a dose of 0.25 consistent with asthma.  Symbicort and albuterol added.   She had another rough period in Nov, Dec. She is currently [redacted] weeks pregnant (beginning March).  Currently managed on Zyrtec, Nexium 20 mg prn, Symbicort, pro-air which she uses approximately 3-4x a day. Doesn't always help completely.   She has been dealing with more dyspnea and chest tightness. Minimal congestion these days. No overt GERD sx (yet; she had this with her first pregnancy)  She saw Dr. Drucilla Schmidt 6/3 and neutral oxidative assay was normal.  Immunoglobulin screen was all normal as well.  ESR 14.  CRP slightly elevated at 12.7  ROV 04/06/2019 --this is a follow-up visit for asthma as established by lung clinical history and also methacholine challenge in June 2019.  I met her in late June when she was [redacted] weeks pregnant.  At that time we added Singulair to her existing Symbicort.  Also changed Zyrtec to loratadine, added Nexium for presumed evolving GERD as well.  Today she reports that her cough was initially better on singulair. Over the last 2 weeks has had increased dyspnea, uses her albuterol about 2-5x a day, ran out it 2 weeks. 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 Experiencing restrictive disease as well as intermittent obstruction. She does hear wheeze with exertion, sometimes wakes her from sleep. Has not had any pred since last time.   ROV 06/06/2019 --  28 year old woman with a  history of moderate persistent asthma, chronic rhinitis and allergies, chronic cough.  She is currently pregnant, 36 weeks.  We have been managing her with Symbicort, albuterol as needed which she uses approximately.  Her potential exacerbate her's were treated with Singulair, loratadine, Nexium.  She reports today that she had preterm labor at 31 weeks, had ? Placental abruption. She had 8 days of bedrest, was able to go home. Planning for c-section mid-November. Her cough has increased - was treated empirically by Dr Luan Pulling + steroids late September.  Using symbicort, singulair. She has increased nasal congestion, sore throat in the am. She remains on the nexium. She is on light duty at work. She is now using tussionex per her OB.    Review of Systems  Constitutional: Negative for fever and unexpected weight change.  HENT: Negative for congestion, dental problem, ear pain, nosebleeds, postnasal drip, rhinorrhea, sinus pressure, sneezing, sore throat and trouble swallowing.   Eyes: Negative for redness and itching.  Respiratory: Positive for cough. Negative for chest tightness, shortness of breath and wheezing.   Cardiovascular: Negative for palpitations and leg swelling.  Gastrointestinal: Negative for nausea and vomiting.  Genitourinary: Negative for dysuria.  Musculoskeletal: Negative for joint swelling.  Skin: Negative for rash.  Neurological: Negative for headaches.  Hematological: Does not bruise/bleed easily.  Psychiatric/Behavioral: Negative for dysphoric mood. The patient is not nervous/anxious.     Past Medical History:  Diagnosis Date  . Asthma   . Bronchitis, mucopurulent recurrent ([place]) 01/19/2019  .  Chronic asthma 01/19/2019  . GERD (gastroesophageal reflux disease)   . Loose body of right knee 02/2013  . Mosquito bite 03/08/2013  . Recurrent otitis media of both ears 01/19/2019  . Sprain and strain of medial collateral ligament of knee 02/2013   right     Family History   Problem Relation Age of Onset  . Diabetes Mother   . Cancer Paternal Grandfather        renal  . Diabetes Maternal Grandfather   . COPD Maternal Grandfather   . Congestive Heart Failure Maternal Grandfather   . Eclampsia Sister   . Eclampsia Maternal Aunt   . Eclampsia Maternal Aunt   . Eclampsia Maternal Aunt      Social History   Socioeconomic History  . Marital status: Married    Spouse name: Not on file  . Number of children: 1  . Years of education: Not on file  . Highest education level: Not on file  Occupational History  . Not on file  Social Needs  . Financial resource strain: Not on file  . Food insecurity    Worry: Not on file    Inability: Not on file  . Transportation needs    Medical: Not on file    Non-medical: Not on file  Tobacco Use  . Smoking status: Never Smoker  . Smokeless tobacco: Never Used  Substance and Sexual Activity  . Alcohol use: No  . Drug use: No  . Sexual activity: Yes    Partners: Male    Birth control/protection: None  Lifestyle  . Physical activity    Days per week: Not on file    Minutes per session: Not on file  . Stress: Not on file  Relationships  . Social Herbalist on phone: Not on file    Gets together: Not on file    Attends religious service: Not on file    Active member of club or organization: Not on file    Attends meetings of clubs or organizations: Not on file    Relationship status: Not on file  . Intimate partner violence    Fear of current or ex partner: Not on file    Emotionally abused: Not on file    Physically abused: Not on file    Forced sexual activity: Not on file  Other Topics Concern  . Not on file  Social History Narrative  . Not on file     Allergies  Allergen Reactions  . Benadryl [Diphenhydramine Hcl] Shortness Of Breath    Pt states that she feels fine if she sits down, but if she is moving around after taking benadryl, she feels SOB  . Penicillins Shortness Of Breath     Has patient had a PCN reaction causing immediate rash, facial/tongue/throat swelling, SOB or lightheadedness with hypotension: yes Has patient had a PCN reaction causing severe rash involving mucus membranes or skin necrosis: no Has patient had a PCN reaction that required hospitalization: no Has patient had a PCN reaction occurring within the last 10 years: no If all of the above answers are "NO", then may proceed with Cephalosporin use.   . Adhesive [Tape] Hives  . Percocet [Oxycodone-Acetaminophen]   . Lactose Intolerance (Gi) Diarrhea and Nausea And Vomiting  . Latex Rash  . Sulfa Antibiotics Rash  . Ultram [Tramadol Hcl] Rash     Outpatient Medications Prior to Visit  Medication Sig Dispense Refill  . acetaminophen (TYLENOL) 325 MG tablet Take  650 mg by mouth every 6 (six) hours as needed for moderate pain.    Marland Kitchen aspirin 81 MG chewable tablet Chew 162 mg by mouth daily.    . chlorpheniramine-HYDROcodone (TUSSIONEX PENNKINETIC ER) 10-8 MG/5ML SUER Take 5 mLs by mouth every 12 (twelve) hours as needed for cough. 140 mL 0  . esomeprazole (NEXIUM) 20 MG capsule Take 20 mg by mouth daily as needed (heartburn).    . loratadine (CLARITIN) 10 MG tablet Take 10 mg by mouth daily.    . magic mouthwash SOLN Take 5 mLs by mouth 4 (four) times daily as needed for mouth pain. 120 mL 2  . montelukast (SINGULAIR) 10 MG tablet Take 1 tablet (10 mg total) by mouth at bedtime. 30 tablet 11  . prenatal vitamin w/FE, FA (PRENATAL 1 + 1) 27-1 MG TABS tablet Take 1 tablet by mouth daily at 12 noon. 30 each 12  . PROAIR HFA 108 (90 Base) MCG/ACT inhaler Inhale 2 puffs into the lungs every 4 (four) hours as needed for wheezing or shortness of breath. 54 g 1  . SYMBICORT 160-4.5 MCG/ACT inhaler Inhale 2 puffs into the lungs 2 (two) times daily.   12   Facility-Administered Medications Prior to Visit  Medication Dose Route Frequency Provider Last Rate Last Dose  . chlorpheniramine-HYDROcodone (TUSSIONEX) 10-8  MG/5ML suspension 5 mL  5 mL Oral Q12H Jonnie Kind, MD            Objective:   Physical Exam Today's Vitals   06/06/19 0911  BP: 122/84  Pulse: (!) 115  SpO2: 98%  Weight: 222 lb (100.7 kg)  Height: 5' 10"  (1.778 m)   Body mass index is 31.85 kg/m.   Gen: Pleasant, well-nourished, in no distress,  normal affect  ENT: No lesions,  mouth clear,  oropharynx clear, no postnasal drip  Neck: No JVD, no UA noise  Lungs: No use of accessory muscles, no crackles or wheezing on normal respiration, no wheeze on forced expiration  Cardiovascular: RRR, heart sounds normal, no murmur or gallops, no peripheral edema  Musculoskeletal: No deformities, no cyanosis or clubbing  Neuro: alert, awake, non focal  Skin: Warm, no lesions or rash     Assessment & Plan:  Chronic asthma A lot of crossover between her symptoms, cough is the predominant symptom and is certainly at least in part upper airway.  Albuterol does not always help but sometimes does which would indicate that she is also having true asthma symptoms.  Both are exacerbated by her pregnancy, her presumed reflux, allergic rhinitis.  She is in the third trimester.  Hopefully her symptoms have at least plateaued although our control is marginal.  I anticipate that she will continue to have daily symptoms until her delivery.  Please stop loratadine. Try starting xyzal once daily Please continue Singulair, Nexium as you are taking them Continue Symbicort 2 puffs twice daily.  Rinse and gargle after using. Keep your albuterol available to use up to every 4 hours if needed for shortness of breath, chest tightness, wheezing. Okay to continue cough suppression and as written by your obstetrician We will arrange for a follow-up visit, either in office or by phone in early November (before November 13) to assess status before your delivery date.   Baltazar Apo, MD, PhD 06/06/2019, 9:41 AM [facility]  [phone] or if no answer [phone]

## 2019-06-06 NOTE — Patient Instructions (Addendum)
Please stop loratadine. Try starting xyzal once daily Please continue Singulair, Nexium as you are taking them Continue Symbicort 2 puffs twice daily.  Rinse and gargle after using. Keep your albuterol available to use up to every 4 hours if needed for shortness of breath, chest tightness, wheezing. Okay to continue cough suppression and as written by your obstetrician We will arrange for a follow-up visit, either in office or by phone in early November (before November 13) to assess status before your delivery date.

## 2019-06-14 ENCOUNTER — Ambulatory Visit (INDEPENDENT_AMBULATORY_CARE_PROVIDER_SITE_OTHER): Payer: 59 | Admitting: Obstetrics and Gynecology

## 2019-06-14 ENCOUNTER — Other Ambulatory Visit: Payer: Self-pay | Admitting: Obstetrics and Gynecology

## 2019-06-14 ENCOUNTER — Encounter: Payer: Self-pay | Admitting: Obstetrics and Gynecology

## 2019-06-14 ENCOUNTER — Other Ambulatory Visit: Payer: Self-pay

## 2019-06-14 VITALS — BP 122/76 | HR 92 | Wt 223.8 lb

## 2019-06-14 DIAGNOSIS — Z3A36 36 weeks gestation of pregnancy: Secondary | ICD-10-CM

## 2019-06-14 DIAGNOSIS — Z113 Encounter for screening for infections with a predominantly sexual mode of transmission: Secondary | ICD-10-CM

## 2019-06-14 DIAGNOSIS — Z1389 Encounter for screening for other disorder: Secondary | ICD-10-CM

## 2019-06-14 DIAGNOSIS — Z3483 Encounter for supervision of other normal pregnancy, third trimester: Secondary | ICD-10-CM

## 2019-06-14 DIAGNOSIS — Z331 Pregnant state, incidental: Secondary | ICD-10-CM

## 2019-06-14 LAB — POCT URINALYSIS DIPSTICK OB
Blood, UA: NEGATIVE
Glucose, UA: NEGATIVE
Ketones, UA: NEGATIVE
Leukocytes, UA: NEGATIVE
Nitrite, UA: NEGATIVE
POC,PROTEIN,UA: NEGATIVE

## 2019-06-14 MED ORDER — BLOOD PRESSURE CUFF MISC: 1.0000 | 0 refills | Status: DC

## 2019-06-14 NOTE — Progress Notes (Signed)
   LOW-RISK PREGNANCY VISIT Patient name: Tammy Chapman MRN [id number]  Date of birth: 1990-09-21 Chief Complaint:   Routine Prenatal Visit (GBS/GC/CHL)  History of Present Illness:   Tammy Chapman is a 28 y.o. G18P1001 female at [redacted]w[redacted]d with an Estimated Date of Delivery: 07/08/19 being seen today for ongoing management of a low-risk pregnancy.  She is a prior cesarean scheduled for repeat cesarean section 10 AM 13 November Today she reports no complaints and Some suprapubic pressure only. Contractions: Irregular. Vag. Bleeding: None.  Movement: Present. denies leaking of fluid. Review of Systems:   Pertinent items are noted in HPI Denies abnormal vaginal discharge w/ itching/odor/irritation, headaches, visual changes, shortness of breath, chest pain, abdominal pain, severe nausea/vomiting, or problems with urination or bowel movements unless otherwise stated above. Pertinent History Reviewed:  Reviewed past medical,surgical, social, obstetrical and family history.  Reviewed problem list, medications and allergies. Physical Assessment:   Vitals:   06/14/19 1417  BP: 122/76  Pulse: 92  Weight: 223 lb 12.8 oz (101.5 kg)  Body mass index is 32.11 kg/m.        Physical Examination:   General appearance: Well appearing, and in no distress  Mental status: Alert, oriented to person, place, and time  Skin: Warm & dry  Cardiovascular: Normal heart rate noted  Respiratory: Normal respiratory effort, no distress  Abdomen: Soft, gravid, nontender fundal height 36 cm fetal heart rate [id number] cephalic  Pelvic: Cervical exam performed long closed posterior vertex        Extremities: Edema: None  Fetal Status:     Movement: Present    Chaperone: Peggy Dones    Results for orders placed or performed in visit on 06/14/19 (from the past 24 hour(s))  POC Urinalysis Dipstick OB   Collection Time: 06/14/19  2:16 PM  Result Value Ref Range   Color, UA     Clarity, UA     Glucose, UA Negative Negative    Bilirubin, UA     Ketones, UA neg    Spec Grav, UA     Blood, UA neg    pH, UA     POC,PROTEIN,UA Negative Negative, Trace, Small (1+), Moderate (2+), Large (3+), 4+   Urobilinogen, UA     Nitrite, UA neg    Leukocytes, UA Negative Negative   Appearance     Odor      Assessment & Plan:  1) Low-risk pregnancy G2P1001 at [redacted]w[redacted]d with an Estimated Date of Delivery: 07/08/19   2) previous cesarean section for repeat cesarean section 13 November, needs blood pressure cuff   Meds: No orders of the defined types were placed in this encounter.  Labs/procedures today: Urinalysis negative  Plan:  Continue routine obstetrical care telemetry visit 1 week and office visit 2 weeks with JV F, repeat cesarean section 13 November Next visit: prefers online will order blood pressure cuff today  Reviewed: Term labor symptoms and general obstetric precautions including but not limited to vaginal bleeding, contractions, leaking of fluid and fetal movement were reviewed in detail with the patient.  All questions were answered.  Ordered home bp cuff. Rx faxed to: Apothecary. Check bp weekly, let us know if >140/90.   Follow-up: No follow-ups on file.  Orders Placed This Encounter  Procedures  . Strep Gp B NAA+Rflx  . GC/Chlamydia Probe Amp  . POC Urinalysis Dipstick OB   Jonnie Kind CNM, [facility] 06/14/2019 2:59 PM

## 2019-06-15 ENCOUNTER — Telehealth (HOSPITAL_COMMUNITY): Payer: Self-pay | Admitting: *Deleted

## 2019-06-15 ENCOUNTER — Telehealth: Payer: Self-pay | Admitting: *Deleted

## 2019-06-15 DIAGNOSIS — Z113 Encounter for screening for infections with a predominantly sexual mode of transmission: Secondary | ICD-10-CM | POA: Diagnosis not present

## 2019-06-15 DIAGNOSIS — Z3A36 36 weeks gestation of pregnancy: Secondary | ICD-10-CM | POA: Diagnosis not present

## 2019-06-15 DIAGNOSIS — Z3483 Encounter for supervision of other normal pregnancy, third trimester: Secondary | ICD-10-CM | POA: Diagnosis not present

## 2019-06-15 NOTE — Telephone Encounter (Signed)
Preadmission screen  

## 2019-06-15 NOTE — Telephone Encounter (Signed)
Patient states she notices a couple of drops of blood this morning when using the bathroom and has had bleeding in the past but isn't sure if it could have been cause by exam yesterday. Would like a call back.

## 2019-06-15 NOTE — Telephone Encounter (Signed)
Patient states she noticed some light spotting this morning only when she wiped after using the bathroom and is also having menstrual like cramps.  Is not having to wear a pad due to bleeding. Baby is active. She is currently at work at [employer].  Denies recent intercourse.  Informed patent that spotting could be from the exam and swabs from yesterday.  Advised to continue to monitor and if bleeding increased, cramping became more frequent/intense, to go to [facility].  Pt verbalized understanding and agreeable to plan.

## 2019-06-16 ENCOUNTER — Encounter (HOSPITAL_COMMUNITY): Payer: Self-pay

## 2019-06-16 NOTE — Patient Instructions (Addendum)
Tammy Chapman  06/16/2019   Your procedure is scheduled on:  07/01/2019  Arrive at 0800 at Entrance C on [address] at [facility]  and [address]. You are invited to use the FREE valet parking or use the Visitor's parking deck.  Pick up the phone at the desk and dial [phone].  Call this number if you have problems the morning of surgery: [phone]  Remember:   Do not eat food:(After Midnight) Desps de medianoche.  Do not drink clear liquids: (After Midnight) Desps de medianoche.  Take these medicines the morning of surgery with A SIP OF WATER:  symbicort   Do not wear jewelry, make-up or nail polish.  Do not wear lotions, powders, or perfumes. Do not wear deodorant.  Do not shave 48 hours prior to surgery.  Do not bring valuables to the hospital.  [facility] is not   responsible for any belongings or valuables brought to the hospital.  Contacts, dentures or bridgework may not be worn into surgery.  Leave suitcase in the car. After surgery it may be brought to your room.  For patients admitted to the hospital, checkout time is 11:00 AM the day of              discharge.      Please read over the following fact sheets that you were given:     Preparing for Surgery

## 2019-06-17 LAB — SPECIMEN STATUS REPORT

## 2019-06-17 LAB — STREP GP B NAA+RFLX: Strep Gp B NAA+Rflx: NEGATIVE

## 2019-06-18 ENCOUNTER — Encounter: Payer: Self-pay | Admitting: Obstetrics and Gynecology

## 2019-06-18 LAB — SPECIMEN STATUS REPORT

## 2019-06-18 LAB — GC/CHLAMYDIA PROBE AMP
Chlamydia trachomatis, NAA: NEGATIVE
Neisseria Gonorrhoeae by PCR: NEGATIVE

## 2019-06-20 ENCOUNTER — Other Ambulatory Visit: Payer: Self-pay

## 2019-06-20 ENCOUNTER — Telehealth (INDEPENDENT_AMBULATORY_CARE_PROVIDER_SITE_OTHER): Payer: 59 | Admitting: Advanced Practice Midwife

## 2019-06-20 VITALS — BP 120/70 | HR 90

## 2019-06-20 DIAGNOSIS — Z3A37 37 weeks gestation of pregnancy: Secondary | ICD-10-CM

## 2019-06-20 DIAGNOSIS — Z3482 Encounter for supervision of other normal pregnancy, second trimester: Secondary | ICD-10-CM

## 2019-06-20 MED ORDER — ONDANSETRON 4 MG PO TBDP: 4.0000 mg | ORAL_TABLET | Freq: Four times a day (QID) | ORAL | 2 refills | Status: DC | PRN

## 2019-06-20 MED ORDER — NEOMYCIN-POLYMYXIN-HC 3.5-10000-1 OT SOLN: 3.0000 [drp] | Freq: Four times a day (QID) | OTIC | 0 refills | Status: DC

## 2019-06-20 MED ORDER — CEPHALEXIN 500 MG PO CAPS: 500.0000 mg | ORAL_CAPSULE | Freq: Three times a day (TID) | ORAL | 0 refills | Status: DC

## 2019-06-20 MED FILL — MONTELUKAST SOD 10 MG TAB: 10 | 30 days supply | Qty: 30 | Fill #3

## 2019-06-20 NOTE — Progress Notes (Signed)
   TELEHEALTH VIRTUAL OBSTETRICS VISIT ENCOUNTER NOTE  I connected with CHELSI SHIPLEY on 06/20/19 at  9:10 AM EST by telephone at home and verified that I am speaking with the correct person using two identifiers.   I discussed the limitations, risks, security and privacy concerns of performing an evaluation and management service by telephone and the availability of in person appointments. I also discussed with the patient that there may be a patient responsible charge related to this service. The patient expressed understanding and agreed to proceed.  Subjective:  Tammy Chapman is a 28 y.o. G2P1001 at [redacted]w[redacted]d being followed for ongoing prenatal care.  She is currently monitored for the following issues for this low-risk pregnancy and has GERD (gastroesophageal reflux disease); Abnormality of gait; Supervision of normal pregnancy; History of gestational hypertension; Previous cesarean section; Chronic asthma; Bronchitis, mucopurulent recurrent ([place]); Recurrent otitis media of both ears; and Vaginal bleeding in pregnancy, third trimester on their problem list.  Patient reports  Left ear painful this am w/drainage. Frequent ear infections, usually takes oral/otic abx.  Had ctx q 20 minutes yesterday, felt nauseated all day. Nothing close/regular. Reports fetal movement. Denies any contractions, bleeding or leaking of fluid.   The following portions of the patient's history were reviewed and updated as appropriate: allergies, current medications, past family history, past medical history, past social history, past surgical history and problem list.   Objective:   General:  Alert, oriented and cooperative.   Mental Status: Normal mood and affect perceived. Normal judgment and thought content.  Rest of physical exam deferred due to type of encounter  Assessment and Plan:  Pregnancy: G2P1001 at [redacted]w[redacted]d  Take BP daily, let us know if >140/90  Rx keflex, cortosporin drops and zofran  Term labor  symptoms and general obstetric precautions including but not limited to vaginal bleeding, contractions, leaking of fluid and fetal movement were reviewed in detail with the patient.  I discussed the assessment and treatment plan with the patient. The patient was provided an opportunity to ask questions and all were answered. The patient agreed with the plan and demonstrated an understanding of the instructions. The patient was advised to call back or seek an in-person office evaluation/go to MAU at [facility] for any urgent or concerning symptoms. Please refer to After Visit Summary for other counseling recommendations.   I provided 15 minutes of non-face-to-face time during this encounter.  Return in about 1 week (around 06/27/2019) for [place].  Future Appointments  Date Time Provider [place]  06/24/2019  9:30 AM Lauraine Rinne, NP [facility] None  06/27/2019  8:30 AM Jonnie Kind, MD [facility]  06/29/2019  8:30 AM [facility] 1 [facility] None    Christin Fudge, [place] for [employer], [place]

## 2019-06-23 NOTE — Progress Notes (Signed)
Virtual Visit via Telephone Note  I connected with Tammy Chapman on 06/24/19 at  9:30 AM EST by telephone and verified that I am speaking with the correct person using two identifiers.  Location: Patient: Home Provider: Office [facility]wife [facility] - [id number], [place], [place], [place] [address]   I discussed the limitations, risks, security and privacy concerns of performing an evaluation and management service by telephone and the availability of in person appointments. I also discussed with the patient that there may be a patient responsible charge related to this service. The patient expressed understanding and agreed to proceed.  Patient consented to consult via telephone: Yes People present and their role in pt care: Pt    History of Present Illness:  28 year old never smoker followed in our office for asthma   Past medical history: GERD, cough, recurrent ear infections, adult eustachian tubes Smoking history: Never smoker Maintenance: Symbicort 160 Patient of Dr. Lamonte Sakai  Chief complaint: Asthma follow   28 year old female never smoker completing a televisit with our office today.  Patient reports she has been doing well since last seen Dr. Lamonte Sakai on 06/06/2019.  She has not needed any additional steroids since then.  Unfortunately the patient is dealing with an ear infection at this time which is being treated by her OB/GYN.  She started antibiotic eardrops on 06/20/2019.  Patient had a stable virtual evaluation by her OB GYN/certified nurse midwife on 06/20/2019.  Patient plans to have a scheduled C-section on 07/01/2019.  Patient attributes her recent stability with her breathing to starting Xyzal since last office visit.  She continues to take Singulair as well as her Symbicort 160.  Using her rescue inhaler about 1 time a day sometimes more often.  Patient feels that she is at a stable interval.  Observations/Objective:  Calm demeanor over the telephone No audible  wheezing No cough  06/01/2019-chest x-ray-no active cardiopulmonary disease  01/20/2018-pulmonary function tests-FVC 4.68 (102% predicted), postbronchodilator ratio 86, postbronchodilator FEV1 4.06 (105% predicted), no bronchodilator response   Assessment and Plan:  Recurrent otitis media of both ears Plan: Finish antibiotics as ordered by OB/GYN  GERD (gastroesophageal reflux disease) Plan: Continue Nexium  Chronic asthma Plan: Continue Singulair Continue Symbicort 160 Continue Nexium Continue Xyzal Continue rescue inhaler  Follow-up in 3 months   Follow Up Instructions:  Return in about 3 months (around 09/24/2019), or if symptoms worsen or fail to improve, for Follow up with Dr. Lamonte Sakai.   I discussed the assessment and treatment plan with the patient. The patient was provided an opportunity to ask questions and all were answered. The patient agreed with the plan and demonstrated an understanding of the instructions.   The patient was advised to call back or seek an in-person evaluation if the symptoms worsen or if the condition fails to improve as anticipated.  I provided 28 minutes of non-face-to-face time during this encounter.   Lauraine Rinne, NP

## 2019-06-24 ENCOUNTER — Encounter: Payer: Self-pay | Admitting: Pulmonary Disease

## 2019-06-24 ENCOUNTER — Ambulatory Visit (INDEPENDENT_AMBULATORY_CARE_PROVIDER_SITE_OTHER): Payer: 59 | Admitting: Pulmonary Disease

## 2019-06-24 ENCOUNTER — Other Ambulatory Visit: Payer: Self-pay

## 2019-06-24 DIAGNOSIS — J4540 Moderate persistent asthma, uncomplicated: Secondary | ICD-10-CM | POA: Diagnosis not present

## 2019-06-24 DIAGNOSIS — H6506 Acute serous otitis media, recurrent, bilateral: Secondary | ICD-10-CM | POA: Diagnosis not present

## 2019-06-24 DIAGNOSIS — K219 Gastro-esophageal reflux disease without esophagitis: Secondary | ICD-10-CM | POA: Diagnosis not present

## 2019-06-24 NOTE — Assessment & Plan Note (Signed)
Plan: Continue Singulair Continue Symbicort 160 Continue Nexium Continue Xyzal Continue rescue inhaler  Follow-up in 3 months

## 2019-06-24 NOTE — Assessment & Plan Note (Signed)
Plan: Finish antibiotics as ordered by OB/GYN

## 2019-06-24 NOTE — Patient Instructions (Signed)
You were seen today by Lauraine Rinne, NP  for:   1. Recurrent acute serous otitis media of both ears  Finish antibiotics as ordered by OB/GYN  2. Gastroesophageal reflux disease, unspecified whether esophagitis present  Continue Nexium  >>>Please take 1 tablet daily 15 minutes to 30 minutes before your first meal of the day as well as before your other medications >>>Try to take at the same time each day >>>take this medication daily  GERD management: >>>Avoid laying flat until 2 hours after meals >>>Elevate head of the bed including entire chest >>>Reduce size of meals and amount of fat, acid, spices, caffeine and sweets >>>If you are smoking, Please stop! >>>Decrease alcohol consumption >>>Work on maintaining a healthy weight with normal BMI   3. Moderate persistent chronic asthma without complication  Continue Symbicort 160 >>> 2 puffs in the morning right when you wake up, rinse out your mouth after use, 12 hours later 2 puffs, rinse after use >>> Take this daily, no matter what >>> This is not a rescue inhaler   Only use your albuterol as a rescue medication to be used if you can't catch your breath by resting or doing a relaxed purse lip breathing pattern.  - The less you use it, the better it will work when you need it. - Ok to use up to 2 puffs  every 4 hours if you must but call for immediate appointment if use goes up over your usual need - Don't leave home without it !!  (think of it like the spare tire for your car)   Continue Singulair  Continue Xyzal   Follow Up:    Return in about 3 months (around 09/24/2019), or if symptoms worsen or fail to improve, for Follow up with Dr. Lamonte Sakai.   Please do your part to reduce the spread of COVID-19:      Reduce your risk of any infection  and COVID19 by using the similar precautions used for avoiding the common cold or flu:  Marland Kitchen Wash your hands often with soap and warm water for at least 20 seconds.  If soap and water  are not readily available, use an alcohol-based hand sanitizer with at least 60% alcohol.  . If coughing or sneezing, cover your mouth and nose by coughing or sneezing into the elbow areas of your shirt or coat, into a tissue or into your sleeve (not your hands). Langley Gauss A MASK when in public  . Avoid shaking hands with others and consider head nods or verbal greetings only. . Avoid touching your eyes, nose, or mouth with unwashed hands.  . Avoid close contact with people who are sick. . Avoid places or events with large numbers of people in one location, like concerts or sporting events. . If you have some symptoms but not all symptoms, continue to monitor at home and seek medical attention if your symptoms worsen. . If you are having a medical emergency, call 911.   [place] / e-Visit: [email or web address]         [facility]: [facility]: [id number]                   [facility]: [id number]     It is flu season:   >>> Best ways to protect herself from the flu: Receive the yearly flu vaccine, practice good hand hygiene washing with soap and also using  hand sanitizer when available, eat a nutritious meals, get adequate rest, hydrate appropriately   Please contact the office if your symptoms worsen or you have concerns that you are not improving.   Thank you for choosing  [facility] for your healthcare, and for allowing [place] to partner with you on your healthcare journey. I am thankful to be able to provide care to you today.   Wyn Quaker FNP-C

## 2019-06-24 NOTE — Assessment & Plan Note (Signed)
Plan: Continue Nexium 

## 2019-06-25 ENCOUNTER — Telehealth: Payer: 59 | Admitting: Family

## 2019-06-25 DIAGNOSIS — H00011 Hordeolum externum right upper eyelid: Secondary | ICD-10-CM

## 2019-06-25 MED ORDER — NEOMYCIN-POLYMYXIN-DEXAMETH 3.5-10000-0.1 OP SUSP: 2.0000 [drp] | Freq: Four times a day (QID) | OPHTHALMIC | 0 refills | Status: DC

## 2019-06-25 NOTE — Progress Notes (Signed)
We are sorry that you are not feeling well. Here is how we plan to help!  Based on what you have shared with me it looks like you have a stye.  A stye is an inflammation of the eyelid.  It is often a red, painful lump near the edge of the eyelid that may look like a boil or a pimple.  A stye develops when an infection occurs at the base of an eyelash.   We have made appropriate suggestions for you based upon your presentation: Your symptoms may indicate an infection of the sclera.  The use of anti-inflammatory and antibiotic eye drops for a week will help resolve this condition.  I have sent in neomycin-polymyxin HC opthalmic suspension, two to three drops in the affected eye every 4 hours.  If your symptoms do not improve over the next two to three days you should be seen in your doctor's office.  HOME CARE:   Wash your hands often!  Let the stye open on its own. Don't squeeze or open it.  Don't rub your eyes. This can irritate your eyes and let in bacteria.  If you need to touch your eyes, wash your hands first.  Don't wear eye makeup or contact lenses until the area has healed.  GET HELP RIGHT AWAY IF:   Your symptoms do not improve.  You develop blurred or loss of vision.  Your symptoms worsen (increased discharge, pain or redness).  Thank you for choosing an e-visit.  Your e-visit answers were reviewed by a board certified advanced clinical practitioner to complete your personal care plan.  Depending upon the condition, your plan could have included both over the counter or prescription medications.  Please review your pharmacy choice.  Make sure the pharmacy is open so you can pick up prescription now.  If there is a problem, you may contact your provider through MyChart messaging and have the prescription routed to another pharmacy.    Your safety is important to us.  If you have drug allergies check your prescription carefully.  For the next 24 hours you can use MyChart to  ask questions about today's visit, request a non-urgent call back, or ask for a work or school excuse.  You will get an email in the next two days asking about your experience.  I hope you that your e-visit has been valuable and will speed your recovery.    Greater than 5 minutes, yet less than 10 minutes of time have been spent researching, coordinating, and implementing care for this patient today.  Thank you for the details you included in the comment boxes. Those details are very helpful in determining the best course of treatment for you and help us to provide the best care.  

## 2019-06-27 ENCOUNTER — Other Ambulatory Visit: Payer: Self-pay

## 2019-06-27 ENCOUNTER — Ambulatory Visit (INDEPENDENT_AMBULATORY_CARE_PROVIDER_SITE_OTHER): Payer: 59 | Admitting: Obstetrics and Gynecology

## 2019-06-27 ENCOUNTER — Other Ambulatory Visit: Payer: Self-pay | Admitting: Obstetrics and Gynecology

## 2019-06-27 VITALS — BP 114/74 | HR 97 | Wt 221.6 lb

## 2019-06-27 DIAGNOSIS — Z3483 Encounter for supervision of other normal pregnancy, third trimester: Secondary | ICD-10-CM

## 2019-06-27 DIAGNOSIS — Z1389 Encounter for screening for other disorder: Secondary | ICD-10-CM

## 2019-06-27 DIAGNOSIS — Z3A38 38 weeks gestation of pregnancy: Secondary | ICD-10-CM

## 2019-06-27 DIAGNOSIS — Z331 Pregnant state, incidental: Secondary | ICD-10-CM

## 2019-06-27 LAB — POCT URINALYSIS DIPSTICK OB
Blood, UA: NEGATIVE
Glucose, UA: NEGATIVE
Ketones, UA: NEGATIVE
Leukocytes, UA: NEGATIVE
Nitrite, UA: NEGATIVE
POC,PROTEIN,UA: NEGATIVE

## 2019-06-27 NOTE — Patient Instructions (Signed)

## 2019-06-27 NOTE — Progress Notes (Signed)
   LOW-RISK PREGNANCY VISIT Patient name: Tammy Chapman MRN [id number]  Date of birth: October 27, 1990 Chief Complaint:   Routine Prenatal Visit  History of Present Illness:   Tammy Chapman is a 28 y.o. G31P1001 female at [redacted]w[redacted]d with an Estimated Date of Delivery: 07/08/19 being seen today for ongoing management of a low-risk pregnancy, scheduled for Repeat cesarean on this Friday. Today she reports no complaints. Contractions: Irritability. Vag. Bleeding: None.  Movement: Present. denies leaking of fluid. Review of Systems:   Pertinent items are noted in HPI Denies abnormal vaginal discharge w/ itching/odor/irritation, headaches, visual changes, shortness of breath, chest pain, abdominal pain, severe nausea/vomiting, or problems with urination or bowel movements unless otherwise stated above. Pertinent History Reviewed:  Reviewed past medical,surgical, social, obstetrical and family history.  Reviewed problem list, medications and allergies. Physical Assessment:   Vitals:   06/27/19 0831  BP: 114/74  Pulse: 97  Weight: 221 lb 9.6 oz (100.5 kg)  Body mass index is 31.8 kg/m.        Physical Examination:   General appearance: Well appearing, and in no distress  Mental status: Alert, oriented to person, place, and time  Skin: Warm & dry  Cardiovascular: Normal heart rate noted  Respiratory: Normal respiratory effort, no distress  Abdomen: Soft, gravid, nontender 36 cm  131 fhr  Pelvic: Cervical exam deferred         Extremities: Edema: None  Fetal Status:     Movement: Present  131 FHR FH 36cm  Chaperone: n/a    Results for orders placed or performed in visit on 06/27/19 (from the past 24 hour(s))  POC Urinalysis Dipstick OB   Collection Time: 06/27/19  8:33 AM  Result Value Ref Range   Color, UA     Clarity, UA     Glucose, UA Negative Negative   Bilirubin, UA     Ketones, UA neg    Spec Grav, UA     Blood, UA neg    pH, UA     POC,PROTEIN,UA Negative Negative, Trace, Small  (1+), Moderate (2+), Large (3+), 4+   Urobilinogen, UA     Nitrite, UA neg    Leukocytes, UA Negative Negative   Appearance     Odor      Assessment & Plan:  1) Low-risk pregnancy G2P1001 at [redacted]w[redacted]d with an Estimated Date of Delivery: 07/08/19.  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  Labs/procedures today: None  Plan:  Continue routine obstetrical care scheduled for repeat cesarean this Friday Next visit: prefers Postop visit 10 days from sleeping    Reviewed:  labor symptoms and general obstetric precautions including but not limited to vaginal bleeding, contractions, leaking of fluid and fetal movement were reviewed in detail with the patient.  All questions were answered. . Check bp weekly or more often, let us know if >140/90.   Follow-up: No follow-ups on file.  Orders Placed This Encounter  Procedures  . POC Urinalysis Dipstick OB   Jonnie Kind CNM, [facility] 06/27/2019 8:45 AM

## 2019-06-29 ENCOUNTER — Other Ambulatory Visit (HOSPITAL_COMMUNITY)
Admission: RE | Admit: 2019-06-29 | Discharge: 2019-06-29 | Disposition: A | Discharge status: Home / Self Care | Payer: 59 | Source: Ambulatory Visit | Attending: Obstetrics and Gynecology | Admitting: Obstetrics and Gynecology

## 2019-06-29 ENCOUNTER — Other Ambulatory Visit: Payer: Self-pay

## 2019-06-29 DIAGNOSIS — O9902 Anemia complicating childbirth: Secondary | ICD-10-CM | POA: Diagnosis not present

## 2019-06-29 DIAGNOSIS — Z23 Encounter for immunization: Secondary | ICD-10-CM | POA: Diagnosis not present

## 2019-06-29 DIAGNOSIS — J45901 Unspecified asthma with (acute) exacerbation: Secondary | ICD-10-CM | POA: Diagnosis not present

## 2019-06-29 DIAGNOSIS — E669 Obesity, unspecified: Secondary | ICD-10-CM | POA: Diagnosis not present

## 2019-06-29 DIAGNOSIS — O9952 Diseases of the respiratory system complicating childbirth: Secondary | ICD-10-CM | POA: Diagnosis not present

## 2019-06-29 DIAGNOSIS — Z01812 Encounter for preprocedural laboratory examination: Secondary | ICD-10-CM | POA: Insufficient documentation

## 2019-06-29 DIAGNOSIS — D649 Anemia, unspecified: Secondary | ICD-10-CM | POA: Diagnosis not present

## 2019-06-29 DIAGNOSIS — O99214 Obesity complicating childbirth: Secondary | ICD-10-CM | POA: Diagnosis not present

## 2019-06-29 DIAGNOSIS — Z20828 Contact with and (suspected) exposure to other viral communicable diseases: Secondary | ICD-10-CM | POA: Diagnosis not present

## 2019-06-29 DIAGNOSIS — O34211 Maternal care for low transverse scar from previous cesarean delivery: Secondary | ICD-10-CM | POA: Diagnosis not present

## 2019-06-29 HISTORY — DX: Gestational (pregnancy-induced) hypertension without significant proteinuria, unspecified trimester: O13.9

## 2019-06-29 LAB — CBC
HCT: 34.8 % — ABNORMAL LOW (ref 36.0–46.0)
Hemoglobin: 11.1 g/dL — ABNORMAL LOW (ref 12.0–15.0)
MCH: 24.3 pg — ABNORMAL LOW (ref 26.0–34.0)
MCHC: 31.9 g/dL (ref 30.0–36.0)
MCV: 76.1 fL — ABNORMAL LOW (ref 80.0–100.0)
Platelets: 157 10*3/uL (ref 150–400)
RBC: 4.57 MIL/uL (ref 3.87–5.11)
RDW: 14.1 % (ref 11.5–15.5)
WBC: 7.5 10*3/uL (ref 4.0–10.5)
nRBC: 0 % (ref 0.0–0.2)

## 2019-06-29 LAB — TYPE AND SCREEN
ABO/RH(D): A POS
Antibody Screen: NEGATIVE

## 2019-06-29 LAB — RPR: RPR Ser Ql: NONREACTIVE

## 2019-06-29 LAB — SARS CORONAVIRUS 2 (TAT 6-24 HRS): SARS Coronavirus 2: NEGATIVE

## 2019-06-29 NOTE — MAU Note (Signed)
Covid swab performed. Pt asymptomatic.

## 2019-07-01 ENCOUNTER — Encounter (HOSPITAL_COMMUNITY): Payer: Self-pay | Admitting: *Deleted

## 2019-07-01 ENCOUNTER — Inpatient Hospital Stay (HOSPITAL_COMMUNITY): Payer: 59 | Admitting: Anesthesiology

## 2019-07-01 ENCOUNTER — Encounter (HOSPITAL_COMMUNITY)
Admission: RE | Disposition: A | Discharge status: Home / Self Care | Payer: Self-pay | Source: Home / Self Care | Attending: Obstetrics and Gynecology

## 2019-07-01 ENCOUNTER — Inpatient Hospital Stay (HOSPITAL_COMMUNITY)
Admission: RE | Admit: 2019-07-01 | Discharge: 2019-07-03 | DRG: 787 | Disposition: A | Discharge status: Home / Self Care | Payer: 59 | Attending: Obstetrics and Gynecology | Admitting: Obstetrics and Gynecology

## 2019-07-01 ENCOUNTER — Other Ambulatory Visit: Payer: Self-pay

## 2019-07-01 DIAGNOSIS — J45901 Unspecified asthma with (acute) exacerbation: Secondary | ICD-10-CM | POA: Diagnosis present

## 2019-07-01 DIAGNOSIS — Z20828 Contact with and (suspected) exposure to other viral communicable diseases: Secondary | ICD-10-CM | POA: Diagnosis present

## 2019-07-01 DIAGNOSIS — D649 Anemia, unspecified: Secondary | ICD-10-CM | POA: Diagnosis present

## 2019-07-01 DIAGNOSIS — O9902 Anemia complicating childbirth: Secondary | ICD-10-CM | POA: Diagnosis present

## 2019-07-01 DIAGNOSIS — Z23 Encounter for immunization: Secondary | ICD-10-CM | POA: Diagnosis not present

## 2019-07-01 DIAGNOSIS — J4550 Severe persistent asthma, uncomplicated: Secondary | ICD-10-CM | POA: Diagnosis present

## 2019-07-01 DIAGNOSIS — Z3490 Encounter for supervision of normal pregnancy, unspecified, unspecified trimester: Secondary | ICD-10-CM

## 2019-07-01 DIAGNOSIS — E669 Obesity, unspecified: Secondary | ICD-10-CM | POA: Diagnosis present

## 2019-07-01 DIAGNOSIS — Z3A39 39 weeks gestation of pregnancy: Secondary | ICD-10-CM

## 2019-07-01 DIAGNOSIS — O34211 Maternal care for low transverse scar from previous cesarean delivery: Secondary | ICD-10-CM | POA: Diagnosis not present

## 2019-07-01 DIAGNOSIS — O9952 Diseases of the respiratory system complicating childbirth: Secondary | ICD-10-CM | POA: Diagnosis present

## 2019-07-01 DIAGNOSIS — J45909 Unspecified asthma, uncomplicated: Secondary | ICD-10-CM | POA: Diagnosis present

## 2019-07-01 DIAGNOSIS — J4540 Moderate persistent asthma, uncomplicated: Secondary | ICD-10-CM | POA: Diagnosis present

## 2019-07-01 DIAGNOSIS — Z880 Allergy status to penicillin: Secondary | ICD-10-CM | POA: Diagnosis not present

## 2019-07-01 DIAGNOSIS — O99214 Obesity complicating childbirth: Secondary | ICD-10-CM | POA: Diagnosis present

## 2019-07-01 DIAGNOSIS — O134 Gestational [pregnancy-induced] hypertension without significant proteinuria, complicating childbirth: Secondary | ICD-10-CM | POA: Diagnosis not present

## 2019-07-01 DIAGNOSIS — Z98891 History of uterine scar from previous surgery: Secondary | ICD-10-CM

## 2019-07-01 SURGERY — Surgical Case
Anesthesia: Spinal

## 2019-07-01 MED ORDER — IBUPROFEN 600 MG PO TABS
600.0000 mg | ORAL_TABLET | Freq: Four times a day (QID) | ORAL | Status: DC | PRN
  Administered 2019-07-02: 600 mg via ORAL
  Filled 2019-07-01: qty 1

## 2019-07-01 MED ORDER — PHENYLEPHRINE HCL-NACL 20-0.9 MG/250ML-% IV SOLN
INTRAVENOUS | Status: AC
  Filled 2019-07-01: qty 250

## 2019-07-01 MED ORDER — PHENYLEPHRINE HCL-NACL 20-0.9 MG/250ML-% IV SOLN
INTRAVENOUS | Status: DC | PRN
  Administered 2019-07-01: 60 ug/min via INTRAVENOUS

## 2019-07-01 MED ORDER — MORPHINE SULFATE (PF) 0.5 MG/ML IJ SOLN
INTRAMUSCULAR | Status: AC
  Filled 2019-07-01: qty 10

## 2019-07-01 MED ORDER — ONDANSETRON HCL 4 MG/2ML IJ SOLN
INTRAMUSCULAR | Status: DC | PRN
  Administered 2019-07-01: 4 mg via INTRAVENOUS

## 2019-07-01 MED ORDER — COCONUT OIL OIL: 1.0000 "application " | TOPICAL_OIL | Status: DC | PRN

## 2019-07-01 MED ORDER — SOD CITRATE-CITRIC ACID 500-334 MG/5ML PO SOLN
30.0000 mL | Freq: Once | ORAL | Status: AC
  Administered 2019-07-01: 30 mL via ORAL

## 2019-07-01 MED ORDER — ACETAMINOPHEN 500 MG PO TABS
1000.0000 mg | ORAL_TABLET | Freq: Four times a day (QID) | ORAL | Status: DC | PRN
  Administered 2019-07-01 – 2019-07-02 (×4): 1000 mg via ORAL
  Filled 2019-07-01 (×4): qty 2

## 2019-07-01 MED ORDER — SIMETHICONE 80 MG PO CHEW
80.0000 mg | CHEWABLE_TABLET | Freq: Three times a day (TID) | ORAL | Status: DC
  Administered 2019-07-02 – 2019-07-03 (×2): 80 mg via ORAL
  Filled 2019-07-01 (×2): qty 1

## 2019-07-01 MED ORDER — DEXAMETHASONE SODIUM PHOSPHATE 10 MG/ML IJ SOLN
INTRAMUSCULAR | Status: DC | PRN
  Administered 2019-07-01: 5 mg via INTRAVENOUS

## 2019-07-01 MED ORDER — PNEUMOCOCCAL VAC POLYVALENT 25 MCG/0.5ML IJ INJ
0.5000 mL | INJECTION | INTRAMUSCULAR | Status: AC
  Administered 2019-07-03: 0.5 mL via INTRAMUSCULAR
  Filled 2019-07-01: qty 0.5

## 2019-07-01 MED ORDER — LACTATED RINGERS IV SOLN
INTRAVENOUS | Status: DC
  Administered 2019-07-01: 21:00:00 via INTRAVENOUS

## 2019-07-01 MED ORDER — OXYTOCIN 40 UNITS IN NORMAL SALINE INFUSION - SIMPLE MED: 2.5000 [IU]/h | INTRAVENOUS | Status: AC

## 2019-07-01 MED ORDER — ENOXAPARIN SODIUM 40 MG/0.4ML ~~LOC~~ SOLN
40.0000 mg | SUBCUTANEOUS | Status: DC
  Filled 2019-07-01: qty 0.4

## 2019-07-01 MED ORDER — SCOPOLAMINE 1 MG/3DAYS TD PT72
1.0000 | MEDICATED_PATCH | Freq: Once | TRANSDERMAL | Status: DC
  Administered 2019-07-01: 1.5 mg via TRANSDERMAL

## 2019-07-01 MED ORDER — CLINDAMYCIN PHOSPHATE 900 MG/50ML IV SOLN
900.0000 mg | INTRAVENOUS | Status: AC
  Administered 2019-07-01: 900 mg via INTRAVENOUS

## 2019-07-01 MED ORDER — SCOPOLAMINE 1 MG/3DAYS TD PT72
MEDICATED_PATCH | TRANSDERMAL | Status: AC
  Filled 2019-07-01: qty 1

## 2019-07-01 MED ORDER — FENTANYL CITRATE (PF) 100 MCG/2ML IJ SOLN
INTRAMUSCULAR | Status: AC
  Filled 2019-07-01: qty 2

## 2019-07-01 MED ORDER — HYDROMORPHONE HCL 2 MG PO TABS
2.0000 mg | ORAL_TABLET | ORAL | Status: DC | PRN
  Administered 2019-07-01 – 2019-07-02 (×2): 2 mg via ORAL
  Filled 2019-07-01 (×2): qty 1

## 2019-07-01 MED ORDER — MORPHINE SULFATE (PF) 0.5 MG/ML IJ SOLN: INTRAMUSCULAR | Status: DC | PRN

## 2019-07-01 MED ORDER — SENNOSIDES-DOCUSATE SODIUM 8.6-50 MG PO TABS
2.0000 | ORAL_TABLET | ORAL | Status: DC
  Filled 2019-07-01 (×2): qty 2

## 2019-07-01 MED ORDER — SIMETHICONE 80 MG PO CHEW
80.0000 mg | CHEWABLE_TABLET | ORAL | Status: DC
  Administered 2019-07-02 (×2): 80 mg via ORAL
  Filled 2019-07-01 (×2): qty 1

## 2019-07-01 MED ORDER — TETANUS-DIPHTH-ACELL PERTUSSIS 5-2.5-18.5 LF-MCG/0.5 IM SUSP: 0.5000 mL | Freq: Once | INTRAMUSCULAR | Status: DC

## 2019-07-01 MED ORDER — OXYTOCIN 40 UNITS IN NORMAL SALINE INFUSION - SIMPLE MED
INTRAVENOUS | Status: DC | PRN
  Administered 2019-07-01: 40 [IU] via INTRAVENOUS

## 2019-07-01 MED ORDER — ONDANSETRON HCL 4 MG/2ML IJ SOLN
INTRAMUSCULAR | Status: AC
  Filled 2019-07-01: qty 2

## 2019-07-01 MED ORDER — SODIUM CHLORIDE 0.9 % IV SOLN
INTRAVENOUS | Status: DC | PRN
  Administered 2019-07-01: 11:00:00 via INTRAVENOUS

## 2019-07-01 MED ORDER — OXYTOCIN 40 UNITS IN NORMAL SALINE INFUSION - SIMPLE MED
INTRAVENOUS | Status: AC
  Filled 2019-07-01: qty 1000

## 2019-07-01 MED ORDER — MENTHOL 3 MG MT LOZG: 1.0000 | LOZENGE | OROMUCOSAL | Status: DC | PRN

## 2019-07-01 MED ORDER — CLINDAMYCIN PHOSPHATE 900 MG/50ML IV SOLN
INTRAVENOUS | Status: AC
  Filled 2019-07-01: qty 50

## 2019-07-01 MED ORDER — FENTANYL CITRATE (PF) 100 MCG/2ML IJ SOLN
INTRAMUSCULAR | Status: DC | PRN
  Administered 2019-07-01: 75 ug via INTRAVENOUS

## 2019-07-01 MED ORDER — WITCH HAZEL-GLYCERIN EX PADS: 1.0000 "application " | MEDICATED_PAD | CUTANEOUS | Status: DC | PRN

## 2019-07-01 MED ORDER — PRENATAL MULTIVITAMIN CH: 1.0000 | ORAL_TABLET | Freq: Every day | ORAL | Status: DC

## 2019-07-01 MED ORDER — DIBUCAINE (PERIANAL) 1 % EX OINT: 1.0000 "application " | TOPICAL_OINTMENT | CUTANEOUS | Status: DC | PRN

## 2019-07-01 MED ORDER — LACTATED RINGERS IV SOLN
INTRAVENOUS | Status: DC
  Administered 2019-07-01: 09:00:00 via INTRAVENOUS

## 2019-07-01 MED ORDER — FENTANYL CITRATE (PF) 100 MCG/2ML IJ SOLN
25.0000 ug | INTRAMUSCULAR | Status: DC | PRN
  Administered 2019-07-01 (×2): 25 ug via INTRAVENOUS
  Administered 2019-07-01 (×2): 50 ug via INTRAVENOUS

## 2019-07-01 MED ORDER — GENTAMICIN SULFATE 40 MG/ML IJ SOLN
5.0000 mg/kg | INTRAVENOUS | Status: AC
  Administered 2019-07-01: 410 mg via INTRAVENOUS
  Filled 2019-07-01: qty 10.25

## 2019-07-01 MED ORDER — SOD CITRATE-CITRIC ACID 500-334 MG/5ML PO SOLN
ORAL | Status: AC
  Filled 2019-07-01: qty 30

## 2019-07-01 MED ORDER — ZOLPIDEM TARTRATE 5 MG PO TABS: 5.0000 mg | ORAL_TABLET | Freq: Every evening | ORAL | Status: DC | PRN

## 2019-07-01 MED ORDER — BUPIVACAINE IN DEXTROSE 0.75-8.25 % IT SOLN
INTRATHECAL | Status: DC | PRN
  Administered 2019-07-01: 1.9 mL via INTRATHECAL

## 2019-07-01 MED ORDER — ONDANSETRON HCL 4 MG/2ML IJ SOLN: 4.0000 mg | Freq: Once | INTRAMUSCULAR | Status: DC | PRN

## 2019-07-01 MED ORDER — SIMETHICONE 80 MG PO CHEW: 80.0000 mg | CHEWABLE_TABLET | ORAL | Status: DC | PRN

## 2019-07-01 MED ORDER — KETOROLAC TROMETHAMINE 30 MG/ML IJ SOLN
30.0000 mg | Freq: Four times a day (QID) | INTRAMUSCULAR | Status: DC | PRN
  Administered 2019-07-01 – 2019-07-02 (×3): 30 mg via INTRAVENOUS
  Filled 2019-07-01 (×3): qty 1

## 2019-07-01 SURGICAL SUPPLY — 39 items
APL SKNCLS STERI-STRIP NONHPOA (GAUZE/BANDAGES/DRESSINGS) ×1
BENZOIN TINCTURE PRP APPL 2/3 (GAUZE/BANDAGES/DRESSINGS) ×1 IMPLANT
CHLORAPREP W/TINT 26ML (MISCELLANEOUS) ×2 IMPLANT
CLAMP CORD UMBIL (MISCELLANEOUS) IMPLANT
CLOTH BEACON ORANGE TIMEOUT ST (SAFETY) ×2 IMPLANT
DRSG OPSITE POSTOP 4X10 (GAUZE/BANDAGES/DRESSINGS) ×2 IMPLANT
ELECT REM PT RETURN 9FT ADLT (ELECTROSURGICAL) ×2
ELECTRODE REM PT RTRN 9FT ADLT (ELECTROSURGICAL) ×1 IMPLANT
EXTRACTOR VACUUM KIWI (MISCELLANEOUS) IMPLANT
GLOVE BIOGEL PI IND STRL 7.0 (GLOVE) ×1 IMPLANT
GLOVE BIOGEL PI IND STRL 9 (GLOVE) ×1 IMPLANT
GLOVE BIOGEL PI INDICATOR 7.0 (GLOVE) ×1
GLOVE BIOGEL PI INDICATOR 9 (GLOVE) ×1
GLOVE SS PI 9.0 STRL (GLOVE) ×2 IMPLANT
GOWN STRL REUS W/TWL 2XL LVL3 (GOWN DISPOSABLE) ×2 IMPLANT
GOWN STRL REUS W/TWL LRG LVL3 (GOWN DISPOSABLE) ×2 IMPLANT
NDL HYPO 25X5/8 SAFETYGLIDE (NEEDLE) IMPLANT
NEEDLE HYPO 25X5/8 SAFETYGLIDE (NEEDLE) IMPLANT
NS IRRIG 1000ML POUR BTL (IV SOLUTION) ×2 IMPLANT
PACK C SECTION WH (CUSTOM PROCEDURE TRAY) ×2 IMPLANT
PAD OB MATERNITY 4.3X12.25 (PERSONAL CARE ITEMS) ×2 IMPLANT
PENCIL SMOKE EVAC W/HOLSTER (ELECTROSURGICAL) ×2 IMPLANT
RTRCTR C-SECT PINK 25CM LRG (MISCELLANEOUS) IMPLANT
RTRCTR C-SECT PINK 34CM XLRG (MISCELLANEOUS) IMPLANT
STRIP CLOSURE SKIN 1/2X4 (GAUZE/BANDAGES/DRESSINGS) IMPLANT
STRIP CLOSURE SKIN 1X5 (GAUZE/BANDAGES/DRESSINGS) ×1 IMPLANT
SUT MNCRL 0 VIOLET CTX 36 (SUTURE) ×2 IMPLANT
SUT MONOCRYL 0 CTX 36 (SUTURE) ×2
SUT PLAIN 2 0 (SUTURE) ×10
SUT PLAIN ABS 2-0 CT1 27XMFL (SUTURE) IMPLANT
SUT VIC AB 0 CT1 27 (SUTURE) ×2
SUT VIC AB 0 CT1 27XBRD ANBCTR (SUTURE) ×1 IMPLANT
SUT VIC AB 2-0 CT1 27 (SUTURE) ×2
SUT VIC AB 2-0 CT1 TAPERPNT 27 (SUTURE) ×1 IMPLANT
SUT VIC AB 4-0 KS 27 (SUTURE) ×2 IMPLANT
SYR BULB IRRIGATION 50ML (SYRINGE) IMPLANT
TOWEL OR 17X24 6PK STRL BLUE (TOWEL DISPOSABLE) ×2 IMPLANT
TRAY FOLEY W/BAG SLVR 14FR LF (SET/KITS/TRAYS/PACK) ×2 IMPLANT
WATER STERILE IRR 1000ML POUR (IV SOLUTION) ×2 IMPLANT

## 2019-07-01 NOTE — Op Note (Signed)
07/01/2019  11:48 AM  PATIENT:  Tammy Chapman  28 y.o. female  PRE-OPERATIVE DIAGNOSIS:  RPT CESAREAN section PREG 39 WEEKS  POST-OPERATIVE DIAGNOSIS:  RPT CESAREAN section PREG 39 WEEKS  PROCEDURE:  Procedure(s): REPEAT CESAREAN SECTION (N/A)  SURGEON:  Surgeon(s) and Role:    * Jonnie Kind, MD - Primary    * Clarnce Flock, MD - Fellow  PHYSICIAN ASSISTANT:   ASSISTANTS: none   ANESTHESIA:   spinal  EBL:  450 cc estimate   BLOOD ADMINISTERED:none  DRAINS: Urinary Catheter (Foley)   LOCAL MEDICATIONS USED:  NONE  SPECIMEN:  Source of Specimen:  Placenta to labor and delivery  DISPOSITION OF SPECIMEN:  Labor and delivery  COUNTS:  YES  TOURNIQUET:  * No tourniquets in log *  DICTATION: .Dragon Dictation  PLAN OF CARE: Admit to inpatient   PATIENT DISPOSITION:  PACU - hemodynamically stable.   Delay start of Pharmacological VTE agent (>24hrs) due to surgical blood loss or risk of bleeding: not applicable Details of procedure: Patient was taken the operating room prepped and draped in the usual fashion after spinal anesthesia, with antibiotics administered recognizing patient's multiple allergies, gentamicin and clindamycin.  Timeout was conducted and procedure confirmed by surgical team.  Transverse lower abdominal incision was repeated, excising a 20 cm long by 8 cm wide ellipse of skin and subcutaneous fatty tissue to allow for improved incision placement.  Fascia was opened transversely in standard fashion, peritoneum was opened in the midline and there was essentially no adhesions noted.  Bladder flap was developed, transverse uterine incision performed extended cephalad and caudad and fetal vertex rotated into the incision. Fetus delivered with fundal pressure with ease.  Axillary traction was used during the delivery.  At 1 minute the cord was clamped and baby passed to the waiting pediatrician. The placenta delivered spontaneously intact in response to  Crede uterine massage and Pitocin.  Membranes were well extracted.  The uterus was exteriorized for visibility and access.  The uterine closure was performed after swabbing out the uterus with moistened lap, consisting of a running locking first layer of 0 Monocryl followed by continuous running second layer.  Bladder flap was reapproximated during the second layer Abdomen was irrigated and suctioned, the anterior peritoneum closed with running 2-0 Vicryl. The fascia was closed with continuous running 0 Vicryl.  Subcutaneous tissues were irrigated confirmed as hemostatic, then reapproximated with a series of 2 layers of horizontal mattress sutures of 2-0 plain resulting in good closure of the potential space.  Subcuticular 4-0 Vicryl closure of the skin incision was performed with good tissue edge approximation Steri-Strips were applied, benzoin having been placed on the skin and then honeycomb dressing applied.  Condition to recovery room good, wand confirmation of lap count was performed and was correct

## 2019-07-01 NOTE — Anesthesia Postprocedure Evaluation (Signed)
Anesthesia Post Note  Patient: Tammy Chapman  Procedure(s) Performed: REPEAT CESAREAN SECTION (N/A )     Patient location during evaluation: PACU Anesthesia Type: Spinal Level of consciousness: awake and alert and oriented Pain management: pain level controlled Vital Signs Assessment: post-procedure vital signs reviewed and stable Respiratory status: spontaneous breathing, nonlabored ventilation and respiratory function stable Cardiovascular status: blood pressure returned to baseline and stable Postop Assessment: no apparent nausea or vomiting, no headache, no backache, spinal receding and patient able to bend at knees Anesthetic complications: no    Last Vitals:  Vitals:   07/01/19 1313 07/01/19 1440  BP: 109/67 108/67  Pulse: (!) 54 (!) 48  Resp: 18 20  Temp: 36.7 C 36.8 C  SpO2: 99% 98%    Last Pain:  Vitals:   07/01/19 1440  TempSrc: Oral  PainSc:    Pain Goal:                   Avika Carbine A.

## 2019-07-01 NOTE — Anesthesia Preprocedure Evaluation (Addendum)
Anesthesia Evaluation  Patient identified by MRN, date of birth, ID band Patient awake    Reviewed: Allergy & Precautions, NPO status , Patient's Chart, lab work & pertinent test results  Airway Mallampati: II  TM Distance: >3 FB Neck ROM: Full    Dental no notable dental hx. (+) Teeth Intact   Pulmonary asthma ,    Pulmonary exam normal breath sounds clear to auscultation       Cardiovascular hypertension, Normal cardiovascular exam Rhythm:Regular Rate:Normal     Neuro/Psych negative neurological ROS  negative psych ROS   GI/Hepatic Neg liver ROS, GERD  Medicated and Controlled,  Endo/Other  diabetes, Well Controlled, GestationalObesity  Renal/GU negative Renal ROS  negative genitourinary   Musculoskeletal   Abdominal (+) + obese,   Peds  Hematology  (+) anemia ,   Anesthesia Other Findings   Reproductive/Obstetrics (+) Pregnancy Previous C/Section Gestational HTN                            Anesthesia Physical Anesthesia Plan  ASA: II  Anesthesia Plan: Spinal   Post-op Pain Management:    Induction:   PONV Risk Score and Plan: 4 or greater and Ondansetron, Treatment may vary due to age or medical condition and Scopolamine patch - Pre-op  Airway Management Planned: Natural Airway  Additional Equipment:   Intra-op Plan:   Post-operative Plan: Extubation in OR  Informed Consent: I have reviewed the patients History and Physical, chart, labs and discussed the procedure including the risks, benefits and alternatives for the proposed anesthesia with the patient or authorized representative who has indicated his/her understanding and acceptance.     Dental advisory given  Plan Discussed with: CRNA and Surgeon  Anesthesia Plan Comments:        Anesthesia Quick Evaluation

## 2019-07-01 NOTE — H&P (Signed)
LABOR AND DELIVERY ADMISSION HISTORY AND PHYSICAL NOTE  Tammy Chapman is a 28 y.o. female G2P1001 with IUP at [redacted]w[redacted]d by 8wk [place] presenting for repeat CS.   She reports positive fetal movement. She denies leakage of fluid or vaginal bleeding.   She plans on bottle feeding. She plans on Paragard IUD for birth control, to be placed in the office.  Prenatal History/Complications: PNC at [facility]  Sono:  @[redacted]w[redacted]d , CWD, normal anatomy, breech presentation, anterior placenta, 73%ile, EFW [id number]  Pregnancy complications:  - Asthma w exacerbation during pregnancy  Past Medical History: Past Medical History:  Diagnosis Date  . Asthma   . Bronchitis, mucopurulent recurrent ([place]) 01/19/2019  . Chronic asthma 01/19/2019  . GERD (gastroesophageal reflux disease)   . Loose body of right knee 02/2013  . Mosquito bite 03/08/2013  . Pregnancy induced hypertension   . Recurrent otitis media of both ears 01/19/2019  . Sprain and strain of medial collateral ligament of knee 02/2013   right    Past Surgical History: Past Surgical History:  Procedure Laterality Date  . CESAREAN SECTION N/A 01/08/2017   Procedure: CESAREAN SECTION;  Surgeon: Jonnie Kind, MD;  Location: [place];  Service: Obstetrics;  Laterality: N/A;  . CHOLECYSTECTOMY  01/20/2008  . EAR TUBE REMOVAL  2002  . ESOPHAGOGASTRODUODENOSCOPY  05/15/2011   Procedure: ESOPHAGOGASTRODUODENOSCOPY (EGD);  Surgeon: Rogene Houston, MD;  [facility];  Service: Endoscopy;  Laterality: N/A;  3:15   . KNEE ARTHROSCOPY Right 03/15/2013   Procedure: RIGHT  KNEE ARTHROSCOPY, FEMORAL PATELLA REEFING, LATERAL RELEASE, PARTIAL LATERAL MENISCECTOMY ;  Surgeon: Lorn Junes, MD;  Location: [place];  Service: Orthopedics;  Laterality: Right;  . TONSILLECTOMY AND ADENOIDECTOMY  1999  . TYMPANOSTOMY TUBE PLACEMENT  1999, 2011    Obstetrical History: OB History    Gravida  2   Para  1   Term  1   Preterm      AB       Living  1     SAB      TAB      Ectopic      Multiple      Live Births  1           Social History: Social History   Socioeconomic History  . Marital status: Married    Spouse name: Not on file  . Number of children: 1  . Years of education: Not on file  . Highest education level: Not on file  Occupational History  . Not on file  Social Needs  . Financial resource strain: Not on file  . Food insecurity    Worry: Not on file    Inability: Not on file  . Transportation needs    Medical: Not on file    Non-medical: Not on file  Tobacco Use  . Smoking status: Never Smoker  . Smokeless tobacco: Never Used  Substance and Sexual Activity  . Alcohol use: No  . Drug use: No  . Sexual activity: Yes    Partners: Male    Birth control/protection: None  Lifestyle  . Physical activity    Days per week: Not on file    Minutes per session: Not on file  . Stress: Not on file  Relationships  . Social Herbalist on phone: Not on file    Gets together: Not on file    Attends religious service: Not on file  Active member of club or organization: Not on file    Attends meetings of clubs or organizations: Not on file    Relationship status: Not on file  Other Topics Concern  . Not on file  Social History Narrative  . Not on file    Family History: Family History  Problem Relation Age of Onset  . Diabetes Mother   . Cancer Paternal Grandfather        renal  . Diabetes Maternal Grandfather   . COPD Maternal Grandfather   . Congestive Heart Failure Maternal Grandfather     Allergies: Allergies  Allergen Reactions  . Benadryl [Diphenhydramine Hcl] Shortness Of Breath    Pt states that she feels fine if she sits down, but if she is moving around after taking benadryl, she feels SOB  . Penicillins Shortness Of Breath    Has patient had a PCN reaction causing immediate rash, facial/tongue/throat swelling, SOB or lightheadedness with hypotension:  yes Has patient had a PCN reaction causing severe rash involving mucus membranes or skin necrosis: no Has patient had a PCN reaction that required hospitalization: no Has patient had a PCN reaction occurring within the last 10 years: no If all of the above answers are "NO", then may proceed with Cephalosporin use. Patient reports she CAN TAKE kEFLEX AND SUPRAX  . Adhesive [Tape] Hives  . Percocet [Oxycodone-Acetaminophen]   . Lactose Intolerance (Gi) Diarrhea and Nausea And Vomiting  . Latex Rash  . Sulfa Antibiotics Rash  . Ultram [Tramadol Hcl] Rash    Medications Prior to Admission  Medication Sig Dispense Refill Last Dose  . acetaminophen (TYLENOL) 325 MG tablet Take 650 mg by mouth every 6 (six) hours as needed for moderate pain.   Past Week at Unknown time  . cephALEXin (KEFLEX) 500 MG capsule Take 1 capsule (500 mg total) by mouth 3 (three) times daily. 21 capsule 0 Past Week at Unknown time  . chlorpheniramine-HYDROcodone (TUSSIONEX PENNKINETIC ER) 10-8 MG/5ML SUER Take 5 mLs by mouth every 12 (twelve) hours as needed for cough. (Patient not taking: Reported on 06/27/2019) 140 mL 0   . esomeprazole (NEXIUM) 20 MG capsule Take 40 mg by mouth daily.    06/30/2019 at Unknown time  . levocetirizine (XYZAL) 5 MG tablet Take 1 tablet (5 mg total) by mouth every evening. 30 tablet 5 06/30/2019 at Unknown time  . montelukast (SINGULAIR) 10 MG tablet Take 1 tablet (10 mg total) by mouth at bedtime. 30 tablet 11 06/30/2019 at Unknown time  . neomycin-polymyxin-hydrocortisone (CORTISPORIN) OTIC solution Place 3 drops into the left ear 4 (four) times daily. (Patient not taking: Reported on 06/27/2019) 10 mL 0   . ondansetron (ZOFRAN ODT) 4 MG disintegrating tablet Take 1 tablet (4 mg total) by mouth every 6 (six) hours as needed for nausea. 30 tablet 2 06/30/2019 at Unknown time  . prenatal vitamin w/FE, FA (PRENATAL 1 + 1) 27-1 MG TABS tablet Take 1 tablet by mouth daily at 12 noon. 30 each 12  06/30/2019 at Unknown time  . PROAIR HFA 108 (90 Base) MCG/ACT inhaler Inhale 2 puffs into the lungs every 4 (four) hours as needed for wheezing or shortness of breath. 54 g 1 Past Month at Unknown time  . SYMBICORT 160-4.5 MCG/ACT inhaler Inhale 2 puffs into the lungs 2 (two) times daily.   12 07/01/2019 at Unknown time  . Blood Pressure Monitoring (BLOOD PRESSURE CUFF) MISC 1 Device by Does not apply route once a week. 1 each  0   . neomycin-polymyxin b-dexamethasone (MAXITROL) 3.5-10000-0.1 SUSP Place 2 drops into both eyes every 6 (six) hours. (Patient not taking: Reported on 06/27/2019) 5 mL 0      Review of Systems  All systems reviewed and negative except as stated in HPI  Physical Exam Blood pressure 117/86, pulse 95, temperature 98.1 F (36.7 C), temperature source Oral, resp. rate 18, height 5\' 10"  (1.778 m), weight 100.2 kg, last menstrual period 09/22/2018, SpO2 99 %. General appearance: alert, oriented, NAD Lungs: normal respiratory effort Heart: regular rate Abdomen: soft, non-tender; gravid Extremities: No calf swelling or tenderness FHR: 136 bpm  Prenatal labs: ABO, Rh: --/--/A POS (11/11 0941) Antibody: NEG (11/11 0941) Rubella: 6.02 (06/19 0959) RPR: NON REACTIVE (11/11 0941)  HBsAg: Negative (06/19 0959)  HIV: Non Reactive (08/26 0852)  GC/Chlamydia: neg/neg 06/15/2019  GBS: --[place] (10/28 0000)  2-hr GTT: normal 04/13/2019 Genetic screening:  declined Anatomy US: normal  Prenatal Transfer Tool  Maternal Diabetes: No Genetic Screening: Declined Maternal Ultrasounds/Referrals: Normal Fetal Ultrasounds or other Referrals:  None Maternal Substance Abuse:  No Significant Maternal Medications:  Meds include: Other: Albuterol, symbicort, singulair Significant Maternal Lab Results: Group B Strep negative  No results found for this or any previous visit (from the past 24 hour(s)).  Patient Active Problem List   Diagnosis Date Noted  . Status post repeat low  transverse cesarean section 07/01/2019  . Vaginal bleeding in pregnancy, third trimester 05/06/2019  . Chronic asthma 01/19/2019  . Bronchitis, mucopurulent recurrent ([place]) 01/19/2019  . Recurrent otitis media of both ears 01/19/2019  . Supervision of normal pregnancy 12/29/2018  . History of gestational hypertension 12/29/2018  . Previous cesarean section 12/29/2018  . Abnormality of gait 04/08/2012  . GERD (gastroesophageal reflux disease) 08/04/2011    Assessment: Tammy Chapman is a 28 y.o. G2P1001 at [redacted]w[redacted]d here for scheduled CS.  #RCS: Patient desires repeat cesarean due to history of prior cesarean and declines vaginal attempt at delivery.    The risks of cesarean section discussed with the patient included but were not limited to: bleeding which may require transfusion or reoperation; infection which may require antibiotics; injury to bowel, bladder, ureters or other surrounding organs; injury to the fetus; need for additional procedures including hysterectomy in the event of a life-threatening hemorrhage; placental abnormalities with subsequent pregnancies, incisional problems, thromboembolic phenomenon and other postoperative/anesthesia complications. The patient concurred with the proposed plan, giving informed written consent for the procedure. Patient has been NPO since last night she will remain NPO for procedure. Anesthesia and OR aware. Preoperative prophylactic antibiotics and SCDs ordered on call to the OR. To OR when ready.   #Anesthesia: Spinal #FWB: FHR 136 #GBS/ID: negative #COVID: swab negative on 06/29/2019 #MOF: Bottle #MOC: Paragard IUD, office #Circ: n/a  #Asthma: will cont home medication regimen.   Annice Needy [facility] 07/01/2019, 9:24 AM

## 2019-07-01 NOTE — Discharge Summary (Signed)
Postpartum Discharge Summary     Patient Name: Tammy Chapman DOB: 08/22/1990 MRN: [id number]  Date of admission: 07/01/2019 Delivering Provider: Jonnie Kind   Date of discharge: 07/03/2019  Admitting diagnosis: PRIOR CESAREAN, PREG 43 WEEKS Intrauterine pregnancy: [redacted]w[redacted]d    Secondary diagnosis:  Active Problems:   Supervision of normal pregnancy   Previous cesarean section   Chronic asthma   Status post repeat low transverse cesarean section   [redacted] weeks gestation of pregnancy  Additional problems: None     Discharge diagnosis: Term Pregnancy Delivered                                                                                                Post partum procedures:None  Augmentation: n/a  Complications: None  Hospital course:  Sceduled C/S   28y.o. yo G2P1001 at 311w0das admitted to the hospital 07/01/2019 for scheduled cesarean section with the following indication:Elective Repeat.  Membrane Rupture Time/Date: 10:47 AM ,07/01/2019   Patient delivered a Viable infant.07/01/2019  Details of operation can be found in separate operative note.  Pateint had an uncomplicated postpartum course. Received post-placental IUD. Ferrous sulfate initiated on discharge. She is ambulating, tolerating a regular diet, passing flatus, and urinating well. Patient is discharged home in stable condition on  07/03/19        Delivery time: 10:47 AM    Magnesium Sulfate received: No BMZ received: No Rhophylac:N/A MMR:N/A Transfusion:No  Physical exam  Vitals:   07/02/19 0500 07/02/19 1430 07/02/19 2354 07/03/19 0510  BP: (!) 101/55 (!) 105/59 108/62 115/78  Pulse: (!) 50 (!) 57 70 63  Resp: _0 Temp: 97.8 F (36.6 C) 98 F (36.7 C) 98.8 F (37.1 C) 99 F (37.2 C)  TempSrc: Oral Oral Oral Oral  SpO2:      Weight:      Height:       General: alert, cooperative and no distress Lochia: appropriate Uterine Fundus: firm Incision: Healing well with no significant  drainage, No significant erythema, Dressing is clean, dry, and intact DVT Evaluation: No evidence of DVT seen on physical exam. Labs: Lab Results  Component Value Date   WBC 12.6 (H) 07/02/2019   HGB 9.5 (L) 07/02/2019   HCT 29.9 (L) 07/02/2019   MCV 76.1 (L) 07/02/2019   PLT 168 07/02/2019   CMP Latest Ref Rng & Units 07/02/2019  Glucose 65 - 99 mg/dL -  BUN 6 - 20 mg/dL -  Creatinine 0.44 - 1.00 mg/dL 0.66  Sodium 134 - 144 mmol/L -  Potassium 3.5 - 5.2 mmol/L -  Chloride 96 - 106 mmol/L -  CO2 20 - 29 mmol/L -  Calcium 8.7 - 10.2 mg/dL -  Total Protein 6.0 - 8.5 g/dL -  Total Bilirubin 0.0 - 1.2 mg/dL -  Alkaline Phos 39 - 117 IU/L -  AST 0 - 40 IU/L -  ALT 0 - 32 IU/L -    Discharge instruction: per After Visit Summary and "Baby and Me Booklet".  After visit meds:  Allergies as of 07/03/2019  Reactions   Benadryl [diphenhydramine Hcl] Shortness Of Breath   Pt states that she feels fine if she sits down, but if she is moving around after taking benadryl, she feels SOB   Penicillins Shortness Of Breath   Has patient had a PCN reaction causing immediate rash, facial/tongue/throat swelling, SOB or lightheadedness with hypotension: yes Has patient had a PCN reaction causing severe rash involving mucus membranes or skin necrosis: no Has patient had a PCN reaction that required hospitalization: no Has patient had a PCN reaction occurring within the last 10 years: no If all of the above answers are "NO", then may proceed with Cephalosporin use. Patient reports she CAN TAKE kEFLEX AND SUPRAX   Adhesive [tape] Hives   Percocet [oxycodone-acetaminophen]    Lactose Intolerance (gi) Diarrhea, Nausea And Vomiting   Latex Rash   Sulfa Antibiotics Rash   Ultram [tramadol Hcl] Rash      Medication List    STOP taking these medications   Blood Pressure Cuff Misc   cephALEXin 500 MG capsule Commonly known as: KEFLEX   chlorpheniramine-HYDROcodone 10-8 MG/5ML  Suer Commonly known as: Tussionex Pennkinetic ER   esomeprazole 20 MG capsule Commonly known as: NEXIUM   neomycin-polymyxin b-dexamethasone 3.5-10000-0.1 Susp Commonly known as: MAXITROL   neomycin-polymyxin-hydrocortisone OTIC solution Commonly known as: CORTISPORIN   ondansetron 4 MG disintegrating tablet Commonly known as: Zofran ODT     TAKE these medications   acetaminophen 500 MG tablet Commonly known as: TYLENOL Take 2 tablets (1,000 mg total) by mouth every 8 (eight) hours as needed for mild pain. What changed:   medication strength  how much to take  when to take this  reasons to take this   ferrous sulfate 325 (65 FE) MG tablet Take 1 tablet (325 mg total) by mouth daily with breakfast.   HYDROmorphone 2 MG tablet Commonly known as: DILAUDID Take 1 tablet (2 mg total) by mouth every 4 (four) hours as needed for severe pain.   ibuprofen 600 MG tablet Commonly known as: ADVIL Take 1 tablet (600 mg total) by mouth every 6 (six) hours as needed for moderate pain.   levocetirizine 5 MG tablet Commonly known as: Xyzal Take 1 tablet (5 mg total) by mouth every evening.   montelukast 10 MG tablet Commonly known as: SINGULAIR Take 1 tablet (10 mg total) by mouth at bedtime.   prenatal vitamin w/FE, FA 27-1 MG Tabs tablet Take 1 tablet by mouth daily at 12 noon.   ProAir HFA 108 (90 Base) MCG/ACT inhaler Generic drug: albuterol Inhale 2 puffs into the lungs every 4 (four) hours as needed for wheezing or shortness of breath.   senna-docusate 8.6-50 MG tablet Commonly known as: Senokot-S Take 2 tablets by mouth at bedtime as needed for mild constipation.   Symbicort 160-4.5 MCG/ACT inhaler Generic drug: budesonide-formoterol Inhale 2 puffs into the lungs 2 (two) times daily.       Diet: routine diet  Activity: Advance as tolerated. Pelvic rest for 6 weeks.   Outpatient follow up:6 weeks Follow up Appt: Future Appointments  Date Time Provider  [place]  07/06/2019  8:50 AM Jonnie Kind, MD [facility]  08/04/2019 10:50 AM Cresenzo-Dishmon, Joaquim Lai, CNM [facility]   Follow up Visit:   Please schedule this patient for Postpartum visit in: 6 weeks with the following provider: Any provider For C/S patients schedule nurse incision check in weeks 2 weeks: yes Low risk pregnancy complicated by: n/a Delivery mode:  CS Anticipated Birth  Control:  IUD PP Procedures needed: Incision check  Schedule Integrated BH visit: no    Newborn Data: Live born female  Birth Weight:  3800g APGAR: 42, 9  Newborn Delivery   Birth date/time: 07/01/2019 10:47:00 Delivery type: C-Section, Low Transverse Trial of labor: No C-section categorization: Repeat      Baby Feeding: Bottle Disposition:home with mother   07/03/2019 Chauncey Mann, MD

## 2019-07-01 NOTE — Brief Op Note (Addendum)
07/01/2019  11:48 AM  PATIENT:  Tammy Chapman  28 y.o. female  PRE-OPERATIVE DIAGNOSIS:  RPT CESAREAN section PREG 39 WEEKS  POST-OPERATIVE DIAGNOSIS:  RPT CESAREAN section PREG 39 WEEKS  PROCEDURE:  Procedure(s): REPEAT CESAREAN SECTION (N/A)  SURGEON:  Surgeon(s) and Role:    * Jonnie Kind, MD - Primary    * Clarnce Flock, MD - Fellow  PHYSICIAN ASSISTANT:   ASSISTANTS: none   ANESTHESIA:   spinal  EBL:  450 cc estimate   BLOOD ADMINISTERED:none  DRAINS: Urinary Catheter (Foley)   LOCAL MEDICATIONS USED:  NONE  SPECIMEN:  Source of Specimen:  Placenta to labor and delivery  DISPOSITION OF SPECIMEN:  Labor and delivery  COUNTS:  YES  TOURNIQUET:  * No tourniquets in log *  DICTATION: .Dragon Dictation  PLAN OF CARE: Admit to inpatient   PATIENT DISPOSITION:  PACU - hemodynamically stable.   Delay start of Pharmacological VTE agent (>24hrs) due to surgical blood loss or risk of bleeding: not applicable Details of procedure: Patient was taken the operating room prepped and draped in the usual fashion after spinal anesthesia, with antibiotics administered recognizing patient's multiple allergies, gentamicin and clindamycin.  Timeout was conducted and procedure confirmed by surgical team.  Transverse lower abdominal incision was repeated, excising a 20 cm long by 8 cm wide ellipse of skin and subcutaneous fatty tissue to allow for improved incision placement.  Fascia was opened transversely in standard fashion, peritoneum was opened in the midline and there was essentially no adhesions noted.  Bladder flap was developed, transverse uterine incision performed extended cephalad and caudad and fetal vertex rotated into the incision. Fetus delivered with fundal pressure with ease.  Axillary traction was used during the delivery.  At 1 minute the cord was clamped and baby passed to the waiting pediatrician. The placenta delivered spontaneously intact in response to  Crede uterine massage and Pitocin.  Membranes were well extracted.  The uterus was exteriorized for visibility and access.  The uterine closure was performed after swabbing out the uterus with moistened lap, consisting of a running locking first layer of 0 Monocryl followed by continuous running second layer.  Bladder flap was reapproximated during the second layer Abdomen was irrigated and suctioned, the anterior peritoneum closed with running 2-0 Vicryl. The fascia was closed with continuous running 0 Vicryl.  Subcutaneous tissues were irrigated confirmed as hemostatic, then reapproximated with a series of 2 layers of horizontal mattress sutures of 2-0 plain resulting in good closure of the potential space.  Subcuticular 4-0 Vicryl closure of the skin incision was performed with good tissue edge approximation Steri-Strips were applied, benzoin having been placed on the skin and then honeycomb dressing applied.  Condition to recovery room good, wand confirmation of lap count was performed and was correct

## 2019-07-01 NOTE — Transfer of Care (Signed)
Immediate Anesthesia Transfer of Care Note  Patient: Tammy Chapman  Procedure(s) Performed: REPEAT CESAREAN SECTION (N/A )  Patient Location: PACU  Anesthesia Type:Spinal  Level of Consciousness: awake and alert   Airway & Oxygen Therapy: Patient Spontanous Breathing  Post-op Assessment: Report given to RN and Post -op Vital signs reviewed and stable  Post vital signs: Reviewed  Last Vitals:  Vitals Value Taken Time  BP 95/69 07/01/19 1200  Temp 36.4 C 07/01/19 1155  Pulse 63 07/01/19 1203  Resp 21 07/01/19 1203  SpO2 100 % 07/01/19 1203  Vitals shown include unvalidated device data.  Last Pain:  Vitals:   07/01/19 1155  TempSrc: Oral         Complications: No apparent anesthesia complications

## 2019-07-01 NOTE — Anesthesia Procedure Notes (Signed)
Spinal  Patient location during procedure: OR Start time: 07/01/2019 10:09 AM End time: 07/01/2019 10:13 AM Staffing Anesthesiologist: Josephine Igo, MD Performed: anesthesiologist  Preanesthetic Checklist Completed: patient identified, site marked, surgical consent, pre-op evaluation, timeout performed, IV checked, risks and benefits discussed and monitors and equipment checked Spinal Block Patient position: sitting Prep: DuraPrep Patient monitoring: heart rate, cardiac monitor, continuous pulse ox and blood pressure Approach: midline Location: L3-4 Injection technique: single-shot Needle Needle type: Pencan  Needle gauge: 24 G Needle length: 9 cm Needle insertion depth: 6 cm Assessment Sensory level: T4 Additional Notes Patient tolerated procedure well. Adequate sensory level.

## 2019-07-02 LAB — CBC
HCT: 29.9 % — ABNORMAL LOW (ref 36.0–46.0)
Hemoglobin: 9.5 g/dL — ABNORMAL LOW (ref 12.0–15.0)
MCH: 24.2 pg — ABNORMAL LOW (ref 26.0–34.0)
MCHC: 31.8 g/dL (ref 30.0–36.0)
MCV: 76.1 fL — ABNORMAL LOW (ref 80.0–100.0)
Platelets: 168 10*3/uL (ref 150–400)
RBC: 3.93 MIL/uL (ref 3.87–5.11)
RDW: 14.2 % (ref 11.5–15.5)
WBC: 12.6 10*3/uL — ABNORMAL HIGH (ref 4.0–10.5)
nRBC: 0 % (ref 0.0–0.2)

## 2019-07-02 LAB — CREATININE, SERUM
Creatinine, Ser: 0.66 mg/dL (ref 0.44–1.00)
GFR calc Af Amer: >60 mL/min (ref >60)
GFR calc non Af Amer: >60 mL/min (ref >60)

## 2019-07-02 LAB — BIRTH TISSUE RECOVERY COLLECTION (PLACENTA DONATION)

## 2019-07-02 NOTE — Progress Notes (Signed)
Subjective: Postpartum Day 1: Cesarean Delivery Patient reports feeling much better after having Foley taken out. She has been ambulating and voiding without difficulty. Tolerating PO. Minimal lochia. Pain well-controlled.  Objective: Vital signs in last 24 hours: Temp:  [97.6 F (36.4 C)-99.2 F (37.3 C)] 98.5 F (36.9 C) (11/14 0005) Pulse Rate:  [48-95] 49 (11/14 0005) Resp:  [13-21] 18 (11/14 0005) BP: (95-117)/(49-86) 105/59 (11/14 0005) SpO2:  [97 %-100 %] 97 % (11/13 1600) Weight:  [100.2 kg] 100.2 kg (11/13 0815)  Physical Exam:  General: alert, cooperative and appears stated age Lochia: appropriate Uterine Fundus: firm Incision: healing well, no significant drainage DVT Evaluation: No evidence of DVT seen on physical exam.  Recent Labs    06/29/19 0941  HGB 11.1*  HCT 34.8*    Assessment/Plan: Status post Cesarean section. Doing well postoperatively.  Continue current care. Plan for DC 11/15 F/u AM CBC Desires IUD outpatient Breast and bottle feeding UOP appropriate   Tammy Chapman N Jejuan Scala 07/02/2019, 1:31 AM

## 2019-07-03 ENCOUNTER — Encounter (HOSPITAL_COMMUNITY): Payer: Self-pay | Admitting: *Deleted

## 2019-07-03 DIAGNOSIS — Z3A39 39 weeks gestation of pregnancy: Secondary | ICD-10-CM

## 2019-07-03 MED ORDER — ACETAMINOPHEN 500 MG PO TABS: 1000.0000 mg | ORAL_TABLET | Freq: Three times a day (TID) | ORAL | 0 refills | Status: DC | PRN

## 2019-07-03 MED ORDER — FERROUS SULFATE 325 (65 FE) MG PO TABS: 325.0000 mg | ORAL_TABLET | Freq: Every day | ORAL | 0 refills | Status: DC

## 2019-07-03 MED ORDER — FERROUS SULFATE 325 (65 FE) MG PO TABS
325.0000 mg | ORAL_TABLET | Freq: Every day | ORAL | Status: DC
  Filled 2019-07-03: qty 1

## 2019-07-03 MED ORDER — HYDROMORPHONE HCL 2 MG PO TABS: 2.0000 mg | ORAL_TABLET | ORAL | 0 refills | Status: DC | PRN

## 2019-07-03 MED ORDER — SENNOSIDES-DOCUSATE SODIUM 8.6-50 MG PO TABS: 2.0000 | ORAL_TABLET | Freq: Every evening | ORAL | 0 refills | Status: DC | PRN

## 2019-07-03 MED ORDER — IBUPROFEN 600 MG PO TABS: 600.0000 mg | ORAL_TABLET | Freq: Four times a day (QID) | ORAL | 0 refills | Status: DC | PRN

## 2019-07-04 MED FILL — IBUPROFEN 600 MG TABLET: 600 | 8 days supply | Qty: 30 | Fill #0

## 2019-07-04 MED FILL — HYDROmorphone HCL 2 MG TABS: 2 | 2 days supply | Qty: 12 | Fill #0

## 2019-07-06 ENCOUNTER — Other Ambulatory Visit: Payer: Self-pay

## 2019-07-06 ENCOUNTER — Ambulatory Visit (INDEPENDENT_AMBULATORY_CARE_PROVIDER_SITE_OTHER): Payer: 59 | Admitting: Obstetrics and Gynecology

## 2019-07-06 VITALS — BP 108/71 | HR 79 | Ht 70.0 in | Wt 212.0 lb

## 2019-07-06 DIAGNOSIS — Z09 Encounter for follow-up examination after completed treatment for conditions other than malignant neoplasm: Secondary | ICD-10-CM

## 2019-07-06 DIAGNOSIS — Z9889 Other specified postprocedural states: Secondary | ICD-10-CM

## 2019-07-06 MED FILL — LEVOCETIRIZINE 5 MG TABLET: 5 | 30 days supply | Qty: 30 | Fill #1

## 2019-07-06 MED FILL — SYMBICORT 160-4.5 MCG INH: 160-4.5 | 30 days supply | Qty: 10 | Fill #4

## 2019-07-06 NOTE — Progress Notes (Signed)
Patient ID: Tammy Chapman, female   DOB: Aug 02, 1991, 28 y.o.   MRN: [id number]  Subjective:  Tammy Chapman is a 28 y.o. female now 1 weeks status post c-section.   Only used one day of pain meds. She has good mobility. She is not breast feeding and feels she needs fluid pill due to bloatedness Review of Systems Negative except bloatedness everywhere   Diet:   normal   Bowel movements : normal.  The patient is not having any pain.  Objective:  BP 108/71 (BP Location: Left Arm, Patient Position: Sitting, Cuff Size: Normal)   Pulse 79   Ht 5\' 10"  (1.778 m)   Wt 212 lb (96.2 kg)   Breastfeeding No   BMI 30.42 kg/m  General:Well developed, well nourished.  No acute distress. Abdomen: Bowel sounds normal, soft, non-tender. Pelvic Exam: Not done  Incision(s): Healing wonderfully, no drainage, no erythema, no hernia, no swelling, no dehiscence, soft, minimal edema above incision.       Assessment:  Post-Op 1 weeks s/p c-section  mimimal ecchymosis on right, no hematoma palpated Doing well postoperatively.   Plan:  1. Current medications.Hctz 25 mg x 1 week (bottle feeding) 2. Activity restrictions: routine postpartum 3. Follow up in 4 weeks.  By signing my name below, I, Samul Dada, attest that this documentation has been prepared under the direction and in the presence of Jonnie Kind, MD. Electronically Signed: [place]. 07/06/19. 9:00 AM.  I personally performed the services described in this documentation, which was SCRIBED in my presence. The recorded information has been reviewed and considered accurate. It has been edited as necessary during review. Jonnie Kind, MD

## 2019-07-23 MED FILL — MONTELUKAST SOD 10 MG TAB: 10 | 30 days supply | Qty: 30 | Fill #4

## 2019-08-04 ENCOUNTER — Other Ambulatory Visit: Payer: Self-pay

## 2019-08-04 ENCOUNTER — Encounter: Payer: Self-pay | Admitting: Advanced Practice Midwife

## 2019-08-04 ENCOUNTER — Ambulatory Visit (INDEPENDENT_AMBULATORY_CARE_PROVIDER_SITE_OTHER): Payer: 59 | Admitting: Advanced Practice Midwife

## 2019-08-04 DIAGNOSIS — Z1389 Encounter for screening for other disorder: Secondary | ICD-10-CM | POA: Diagnosis not present

## 2019-08-04 NOTE — Patient Instructions (Signed)

## 2019-08-04 NOTE — Progress Notes (Signed)
Tammy Chapman is a 28 y.o. who presents for a postpartum visit. She is 4 weeks postpartum following a low cervical transverse Cesarean section. I have fully reviewed the prenatal and intrapartum course. The delivery was at 82 gestational weeks.  Anesthesia: spinal. Postpartum course has been uneventful. Baby's course has been uneventful. Baby is feeding by bottle. Bleeding: no bleeding. Bowel function is normal. Bladder function is normal. Patient is not sexually active. Contraception method is abstinence. Postpartum depression screening: negative.   Current Outpatient Medications:  .  acetaminophen (TYLENOL) 500 MG tablet, Take 2 tablets (1,000 mg total) by mouth every 8 (eight) hours as needed for mild pain., Disp: 30 tablet, Rfl: 0 .  levocetirizine (XYZAL) 5 MG tablet, Take 1 tablet (5 mg total) by mouth every evening., Disp: 30 tablet, Rfl: 5 .  montelukast (SINGULAIR) 10 MG tablet, Take 1 tablet (10 mg total) by mouth at bedtime., Disp: 30 tablet, Rfl: 11 .  PROAIR HFA 108 (90 Base) MCG/ACT inhaler, Inhale 2 puffs into the lungs every 4 (four) hours as needed for wheezing or shortness of breath., Disp: 54 g, Rfl: 1 .  SYMBICORT 160-4.5 MCG/ACT inhaler, Inhale 2 puffs into the lungs 2 (two) times daily. , Disp: , Rfl: 12 .  HYDROmorphone (DILAUDID) 2 MG tablet, Take 1 tablet (2 mg total) by mouth every 4 (four) hours as needed for severe pain. (Patient not taking: Reported on 08/04/2019), Disp: 12 tablet, Rfl: 0 .  ibuprofen (ADVIL) 600 MG tablet, Take 1 tablet (600 mg total) by mouth every 6 (six) hours as needed for moderate pain. (Patient not taking: Reported on 08/04/2019), Disp: 30 tablet, Rfl: 0  Review of Systems   Constitutional: Negative for fever and chills Eyes: Negative for visual disturbances Respiratory: Negative for shortness of breath, dyspnea Cardiovascular: Negative for chest pain or palpitations  Gastrointestinal: Negative for vomiting, diarrhea and  constipation Genitourinary: Negative for dysuria and urgency Musculoskeletal: Negative for back pain, joint pain, myalgias  Neurological: Negative for dizziness and headaches    Objective:     Vitals:   08/04/19 1104  BP: 107/83  Pulse: 79   General:  alert, cooperative and no distress   Breasts:  negative  Lungs: Normal respiratory effort  Heart:  regular rate and rhythm  Abdomen: Soft, nontender, well healed   Vulva:  normal  Vagina: normal vagina  Cervix:  closed  Corpus: Well involuted     Rectal Exam: no hemorrhoids        Assessment:    normal postpartum exam.  Plan:   1. Contraception: plans paragard. No sex until then 2. Follow up in:  1-2 weeks or as needed.

## 2019-08-05 MED FILL — LEVOCETIRIZINE 5 MG TABLET: 5 | 30 days supply | Qty: 30 | Fill #2

## 2019-08-10 MED FILL — SYMBICORT 160-4.5 MCG INH: 160-4.5 | 30 days supply | Qty: 10 | Fill #5

## 2019-08-15 ENCOUNTER — Other Ambulatory Visit: Payer: Self-pay

## 2019-08-15 ENCOUNTER — Ambulatory Visit (INDEPENDENT_AMBULATORY_CARE_PROVIDER_SITE_OTHER): Payer: 59 | Admitting: Emergency Medicine

## 2019-08-15 ENCOUNTER — Encounter: Payer: Self-pay | Admitting: Emergency Medicine

## 2019-08-15 VITALS — BP 115/88 | HR 62 | Temp 98.2°F | Ht 70.0 in | Wt 200.8 lb

## 2019-08-15 DIAGNOSIS — J4540 Moderate persistent asthma, uncomplicated: Secondary | ICD-10-CM | POA: Diagnosis not present

## 2019-08-15 DIAGNOSIS — J45909 Unspecified asthma, uncomplicated: Secondary | ICD-10-CM | POA: Diagnosis not present

## 2019-08-15 NOTE — Patient Instructions (Addendum)
We will perform blood work today. Please continue your Symbicort 2 puffs twice a day.  Rinse and gargle after using. Keep your albuterol available to use 2 puffs if needed for short of breath, chest tightness, wheezing. Please continue Singulair and Xyzal You can stop Nexium.  Please pay attention to whether stopping this medication negatively impact your breathing or your coughing. Follow with Dr Lamonte Sakai in 1 month or next available.

## 2019-08-15 NOTE — Assessment & Plan Note (Signed)
Continues to have poor control, frequent symptoms, frequent albuterol use.  It appears that she is principally bothered by dry cough, chest tightness that seem to respond to the albuterol.  I had hoped that her pregnancy was contributing to her poor control.  Her breathing is better and that she is no longer experiencing restrictive disease but her asthma symptoms and cough sound about the same.  She is not experiencing any GERD, has been taking scheduled PPI empirically.  She notes that she has eustachian tube defect and chronic ear infections even in the setting of tubes.  This may be a contributor to her symptoms as well.  I believe she needs eosinophil testing, IgE testing to allow [place] to consider possible immune therapy.  We will check her blood work today.  Okay to stop the Nexium.  She will keep track of whether her symptoms decompensate off this medication.  We will perform blood work today. Please continue your Symbicort 2 puffs twice a day.  Rinse and gargle after using. Keep your albuterol available to use 2 puffs if needed for short of breath, chest tightness, wheezing. Please continue Singulair and Xyzal You can stop Nexium.  Please pay attention to whether stopping this medication negatively impact your breathing or your coughing. Follow with Dr Lamonte Sakai in 1 month or next available.

## 2019-08-15 NOTE — Progress Notes (Signed)
Subjective:    Patient ID: Tammy Chapman, female    DOB: April 15, 1991, 28 y.o.   MRN: [id number]   HPI  ROV 06/06/2019 --  28 year old woman with a history of moderate persistent asthma, chronic rhinitis and allergies, chronic cough.  She is currently pregnant, 36 weeks.  We have been managing her with Symbicort, albuterol as needed which she uses approximately.  Her potential exacerbate her's were treated with Singulair, loratadine, Nexium.  She reports today that she had preterm labor at 31 weeks, had ? Placental abruption. She had 8 days of bedrest, was able to go home. Planning for c-section mid-November. Her cough has increased - was treated empirically by Dr Luan Pulling + steroids late September.  Using symbicort, singulair. She has increased nasal congestion, sore throat in the am. She remains on the nexium. She is on light duty at work. She is now using tussionex per her OB.   ROV 08/15/2019 --28 year old woman with moderate persistent asthma, chronic rhinitis and cough.  She had progressive worsening of her symptoms while she was pregnant.  She had her baby 11/13.  She had been managed on Symbicort, Singulair, loratadine, Nexium.  Today she reports that her breathing is better, less restrictive disease. She is still using albuterol frequently. Uses it for cough and chest tightness. She is not feeling any reflux, has continued the nexium. She has minimal nasal gtt or congestion. She has frequent ear infections, frequent abx use for this. Remains on symbicort - she believes she benefits. Has used her albuterol 1x today.    Review of Systems  Constitutional: Negative for fever and unexpected weight change.  HENT: Negative for congestion, dental problem, ear pain, nosebleeds, postnasal drip, rhinorrhea, sinus pressure, sneezing, sore throat and trouble swallowing.   Eyes: Negative for redness and itching.  Respiratory: Positive for cough. Negative for chest tightness, shortness of breath and  wheezing.   Cardiovascular: Negative for palpitations and leg swelling.  Gastrointestinal: Negative for nausea and vomiting.  Genitourinary: Negative for dysuria.  Musculoskeletal: Negative for joint swelling.  Skin: Negative for rash.  Neurological: Negative for headaches.  Hematological: Does not bruise/bleed easily.  Psychiatric/Behavioral: Negative for dysphoric mood. The patient is not nervous/anxious.         Objective:   Physical Exam Today's Vitals   08/15/19 1605  BP: 115/88  Pulse: 62  Temp: 98.2 F (36.8 C)  TempSrc: Temporal  SpO2: 97%  Weight: 200 lb 12.8 oz (91.1 kg)  Height: 5\' 10"  (1.778 m)   Body mass index is 28.81 kg/m.   Gen: Pleasant, well-nourished, in no distress,  normal affect  ENT: No lesions,  mouth clear,  oropharynx clear, no postnasal drip  Neck: No JVD, no UA noise  Lungs: No use of accessory muscles, no crackles or wheezing on normal respiration, no wheeze on forced expiration  Cardiovascular: RRR, heart sounds normal, no murmur or gallops, no peripheral edema  Musculoskeletal: No deformities, no cyanosis or clubbing  Neuro: alert, awake, non focal  Skin: Warm, no lesions or rash     Assessment & Plan:  Chronic asthma Continues to have poor control, frequent symptoms, frequent albuterol use.  It appears that she is principally bothered by dry cough, chest tightness that seem to respond to the albuterol.  I had hoped that her pregnancy was contributing to her poor control.  Her breathing is better and that she is no longer experiencing restrictive disease but her asthma symptoms and cough sound about the same.  She is not experiencing any GERD, has been taking scheduled PPI empirically.  She notes that she has eustachian tube defect and chronic ear infections even in the setting of tubes.  This may be a contributor to her symptoms as well.  I believe she needs eosinophil testing, IgE testing to allow [place] to consider possible immune  therapy.  We will check her blood work today.  Okay to stop the Nexium.  She will keep track of whether her symptoms decompensate off this medication.  We will perform blood work today. Please continue your Symbicort 2 puffs twice a day.  Rinse and gargle after using. Keep your albuterol available to use 2 puffs if needed for short of breath, chest tightness, wheezing. Please continue Singulair and Xyzal You can stop Nexium.  Please pay attention to whether stopping this medication negatively impact your breathing or your coughing. Follow with Dr Lamonte Sakai in 1 month or next available.      Baltazar Apo, MD, PhD 08/15/2019, 5:43 PM [facility] [phone] or if no answer [phone]

## 2019-08-16 ENCOUNTER — Encounter: Payer: Self-pay | Admitting: Women's Health

## 2019-08-16 ENCOUNTER — Ambulatory Visit (INDEPENDENT_AMBULATORY_CARE_PROVIDER_SITE_OTHER): Payer: 59 | Admitting: Women's Health

## 2019-08-16 ENCOUNTER — Other Ambulatory Visit: Payer: Self-pay

## 2019-08-16 VITALS — BP 113/74 | HR 75 | Ht 64.0 in | Wt 202.2 lb

## 2019-08-16 DIAGNOSIS — Z30018 Encounter for initial prescription of other contraceptives: Secondary | ICD-10-CM | POA: Diagnosis not present

## 2019-08-16 DIAGNOSIS — Z30430 Encounter for insertion of intrauterine contraceptive device: Secondary | ICD-10-CM | POA: Diagnosis not present

## 2019-08-16 DIAGNOSIS — Z30432 Encounter for removal of intrauterine contraceptive device: Secondary | ICD-10-CM

## 2019-08-16 DIAGNOSIS — Z8759 Personal history of other complications of pregnancy, childbirth and the puerperium: Secondary | ICD-10-CM

## 2019-08-16 DIAGNOSIS — Z3202 Encounter for pregnancy test, result negative: Secondary | ICD-10-CM

## 2019-08-16 LAB — CBC WITH DIFFERENTIAL/PLATELET
Basophils Absolute: 0 10*3/uL (ref 0.0–0.1)
Basophils Relative: 0.8 % (ref 0.0–3.0)
Eosinophils Absolute: 0.1 10*3/uL (ref 0.0–0.7)
Eosinophils Relative: 1.6 % (ref 0.0–5.0)
HCT: 38.3 % (ref 36.0–46.0)
Hemoglobin: 12.1 g/dL (ref 12.0–15.0)
Lymphocytes Relative: 39.5 % (ref 12.0–46.0)
Lymphs Abs: 1.9 10*3/uL (ref 0.7–4.0)
MCHC: 31.6 g/dL (ref 30.0–36.0)
MCV: 76.6 fl — ABNORMAL LOW (ref 78.0–100.0)
Monocytes Absolute: 0.4 10*3/uL (ref 0.1–1.0)
Monocytes Relative: 8.3 % (ref 3.0–12.0)
Neutro Abs: 2.4 10*3/uL (ref 1.4–7.7)
Neutrophils Relative %: 49.8 % (ref 43.0–77.0)
Platelets: 245 10*3/uL (ref 150.0–400.0)
RBC: 5 Mil/uL (ref 3.87–5.11)
RDW: 19.3 % — ABNORMAL HIGH (ref 11.5–15.5)
WBC: 4.8 10*3/uL (ref 4.0–10.5)

## 2019-08-16 LAB — IGE: IgE (Immunoglobulin E), Serum: 191 kU/L — ABNORMAL HIGH (ref <=114)

## 2019-08-16 LAB — POCT URINE PREGNANCY: Preg Test, Ur: NEGATIVE

## 2019-08-16 MED ORDER — PHEXXI 1.8-1-0.4 % VA GEL: 1.0000 | VAGINAL | 11 refills | Status: DC | PRN

## 2019-08-16 MED ORDER — PARAGARD INTRAUTERINE COPPER IU IUD: INTRAUTERINE_SYSTEM | Freq: Once | INTRAUTERINE | Status: AC

## 2019-08-16 NOTE — Patient Instructions (Signed)
 Nothing in vagina for 3 days (no sex, douching, tampons, etc...)  Check your strings once a month to make sure you can feel them, if you are not able to please let us know  If you develop a fever of 100.4 or more in the next few weeks, or if you develop severe abdominal pain, please let us know  Use a backup method of birth control, such as condoms, for 2 weeks     Intrauterine Device Insertion, Care After  This sheet gives you information about how to care for yourself after your procedure. Your health care provider may also give you more specific instructions. If you have problems or questions, contact your health care provider. What can I expect after the procedure? After the procedure, it is common to have:  Cramps and pain in the abdomen.  Light bleeding (spotting) or heavier bleeding that is like your menstrual period. This may last for up to a few days.  Lower back pain.  Dizziness.  Headaches.  Nausea. Follow these instructions at home:  Before resuming sexual activity, check to make sure that you can feel the IUD string(s). You should be able to feel the end of the string(s) below the opening of your cervix. If your IUD string is in place, you may resume sexual activity. ? If you had a hormonal IUD inserted more than 7 days after your most recent period started, you will need to use a backup method of birth control for 7 days after IUD insertion. Ask your health care provider whether this applies to you.  Continue to check that the IUD is still in place by feeling for the string(s) after every menstrual period, or once a month.  Take over-the-counter and prescription medicines only as told by your health care provider.  Do not drive or use heavy machinery while taking prescription pain medicine.  Keep all follow-up visits as told by your health care provider. This is important. Contact a health care provider if:  You have bleeding that is heavier or lasts longer  than a normal menstrual cycle.  You have a fever.  You have cramps or abdominal pain that get worse or do not get better with medicine.  You develop abdominal pain that is new or is not in the same area of earlier cramping and pain.  You feel lightheaded or weak.  You have abnormal or bad-smelling discharge from your vagina.  You have pain during sexual activity.  You have any of the following problems with your IUD string(s): ? The string bothers or hurts you or your sexual partner. ? You cannot feel the string. ? The string has gotten longer.  You can feel the IUD in your vagina.  You think you may be pregnant, or you miss your menstrual period.  You think you may have an STI (sexually transmitted infection). Get help right away if:  You have flu-like symptoms.  You have a fever and chills.  You can feel that your IUD has slipped out of place. Summary  After the procedure, it is common to have cramps and pain in the abdomen. It is also common to have light bleeding (spotting) or heavier bleeding that is like your menstrual period.  Continue to check that the IUD is still in place by feeling for the string(s) after every menstrual period, or once a month.  Keep all follow-up visits as told by your health care provider. This is important.  Contact your health care provider   you have problems with your IUD string(s), such as the string getting longer or bothering you or your sexual partner. This information is not intended to replace advice given to you by your health care provider. Make sure you discuss any questions you have with your health care provider. Document Released: 04/02/2011 Document Revised: 07/17/2017 Document Reviewed: 06/25/2016 Elsevier Patient Education  2020 [employer].

## 2019-08-16 NOTE — Progress Notes (Signed)
   IUD INSERTION Patient name: NKAUJ MASKER MRN [id number]  Date of birth: [birth year] Subjective Findings:   SKYLIA ANGELICA is a 28 y.o. G42P2002 Caucasian female 6wks s/p RCS being seen today for insertion of a Paragard IUD.   Patient's last menstrual period was 08/09/2019. Last sexual intercourse was: not since period ended Last pap10/18/17. Results were:  normal  The risks and benefits of the method and placement have been thouroughly reviewed with the patient and all questions were answered.  Specifically the patient is aware of failure rate of 08/998, expulsion of the IUD and of possible perforation.  The patient is aware of irregular bleeding due to the method and understands the incidence of irregular bleeding diminishes with time.  Signed copy of informed consent in chart.  Pertinent History Reviewed:   Reviewed past medical,surgical, social, obstetrical and family history.  Reviewed problem list, medications and allergies. Objective Findings & Procedure:   Vitals:   08/16/19 0838  BP: 113/74  Pulse: 75  Weight: 202 lb 3.2 oz (91.7 kg)  Height: 5\' 4"  (1.626 m)  Body mass index is 34.71 kg/m.  Results for orders placed or performed in visit on 08/16/19 (from the past 24 hour(s))  POCT urine pregnancy   Collection Time: 08/16/19  8:42 AM  Result Value Ref Range   Preg Test, Ur Negative Negative     Time out was performed.  A graves speculum was placed in the vagina.  The cervix was visualized, prepped using Betadine, and grasped with a single tooth tenaculum. The uterus was found to be neutral and it sounded to 7 cm.  Paragard  IUD placed per manufacturer's recommendations. The strings were trimmed to approximately 3 cm. The patient tolerated the procedure well.   Informal transvaginal sonogram was performed and IUD appears to be low in uterus, possibly through c/s incision, confirmed by [employer], ultrasonographer  IUD easily removed  Discussed options of trying again, had  front office check- they couldn't get any clear answers if would be covered. Pt wants non-hormonal, discussed option of Phexxi. Pt wants to try this for now, if she decides she wants to try IUD again will call and let us know, schedule w/ JVF.   Chaperone: Peggy Dones   Assessment & Plan:   1) Unsuccessful Paragard IUD insertion > went through c/s incision, removed. No sex x 7d.   2) Contraception management> rx Phexxi, gave coupon card, discussed proper use. Let us know if decides to try IUD again, no sex x 14d prior or schedule for when on period w/ JVF.   3) Past due for pap>schedule  Orders Placed This Encounter  Procedures  . POCT urine pregnancy    Return in about 4 weeks (around 09/13/2019) for Pap & physical.  Roma Schanz CNM, [facility] 08/16/2019 10:17 AM

## 2019-08-23 ENCOUNTER — Encounter: Payer: Self-pay | Admitting: *Deleted

## 2019-08-24 MED FILL — MONTELUKAST SOD 10 MG TAB: 10 | 30 days supply | Qty: 30 | Fill #5

## 2019-09-06 MED FILL — LEVOCETIRIZINE 5 MG TABLET: 5 | 30 days supply | Qty: 30 | Fill #3

## 2019-09-15 ENCOUNTER — Encounter: Payer: Self-pay | Admitting: Emergency Medicine

## 2019-09-15 ENCOUNTER — Other Ambulatory Visit (HOSPITAL_COMMUNITY)
Admission: RE | Admit: 2019-09-15 | Discharge: 2019-09-15 | Disposition: A | Discharge status: Home / Self Care | Payer: No Typology Code available for payment source | Source: Ambulatory Visit | Attending: Advanced Practice Midwife | Admitting: Advanced Practice Midwife

## 2019-09-15 ENCOUNTER — Ambulatory Visit (INDEPENDENT_AMBULATORY_CARE_PROVIDER_SITE_OTHER): Payer: No Typology Code available for payment source | Admitting: Advanced Practice Midwife

## 2019-09-15 ENCOUNTER — Ambulatory Visit: Payer: No Typology Code available for payment source | Admitting: Emergency Medicine

## 2019-09-15 ENCOUNTER — Encounter: Payer: Self-pay | Admitting: Advanced Practice Midwife

## 2019-09-15 ENCOUNTER — Other Ambulatory Visit: Payer: Self-pay

## 2019-09-15 VITALS — BP 118/66 | HR 75 | Ht 70.0 in

## 2019-09-15 DIAGNOSIS — K219 Gastro-esophageal reflux disease without esophagitis: Secondary | ICD-10-CM

## 2019-09-15 DIAGNOSIS — J4540 Moderate persistent asthma, uncomplicated: Secondary | ICD-10-CM | POA: Diagnosis not present

## 2019-09-15 DIAGNOSIS — Z01419 Encounter for gynecological examination (general) (routine) without abnormal findings: Secondary | ICD-10-CM | POA: Diagnosis present

## 2019-09-15 DIAGNOSIS — J309 Allergic rhinitis, unspecified: Secondary | ICD-10-CM | POA: Insufficient documentation

## 2019-09-15 DIAGNOSIS — J301 Allergic rhinitis due to pollen: Secondary | ICD-10-CM

## 2019-09-15 NOTE — Patient Instructions (Addendum)
Please continue your Symbicort 2 puffs twice a day.  Rinse and gargle after you use this. Keep your albuterol available to use 2 puffs if needed for shortness of breath, chest tightness, wheezing. Please continue Singulair and loratadine as you have been taking them. We will make a referral for you to start Xolair injections Follow with Dr Lamonte Sakai in 6 weeks or sooner if you have any problems

## 2019-09-15 NOTE — Assessment & Plan Note (Signed)
Improved, tolerating being off PPI. Do note nocturnal awakenings, ? Occult reflux

## 2019-09-15 NOTE — Assessment & Plan Note (Signed)
She still has daily symptoms on Symbicort and Singulair.  I had hoped that after delivery of her child in November that she would return to baseline, this has not happened.  She has an allergic phenotype, multiple allergies on skin testing, immunotherapy was not initiated.  We may need to pursue this going forward.  For now I think it would potentially be beneficial to start Xolair, see if she responds.  Her IgE was 191.  I will make the referral today.  We will continue her current allergy regimen, I do not think she needs to restart her PPI, GERD is improved since the delivery of her child.  Please continue your Symbicort 2 puffs twice a day.  Rinse and gargle after you use this. Keep your albuterol available to use 2 puffs if needed for shortness of breath, chest tightness, wheezing. Please continue Singulair and loratadine as you have been taking them. We will make a referral for you to start Xolair injections Follow with Dr Lamonte Sakai in 6 weeks or sooner if you have any problems

## 2019-09-15 NOTE — Progress Notes (Signed)
Tammy Chapman 29 y.o.  Vitals:   09/15/19 1146  BP: 118/66  Pulse: 75    There were no vitals filed for this visit.  Past Medical History: Past Medical History:  Diagnosis Date  . Asthma   . Bronchitis, mucopurulent recurrent ([place]) 01/19/2019  . Chronic asthma 01/19/2019  . GERD (gastroesophageal reflux disease)   . Loose body of right knee 02/2013  . Mosquito bite 03/08/2013  . Pregnancy induced hypertension   . Recurrent otitis media of both ears 01/19/2019  . Sprain and strain of medial collateral ligament of knee 02/2013   right    Past Surgical History: Past Surgical History:  Procedure Laterality Date  . CESAREAN SECTION N/A 01/08/2017   Procedure: CESAREAN SECTION;  Surgeon: Jonnie Kind, MD;  Location: [facility];  Service: Obstetrics;  Laterality: N/A;  . CESAREAN SECTION N/A 07/01/2019   Procedure: REPEAT CESAREAN SECTION;  Surgeon: Jonnie Kind, MD;  Location: [facility];  Service: Obstetrics;  Laterality: N/A;  . CHOLECYSTECTOMY  01/20/2008  . EAR TUBE REMOVAL  2002  . ESOPHAGOGASTRODUODENOSCOPY  05/15/2011   Procedure: ESOPHAGOGASTRODUODENOSCOPY (EGD);  Surgeon: Rogene Houston, MD;  Location: [facility];  Service: Endoscopy;  Laterality: N/A;  3:15   . KNEE ARTHROSCOPY Right 03/15/2013   Procedure: RIGHT  KNEE ARTHROSCOPY, FEMORAL PATELLA REEFING, LATERAL RELEASE, PARTIAL LATERAL MENISCECTOMY ;  Surgeon: Lorn Junes, MD;  Location: [facility];  Service: Orthopedics;  Laterality: Right;  . TONSILLECTOMY AND ADENOIDECTOMY  1999  . TYMPANOSTOMY TUBE PLACEMENT  1999, 2011    Family History: Family History  Problem Relation Age of Onset  . Diabetes Mother   . Cancer Paternal Grandfather        renal  . Diabetes Maternal Grandfather   . COPD Maternal Grandfather   . Congestive Heart Failure Maternal Grandfather   . Other Daughter        milk and soy allergy    Social History: Social History   Tobacco Use  . Smoking status: Never  Smoker  . Smokeless tobacco: Never Used  Substance Use Topics  . Alcohol use: No  . Drug use: No    Allergies:  Allergies  Allergen Reactions  . Benadryl [Diphenhydramine Hcl] Shortness Of Breath    Pt states that she feels fine if she sits down, but if she is moving around after taking benadryl, she feels SOB  . Penicillins Shortness Of Breath    Has patient had a PCN reaction causing immediate rash, facial/tongue/throat swelling, SOB or lightheadedness with hypotension: yes Has patient had a PCN reaction causing severe rash involving mucus membranes or skin necrosis: no Has patient had a PCN reaction that required hospitalization: no Has patient had a PCN reaction occurring within the last 10 years: no If all of the above answers are "NO", then may proceed with Cephalosporin use. Patient reports she CAN TAKE kEFLEX AND SUPRAX  . Adhesive [Tape] Hives  . Percocet [Oxycodone-Acetaminophen]   . Lactose Intolerance (Gi) Diarrhea and Nausea And Vomiting  . Latex Rash  . Sulfa Antibiotics Rash  . Ultram [Tramadol Hcl] Rash      Current Outpatient Medications:  .  levocetirizine (XYZAL) 5 MG tablet, Take 1 tablet (5 mg total) by mouth every evening., Disp: 30 tablet, Rfl: 5 .  montelukast (SINGULAIR) 10 MG tablet, Take 1 tablet (10 mg total) by mouth at bedtime., Disp: 30 tablet, Rfl: 11 .  PROAIR HFA 108 (90 Base) MCG/ACT inhaler,  Inhale 2 puffs into the lungs every 4 (four) hours as needed for wheezing or shortness of breath., Disp: 54 g, Rfl: 1 .  SYMBICORT 160-4.5 MCG/ACT inhaler, Inhale 2 puffs into the lungs 2 (two) times daily. , Disp: , Rfl: 12 .  Lactic Ac-Citric Ac-Pot Bitart (PHEXXI) 1.8-1-0.4 % GEL, Place 1 Applicatorful vaginally as needed. Immediately before or up to 1 hour before sex (Patient not taking: Reported on 09/15/2019), Disp: 5 g, Rfl: 11  History of Present Illness: Here for pap and  Physical.  Last pap 10/17, normal.  Had paragard placed 12/29, immediate [place]  afterwards revealed it had gone through CS scar, so removed.  Pt wanted non hormonal , so given coupon card for Phexxi.  Has had insurance problems getting it, pharmacy supposed to fax PA today.  Review of Systems   Patient denies any headaches, blurred vision, shortness of breath, chest pain, abdominal pain, problems with bowel movements, urination, or intercourse.   Physical Exam: General:  Well developed, well nourished, no acute distress Skin:  Warm and dry Neck:  Midline trachea, normal thyroid Lungs; Clear to auscultation bilaterally Breast:  No dominant palpable mass, retraction, or nipple discharge Cardiovascular: Regular rate and rhythm Abdomen:  Soft, non tender, no hepatosplenomegaly Pelvic:  External genitalia is normal in appearance.  The vagina is normal in appearance.  The cervix is bulbous.  Uterus is felt to be normal size, shape, and contour.  No adnexal masses or tenderness noted.  Extremities:  No swelling or varicosities noted Psych:  No mood changes.     Impression: normal GYN exam     Plan: if pap normal, repeat q 3 years.  Let us know how the Phexxi goes.

## 2019-09-15 NOTE — Progress Notes (Signed)
Subjective:    Patient ID: AOLANI WAS, female    DOB: 1990/11/05, 29 y.o.   MRN: [id number]   HPI  ROV 06/06/2019 --  29 year old woman with a history of moderate persistent asthma, chronic rhinitis and allergies, chronic cough.  She is currently pregnant, 36 weeks.  We have been managing her with Symbicort, albuterol as needed which she uses approximately.  Her potential exacerbate her's were treated with Singulair, loratadine, Nexium.  She reports today that she had preterm labor at 31 weeks, had ? Placental abruption. She had 8 days of bedrest, was able to go home. Planning for c-section mid-November. Her cough has increased - was treated empirically by Dr Luan Pulling + steroids late September.  Using symbicort, singulair. She has increased nasal congestion, sore throat in the am. She remains on the nexium. She is on light duty at work. She is now using tussionex per her OB.   ROV 08/15/2019 --29 year old woman with moderate persistent asthma, chronic rhinitis and cough.  She had progressive worsening of her symptoms while she was pregnant.  She had her baby 11/13.  She had been managed on Symbicort, Singulair, loratadine, Nexium.  Today she reports that her breathing is better, less restrictive disease. She is still using albuterol frequently. Uses it for cough and chest tightness. She is not feeling any reflux, has continued the nexium. She has minimal nasal gtt or congestion. She has frequent ear infections, frequent abx use for this. Remains on symbicort - she believes she benefits. Has used her albuterol 1x today.   ROV 09/15/2019 --follow-up visit for 29 year old woman with moderate persistent asthma, some upper airway lability with contributions from GERD, allergic rhinitis.  She continued to have frequent albuterol use at her last visit.  We were able to stop her Nexium.  Labs 12/28: IgE elevated at 191, absolute eosinophil count 100. Today she reports that she may be slightly better than  last time, a bit less cough. Still needs her albuterol afew times a day, most often at work.  She is experiencing occasional nocturnal awakenings, requires her albuterol at those times.  No GERD symptoms most days, off PPI. She has multiple allergies documented by skin testing, immunotherapy was not recommended.    Review of Systems  Constitutional: Negative for fever and unexpected weight change.  HENT: Negative for congestion, dental problem, ear pain, nosebleeds, postnasal drip, rhinorrhea, sinus pressure, sneezing, sore throat and trouble swallowing.   Eyes: Negative for redness and itching.  Respiratory: Positive for cough, shortness of breath and wheezing. Negative for chest tightness.   Cardiovascular: Negative for palpitations and leg swelling.  Gastrointestinal: Negative for nausea and vomiting.  Genitourinary: Negative for dysuria.  Musculoskeletal: Negative for joint swelling.  Skin: Negative for rash.  Neurological: Negative for headaches.  Hematological: Does not bruise/bleed easily.  Psychiatric/Behavioral: Negative for dysphoric mood. The patient is not nervous/anxious.         Objective:   Physical Exam Today's Vitals   09/15/19 0925  BP: 110/76  Pulse: (!) 56  Temp: 98.2 F (36.8 C)  TempSrc: Temporal  SpO2: 100%  Weight: 201 lb 6.4 oz (91.4 kg)  Height: 5\' 4"  (1.626 m)   Body mass index is 34.57 kg/m.   Gen: Pleasant, well-nourished, in no distress,  normal affect  ENT: No lesions,  mouth clear,  oropharynx clear, no postnasal drip  Neck: No JVD, no UA noise  Lungs: No use of accessory muscles, no crackles or wheezing on normal respiration, no  wheeze on forced expiration  Cardiovascular: RRR, heart sounds normal, no murmur or gallops, no peripheral edema  Musculoskeletal: No deformities, no cyanosis or clubbing  Neuro: alert, awake, non focal  Skin: Warm, no lesions or rash     Assessment & Plan:  Chronic asthma She still has daily symptoms on  Symbicort and Singulair.  I had hoped that after delivery of her child in November that she would return to baseline, this has not happened.  She has an allergic phenotype, multiple allergies on skin testing, immunotherapy was not initiated.  We may need to pursue this going forward.  For now I think it would potentially be beneficial to start Xolair, see if she responds.  Her IgE was 191.  I will make the referral today.  We will continue her current allergy regimen, I do not think she needs to restart her PPI, GERD is improved since the delivery of her child.  Please continue your Symbicort 2 puffs twice a day.  Rinse and gargle after you use this. Keep your albuterol available to use 2 puffs if needed for shortness of breath, chest tightness, wheezing. Please continue Singulair and loratadine as you have been taking them. We will make a referral for you to start Xolair injections Follow with Dr Lamonte Sakai in 6 weeks or sooner if you have any problems  GERD (gastroesophageal reflux disease) Improved, tolerating being off PPI. Do note nocturnal awakenings, ? Occult reflux  Allergic rhinitis Continue Singulair, loratadine.  We may need to refer her back to allergist to reconsider immunotherapy.  I'd like to wait to see how she responds to [place] first.   Baltazar Apo, MD, PhD 09/15/2019, 10:09 AM [facility] [phone] or if no answer [phone]

## 2019-09-15 NOTE — Assessment & Plan Note (Signed)
Continue Singulair, loratadine.  We may need to refer her back to allergist to reconsider immunotherapy.  I'd like to wait to see how she responds to [place] first.

## 2019-09-16 ENCOUNTER — Telehealth: Payer: Self-pay | Admitting: Emergency Medicine

## 2019-09-16 NOTE — Telephone Encounter (Signed)
Enrollment forms for Xolair were brought to me by Benetta Spar, CMA. Forms and insurance cards have been faxed to [employer]. Will await summary of benefits.

## 2019-09-17 MED FILL — SYMBICORT 160-4.5 MCG INH: 160-4.5 | 30 days supply | Qty: 10 | Fill #6

## 2019-09-19 ENCOUNTER — Encounter: Payer: Self-pay | Admitting: *Deleted

## 2019-09-19 LAB — CYTOLOGY - PAP
Adequacy: ABSENT
Comment: NEGATIVE
Diagnosis: NEGATIVE
High risk HPV: NEGATIVE

## 2019-09-19 MED FILL — PHEXXI 1.8-1-0.4 % GEL: 1.8-1-0.4 | 12 days supply | Qty: 60 | Fill #0

## 2019-09-20 MED FILL — MONTELUKAST SOD 10 MG TAB: 10 | 30 days supply | Qty: 30 | Fill #6

## 2019-09-23 NOTE — Telephone Encounter (Signed)
Called [facility] and spoke with Dian Situ, to follow up on [facility] [place] enrollment. [facility] requested enrollment be faxed again, because it was not showing in there system. [place] enrollment faxed to [facility]. Will await summary of benefits.

## 2019-09-29 NOTE — Telephone Encounter (Signed)
Medication name and strength: Xolair 300mg  every 4 weeks Provider: Dr. Lamonte Sakai Patient insurance ID: [id number] Phone: [phone]  Was the PA started on CMM?  No If yes, please enter the Key: n/a Timeframe for approval/denial: 24-72 hours Discission via fax

## 2019-10-05 NOTE — Telephone Encounter (Signed)
Called to follow up on Xolair PA outcome. Placed on hold for 15 minutes.  Will follow up at a later time.

## 2019-10-09 MED FILL — LEVOCETIRIZINE 5 MG TABLET: 5 | 30 days supply | Qty: 30 | Fill #4

## 2019-10-11 NOTE — Telephone Encounter (Signed)
Called insurance company at [phone] to follow up on pt's PA. Was placed on a 10+ minute hold with no one coming to the line. Will follow up.

## 2019-10-12 NOTE — Telephone Encounter (Signed)
[employer] company again today to follow up on pt's PA for Xolair. Was advised that they are still needing additional information to process the PA. Clinical questions have been answered, determination could take 24-48 hours.

## 2019-10-17 ENCOUNTER — Ambulatory Visit (HOSPITAL_BASED_OUTPATIENT_CLINIC_OR_DEPARTMENT_OTHER): Payer: No Typology Code available for payment source | Admitting: Pharmacist

## 2019-10-17 ENCOUNTER — Other Ambulatory Visit: Payer: Self-pay

## 2019-10-17 DIAGNOSIS — Z7189 Other specified counseling: Secondary | ICD-10-CM

## 2019-10-17 MED ORDER — OMALIZUMAB 150 MG/ML ~~LOC~~ SOSY: 300.0000 mg | PREFILLED_SYRINGE | SUBCUTANEOUS | 12 refills | Status: DC

## 2019-10-17 NOTE — Progress Notes (Signed)
   S: Patient presents for review of their specialty medication therapy.  Patient is currently taking Xolair for asthma. Patient is managed by Dr. Lamonte Sakai for this.   Adherence: has not started  Efficacy: has not started  Dosing: Give subcutaneously. Can be dosed every 2 or 4 weeks based on baseline serum IgE levels and body weight.  Asthma: SubQ: 300 mg q4weeks  Dose adjustments: Renal: no dose adjustments  Hepatic: no dose adjustments  Toxicity: Severe hypersensitivity reaction or anaphylaxis: Discontinue treatment. Fever, arthralgia, and rash: Discontinue treatment if this constellation of symptoms occurs.  Drug-drug interactions: none   Monitoring: CV effects: has not yet started medication Eosinophilia and vasculitis: has not yet started medication Fever/arthralgia/rash: has not yet started medication Hypersensitivity/Anaphylaxis: has not yet started medication Malignant neoplasms: has not yet started medication  O:  Lab Results  Component Value Date   WBC 4.8 08/15/2019   HGB 12.1 08/15/2019   HCT 38.3 08/15/2019   MCV 76.6 (L) 08/15/2019   PLT 245.0 08/15/2019      Chemistry      Component Value Date/Time   NA 137 02/04/2019 0959   K 4.2 02/04/2019 0959   CL 100 02/04/2019 0959   CO2 22 02/04/2019 0959   BUN 10 02/04/2019 0959   CREATININE 0.66 07/02/2019 0602   CREATININE 0.54 01/19/2019 1012      Component Value Date/Time   CALCIUM 8.8 02/04/2019 0959   ALKPHOS 67 02/04/2019 0959   AST 6 02/04/2019 0959   ALT 8 02/04/2019 0959   BILITOT 0.6 02/04/2019 0959       A/P: 1. Medication review: patient currently prescribed Xolair for asthma. Reviewed the medication with the patient, including the following: Xolair, omalizumab, is a novel IgE blocker.  It appears to reduce rates of hospitalizations, ER visits and unscheduled physician visits due asthma exacerbations when added to standard therapy.  Studies also show a reduction in steroid requirements and  improvement in quality of life.  Patient educated on purpose, proper use and potential adverse effects of Xolair.  Following instruction patient verbalized understanding. Patient should always have an EpiPen readily available in the event of anaphylaxis. SubQ: For SubQ injection only; doses >150 mg should be divided over more than one injection site (eg, 225 mg or 300 mg administered as two injections, 375 mg administered as three injections); each injection site should be separated by ?1 inch. Do not inject into moles, scars, bruises, tender areas, or broken skin. Injections may take 5 to 10 seconds to administer (solution is slightly viscous). Administer only under direct medical supervision and observe patient for 2 hours after the first 3 injections and 30 minutes after subsequent injections Dellia Cloud 2015) or in accordance with individual institution policies and procedures.No recommendations for any changes at this time.    Benard Halsted, PharmD, [place] [phone]

## 2019-10-17 NOTE — Telephone Encounter (Signed)
Received fax from pt's insurance. PA has been approved through 10/13/2019 - 10/11/2020. Rx has been sent to [facility] per the PA approval letter.  LMTCB x1 for pt.

## 2019-10-18 ENCOUNTER — Other Ambulatory Visit: Payer: Self-pay | Admitting: Pharmacist

## 2019-10-18 MED ORDER — OMALIZUMAB 150 MG/ML ~~LOC~~ SOSY: 300.0000 mg | PREFILLED_SYRINGE | SUBCUTANEOUS | 12 refills | Status: DC

## 2019-10-18 MED ORDER — EPINEPHRINE 0.3 MG/0.3ML IJ SOAJ: 0.3000 mg | Freq: Once | INTRAMUSCULAR | 2 refills | Status: AC

## 2019-10-18 MED FILL — EPINEPHRINE 0.3 MG AUTO-INJ: 0.3 | 30 days supply | Qty: 2 | Fill #0

## 2019-10-18 NOTE — Telephone Encounter (Signed)
Pt sent in a MyChart message due to not being able to get through on our phone system. Below is the following message I sent the pt in regards to her Xolair. Tammy Chapman,  Sorry you haven't been able to get through on the phones. I wanted to let you know that we had gotten the Xolair approved through your insurance. Of course with our insurance you have to go through a [facility] pharmacy to get it. I sent the prescription to [facility], you should be able to pick the medication up from them and then bring it in to your first injection appointment (we have several other patients who do that). In regards to your first injection appointment, our office policy is that you have to monitored for 2 hours here in the office after the medication is administered; that's to make sure you do not have a severe allergic reaction to the medication. We also require for you to have an Epipen on hand in case it's necessary, this is just a precaution but it is something that has to be done. I will send in a prescription for the Epipen to [facility] for you. When would be a good time to schedule your first injection?  Will await pt's response to the above message. Epipen rx has been sent in.

## 2019-10-18 NOTE — Telephone Encounter (Signed)
LMTCB x2 for pt 

## 2019-10-19 MED FILL — XOLAIR 150 MG/ML SOSY: 150 | 28 days supply | Qty: 2 | Fill #0

## 2019-10-19 NOTE — Telephone Encounter (Signed)
Please see MyChart message from 10/18/2019. Pt has been scheduled for her first injection and is aware of our office policy when it comes to getting your first shot. Nothing further is needed.

## 2019-10-24 ENCOUNTER — Other Ambulatory Visit: Payer: Self-pay

## 2019-10-24 ENCOUNTER — Ambulatory Visit (INDEPENDENT_AMBULATORY_CARE_PROVIDER_SITE_OTHER): Payer: No Typology Code available for payment source

## 2019-10-24 DIAGNOSIS — J4540 Moderate persistent asthma, uncomplicated: Secondary | ICD-10-CM

## 2019-10-24 MED ORDER — OMALIZUMAB 150 MG/ML ~~LOC~~ SOSY
300.0000 mg | PREFILLED_SYRINGE | Freq: Once | SUBCUTANEOUS | Status: AC
  Administered 2019-10-24: 300 mg via SUBCUTANEOUS

## 2019-10-24 MED FILL — MONTELUKAST SOD 10 MG TAB: 10 | 30 days supply | Qty: 30 | Fill #7

## 2019-10-24 MED FILL — SYMBICORT 160-4.5 MCG INH: 160-4.5 | 30 days supply | Qty: 10 | Fill #7

## 2019-10-24 NOTE — Progress Notes (Signed)
Patient presented to the office today for first-time Xolair injection.  Primary Pulmonologist: Baltazar Apo MD Medication name: Xolair Strength: 300mg   Site(s): R Arm  Epi pen/Auvi-Q visible during appointment: Yes  Time of injection: 0900  Patient evaluated every 15-20 minutes per protocol x2 hours.  1st check: 0920 Evaluation: No Reaction  2nd check: 0940  Evaluation: No Reaction  3rd check: 1000  Evaluation: No Reaction  4th check: 1020   Evaluation: No Reaction  5th check: 1040  Evaluation: No Reaction  6th check: 1100  Evaluation: No Reaction

## 2019-11-07 ENCOUNTER — Other Ambulatory Visit: Payer: Self-pay

## 2019-11-07 ENCOUNTER — Encounter: Payer: Self-pay | Admitting: Emergency Medicine

## 2019-11-07 ENCOUNTER — Ambulatory Visit: Payer: No Typology Code available for payment source | Admitting: Emergency Medicine

## 2019-11-07 VITALS — BP 104/72 | HR 85 | Temp 98.0°F | Ht 70.0 in | Wt 196.6 lb

## 2019-11-07 DIAGNOSIS — J4540 Moderate persistent asthma, uncomplicated: Secondary | ICD-10-CM

## 2019-11-07 DIAGNOSIS — R059 Cough, unspecified: Secondary | ICD-10-CM

## 2019-11-07 DIAGNOSIS — R05 Cough: Secondary | ICD-10-CM | POA: Diagnosis not present

## 2019-11-07 MED ORDER — HYDROCOD POLST-CPM POLST ER 10-8 MG/5ML PO SUER: 5.0000 mL | Freq: Two times a day (BID) | ORAL | 0 refills | Status: DC | PRN

## 2019-11-07 MED ORDER — HYDROCOD POLST-CPM POLST ER 10-8 MG/5ML PO SUER: 5.0000 mL | Freq: Two times a day (BID) | ORAL | Status: DC

## 2019-11-07 NOTE — Progress Notes (Signed)
Subjective:    Patient ID: Tammy Chapman, female    DOB: 07-17-1991, 29 y.o.   MRN: [id number]   HPI  ROV 09/15/2019 --follow-up visit for 29 year old woman with moderate persistent asthma, some upper airway lability with contributions from GERD, allergic rhinitis.  She continued to have frequent albuterol use at her last visit.  We were able to stop her Nexium.  Labs 12/28: IgE elevated at 191, absolute eosinophil count 100. Today she reports that she may be slightly better than last time, a bit less cough. Still needs her albuterol afew times a day, most often at work.  She is experiencing occasional nocturnal awakenings, requires her albuterol at those times.  No GERD symptoms most days, off PPI. She has multiple allergies documented by skin testing, immunotherapy was not recommended.   ROV 11/07/19 --29 year old woman with moderate persistent asthma, soft upper airway irritation and lability with contributions from GERD and allergic rhinitis, multiple allergies documented by skin testing (immunotherapy not recommended).  She has an elevated IgE, and started Xolair beginning of the month, has had one dose. She is using albuterol about 1-2x a day. Maintained on symbicort, singulair, xyzal. She does not want covid vaccination. Flu shot and PNA vaccines UTD.  She complains of intermittent chest tightness especially when she is outside exposed to pollen.  She is having more cough.  No longer on reflux medication but no with breakthrough symptoms.  She requested Tussionex today   Review of Systems  Constitutional: Negative for fever and unexpected weight change.  HENT: Negative for congestion, dental problem, ear pain, nosebleeds, postnasal drip, rhinorrhea, sinus pressure, sneezing, sore throat and trouble swallowing.   Eyes: Negative for redness and itching.  Respiratory: Positive for cough, shortness of breath and wheezing. Negative for chest tightness.   Cardiovascular: Negative for palpitations  and leg swelling.  Gastrointestinal: Negative for nausea and vomiting.  Genitourinary: Negative for dysuria.  Musculoskeletal: Negative for joint swelling.  Skin: Negative for rash.  Neurological: Negative for headaches.  Hematological: Does not bruise/bleed easily.  Psychiatric/Behavioral: Negative for dysphoric mood. The patient is not nervous/anxious.         Objective:   Physical Exam Today's Vitals   11/07/19 0855  BP: 104/72  Pulse: 85  Temp: 98 F (36.7 C)  TempSrc: Temporal  SpO2: 99%  Weight: 196 lb 9.6 oz (89.2 kg)  Height: 5\' 10"  (1.778 m)   Body mass index is 28.21 kg/m.   Gen: Pleasant, well-nourished, in no distress,  normal affect  ENT: No lesions,  mouth clear,  oropharynx clear, no postnasal drip  Neck: No JVD, no UA noise  Lungs: No use of accessory muscles, no crackles or wheezing on normal respiration, no wheeze on forced expiration  Cardiovascular: RRR, heart sounds normal, no murmur or gallops, no peripheral edema  Musculoskeletal: No deformities, no cyanosis or clubbing  Neuro: alert, awake, non focal  Skin: Warm, no lesions or rash     Assessment & Plan:  Chronic asthma Symptoms are about the same.  She complains of chest tightness, dyspnea.  Using her albuterol once or twice a day.  She is only had 1 dose of Xolair and hopefully this will be effective going forward.  She complains of cough which I suspect is more upper airway in nature.  She has her chest and axilla feel this at least for now until we have her on a stable dose of Xolair.  Continue her maintenance medications as ordered  Continue your Symbicort  2 puffs twice daily.  Rinse and gargle after using. Keep your albuterol available to use 2 puffs if needed for shortness of breath, chest tightness, wheezing. Continue your Xolair injections as scheduled. Continue Xyzal, Singulair as you have been taking them.  Use Tussionex 5 cc up to every 12 hours if needed for cough  suppression. Follow with Dr Lamonte Sakai in 4 months or sooner if you have any problems.   Baltazar Apo, MD, PhD 11/07/2019, 9:17 AM [facility] [phone] or if no answer [phone]

## 2019-11-07 NOTE — Assessment & Plan Note (Signed)
Symptoms are about the same.  She complains of chest tightness, dyspnea.  Using her albuterol once or twice a day.  She is only had 1 dose of Xolair and hopefully this will be effective going forward.  She complains of cough which I suspect is more upper airway in nature.  She has her chest and axilla feel this at least for now until we have her on a stable dose of Xolair.  Continue her maintenance medications as ordered  Continue your Symbicort 2 puffs twice daily.  Rinse and gargle after using. Keep your albuterol available to use 2 puffs if needed for shortness of breath, chest tightness, wheezing. Continue your Xolair injections as scheduled. Continue Xyzal, Singulair as you have been taking them.  Use Tussionex 5 cc up to every 12 hours if needed for cough suppression. Follow with Dr Lamonte Sakai in 4 months or sooner if you have any problems.

## 2019-11-07 NOTE — Patient Instructions (Signed)
Continue your Symbicort 2 puffs twice daily.  Rinse and gargle after using. Keep your albuterol available to use 2 puffs if needed for shortness of breath, chest tightness, wheezing. Continue your Xolair injections as scheduled. Continue Xyzal, Singulair as you have been taking them.  Use Tussionex 5 cc up to every 12 hours if needed for cough suppression. Follow with Dr Lamonte Sakai in 4 months or sooner if you have any problems.

## 2019-11-08 ENCOUNTER — Ambulatory Visit: Payer: No Typology Code available for payment source | Admitting: Emergency Medicine

## 2019-11-09 MED FILL — HYDROCODONE-CHLORPHEN ER SU: 10-8 | 14 days supply | Qty: 140 | Fill #0

## 2019-11-09 MED FILL — LEVOCETIRIZINE 5 MG TABLET: 5 | 30 days supply | Qty: 30 | Fill #5

## 2019-11-10 ENCOUNTER — Other Ambulatory Visit: Payer: Self-pay | Admitting: Women's Health

## 2019-11-10 MED FILL — ALBUTEROL SULFATE HFA 108 (: 108 (90 BAS | 33 days supply | Qty: 36 | Fill #1

## 2019-11-11 ENCOUNTER — Other Ambulatory Visit: Payer: Self-pay | Admitting: *Deleted

## 2019-11-14 MED FILL — PHEXXI 1.8-1-0.4 % GEL: 1.8-1-0.4 | 12 days supply | Qty: 60 | Fill #0

## 2019-11-14 MED FILL — XOLAIR 150 MG/ML SOSY: 150 | 28 days supply | Qty: 2 | Fill #1

## 2019-11-22 ENCOUNTER — Ambulatory Visit (INDEPENDENT_AMBULATORY_CARE_PROVIDER_SITE_OTHER): Payer: No Typology Code available for payment source

## 2019-11-22 ENCOUNTER — Other Ambulatory Visit: Payer: Self-pay

## 2019-11-22 DIAGNOSIS — J4540 Moderate persistent asthma, uncomplicated: Secondary | ICD-10-CM | POA: Diagnosis not present

## 2019-11-22 MED ORDER — OMALIZUMAB 150 MG/ML ~~LOC~~ SOSY
300.0000 mg | PREFILLED_SYRINGE | Freq: Once | SUBCUTANEOUS | Status: AC
  Administered 2019-11-22: 300 mg via SUBCUTANEOUS

## 2019-11-22 MED FILL — MONTELUKAST SOD 10 MG TAB: 10 | 30 days supply | Qty: 30 | Fill #8

## 2019-11-22 MED FILL — SYMBICORT 160-4.5 MCG INH: 160-4.5 | 30 days supply | Qty: 10 | Fill #8

## 2019-11-22 NOTE — Progress Notes (Signed)
All questions were answered by the patient before medication was administered. Have you been hospitalized in the last 10 days? No Do you have a fever? No Do you have a cough? No Do you have a headache or sore throat? No  

## 2019-12-05 MED FILL — XOLAIR 150 MG/ML SOSY: 150 | 28 days supply | Qty: 2 | Fill #2

## 2019-12-08 ENCOUNTER — Other Ambulatory Visit: Payer: Self-pay

## 2019-12-08 ENCOUNTER — Ambulatory Visit (INDEPENDENT_AMBULATORY_CARE_PROVIDER_SITE_OTHER): Payer: No Typology Code available for payment source

## 2019-12-08 DIAGNOSIS — J4540 Moderate persistent asthma, uncomplicated: Secondary | ICD-10-CM

## 2019-12-08 MED ORDER — OMALIZUMAB 150 MG/ML ~~LOC~~ SOSY
300.0000 mg | PREFILLED_SYRINGE | Freq: Once | SUBCUTANEOUS | Status: AC
  Administered 2019-12-08: 300 mg via SUBCUTANEOUS

## 2019-12-08 NOTE — Progress Notes (Signed)
All questions were answered by the patient before medication was administered. Have you been hospitalized in the last 10 days? No Do you have a fever? No Do you have a cough? No Do you have a headache or sore throat? No  

## 2019-12-15 ENCOUNTER — Other Ambulatory Visit: Payer: Self-pay | Admitting: Emergency Medicine

## 2019-12-15 MED FILL — LEVOCETIRIZINE 5 MG TABLET: 5 | 30 days supply | Qty: 30 | Fill #0

## 2019-12-22 MED FILL — MONTELUKAST SOD 10 MG TAB: 10 | 30 days supply | Qty: 30 | Fill #9

## 2020-01-02 MED FILL — XOLAIR 150 MG/ML SOSY: 150 | 28 days supply | Qty: 2 | Fill #3

## 2020-01-05 ENCOUNTER — Other Ambulatory Visit: Payer: Self-pay

## 2020-01-05 ENCOUNTER — Ambulatory Visit (INDEPENDENT_AMBULATORY_CARE_PROVIDER_SITE_OTHER): Payer: No Typology Code available for payment source

## 2020-01-05 DIAGNOSIS — J4540 Moderate persistent asthma, uncomplicated: Secondary | ICD-10-CM

## 2020-01-05 MED ORDER — OMALIZUMAB 150 MG/ML ~~LOC~~ SOSY
300.0000 mg | PREFILLED_SYRINGE | Freq: Once | SUBCUTANEOUS | Status: AC
  Administered 2020-01-05: 300 mg via SUBCUTANEOUS

## 2020-01-05 NOTE — Progress Notes (Signed)
All questions were answered by the patient before medication was administered. Have you been hospitalized in the last 10 days? No Do you have a fever? No Do you have a cough? No Do you have a headache or sore throat? No  

## 2020-01-13 ENCOUNTER — Telehealth: Payer: No Typology Code available for payment source | Admitting: Family

## 2020-01-13 DIAGNOSIS — J029 Acute pharyngitis, unspecified: Secondary | ICD-10-CM

## 2020-01-13 MED ORDER — AZITHROMYCIN 250 MG PO TABS: ORAL_TABLET | ORAL | 0 refills | Status: DC

## 2020-01-13 NOTE — Progress Notes (Signed)

## 2020-01-16 ENCOUNTER — Telehealth: Payer: No Typology Code available for payment source | Admitting: Family

## 2020-01-16 DIAGNOSIS — J4540 Moderate persistent asthma, uncomplicated: Secondary | ICD-10-CM | POA: Diagnosis not present

## 2020-01-16 MED ORDER — PREDNISONE 20 MG PO TABS: ORAL_TABLET | ORAL | 0 refills | Status: DC

## 2020-01-16 MED ORDER — ALBUTEROL SULFATE HFA 108 (90 BASE) MCG/ACT IN AERS: 2.0000 | INHALATION_SPRAY | Freq: Four times a day (QID) | RESPIRATORY_TRACT | 0 refills | Status: DC | PRN

## 2020-01-16 NOTE — Progress Notes (Signed)
Visit for Asthma  Based on what you have shared with me, it looks like you may have a flare up of your asthma.  Asthma is a chronic (ongoing) lung disease which results in airway obstruction, inflammation and hyper-responsiveness.   Asthma symptoms vary from person to person, with common symptoms including nighttime awakening and decreased ability to participate in normal activities as a result of shortness of breath. It is often triggered by changes in weather, changes in the season, changes in air temperature, or inside (home, school, daycare or work) allergens such as animal dander, mold, mildew, woodstoves or cockroaches.   It can also be triggered by hormonal changes, extreme emotion, physical exertion or an upper respiratory tract illness.     It is important to identify the trigger, and then eliminate or avoid the trigger if possible.   If you have been prescribed medications to be taken on a regular basis, it is important to follow the asthma action plan and to follow guidelines to adjust medication in response to increasing symptoms of decreased peak expiratory flow rate  Treatment: I have prescribed: Albuterol (Proventil HFA; Ventolin HFA) 108 (90 Base) MCG/ACT Inhaler 2 puffs into the lungs every six hours as needed for wheezing or shortness of breath and Prednisone dose pack   HOME CARE . Only take medications as instructed by your medical team. . Consider wearing a mask or scarf to improve breathing air temperature have been shown to decrease irritation and decrease exacerbations . Get rest. . Taking a steamy shower or using a humidifier may help nasal congestion sand ease sore throat pain. You can place a towel over your head and breathe in the steam from hot water coming from a faucet. . Using a saline nasal spray works much the same way.  . Cough drops, hare candies and  sore throat lozenges may ease your cough.  . Avoid close contacts especially the very you and the elderly . Cover your mouth if you cough or sneeze . Always remember to wash your hands.    GET HELP RIGHT AWAY IF: . You develop worsening symptoms; breathlessness at rest, drowsy, confused or agitated, unable to speak in full sentences . You have coughing fits . You develop a severe headache or visual changes . You develop shortness of breath, difficulty breathing or start having chest pain . Your symptoms persist after you have completed your treatment plan . If your symptoms do not improve within 10 days  MAKE SURE YOU . Understand these instructions. . Will watch your condition. . Will get help right away if you are not doing well or get worse.   Your e-visit answers were reviewed by a board certified advanced clinical practitioner to complete your personal care plan, Depending upon the condition, your plan could have included both over the counter or prescription medications.  Please review your pharmacy choice. Your safety is important to [place]. If you have drug allergies check your prescription carefully. You can use MyChart to ask questions about today's visit, request a non-urgent call back, or ask for a work or school excuse for 24 hours related to this e-Visit. If it has been greater than 24 hours you will need to follow up with your provider, or enter a new e-Visit to address those concerns.  You will get an e-mail in the next two days asking about your experience. I hope that your e-visit has been valuable and will speed your recovery. Thank you for using e-visits.  Approximately  5 minutes was spent documenting and reviewing patient's chart.

## 2020-01-17 MED FILL — predniSONE 20 MG TABS: 20 | 21 days supply | Qty: 42 | Fill #0

## 2020-01-17 MED FILL — LEVOCETIRIZINE 5 MG TABLET: 5 | 30 days supply | Qty: 30 | Fill #1

## 2020-01-17 MED FILL — ALBUTEROL SULFATE HFA 108 (: 108 (90 BAS | 25 days supply | Qty: 18 | Fill #0

## 2020-01-17 NOTE — Telephone Encounter (Signed)
Spoke with the pt  She states dx with strep by PCP on 01/13/20- prescribed Zpack  She states her chest feels tight, having wheezing, increased SOB and cough- non prod x 5-6 days  She states feels like she needs some pred  She is using symbicort daily and her singulair and has been having to use her albuterol inhaler several times per day  Please advise, thanks!  Allergies  Allergen Reactions  . Benadryl [Diphenhydramine Hcl] Shortness Of Breath    Pt states that she feels fine if she sits down, but if she is moving around after taking benadryl, she feels SOB  . Penicillins Shortness Of Breath    Has patient had a PCN reaction causing immediate rash, facial/tongue/throat swelling, SOB or lightheadedness with hypotension: yes Has patient had a PCN reaction causing severe rash involving mucus membranes or skin necrosis: no Has patient had a PCN reaction that required hospitalization: no Has patient had a PCN reaction occurring within the last 10 years: no If all of the above answers are "NO", then may proceed with Cephalosporin use. Patient reports she CAN TAKE kEFLEX AND SUPRAX  . Adhesive [Tape] Hives  . Percocet [Oxycodone-Acetaminophen]   . Lactose Intolerance (Gi) Diarrhea and Nausea And Vomiting  . Latex Rash  . Sulfa Antibiotics Rash  . Ultram [Tramadol Hcl] Rash

## 2020-01-17 NOTE — Telephone Encounter (Signed)
It looks like she was sent in prednisone on 5/31 from [facility]. She should be taking Symbicort twice a day.

## 2020-01-20 MED FILL — MONTELUKAST SOD 10 MG TAB: 10 | 30 days supply | Qty: 30 | Fill #10

## 2020-02-02 ENCOUNTER — Other Ambulatory Visit: Payer: Self-pay

## 2020-02-02 ENCOUNTER — Ambulatory Visit (INDEPENDENT_AMBULATORY_CARE_PROVIDER_SITE_OTHER): Payer: No Typology Code available for payment source

## 2020-02-02 DIAGNOSIS — J4540 Moderate persistent asthma, uncomplicated: Secondary | ICD-10-CM

## 2020-02-02 MED ORDER — OMALIZUMAB 150 MG/ML ~~LOC~~ SOSY
300.0000 mg | PREFILLED_SYRINGE | Freq: Once | SUBCUTANEOUS | Status: AC
  Administered 2020-02-02: 300 mg via SUBCUTANEOUS

## 2020-02-08 MED ORDER — SYMBICORT 160-4.5 MCG/ACT IN AERO: 2.0000 | INHALATION_SPRAY | Freq: Two times a day (BID) | RESPIRATORY_TRACT | 1 refills | Status: DC

## 2020-02-08 MED FILL — PHEXXI 1.8-1-0.4 % GEL: 1.8-1-0.4 | 12 days supply | Qty: 60 | Fill #1

## 2020-02-17 ENCOUNTER — Telehealth: Payer: No Typology Code available for payment source | Admitting: Family

## 2020-02-17 DIAGNOSIS — H9203 Otalgia, bilateral: Secondary | ICD-10-CM | POA: Diagnosis not present

## 2020-02-17 MED ORDER — DOXYCYCLINE HYCLATE 100 MG PO TABS: 100.0000 mg | ORAL_TABLET | Freq: Two times a day (BID) | ORAL | 0 refills | Status: DC

## 2020-02-17 NOTE — Progress Notes (Signed)
E Visit for Swimmer's Ear  We are sorry that you are not feeling well. Here is how we plan to help!  It looks like you have an ear infection. I have sent an doxycycline that she will take twice a day for 10 days.  In certain cases swimmer's ear may progress to a more serious bacterial infection of the middle or inner ear.  If you have a fever 102 and up and significantly worsening symptoms, this could indicate a more serious infection moving to the middle/inner and needs face to face evaluation in an office by a provider.  Your symptoms should improve over the next 3 days and should resolve in about 7 days.  HOME CARE:   Wash your hands frequently.  Do not place the tip of the bottle on your ear or touch it with your fingers.  You can take Acetominophen 650 mg every 4-6 hours as needed for pain.  If pain is severe or moderate, you can apply a heating pad (set on low) or hot water bottle (wrapped in a towel) to outer ear for 20 minutes.  This will also increase drainage.  Avoid ear plugs  Do not use Q-tips  After showers, help the water run out by tilting your head to one side.  GET HELP RIGHT AWAY IF:   Fever is over 102.2 degrees.  You develop progressive ear pain or hearing loss.  Ear symptoms persist longer than 3 days after treatment.  MAKE SURE YOU:   Understand these instructions.  Will watch your condition.  Will get help right away if you are not doing well or get worse.  TO PREVENT SWIMMER'S EAR:  Use a bathing cap or custom fitted swim molds to keep your ears dry.  Towel off after swimming to dry your ears.  Tilt your head or pull your earlobes to allow the water to escape your ear canal.  If there is still water in your ears, consider using a hairdryer on the lowest setting.  Thank you for choosing an e-visit. Your e-visit answers were reviewed by a board certified advanced clinical practitioner to complete your personal care plan. Depending upon the  condition, your plan could have included both over the counter or prescription medications. Please review your pharmacy choice. Be sure that the pharmacy you have chosen is open so that you can pick up your prescription now.  If there is a problem you may message your provider in [place] to have the prescription routed to another pharmacy. Your safety is important to [place]. If you have drug allergies check your prescription carefully.  For the next 24 hours, you can use MyChart to ask questions about today's visit, request a non-urgent call back, or ask for a work or school excuse from your e-visit provider. You will get an email in the next two days asking about your experience. I hope that your e-visit has been valuable and will speed your recovery.    Approximately 5 minutes was spent documenting and reviewing patient's chart.

## 2020-02-22 ENCOUNTER — Other Ambulatory Visit: Payer: Self-pay | Admitting: Emergency Medicine

## 2020-02-22 MED FILL — LEVOCETIRIZINE 5 MG TABLET: 5 | 30 days supply | Qty: 30 | Fill #2

## 2020-02-22 MED FILL — MONTELUKAST SOD 10 MG TAB: 10 | 30 days supply | Qty: 30 | Fill #0

## 2020-02-23 MED FILL — XOLAIR 150 MG/ML SOSY: 150 | 28 days supply | Qty: 2 | Fill #5

## 2020-03-01 ENCOUNTER — Other Ambulatory Visit: Payer: Self-pay

## 2020-03-01 ENCOUNTER — Ambulatory Visit (INDEPENDENT_AMBULATORY_CARE_PROVIDER_SITE_OTHER): Payer: No Typology Code available for payment source

## 2020-03-01 DIAGNOSIS — J4540 Moderate persistent asthma, uncomplicated: Secondary | ICD-10-CM

## 2020-03-01 MED ORDER — OMALIZUMAB 150 MG/ML ~~LOC~~ SOSY
300.0000 mg | PREFILLED_SYRINGE | Freq: Once | SUBCUTANEOUS | Status: AC
  Administered 2020-03-01: 300 mg via SUBCUTANEOUS

## 2020-03-01 NOTE — Progress Notes (Signed)
All questions were answered by the patient before medication was administered. Have you been hospitalized in the last 10 days? No Do you have a fever? No Do you have a cough? No Do you have a headache or sore throat? No  

## 2020-03-18 ENCOUNTER — Telehealth: Payer: No Typology Code available for payment source | Admitting: Physician Assistant

## 2020-03-18 ENCOUNTER — Encounter: Payer: Self-pay | Admitting: Physician Assistant

## 2020-03-18 DIAGNOSIS — B373 Candidiasis of vulva and vagina: Secondary | ICD-10-CM

## 2020-03-18 DIAGNOSIS — B3731 Acute candidiasis of vulva and vagina: Secondary | ICD-10-CM

## 2020-03-18 MED ORDER — FLUCONAZOLE 150 MG PO TABS: 150.0000 mg | ORAL_TABLET | Freq: Once | ORAL | 0 refills | Status: AC

## 2020-03-18 NOTE — Progress Notes (Signed)
We are sorry that you are not feeling well. Here is how we plan to help! Based on what you shared with me it looks like you: May have a yeast vaginosis  Vaginosis is an inflammation of the vagina that can result in discharge, itching and pain. The cause is usually a change in the normal balance of vaginal bacteria or an infection. Vaginosis can also result from reduced estrogen levels after menopause.  The most common causes of vaginosis are:   Bacterial vaginosis which results from an overgrowth of one on several organisms that are normally present in your vagina.   Yeast infections which are caused by a naturally occurring fungus called candida.   Vaginal atrophy (atrophic vaginosis) which results from the thinning of the vagina from reduced estrogen levels after menopause.   Trichomoniasis which is caused by a parasite and is commonly transmitted by sexual intercourse.  Factors that increase your risk of developing vaginosis include: . Medications, such as antibiotics and steroids . Uncontrolled diabetes . Use of hygiene products such as bubble bath, vaginal spray or vaginal deodorant . Douching . Wearing damp or tight-fitting clothing . Using an intrauterine device (IUD) for birth control . Hormonal changes, such as those associated with pregnancy, birth control pills or menopause . Sexual activity . Having a sexually transmitted infection  Your treatment plan is A single Diflucan (fluconazole) 150mg tablet once.  I have electronically sent this prescription into the pharmacy that you have chosen.  Be sure to take all of the medication as directed. Stop taking any medication if you develop a rash, tongue swelling or shortness of breath. Mothers who are breast feeding should consider pumping and discarding their breast milk while on these antibiotics. However, there is no consensus that infant exposure at these doses would be harmful.  Remember that medication creams can weaken latex  condoms. .   HOME CARE:  Good hygiene may prevent some types of vaginosis from recurring and may relieve some symptoms:  . Avoid baths, hot tubs and whirlpool spas. Rinse soap from your outer genital area after a shower, and dry the area well to prevent irritation. Don't use scented or harsh soaps, such as those with deodorant or antibacterial action. . Avoid irritants. These include scented tampons and pads. . Wipe from front to back after using the toilet. Doing so avoids spreading fecal bacteria to your vagina.  Other things that may help prevent vaginosis include:  . Don't douche. Your vagina doesn't require cleansing other than normal bathing. Repetitive douching disrupts the normal organisms that reside in the vagina and can actually increase your risk of vaginal infection. Douching won't clear up a vaginal infection. . Use a latex condom. Both female and female latex condoms may help you avoid infections spread by sexual contact. . Wear cotton underwear. Also wear pantyhose with a cotton crotch. If you feel comfortable without it, skip wearing underwear to bed. Yeast thrives in moist environments Your symptoms should improve in the next day or two.  GET HELP RIGHT AWAY IF:  . You have pain in your lower abdomen ( pelvic area or over your ovaries) . You develop nausea or vomiting . You develop a fever . Your discharge changes or worsens . You have persistent pain with intercourse . You develop shortness of breath, a rapid pulse, or you faint.  These symptoms could be signs of problems or infections that need to be evaluated by a medical provider now.  MAKE SURE YOU      Understand these instructions.  Will watch your condition.  Will get help right away if you are not doing well or get worse.  Your e-visit answers were reviewed by a board certified advanced clinical practitioner to complete your personal care plan. Depending upon the condition, your plan could have included  both over the counter or prescription medications. Please review your pharmacy choice to make sure that you have choses a pharmacy that is open for you to pick up any needed prescription, Your safety is important to us. If you have drug allergies check your prescription carefully.   You can use MyChart to ask questions about today's visit, request a non-urgent call back, or ask for a work or school excuse for 24 hours related to this e-Visit. If it has been greater than 24 hours you will need to follow up with your provider, or enter a new e-Visit to address those concerns. You will get a MyChart message within the next two days asking about your experience. I hope that your e-visit has been valuable and will speed your recovery.  I spent 5-10 minutes on review and completion of this note- Raven Furnas PAC  

## 2020-03-19 MED FILL — FLUCONAZOLE 150 MG TABS: 150 | 1 days supply | Qty: 1 | Fill #0

## 2020-03-26 MED FILL — XOLAIR 150 MG/ML SOSY: 150 | 28 days supply | Qty: 2 | Fill #6

## 2020-03-27 MED FILL — MONTELUKAST SOD 10 MG TAB: 10 | 30 days supply | Qty: 30 | Fill #1

## 2020-03-27 MED FILL — LEVOCETIRIZINE 5 MG TABLET: 5 | 30 days supply | Qty: 30 | Fill #3

## 2020-03-29 ENCOUNTER — Other Ambulatory Visit: Payer: Self-pay

## 2020-03-29 ENCOUNTER — Ambulatory Visit (INDEPENDENT_AMBULATORY_CARE_PROVIDER_SITE_OTHER): Payer: No Typology Code available for payment source

## 2020-03-29 DIAGNOSIS — J4540 Moderate persistent asthma, uncomplicated: Secondary | ICD-10-CM

## 2020-03-29 MED ORDER — OMALIZUMAB 150 MG/ML ~~LOC~~ SOSY
300.0000 mg | PREFILLED_SYRINGE | Freq: Once | SUBCUTANEOUS | Status: AC
  Administered 2020-03-29: 300 mg via SUBCUTANEOUS

## 2020-03-29 NOTE — Progress Notes (Signed)
Have you been hospitalized within the last 10 days?  No Do you have a fever?  No Do you have a cough?  No Do you have a headache or sore throat? No Do you have your Epi Pen visible and is it within date?  Yes 

## 2020-04-01 ENCOUNTER — Telehealth: Payer: No Typology Code available for payment source | Admitting: Nurse Practitioner

## 2020-04-01 DIAGNOSIS — B9789 Other viral agents as the cause of diseases classified elsewhere: Secondary | ICD-10-CM

## 2020-04-01 DIAGNOSIS — J329 Chronic sinusitis, unspecified: Secondary | ICD-10-CM

## 2020-04-01 MED ORDER — FLUTICASONE PROPIONATE 50 MCG/ACT NA SUSP: 2.0000 | Freq: Every day | NASAL | 6 refills | Status: DC

## 2020-04-01 NOTE — Progress Notes (Signed)

## 2020-04-02 MED FILL — FLUTICASONE PROP 50 MCG SPR: 50 | 30 days supply | Qty: 16 | Fill #0

## 2020-04-08 ENCOUNTER — Telehealth: Payer: No Typology Code available for payment source | Admitting: Family

## 2020-04-08 DIAGNOSIS — J019 Acute sinusitis, unspecified: Secondary | ICD-10-CM

## 2020-04-08 MED ORDER — DOXYCYCLINE HYCLATE 100 MG PO TABS: 100.0000 mg | ORAL_TABLET | Freq: Two times a day (BID) | ORAL | 0 refills | Status: DC

## 2020-04-08 NOTE — Progress Notes (Signed)
We are sorry that you are not feeling well.  Here is how we plan to help!  Based on what you have shared with me it looks like you have sinusitis.  Sinusitis is inflammation and infection in the sinus cavities of the head.  Based on your presentation I believe you most likely have Acute Bacterial Sinusitis.  This is an infection caused by bacteria and is treated with antibiotics. I have prescribed Doxycycline 100mg by mouth twice a day for 10 days. You may use an oral decongestant such as Mucinex D or if you have glaucoma or high blood pressure use plain Mucinex. Saline nasal spray help and can safely be used as often as needed for congestion.  If you develop worsening sinus pain, fever or notice severe headache and vision changes, or if symptoms are not better after completion of antibiotic, please schedule an appointment with a health care provider.    Sinus infections are not as easily transmitted as other respiratory infection, however we still recommend that you avoid close contact with loved ones, especially the very young and elderly.  Remember to wash your hands thoroughly throughout the day as this is the number one way to prevent the spread of infection!  Home Care:  Only take medications as instructed by your medical team.  Complete the entire course of an antibiotic.  Do not take these medications with alcohol.  A steam or ultrasonic humidifier can help congestion.  You can place a towel over your head and breathe in the steam from hot water coming from a faucet.  Avoid close contacts especially the very young and the elderly.  Cover your mouth when you cough or sneeze.  Always remember to wash your hands.  Get Help Right Away If:  You develop worsening fever or sinus pain.  You develop a severe head ache or visual changes.  Your symptoms persist after you have completed your treatment plan.  Make sure you  Understand these instructions.  Will watch your  condition.  Will get help right away if you are not doing well or get worse.  Your e-visit answers were reviewed by a board certified advanced clinical practitioner to complete your personal care plan.  Depending on the condition, your plan could have included both over the counter or prescription medications.  If there is a problem please reply  once you have received a response from your provider.  Your safety is important to us.  If you have drug allergies check your prescription carefully.    You can use MyChart to ask questions about today's visit, request a non-urgent call back, or ask for a work or school excuse for 24 hours related to this e-Visit. If it has been greater than 24 hours you will need to follow up with your provider, or enter a new e-Visit to address those concerns.  You will get an e-mail in the next two days asking about your experience.  I hope that your e-visit has been valuable and will speed your recovery. Thank you for using e-visits.   Approximately 5 minutes was spent documenting and reviewing patient's chart.  

## 2020-04-09 MED FILL — DOXYCYCLINE HYCLATE 100 MG: 100 | 10 days supply | Qty: 20 | Fill #0

## 2020-04-11 ENCOUNTER — Other Ambulatory Visit: Payer: Self-pay

## 2020-04-11 ENCOUNTER — Ambulatory Visit: Payer: No Typology Code available for payment source | Admitting: Emergency Medicine

## 2020-04-11 ENCOUNTER — Ambulatory Visit: Payer: No Typology Code available for payment source | Admitting: Family Medicine

## 2020-04-11 ENCOUNTER — Other Ambulatory Visit: Payer: Self-pay | Admitting: Obstetrics and Gynecology

## 2020-04-11 ENCOUNTER — Ambulatory Visit: Payer: No Typology Code available for payment source | Admitting: Obstetrics and Gynecology

## 2020-04-11 ENCOUNTER — Encounter: Payer: Self-pay | Admitting: Obstetrics and Gynecology

## 2020-04-11 ENCOUNTER — Encounter: Payer: Self-pay | Admitting: Emergency Medicine

## 2020-04-11 ENCOUNTER — Telehealth: Payer: Self-pay

## 2020-04-11 VITALS — BP 122/77 | HR 92 | Ht 70.0 in | Wt 207.0 lb

## 2020-04-11 VITALS — BP 122/76 | HR 90 | Temp 97.8°F | Ht 70.0 in | Wt 207.0 lb

## 2020-04-11 DIAGNOSIS — Z3202 Encounter for pregnancy test, result negative: Secondary | ICD-10-CM

## 2020-04-11 DIAGNOSIS — R05 Cough: Secondary | ICD-10-CM | POA: Diagnosis not present

## 2020-04-11 DIAGNOSIS — J4540 Moderate persistent asthma, uncomplicated: Secondary | ICD-10-CM | POA: Diagnosis not present

## 2020-04-11 DIAGNOSIS — R059 Cough, unspecified: Secondary | ICD-10-CM

## 2020-04-11 LAB — POCT URINE PREGNANCY: Preg Test, Ur: NEGATIVE

## 2020-04-11 MED ORDER — MEDROXYPROGESTERONE ACETATE 10 MG PO TABS: 10.0000 mg | ORAL_TABLET | Freq: Every day | ORAL | 3 refills | Status: DC

## 2020-04-11 MED ORDER — DOXYCYCLINE HYCLATE 100 MG PO TABS: 100.0000 mg | ORAL_TABLET | Freq: Two times a day (BID) | ORAL | 0 refills | Status: DC

## 2020-04-11 MED ORDER — DOXYCYCLINE HYCLATE 100 MG PO TABS: 100.0000 mg | ORAL_TABLET | Freq: Two times a day (BID) | ORAL | 0 refills | Status: AC

## 2020-04-11 MED ORDER — PREDNISONE 10 MG PO TABS: ORAL_TABLET | ORAL | 0 refills | Status: DC

## 2020-04-11 MED ORDER — HYDROCOD POLST-CPM POLST ER 10-8 MG/5ML PO SUER: 5.0000 mL | Freq: Two times a day (BID) | ORAL | 0 refills | Status: DC | PRN

## 2020-04-11 MED FILL — MEDROXYPROGESTERONE 10 MG T: 10 | 14 days supply | Qty: 14 | Fill #0

## 2020-04-11 MED FILL — HYDROCODONE-CHLORPHEN ER SU: 10-8 | 14 days supply | Qty: 140 | Fill #0

## 2020-04-11 MED FILL — predniSONE 10 MG TABS: 10 | 15 days supply | Qty: 30 | Fill #0

## 2020-04-11 NOTE — Assessment & Plan Note (Signed)
With normal airflows on basic spirometry but a positive methacholine challenge consistent with bronchospasm. Complaining today of exacerbated symptoms that sound like congestion, some upper airway lability and cough following an upper respiratory infection over the last several weeks. She is not wheezing on exam. She may have a bronchitis and may benefit from prednisone for her upper airway irritation but I do not think this is bronchospasm. She is asking for Tussionex. I explained to her that I am not comfortable filling this on a repetitive basis but that I could do so now for symptom relief.  Prescription given for prednisone taper and doxycycline. I subsequently learned that she already had a doxycycline prescription waiting for her at the pharmacy that was written on 8/22. The patient did not inform me about this. She also asked me if I would fill out an exemption form so that she would not have to get the COVID-19 vaccination for work, based on a newly identified sensitivity to corn products. I am not comfortable doing this, do not have any proof or documentation that such a sensitivity actually exists or that it would impact her ability to take vaccine. I offered to refer her to allergy to see if any food sensitivity testing would be relevant, helpful.

## 2020-04-11 NOTE — Progress Notes (Signed)
Subjective:    Patient ID: Tammy Chapman, female    DOB: 06/18/1991, 29 y.o.   MRN: [id number]   HPI  ROV 04/11/20 --follow-up visit 29 year old woman with moderate persistent asthma, some associated upper airway irritation and lability with cough, impacted by GERD and allergic rhinitis.  She has multiple allergies and an elevated IgE.  She started Xolair in early March. She still has nasal congestion, feels that her overall sx have been better controlled. Not waking up at night to use her SABA. She caught a URI about 3 weeks ago and has had increased sx, green mucous since. Her last prednisone was about late May. She recently discovered that she may be allegic to corn products > has noticed some mouth and tongue swelling. ? Which substances, she has not seen Allergy.  Currently maintained on Symbicort, Singulair, Xyzal, Symbicort, nasal steroid.    Review of Systems  Constitutional: Negative for fever and unexpected weight change.  HENT: Negative for congestion, dental problem, ear pain, nosebleeds, postnasal drip, rhinorrhea, sinus pressure, sneezing, sore throat and trouble swallowing.   Eyes: Negative for redness and itching.  Respiratory: Positive for cough, shortness of breath and wheezing. Negative for chest tightness.   Cardiovascular: Negative for palpitations and leg swelling.  Gastrointestinal: Negative for nausea and vomiting.  Genitourinary: Negative for dysuria.  Musculoskeletal: Negative for joint swelling.  Skin: Negative for rash.  Neurological: Negative for headaches.  Hematological: Does not bruise/bleed easily.  Psychiatric/Behavioral: Negative for dysphoric mood. The patient is not nervous/anxious.        Objective:   Physical Exam Today's Vitals   04/11/20 0853  BP: 122/76  Pulse: 90  Temp: 97.8 F (36.6 C)  SpO2: 98%  Weight: 207 lb (93.9 kg)  Height: 5\' 10"  (1.778 m)   Body mass index is 29.7 kg/m.   Gen: Pleasant, well-nourished, in no distress,   normal affect  ENT: No lesions,  mouth clear,  oropharynx clear, no postnasal drip  Neck: No JVD, no UA noise  Lungs: No use of accessory muscles, no crackles or wheezing on normal respiration, no wheeze on forced expiration  Cardiovascular: RRR, heart sounds normal, no murmur or gallops, no peripheral edema  Musculoskeletal: No deformities, no cyanosis or clubbing  Neuro: alert, awake, non focal  Skin: Warm, no lesions or rash     Assessment & Plan:  Chronic asthma With normal airflows on basic spirometry but a positive methacholine challenge consistent with bronchospasm. Complaining today of exacerbated symptoms that sound like congestion, some upper airway lability and cough following an upper respiratory infection over the last several weeks. She is not wheezing on exam. She may have a bronchitis and may benefit from prednisone for her upper airway irritation but I do not think this is bronchospasm. She is asking for Tussionex. I explained to her that I am not comfortable filling this on a repetitive basis but that I could do so now for symptom relief.  Prescription given for prednisone taper and doxycycline. I subsequently learned that she already had a doxycycline prescription waiting for her at the pharmacy that was written on 8/22. The patient did not inform me about this. She also asked me if I would fill out an exemption form so that she would not have to get the COVID-19 vaccination for work, based on a newly identified sensitivity to corn products. I am not comfortable doing this, do not have any proof or documentation that such a sensitivity actually exists or that it  would impact her ability to take vaccine. I offered to refer her to allergy to see if any food sensitivity testing would be relevant, helpful.    Baltazar Apo, MD, PhD 04/11/2020, 4:58 PM [facility] [phone] or if no answer [phone]

## 2020-04-11 NOTE — Progress Notes (Signed)
PATIENT ID: Tammy Chapman, female     DOB: 11-04-1990, 29 y.o.     MRN: [id number]   [facility] Visit  04/11/20     PATIENT NAME: Tammy Chapman     MRN [id number]     DOB: 30-Jan-1991  CC & HPI:   Chief Complaint  Patient presents with  . Menstrual Problem    no period in 70 days, negative pregnancy tests multiple   Tammy Chapman is a 29 y.o. female presenting today for an evaluation of her abnormal menses following the birth of her last child, 9 months ago.   She notes that her cycle was regular at first, but then she skipped a month in 10/2019. Now, she hasn't had a cycle since 01/2020. She has also gained weight even with exercise and dieting. At her lowest, she weighed 190 lbs after her last birth.  She had progressively more irregular cycles until just before her first child, did well during the interim and seems to be resuming the pattern of irregular cycles, weight gain that she experienced prior to her first child ROS:  Review of Systems  Constitutional: Negative.   HENT: Negative.   Eyes: Negative.   Respiratory: Negative.   Cardiovascular: Negative.   Gastrointestinal: Negative.   Endocrine: Negative.   Genitourinary: Positive for menstrual problem (irregular).  Musculoskeletal: Negative.   Skin: Negative.   Allergic/Immunologic: Negative.   Neurological: Negative.   Hematological: Negative.   Psychiatric/Behavioral: Negative.   All other systems reviewed and are negative.    Pertinent History Reviewed:  Medical         Past Medical History:  Diagnosis Date  . Asthma   . Bronchitis, mucopurulent recurrent ([place]) 01/19/2019  . Chronic asthma 01/19/2019  . GERD (gastroesophageal reflux disease)   . Loose body of right knee 02/2013  . Mosquito bite 03/08/2013  . Pregnancy induced hypertension   . Recurrent otitis media of both ears 01/19/2019  . Sprain and strain of medial collateral ligament of knee 02/2013   right                              Surgical Hx:     Past Surgical History:  Procedure Laterality Date  . CESAREAN SECTION N/A 01/08/2017   Procedure: CESAREAN SECTION;  Surgeon: Jonnie Kind, MD;  Location: [facility];  Service: Obstetrics;  Laterality: N/A;  . CESAREAN SECTION N/A 07/01/2019   Procedure: REPEAT CESAREAN SECTION;  Surgeon: Jonnie Kind, MD;  Location: [facility];  Service: Obstetrics;  Laterality: N/A;  . CHOLECYSTECTOMY  01/20/2008  . EAR TUBE REMOVAL  2002  . ESOPHAGOGASTRODUODENOSCOPY  05/15/2011   Procedure: ESOPHAGOGASTRODUODENOSCOPY (EGD);  Surgeon: Rogene Houston, MD;  Location: [facility];  Service: Endoscopy;  Laterality: N/A;  3:15   . KNEE ARTHROSCOPY Right 03/15/2013   Procedure: RIGHT  KNEE ARTHROSCOPY, FEMORAL PATELLA REEFING, LATERAL RELEASE, PARTIAL LATERAL MENISCECTOMY ;  Surgeon: Lorn Junes, MD;  Location: [facility];  Service: Orthopedics;  Laterality: Right;  . TONSILLECTOMY AND ADENOIDECTOMY  1999  . TYMPANOSTOMY TUBE PLACEMENT  1999, 2011   Medications: Reviewed & Updated - see associated section                       Current Outpatient Medications:  .  albuterol (VENTOLIN HFA) 108 (90 Base) MCG/ACT inhaler, Inhale 2 puffs into the lungs every 6 (  six) hours as needed for wheezing or shortness of breath., Disp: 8 g, Rfl: 0 .  chlorpheniramine-HYDROcodone (TUSSIONEX PENNKINETIC ER) 10-8 MG/5ML SUER, Take 5 mLs by mouth every 12 (twelve) hours as needed for cough., Disp: 140 mL, Rfl: 0 .  doxycycline (VIBRA-TABS) 100 MG tablet, Take 1 tablet (100 mg total) by mouth 2 (two) times daily for 7 days., Disp: 14 tablet, Rfl: 0 .  fluticasone (FLONASE) 50 MCG/ACT nasal spray, Place 2 sprays into both nostrils daily., Disp: 16 g, Rfl: 6 .  levocetirizine (XYZAL) 5 MG tablet, TAKE 1 TABLET (5 MG TOTAL) BY MOUTH EVERY EVENING., Disp: 30 tablet, Rfl: 5 .  montelukast (SINGULAIR) 10 MG tablet, TAKE 1 TABLET BY MOUTH AT BEDTIME, Disp: 30 tablet, Rfl: 10 .  omalizumab (XOLAIR) 150 MG/ML  prefilled syringe, Inject 300 mg into the skin every 28 (twenty-eight) days., Disp: 2 mL, Rfl: 12 .  PHEXXI 1.8-1-0.4 % GEL, PLACE 1 APPLICATORFUL VAGINALLY AS NEEDED. IMMEDIATELY BEFORE OR UP TO 1 HOUR BEFORE SEX, Disp: 60 g, Rfl: 11 .  predniSONE (DELTASONE) 10 MG tablet, Take 4 tablets X 3 days, 3 tabs X 3 days, 2 tabs x 3 days, 1 tab x 3 days, Disp: 30 tablet, Rfl: 0 .  PROAIR HFA 108 (90 Base) MCG/ACT inhaler, Inhale 2 puffs into the lungs every 4 (four) hours as needed for wheezing or shortness of breath., Disp: 54 g, Rfl: 1 .  SYMBICORT 160-4.5 MCG/ACT inhaler, Inhale 2 puffs into the lungs 2 (two) times daily., Disp: 3 Inhaler, Rfl: 1   Social History: Reviewed -  reports that she has never smoked. She has never used smokeless tobacco.  Objective Findings:  Vitals: Blood pressure 122/77, pulse 92, height 5\' 10"  (1.778 m), weight 207 lb (93.9 kg), not currently breastfeeding.  PHYSICAL EXAMINATION General appearance - alert, well appearing, and in no distress, oriented to person, place, and time and normal appearing weight Mental status - alert, oriented to person, place, and time, normal mood, behavior, speech, dress, motor activity, and thought processes, affect appropriate to mood Chest - not examined Heart - not examined Abdomen - not examined Breasts - not examined Skin - normal coloration and turgor, no rashes, no suspicious skin lesions noted   Assessment & Plan:   A:  1. Anovulatory cycles 2.   P: Consder provera 1 month pl lmd 1.  provera 10 mg x 14 d refil q qararterl............................Marland [id number]   By signing my name below, I, [employer], attest that this documentation has been prepared under the direction and in the presence of Jonnie Kind, MD. Electronically Signed: [place]. 04/11/20. 4:32 PM.  I personally performed the services described in this documentation, which was SCRIBED in my presence. The recorded information has  been reviewed and considered accurate. It has been edited as necessary during review. Jonnie Kind, MD

## 2020-04-11 NOTE — Patient Instructions (Addendum)
Take prednisone as directed until completely gone Take doxycycline as directed until completely gone Continue your Symbicort 2 puffs twice a day.  Rinse and gargle after using. Continue your Xolair once monthly Continue Singulair 10 mg each evening Continue Xyzal as you have been taking it Continue fluticasone nasal spray, 2 sprays each nostril once daily. Use Tussionex 5 cc up to every 12 hours if needed for cough suppression. You might want to consider trying nasal saline rinses to help with your allergic, upper airway and congestion symptoms We will refer you to allergy for specific food sensitivity testing to corn derived products. Follow with Dr Lamonte Sakai in 4 months or sooner if you have any problems.

## 2020-04-11 NOTE — Telephone Encounter (Signed)
ATC and left voicemail for pt to return call to office related to  Doxycycline order. Pt is to continue Doxycycline order given by Evelina Dun on 8/22/201 and pharmacy was instructed to cancel Doxycycline order form Dr. Lamonte Sakai per Dr. Lamonte Sakai orders. Encounter routed to Dr.Byrum for FYI.

## 2020-04-25 ENCOUNTER — Telehealth: Payer: No Typology Code available for payment source | Admitting: Family

## 2020-04-25 DIAGNOSIS — J069 Acute upper respiratory infection, unspecified: Secondary | ICD-10-CM | POA: Diagnosis not present

## 2020-04-25 MED ORDER — BENZONATATE 100 MG PO CAPS: 100.0000 mg | ORAL_CAPSULE | Freq: Three times a day (TID) | ORAL | 0 refills | Status: DC | PRN

## 2020-04-25 MED ORDER — FLUTICASONE PROPIONATE 50 MCG/ACT NA SUSP: 2.0000 | Freq: Every day | NASAL | 6 refills | Status: DC

## 2020-04-25 MED FILL — BENZONATATE 100 MG CAPS: 100 | 6 days supply | Qty: 20 | Fill #0

## 2020-04-25 NOTE — Progress Notes (Signed)

## 2020-04-26 MED FILL — FLUTICASONE PROP 50 MCG SPR: 50 | 30 days supply | Qty: 16 | Fill #0

## 2020-04-26 MED FILL — XOLAIR 150 MG/ML SOSY: 150 | 28 days supply | Qty: 2 | Fill #7

## 2020-04-27 ENCOUNTER — Telehealth: Payer: No Typology Code available for payment source | Admitting: Physician Assistant

## 2020-04-27 DIAGNOSIS — H109 Unspecified conjunctivitis: Secondary | ICD-10-CM | POA: Diagnosis not present

## 2020-04-27 DIAGNOSIS — B9689 Other specified bacterial agents as the cause of diseases classified elsewhere: Secondary | ICD-10-CM

## 2020-04-27 MED ORDER — OFLOXACIN 0.3 % OP SOLN: 1.0000 [drp] | Freq: Four times a day (QID) | OPHTHALMIC | 0 refills | Status: DC

## 2020-04-27 MED FILL — MONTELUKAST SOD 10 MG TAB: 10 | 30 days supply | Qty: 30 | Fill #2

## 2020-04-27 MED FILL — OFLOXACIN 0.3% EYE DROPS: 0.3 | 25 days supply | Qty: 5 | Fill #0

## 2020-04-27 MED FILL — LEVOCETIRIZINE 5 MG TABLET: 5 | 30 days supply | Qty: 30 | Fill #4

## 2020-04-27 NOTE — Progress Notes (Signed)
E-Visit for Tammy Chapman   We are sorry that you are not feeling well.  Here is how we plan to help!  Based on what you have shared with me it looks like you have conjunctivitis.  Conjunctivitis is a common inflammatory or infectious condition of the eye that is often referred to as "pink eye".  In most cases it is contagious (viral or bacterial). However, not all conjunctivitis requires antibiotics (ex. Allergic).  We have made appropriate suggestions for you based upon your presentation.  I have prescribed Oflaxacin 1-2 drops 4 times a day times 5 days   Stop using your contacts while you have this infection and throw them away. Use new ones after the infection has cleared.  Pink eye can be highly contagious.  It is typically spread through direct contact with secretions, or contaminated objects or surfaces that one may have touched.  Strict handwashing is suggested with soap and water is urged.  If not available, use alcohol based had sanitizer.  Avoid unnecessary touching of the eye.  If you wear contact lenses, you will need to refrain from wearing them until you see no white discharge from the eye for at least 24 hours after being on medication.  You should see symptom improvement in 1-2 days after starting the medication regimen.  Call us if symptoms are not improved in 1-2 days.  Home Care:  Wash your hands often!  Do not wear your contacts until you complete your treatment plan.  Avoid sharing towels, bed linen, personal items with a person who has pink eye.  See attention for anyone in your home with similar symptoms.  Get Help Right Away If:  Your symptoms do not improve.  You develop blurred or loss of vision.  Your symptoms worsen (increased discharge, pain or redness)  Your e-visit answers were reviewed by a board certified advanced clinical practitioner to complete your personal care plan.  Depending on the condition, your plan could have included both over the counter or  prescription medications.  If there is a problem please reply  once you have received a response from your provider.  Your safety is important to [place].  If you have drug allergies check your prescription carefully.    You can use MyChart to ask questions about today's visit, request a non-urgent call back, or ask for a work or school excuse for 24 hours related to this e-Visit. If it has been greater than 24 hours you will need to follow up with your provider, or enter a new e-Visit to address those concerns.   You will get an e-mail in the next two days asking about your experience.  I hope that your e-visit has been valuable and will speed your recovery. Thank you for using e-visits.     Greater than 5 minutes, yet less than 10 minutes of time have been spent researching, coordinating, and implementing care for this patient today

## 2020-04-30 ENCOUNTER — Ambulatory Visit (INDEPENDENT_AMBULATORY_CARE_PROVIDER_SITE_OTHER): Payer: No Typology Code available for payment source

## 2020-04-30 ENCOUNTER — Other Ambulatory Visit: Payer: Self-pay

## 2020-04-30 ENCOUNTER — Telehealth: Payer: Self-pay | Admitting: Emergency Medicine

## 2020-04-30 DIAGNOSIS — J4540 Moderate persistent asthma, uncomplicated: Secondary | ICD-10-CM

## 2020-04-30 MED ORDER — OMALIZUMAB 150 MG/ML ~~LOC~~ SOSY
150.0000 mg | PREFILLED_SYRINGE | Freq: Once | SUBCUTANEOUS | Status: AC
  Administered 2020-04-30: 150 mg via SUBCUTANEOUS

## 2020-04-30 NOTE — Telephone Encounter (Signed)
Patient came into office for injections. She already gets her injections at home from [facility] and she is very interested in doing at home injections instead of coming to office. Sent message to RB once we hear back we will watch demonstration at next injection and sign off for her to inject at home if patient administers safely  Dr. Ileene Musa please advise

## 2020-04-30 NOTE — Progress Notes (Signed)
Have you been hospitalized within the last 10 days?  No Do you have a fever?  No Do you have a cough?  No Do you have a headache or sore throat? No Do you have your Epi Pen visible and is it within date?  Yes 

## 2020-05-02 NOTE — Telephone Encounter (Signed)
Yes I would be ok with this

## 2020-05-07 NOTE — Telephone Encounter (Signed)
Pharmacy team will initiate Xolair home injections BIV.

## 2020-05-08 NOTE — Telephone Encounter (Signed)
Patient receives already Xolair syringes from pharmacy. No new auth needed- approved through 10/11/20. Patient can be trained at next visit and have medication sent to her home moving forward.

## 2020-05-08 NOTE — Telephone Encounter (Signed)
Noted. Will train Patient at next injection appointment, scheduled 05/28/20.

## 2020-05-17 ENCOUNTER — Telehealth: Payer: Self-pay | Admitting: *Deleted

## 2020-05-17 ENCOUNTER — Encounter: Payer: Self-pay | Admitting: *Deleted

## 2020-05-17 NOTE — Telephone Encounter (Signed)
Pt was put on Provera to induce a period, she took it for 14 days and had a period from 9/14-18. She is now having heavy spotting, enough that she needs to wear a pad or her menstrual cup. She wanted to know if this is normal after provera or if she needs to do anything else. Advised I would send this message to a provider and let her know.

## 2020-05-17 NOTE — Telephone Encounter (Signed)
Since she had gone so long with no menses, some irregularity after taking the provera is not too unexpected  Time will tell if she backs on track or not

## 2020-05-19 ENCOUNTER — Telehealth: Payer: No Typology Code available for payment source | Admitting: Nurse Practitioner

## 2020-05-19 DIAGNOSIS — R112 Nausea with vomiting, unspecified: Secondary | ICD-10-CM

## 2020-05-19 MED ORDER — PROMETHAZINE HCL 25 MG PO TABS: 25.0000 mg | ORAL_TABLET | Freq: Three times a day (TID) | ORAL | 0 refills | Status: DC | PRN

## 2020-05-19 MED FILL — PROMETHAZINE 25 MG TABLET: 25 | 6 days supply | Qty: 20 | Fill #0

## 2020-05-19 NOTE — Progress Notes (Signed)
We are sorry that you are not feeling well. Here is how we plan to help!  Based on what you have shared with me it looks like you have a Virus that is irritating your GI tract.  Vomiting is the forceful emptying of a portion of the stomach's content through the mouth.  Although nausea and vomiting can make you feel miserable, it's important to remember that these are not diseases, but rather symptoms of an underlying illness.  When we treat short term symptoms, we always caution that any symptoms that persist should be fully evaluated in a medical office.  I have prescribed a medication that will help alleviate your symptoms and allow you to stay hydrated:  Promethazine 25 mg take 1 tablet twice daily  HOME CARE:  Drink clear liquids.  This is very important! Dehydration (the lack of fluid) can lead to a serious complication.  Start off with 1 tablespoon every 5 minutes for 8 hours.  You may begin eating bland foods after 8 hours without vomiting.  Start with saltine crackers, white bread, rice, mashed potatoes, applesauce.  After 48 hours on a bland diet, you may resume a normal diet.  Try to go to sleep.  Sleep often empties the stomach and relieves the need to vomit.  GET HELP RIGHT AWAY IF:   Your symptoms do not improve or worsen within 2 days after treatment.  You have a fever for over 3 days.  You cannot keep down fluids after trying the medication.  MAKE SURE YOU:   Understand these instructions.  Will watch your condition.  Will get help right away if you are not doing well or get worse.   Thank you for choosing an e-visit. Your e-visit answers were reviewed by a board certified advanced clinical practitioner to complete your personal care plan. Depending upon the condition, your plan could have included both over the counter or prescription medications. Please review your pharmacy choice. Be sure that the pharmacy you have chosen is open so that you can pick up your  prescription now.  If there is a problem you may message your provider in [place] to have the prescription routed to another pharmacy. Your safety is important to [place]. If you have drug allergies check your prescription carefully.  For the next 24 hours, you can use MyChart to ask questions about today's visit, request a non-urgent call back, or ask for a work or school excuse from your e-visit provider. You will get an e-mail in the next two days asking about your experience. I hope that your e-visit has been valuable and will speed your recovery.  5-10 minutes spent reviewing and documenting in chart.

## 2020-05-24 MED FILL — XOLAIR 150 MG/ML SOSY: 150 | 28 days supply | Qty: 2 | Fill #8

## 2020-05-28 ENCOUNTER — Ambulatory Visit (INDEPENDENT_AMBULATORY_CARE_PROVIDER_SITE_OTHER): Payer: No Typology Code available for payment source

## 2020-05-28 ENCOUNTER — Other Ambulatory Visit: Payer: Self-pay

## 2020-05-28 DIAGNOSIS — J4540 Moderate persistent asthma, uncomplicated: Secondary | ICD-10-CM

## 2020-05-28 MED ORDER — OMALIZUMAB 150 MG/ML ~~LOC~~ SOSY
300.0000 mg | PREFILLED_SYRINGE | Freq: Once | SUBCUTANEOUS | Status: AC
  Administered 2020-05-28: 300 mg via SUBCUTANEOUS

## 2020-05-28 NOTE — Progress Notes (Signed)
Have you been hospitalized within the last 10 days?  No Do you have a fever?  No Do you have a cough?  No Do you have a headache or sore throat? No Do you have your Epi Pen visible and is it within date?  yes

## 2020-06-04 MED FILL — MEDROXYPROGESTERONE 10 MG T: 10 | 14 days supply | Qty: 14 | Fill #1

## 2020-06-04 MED FILL — LEVOCETIRIZINE 5 MG TABLET: 5 | 30 days supply | Qty: 30 | Fill #5

## 2020-06-04 MED FILL — MONTELUKAST SOD 10 MG TAB: 10 | 30 days supply | Qty: 30 | Fill #3

## 2020-06-07 MED ORDER — PREDNISONE 10 MG PO TABS: ORAL_TABLET | ORAL | 0 refills | Status: DC

## 2020-06-07 NOTE — Telephone Encounter (Signed)
Called and spoke with pt letting her know that [place] wanted [place] to send Rx for pred taper to pharmacy for her and she verbalized understanding. Verified preferred pharmacy and sent Rx in for pt. Nothing further needed.

## 2020-06-07 NOTE — Telephone Encounter (Signed)
Please give her a prednisone taper:  Take 40mg  daily for 3 days, then 30mg  daily for 3 days, then 20mg  daily for 3 days, then 10mg  daily for 3 days, then stop

## 2020-06-07 NOTE — Telephone Encounter (Signed)
Dr. Lamonte Sakai, please see mychart message from pt and advise.

## 2020-06-08 ENCOUNTER — Other Ambulatory Visit (HOSPITAL_COMMUNITY): Payer: Self-pay | Admitting: Physician Assistant

## 2020-06-08 ENCOUNTER — Encounter: Payer: Self-pay | Admitting: Physician Assistant

## 2020-06-08 NOTE — Telephone Encounter (Signed)
Called pt and explained possible monoclonal antibody treatment. Sx started 10/20. Tested positive 10/20 at [facility]. Sx includes minor SOB, nasal congestion, no taste and smell, cough, sore throat, headache, body aches, fatigue, and temperature. Qualifying risk factors severe asthma, reactive airway disease, and BMI 28.7. Pt interested in tx. Informed pt an APP will call back to possibly schedule an appointment.

## 2020-06-08 NOTE — Progress Notes (Signed)
I connected by phone with Tammy Chapman on 06/08/2020 at 4:56 PM to discuss the potential use of a new treatment for mild to moderate COVID-19 viral infection in non-hospitalized patients.  This patient is a 29 y.o. female that meets the FDA criteria for Emergency Use Authorization of COVID monoclonal antibody casirivimab/imdevimab or bamlanivimab/eteseviamb.  Has a (+) direct SARS-CoV-2 viral test result  Has mild or moderate COVID-19   Is NOT hospitalized due to COVID-19  Is within 10 days of symptom onset  Has at least one of the high risk factor(s) for progression to severe COVID-19 and/or hospitalization as defined in EUA.  Specific high risk criteria : BMI > 25 and Chronic Lung Disease   I have spoken and communicated the following to the patient or parent/caregiver regarding COVID monoclonal antibody treatment:  1. FDA has authorized the emergency use for the treatment of mild to moderate COVID-19 in adults and pediatric patients with positive results of direct SARS-CoV-2 viral testing who are 85 years of age and older weighing at least 40 kg, and who are at high risk for progressing to severe COVID-19 and/or hospitalization.  2. The significant known and potential risks and benefits of COVID monoclonal antibody, and the extent to which such potential risks and benefits are unknown.  3. Information on available alternative treatments and the risks and benefits of those alternatives, including clinical trials.  4. Patients treated with COVID monoclonal antibody should continue to self-isolate and use infection control measures (e.g., wear mask, isolate, social distance, avoid sharing personal items, clean and disinfect "high touch" surfaces, and frequent handwashing) according to CDC guidelines.   5. The patient or parent/caregiver has the option to accept or refuse COVID monoclonal antibody treatment.  After reviewing this information with the patient, the patient has agreed to  receive one of the available covid 19 monoclonal antibodies and will be provided an appropriate fact sheet prior to infusion. Konrad Felix, PA-C 06/08/2020 4:56 PM

## 2020-06-09 ENCOUNTER — Other Ambulatory Visit (HOSPITAL_COMMUNITY): Payer: Self-pay

## 2020-06-09 ENCOUNTER — Ambulatory Visit (HOSPITAL_COMMUNITY)
Admission: RE | Admit: 2020-06-09 | Discharge: 2020-06-09 | Disposition: A | Discharge status: Home / Self Care | Payer: No Typology Code available for payment source | Source: Ambulatory Visit | Attending: Pediatrics | Admitting: Pediatrics

## 2020-06-09 DIAGNOSIS — U071 COVID-19: Secondary | ICD-10-CM | POA: Insufficient documentation

## 2020-06-09 MED ORDER — FAMOTIDINE IN NACL 20-0.9 MG/50ML-% IV SOLN: 20.0000 mg | Freq: Once | INTRAVENOUS | Status: DC | PRN

## 2020-06-09 MED ORDER — METHYLPREDNISOLONE SODIUM SUCC 125 MG IJ SOLR: 125.0000 mg | Freq: Once | INTRAMUSCULAR | Status: DC | PRN

## 2020-06-09 MED ORDER — SODIUM CHLORIDE 0.9 % IV SOLN: Freq: Once | INTRAVENOUS | Status: AC

## 2020-06-09 MED ORDER — ALBUTEROL SULFATE HFA 108 (90 BASE) MCG/ACT IN AERS: 2.0000 | INHALATION_SPRAY | Freq: Once | RESPIRATORY_TRACT | Status: DC | PRN

## 2020-06-09 MED ORDER — EPINEPHRINE 0.3 MG/0.3ML IJ SOAJ: 0.3000 mg | Freq: Once | INTRAMUSCULAR | Status: DC | PRN

## 2020-06-09 MED ORDER — SODIUM CHLORIDE 0.9 % IV SOLN: INTRAVENOUS | Status: DC | PRN

## 2020-06-09 NOTE — Discharge Instructions (Signed)

## 2020-06-09 NOTE — Progress Notes (Signed)
  Diagnosis: COVID-19  Physician:Dr Joya Gaskins   Procedure: [facility] Infusion Clinic Med: bamlanivimab\etesevimab infusion - Provided patient with bamlanimivab\etesevimab fact sheet for patients, parents and caregivers prior to infusion.  Complications: No immediate complications noted.  Discharge: Discharged home   Dewaine Oats 06/09/2020

## 2020-06-10 ENCOUNTER — Telehealth: Payer: No Typology Code available for payment source | Admitting: Nurse Practitioner

## 2020-06-10 DIAGNOSIS — R0602 Shortness of breath: Secondary | ICD-10-CM

## 2020-06-10 DIAGNOSIS — U099 Post covid-19 condition, unspecified: Secondary | ICD-10-CM | POA: Diagnosis not present

## 2020-06-10 MED ORDER — AZITHROMYCIN 250 MG PO TABS: ORAL_TABLET | ORAL | 0 refills | Status: DC

## 2020-06-10 MED ORDER — ALBUTEROL SULFATE HFA 108 (90 BASE) MCG/ACT IN AERS: 2.0000 | INHALATION_SPRAY | Freq: Four times a day (QID) | RESPIRATORY_TRACT | 0 refills | Status: DC | PRN

## 2020-06-10 NOTE — Progress Notes (Signed)
E-Visit for Corona Virus Screening  We are sorry you are not feeling well. We are here to help!  You have tested positive for COVID-19, meaning that you were infected with the novel coronavirus and could give the germ to others.    You have been enrolled in [facility] for COVID-19. Daily you will receive a questionnaire within the [facility] website. Our COVID-19 response team will be monitoring your responses daily.  Please continue isolation at home, for at least 10 days since the start of your symptoms and until you have had 24 hours with no fever (without taking a fever reducer) and with improving of symptoms.  Please continue good preventive care measures, including:  frequent hand-washing, avoid touching your face, cover coughs/sneezes, stay out of crowds and keep a 6 foot distance from others.  Follow up with your provider or go to the nearest hospital ED for re-assessment if fever/cough/breathlessness return.  The following symptoms may appear 2-14 days after exposure: . Fever . Cough . Shortness of breath or difficulty breathing . Chills . Repeated shaking with chills . Muscle pain . Headache . Sore throat . New loss of taste or smell . Fatigue . Congestion or runny nose . Nausea or vomiting . Diarrhea  Go to the nearest hospital ED for assessment if fever/cough/breathlessness are severe or illness seems like a threat to life.  It is vitally important that if you feel that you have an infection such as this virus or any other virus that you stay home and away from places where you may spread it to others.  You should avoid contact with people age 73 and older.   You can use medication such as A prescription inhaler called Albuterol MDI 90 mcg /actuation 2 puffs every 4 hours as needed for shortness of breath, wheezing, cough  And z pak as directed  You may also take acetaminophen (Tylenol) as needed for fever.  Reduce your risk of any infection by using the same  precautions used for avoiding the common cold or flu:  Marland Kitchen Wash your hands often with soap and warm water for at least 20 seconds.  If soap and water are not readily available, use an alcohol-based hand sanitizer with at least 60% alcohol.  . If coughing or sneezing, cover your mouth and nose by coughing or sneezing into the elbow areas of your shirt or coat, into a tissue or into your sleeve (not your hands). . Avoid shaking hands with others and consider head nods or verbal greetings only. . Avoid touching your eyes, nose, or mouth with unwashed hands.  . Avoid close contact with people who are sick. . Avoid places or events with large numbers of people in one location, like concerts or sporting events. . Carefully consider travel plans you have or are making. . If you are planning any travel outside or inside the [place], visit the CDC's Travelers' Health webpage for the latest health notices. . If you have some symptoms but not all symptoms, continue to monitor at home and seek medical attention if your symptoms worsen. . If you are having a medical emergency, call 911.  HOME CARE . Only take medications as instructed by your medical team. . Drink plenty of fluids and get plenty of rest. . A steam or ultrasonic humidifier can help if you have congestion.   GET HELP RIGHT AWAY IF YOU HAVE EMERGENCY WARNING SIGNS** FOR COVID-19. If you or someone is showing any of these signs seek  emergency medical care immediately. Call 911 or proceed to your closest emergency facility if: . You develop worsening high fever. . Trouble breathing . Bluish lips or face . Persistent pain or pressure in the chest . New confusion . Inability to wake or stay awake . You cough up blood. . Your symptoms become more severe .        Inability to hold down food or fluids **This list is not all possible symptoms. Contact your medical provider for any symptoms that are sever or concerning to you.  MAKE SURE YOU    Understand these instructions.  Will watch your condition.  Will get help right away if you are not doing well or get worse.  Your e-visit answers were reviewed by a board certified advanced clinical practitioner to complete your personal care plan.  Depending on the condition, your plan could have included both over the counter or prescription medications.  If there is a problem please reply once you have received a response from your provider.  Your safety is important to [place].  If you have drug allergies check your prescription carefully.    You can use MyChart to ask questions about today's visit, request a non-urgent call back, or ask for a work or school excuse for 24 hours related to this e-Visit. If it has been greater than 24 hours you will need to follow up with your provider, or enter a new e-Visit to address those concerns. You will get an e-mail in the next two days asking about your experience.  I hope that your e-visit has been valuable and will speed your recovery. Thank you for using e-visits.   5-10 minutes spent reviewing and documenting in chart.

## 2020-06-11 NOTE — Telephone Encounter (Signed)
Called and spoke with the pt  She was dx with Covid 19 06/06/20  Called here following day and given pred taper- still taking this as directed  She states that she has developed a more productive cough over the past 3 days- moderate amounts of dark green sputum  She also c/o DOE and wheezing that started a couple of days before her dx  She is having body aches and thinks she has had a fever but has not physically checked her temp  She is still on her tessalon, tussionex, symbicort, albuterol  She has had the monoclonal antibody infusion  She believes she needs abx for possible PNA  Please advise, thanks!

## 2020-06-11 NOTE — Telephone Encounter (Signed)
06/11/2020  Patient was recently tested positive for Covid on 06/06/2020.    I am glad that she was able to receive the monoclonal antibody infusion.  Unfortunately there is likely not an indication for antibiotics at this point in time has this is likely not a bacterial process as she is tested positive for Covid.  Patient has active symptoms of Covid.  If symptoms should worsen she needs to seek emergent care and emergency room or urgent care.  Continue supportive measures such as rest, hydrating well, taking cough suppressants as able.  To clarify I have also reviewed the note from the ED visit the patient completed yesterday.  It looks like they did in fact prescribed azithromycin or Z-Pak.  This is an antibiotic.  If the patient would like she can go ahead and proceed forward with taking that at the recommendation of the provider that did the E visit. I have cced that provider as FYI.   We have no new recommendations at this time.  Wyn Quaker, FNP

## 2020-06-11 NOTE — Telephone Encounter (Signed)
Checked pt's med list and see that Rx for zpak was sent to [facility] in [place] off [address] 10/24. Called pharmacy and spoke with Armonie to see if Rx for Zpak is at pharmacy and she stated that it is there ready for pick up.

## 2020-06-21 MED FILL — XOLAIR 150 MG/ML SOSY: 150 | 28 days supply | Qty: 2 | Fill #9

## 2020-07-19 MED FILL — XOLAIR 150 MG/ML SOSY: 150 | 28 days supply | Qty: 2 | Fill #10

## 2020-08-01 ENCOUNTER — Other Ambulatory Visit: Payer: Self-pay | Admitting: Emergency Medicine

## 2020-08-01 MED FILL — LEVOCETIRIZINE 5 MG TABLET: 5 | 30 days supply | Qty: 30 | Fill #0

## 2020-08-01 MED FILL — SYMBICORT 160-4.5 MCG INH: 160-4.5 | 30 days supply | Qty: 10 | Fill #0

## 2020-08-01 MED FILL — MONTELUKAST SOD 10 MG TAB: 10 | 30 days supply | Qty: 30 | Fill #4

## 2020-08-01 MED FILL — MEDROXYPROGESTERONE 10 MG T: 10 | 14 days supply | Qty: 14 | Fill #2

## 2020-08-13 MED FILL — XOLAIR 150 MG/ML SOSY: 150 | 28 days supply | Qty: 2 | Fill #11

## 2020-08-24 MED ORDER — PREDNISONE 10 MG PO TABS: ORAL_TABLET | ORAL | 0 refills | Status: DC

## 2020-08-24 NOTE — Telephone Encounter (Signed)
Called and spoke with patient. She verbalized understanding of recommendations. She wants to have the prednisone sent to [facility]. Will go ahead and send in RX. She is aware to call us if anything changes.

## 2020-08-24 NOTE — Telephone Encounter (Signed)
PLease have her take pred taper as below.  Also try using delsym for cough, an OTC decongestant like Tylenol Cold&Flu for other symptoms.   Pred >> Take 40mg  daily for 3 days, then 30mg  daily for 3 days, then 20mg  daily for 3 days, then 10mg  daily for 3 days, then stop

## 2020-08-24 NOTE — Telephone Encounter (Signed)
Patient sent email   Hey Dr Lamonte Sakai,   I am writing you today because I think I have bronchitis. It has torn through my house over the holidays and finally decided to jump on me this week. 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 I am so hoarse and spend 99% of the day coughing. I cough all day, and it's making my throat and chest so sore. I don't wanna use my tussinex during the day because it makes me sleepy. I also feel like it's exacerbating my asthma and as a result I'm having to use my rescue inhaler about every 4 hours. Could you please send me in something to control my cough during the day and some prednisone? If you have any questions, you can reach out to me through my desk phone, [phone].   Thanks  Tammy Chapman    Dr. Lamonte Sakai please advise.

## 2020-09-10 ENCOUNTER — Encounter: Payer: Self-pay | Admitting: Emergency Medicine

## 2020-09-10 ENCOUNTER — Encounter (INDEPENDENT_AMBULATORY_CARE_PROVIDER_SITE_OTHER): Payer: Self-pay

## 2020-09-10 ENCOUNTER — Telehealth: Payer: Self-pay | Admitting: Emergency Medicine

## 2020-09-10 DIAGNOSIS — U071 COVID-19: Secondary | ICD-10-CM

## 2020-09-10 MED ORDER — DOXYCYCLINE HYCLATE 100 MG PO TABS: 100.0000 mg | ORAL_TABLET | Freq: Two times a day (BID) | ORAL | 0 refills | Status: DC

## 2020-09-10 MED ORDER — PREDNISONE 10 MG PO TABS: ORAL_TABLET | ORAL | 0 refills | Status: DC

## 2020-09-10 NOTE — Telephone Encounter (Signed)
09/10/20  09/09/20 - symptom onset - cough, sore throat, hoarse, fever, body aches chills, gi symptoms   09/09/20 - Sarscov2 - positive (with [facility])  Unvaccinated - religious exemption   30 year old female followed in our office for asthma.  Patient contacted our office today reporting she was exposed to COVID-19 and started developing symptoms on 09/09/2020.  She tested positive for SARS-CoV-2 with [facility] on 09/09/2020.  She is a Adult nurse.  She is currently unvaccinated due to religious reasons.  Patient reporting worsening symptoms of cough, wheezing, shortness of breath.  She remains adherent to her maintenance inhalers.  She reports that she she is having fevers as well as thick purulent yellow to green nasal drainage.  She has increased sinus pressure.  Plan: Referral to monoclonal antibody infusion team to be evaluated  Doxycycline today  Prednisone taper today  Close follow-up scheduled in our office on 10/01/2020 at 4:30 PM  Continue pulmonary medications next Reviewed if acute symptoms worsen she needs to seek emergent evaluation  Nothing further needed.   Wyn Quaker FNP

## 2020-09-10 NOTE — Telephone Encounter (Signed)
09/10/2020  Attempted to reach patient.  Left voicemail.  If patient calls back would schedule for telephone visit to further review symptoms and decide plan of care.  Wyn Quaker FNP

## 2020-09-11 ENCOUNTER — Telehealth: Payer: Self-pay | Admitting: Pharmacy Technician

## 2020-09-11 ENCOUNTER — Other Ambulatory Visit: Payer: Self-pay | Admitting: Physician Assistant

## 2020-09-11 DIAGNOSIS — J4542 Moderate persistent asthma with status asthmaticus: Secondary | ICD-10-CM

## 2020-09-11 DIAGNOSIS — U071 COVID-19: Secondary | ICD-10-CM

## 2020-09-11 DIAGNOSIS — J411 Mucopurulent chronic bronchitis: Secondary | ICD-10-CM

## 2020-09-11 NOTE — Telephone Encounter (Signed)
Submitted a Prior Authorization request to [facility] for Arvid Right via Cover My Meds. Will update once we receive a response.   (Key: [id number])

## 2020-09-11 NOTE — Progress Notes (Signed)
I connected by phone with Tammy Chapman on 09/11/2020 at 11:08 AM to discuss the potential use of a new treatment for mild to moderate COVID-19 viral infection in non-hospitalized patients.  This patient is a 30 y.o. female that meets the FDA criteria for Emergency Use Authorization of COVID monoclonal antibody sotrovimab.  Has a (+) direct SARS-CoV-2 viral test result  Has mild or moderate COVID-19   Is NOT hospitalized due to COVID-19  Is within 10 days of symptom onset  Has at least one of the high risk factor(s) for progression to severe COVID-19 and/or hospitalization as defined in EUA.  Specific high risk criteria : BMI > 25 and Chronic Lung Disease   I have spoken and communicated the following to the patient or parent/caregiver regarding COVID monoclonal antibody treatment:  1. FDA has authorized the emergency use for the treatment of mild to moderate COVID-19 in adults and pediatric patients with positive results of direct SARS-CoV-2 viral testing who are 42 years of age and older weighing at least 40 kg, and who are at high risk for progressing to severe COVID-19 and/or hospitalization.  2. The significant known and potential risks and benefits of COVID monoclonal antibody, and the extent to which such potential risks and benefits are unknown.  3. Information on available alternative treatments and the risks and benefits of those alternatives, including clinical trials.  4. Patients treated with COVID monoclonal antibody should continue to self-isolate and use infection control measures (e.g., wear mask, isolate, social distance, avoid sharing personal items, clean and disinfect "high touch" surfaces, and frequent handwashing) according to CDC guidelines.   5. The patient or parent/caregiver has the option to accept or refuse COVID monoclonal antibody treatment.  After reviewing this information with the patient, the patient has agreed to receive one of the available covid 19  monoclonal antibodies and will be provided an appropriate fact sheet prior to infusion.   [place], [place] 09/11/2020 11:08 AM

## 2020-09-12 ENCOUNTER — Ambulatory Visit (HOSPITAL_COMMUNITY): Payer: No Typology Code available for payment source

## 2020-09-14 ENCOUNTER — Ambulatory Visit (HOSPITAL_COMMUNITY)
Admission: RE | Admit: 2020-09-14 | Discharge: 2020-09-14 | Disposition: A | Discharge status: Home / Self Care | Payer: Self-pay | Source: Ambulatory Visit | Attending: Pulmonary Disease | Admitting: Pulmonary Disease

## 2020-09-14 DIAGNOSIS — U071 COVID-19: Secondary | ICD-10-CM | POA: Insufficient documentation

## 2020-09-14 MED ORDER — SOTROVIMAB 500 MG/8ML IV SOLN
500.0000 mg | Freq: Once | INTRAVENOUS | Status: AC
  Administered 2020-09-14: 500 mg via INTRAVENOUS

## 2020-09-14 MED ORDER — EPINEPHRINE 0.3 MG/0.3ML IJ SOAJ: 0.3000 mg | Freq: Once | INTRAMUSCULAR | Status: DC | PRN

## 2020-09-14 MED ORDER — SODIUM CHLORIDE 0.9 % IV SOLN: INTRAVENOUS | Status: DC | PRN

## 2020-09-14 MED ORDER — METHYLPREDNISOLONE SODIUM SUCC 125 MG IJ SOLR: 125.0000 mg | Freq: Once | INTRAMUSCULAR | Status: DC | PRN

## 2020-09-14 MED ORDER — ALBUTEROL SULFATE HFA 108 (90 BASE) MCG/ACT IN AERS: 2.0000 | INHALATION_SPRAY | Freq: Once | RESPIRATORY_TRACT | Status: DC | PRN

## 2020-09-14 MED ORDER — FAMOTIDINE IN NACL 20-0.9 MG/50ML-% IV SOLN: 20.0000 mg | Freq: Once | INTRAVENOUS | Status: DC | PRN

## 2020-09-14 MED ORDER — DIPHENHYDRAMINE HCL 50 MG/ML IJ SOLN: 50.0000 mg | Freq: Once | INTRAMUSCULAR | Status: DC | PRN

## 2020-09-14 NOTE — Progress Notes (Signed)
Diagnosis: COVID-19  Physician: Dr. Patrick Wright  Procedure: Covid Infusion Clinic Med: Sotrovimab infusion - Provided patient with sotrovimab fact sheet for patients, parents, and caregivers prior to infusion.   Complications: No immediate complications noted  Discharge: Discharged home    

## 2020-09-14 NOTE — Progress Notes (Signed)
Patient reviewed Fact Sheet for Patients, Parents, and Caregivers for Emergency Use Authorization (EUA) of sotrovimab for the Treatment of Coronavirus. Patient also reviewed and is agreeable to the estimated cost of treatment. Patient is agreeable to proceed.   

## 2020-09-14 NOTE — Discharge Instructions (Signed)

## 2020-09-19 NOTE — Telephone Encounter (Signed)
Received notification from [facility] regarding a prior authorization for XOLAIR. Authorization has been APPROVED from 09/12/20 to 09/11/21.    Phone # [phone]

## 2020-09-21 ENCOUNTER — Telehealth: Payer: Self-pay | Admitting: Physician Assistant

## 2020-09-21 DIAGNOSIS — B001 Herpesviral vesicular dermatitis: Secondary | ICD-10-CM

## 2020-09-21 DIAGNOSIS — Z01812 Encounter for preprocedural laboratory examination: Secondary | ICD-10-CM | POA: Diagnosis not present

## 2020-09-21 DIAGNOSIS — Z20822 Contact with and (suspected) exposure to covid-19: Secondary | ICD-10-CM | POA: Diagnosis not present

## 2020-09-21 MED ORDER — VALACYCLOVIR HCL 1 G PO TABS
2000.0000 mg | ORAL_TABLET | Freq: Two times a day (BID) | ORAL | 0 refills | Status: DC
Start: 2020-09-21 — End: 2020-09-25

## 2020-09-21 NOTE — Progress Notes (Signed)
I have spent 5 minutes in review of e-visit questionnaire, review and updating patient chart, medical decision making and response to patient.   Kentaro Alewine Cody Birgit Nowling, PA-C    

## 2020-09-21 NOTE — Progress Notes (Signed)
We are sorry that you are not feeling well.  Here is how we plan to help!  Based on what you have shared with me it does look like you have a viral infection.    Most cold sores or fever blisters are small fluid filled blisters around the mouth caused by herpes simplex virus.  The most common strain of the virus causing cold sores is herpes simplex virus 1.  It can be spread by skin contact, sharing eating utensils, or even sharing towels.  Cold sores are contagious to other people until dry. (Approximately 5-7 days).  Wash your hands. You can spread the virus to your eyes through handling your contact lenses after touching the lesions.  Most people experience pain at the sight or tingling sensations in their lips that may begin before the ulcers erupt.  Herpes simplex is treatable but not curable.  It may lie dormant for a long time and then reappear due to stress or prolonged sun exposure.  Many patients have success in treating their cold sores with an over the counter topical called Abreva.  You may apply the cream up to 5 times daily (maximum 10 days) until healing occurs.  If you would like to use an oral antiviral medication to speed the healing of your cold sore, I have sent a prescription to your local pharmacy Valacyclovir 2 gm take one by mouth twice a day for 1 day . I think it flared due to [place] and is also taking longer to heal as your immune system is busy trying to help you get over [place] itself. If symptoms are not resolving over the weekend with the medication given, I recommend an in-person assessment.   HOME CARE:   Wash your hands frequently.  Do not pick at or rub the sore.  Don't open the blisters.  Avoid kissing other people during this time.  Avoid sharing drinking glasses, eating utensils, or razors.  Do not handle contact lenses unless you have thoroughly washed your hands with soap and warm water!  Avoid oral sex during this time.  Herpes from sores on your  mouth can spread to your partner's genital area.  Avoid contact with anyone who has eczema or a weakened immune system.  Cold sores are often triggered by exposure to intense sunlight, use a lip balm containing a sunscreen (SPF 30 or higher).  GET HELP RIGHT AWAY IF:   Blisters look infected.  Blisters occur near or in the eye.  Symptoms last longer than 10 days.  Your symptoms become worse.  MAKE SURE YOU:   Understand these instructions.  Will watch your condition.  Will get help right away if you are not doing well or get worse.    Your e-visit answers were reviewed by a board certified advanced clinical practitioner to complete your personal care plan.  Depending upon the condition, your plan could have  Included both over the counter or prescription medications.    Please review your pharmacy choice.  Be sure that the pharmacy you have chosen is open so that you can pick up your prescription now.  If there is a problem you can message your provider in [place] to have the prescription routed to another pharmacy.    Your safety is important to [place].  If you have drug allergies check our prescription carefully.  For the next 24 hours you can use MyChart to ask questions about today's visit, request a non-urgent call back, or ask for a  work or school excuse from your e-visit provider.  You will get an email in the next two days asking about your experience.  I hope that your e-visit has been valuable and will speed your recovery.

## 2020-09-24 DIAGNOSIS — Z881 Allergy status to other antibiotic agents status: Secondary | ICD-10-CM | POA: Diagnosis not present

## 2020-09-24 DIAGNOSIS — H6693 Otitis media, unspecified, bilateral: Secondary | ICD-10-CM | POA: Diagnosis not present

## 2020-09-24 DIAGNOSIS — Z880 Allergy status to penicillin: Secondary | ICD-10-CM | POA: Diagnosis not present

## 2020-09-25 ENCOUNTER — Other Ambulatory Visit (HOSPITAL_COMMUNITY): Payer: Self-pay | Admitting: Gastroenterology

## 2020-09-25 ENCOUNTER — Encounter: Payer: Self-pay | Admitting: Gastroenterology

## 2020-09-25 ENCOUNTER — Ambulatory Visit (INDEPENDENT_AMBULATORY_CARE_PROVIDER_SITE_OTHER): Payer: 59 | Admitting: Gastroenterology

## 2020-09-25 ENCOUNTER — Other Ambulatory Visit (INDEPENDENT_AMBULATORY_CARE_PROVIDER_SITE_OTHER): Payer: 59

## 2020-09-25 VITALS — BP 122/82 | HR 100 | Ht 70.0 in | Wt 217.0 lb

## 2020-09-25 DIAGNOSIS — R1084 Generalized abdominal pain: Secondary | ICD-10-CM | POA: Diagnosis not present

## 2020-09-25 DIAGNOSIS — K529 Noninfective gastroenteritis and colitis, unspecified: Secondary | ICD-10-CM | POA: Diagnosis not present

## 2020-09-25 LAB — CBC WITH DIFFERENTIAL/PLATELET
Basophils Absolute: 0 10*3/uL (ref 0.0–0.1)
Basophils Relative: 0.4 % (ref 0.0–3.0)
Eosinophils Absolute: 0.1 10*3/uL (ref 0.0–0.7)
Eosinophils Relative: 0.8 % (ref 0.0–5.0)
HCT: 43.3 % (ref 36.0–46.0)
Hemoglobin: 14.2 g/dL (ref 12.0–15.0)
Lymphocytes Relative: 32 % (ref 12.0–46.0)
Lymphs Abs: 2.3 10*3/uL (ref 0.7–4.0)
MCHC: 32.9 g/dL (ref 30.0–36.0)
MCV: 81.2 fl (ref 78.0–100.0)
Monocytes Absolute: 0.5 10*3/uL (ref 0.1–1.0)
Monocytes Relative: 7.3 % (ref 3.0–12.0)
Neutro Abs: 4.3 10*3/uL (ref 1.4–7.7)
Neutrophils Relative %: 59.5 % (ref 43.0–77.0)
Platelets: 225 10*3/uL (ref 150.0–400.0)
RBC: 5.33 Mil/uL — ABNORMAL HIGH (ref 3.87–5.11)
RDW: 14.9 % (ref 11.5–15.5)
WBC: 7.2 10*3/uL (ref 4.0–10.5)

## 2020-09-25 LAB — COMPREHENSIVE METABOLIC PANEL
ALT: 12 U/L (ref 0–35)
AST: 8 U/L (ref 0–37)
Albumin: 4.3 g/dL (ref 3.5–5.2)
Alkaline Phosphatase: 86 U/L (ref 39–117)
BUN: 9 mg/dL (ref 6–23)
CO2: 29 mEq/L (ref 19–32)
Calcium: 9.4 mg/dL (ref 8.4–10.5)
Chloride: 101 mEq/L (ref 96–112)
Creatinine, Ser: 0.75 mg/dL (ref 0.40–1.20)
GFR: 108.17 mL/min (ref >60.00)
Glucose, Bld: 93 mg/dL (ref 70–99)
Potassium: 3.7 mEq/L (ref 3.5–5.1)
Sodium: 136 mEq/L (ref 135–145)
Total Bilirubin: 0.6 mg/dL (ref 0.2–1.2)
Total Protein: 7.4 g/dL (ref 6.0–8.3)

## 2020-09-25 LAB — TSH: TSH: 0.84 u[IU]/mL (ref 0.35–4.50)

## 2020-09-25 MED ORDER — METOCLOPRAMIDE HCL 5 MG PO TABS: 5.0000 mg | ORAL_TABLET | Freq: Two times a day (BID) | ORAL | 0 refills | Status: DC | PRN

## 2020-09-25 MED ORDER — HYOSCYAMINE SULFATE 0.125 MG SL SUBL: 0.1250 mg | SUBLINGUAL_TABLET | Freq: Four times a day (QID) | SUBLINGUAL | 1 refills | Status: DC | PRN

## 2020-09-25 MED FILL — XOLAIR 150 MG/ML SOSY: 150 | 28 days supply | Qty: 2 | Fill #12

## 2020-09-25 MED FILL — OSCIMIN 0.125 MG SUBL: 0.125 | 7 days supply | Qty: 30 | Fill #0

## 2020-09-25 MED FILL — METOCLOPRAMIDE 5 MG TABLET: 5 | 1 days supply | Qty: 2 | Fill #0

## 2020-09-25 NOTE — Patient Instructions (Addendum)
If you are age 29 or older, your body mass index should be between 23-30. Your Body mass index is 31.14 kg/m. If this is out of the aforementioned range listed, please consider follow up with your Primary Care Provider.  If you are age 4 or younger, your body mass index should be between 19-25. Your Body mass index is 31.14 kg/m. If this is out of the aformentioned range listed, please consider follow up with your Primary Care Provider.   You have been scheduled for a colonoscopy. Please follow written instructions given to you at your visit today.  Please pick up your prep supplies at the pharmacy within the next 1-3 days. If you use inhalers (even only as needed), please bring them with you on the day of your procedure.  Your provider has requested that you go to the basement level for lab work before leaving today. Press "B" on the elevator. The lab is located at the first door on the left as you exit the elevator.  Due to recent changes in healthcare laws, you may see the results of your imaging and laboratory studies on MyChart before your provider has had a chance to review them.  We understand that in some cases there may be results that are confusing or concerning to you. Not all laboratory results come back in the same time frame and the provider may be waiting for multiple results in order to interpret others.  Please give [place] 48 hours in order for your provider to thoroughly review all the results before contacting the office for clarification of your results.   It was a pleasure to see you today!  Dr. Loletha Carrow

## 2020-09-25 NOTE — Progress Notes (Signed)
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 For about the last 10 years she has had generalized abdominal pain with episodic diarrhea that is severe at times and associated with fatigue and feelings of fever.  She was first seen by Dr.Rehman around age 22, upper endoscopy done, cholecystectomy performed without improvement in symptoms.  Last office visit with Dr. Laural Golden December 2012, describing intermittent chronic abdominal pain improved with dicyclomine and heartburn improved with Dexilant.  She cannot recall how long she took the dicyclomine but is certainly not been on it for years.  She has diarrhea more days than not, may have between 10-30 BMs with urgency on such days.  On those flares may last few days to a week and are associated with sharp crampy mid to lower abdominal pain.  She is not taking any particular medicines for these symptoms.  She rarely has dairy products and she was tested for a milk allergy.  She also says she had a wheat allergy but she has not had any formal celiac testing. [name] denies joint inflammation, rash or painful eye redness. No dysphagia odynophagia nausea or vomiting.  ROS:  Review of Systems  Constitutional: Negative for appetite change and unexpected weight change.  HENT: Negative for mouth sores and voice change.   Eyes: Negative for pain and redness.  Respiratory: Negative for cough and shortness of breath.   Cardiovascular: Negative for chest pain and palpitations.  Genitourinary: Negative for  dysuria and hematuria.  Musculoskeletal: Negative for arthralgias and myalgias.  Skin: Negative for pallor and rash.  Neurological: Negative for weakness and headaches.  Hematological: Negative for adenopathy.     Past Medical History: Past Medical History:  Diagnosis Date  . Asthma   . Bronchitis, mucopurulent recurrent ([place]) 01/19/2019  . Chronic asthma 01/19/2019  . Loose body of right knee 02/2013  . Pregnancy induced hypertension   . Recurrent otitis media of both ears 01/19/2019  . Sprain and strain of medial collateral ligament of knee 02/2013   right     Past Surgical History: Past Surgical History:  Procedure Laterality Date  . CESAREAN SECTION N/A 01/08/2017   Procedure: CESAREAN SECTION;  Surgeon: Jonnie Kind, MD;  Location: [facility];  Service: Obstetrics;  Laterality: N/A;  . CESAREAN SECTION N/A 07/01/2019   Procedure: REPEAT CESAREAN SECTION;  Surgeon: Jonnie Kind, MD;  Location: [facility];  Service: Obstetrics;  Laterality: N/A;  . CHOLECYSTECTOMY  01/20/2008  . EAR TUBE REMOVAL  2002  . ESOPHAGOGASTRODUODENOSCOPY  05/15/2011   Procedure: ESOPHAGOGASTRODUODENOSCOPY (EGD);  Surgeon: Rogene Houston, MD;  Location: [facility];  Service: Endoscopy;  Laterality: N/A;  3:15   . KNEE ARTHROSCOPY Right 03/15/2013   Procedure: RIGHT  KNEE ARTHROSCOPY, FEMORAL PATELLA REEFING, LATERAL RELEASE, PARTIAL LATERAL MENISCECTOMY ;  Surgeon: Lorn Junes, MD;  Location: [facility];  Service: Orthopedics;  Laterality: Right;  . TONSILLECTOMY AND ADENOIDECTOMY  1999  . TYMPANOSTOMY TUBE PLACEMENT  1999, 2011     Family History: Family History  Problem Relation Age of Onset  . Diabetes Mother   . Celiac disease Father   .  Renal cancer Paternal Grandfather   . Heart disease Paternal Grandfather   . Heart disease Paternal Grandmother   . Ulcerative colitis Maternal Grandmother   . Heart disease Maternal Grandmother   . Diabetes Maternal  Grandfather   . COPD Maternal Grandfather   . Congestive Heart Failure Maternal Grandfather   . Kidney disease Maternal Grandfather   . Kidney disease Maternal Uncle   . Other Daughter        milk and soy allergy  . Colon cancer Neg Hx   . Esophageal cancer Neg Hx   . Stomach cancer Neg Hx     Social History: Social History   Socioeconomic History  . Marital status: Married    Spouse name: Not on file  . Number of children: 1  . Years of education: Not on file  . Highest education level: Not on file  Occupational History  . Not on file  Tobacco Use  . Smoking status: Never Smoker  . Smokeless tobacco: Never Used  Vaping Use  . Vaping Use: Never used  Substance and Sexual Activity  . Alcohol use: No  . Drug use: No  . Sexual activity: Not Currently    Partners: Male    Birth control/protection: None  Other Topics Concern  . Not on file  Social History Narrative  . Not on file   Social Determinants of Health   Financial Resource Strain: Not on file  Food Insecurity: Not on file  Transportation Needs: Not on file  Physical Activity: Not on file  Stress: Not on file  Social Connections: Not on file    Allergies: Allergies  Allergen Reactions  . Benadryl [Diphenhydramine Hcl] Shortness Of Breath    Pt states that she feels fine if she sits down, but if she is moving around after taking benadryl, she feels SOB  . Penicillins Shortness Of Breath    Has patient had a PCN reaction causing immediate rash, facial/tongue/throat swelling, SOB or lightheadedness with hypotension: yes Has patient had a PCN reaction causing severe rash involving mucus membranes or skin necrosis: no Has patient had a PCN reaction that required hospitalization: no Has patient had a PCN reaction occurring within the last 10 years: no If all of the above answers are "NO", then may proceed with Cephalosporin use. Patient reports she CAN TAKE kEFLEX AND SUPRAX  . Adhesive [Tape] Hives  .  Percocet [Oxycodone-Acetaminophen]   . Lactose Intolerance (Gi) Diarrhea and Nausea And Vomiting  . Latex Rash  . Sulfa Antibiotics Rash  . Ultram [Tramadol Hcl] Rash    Outpatient Meds: Current Outpatient Medications  Medication Sig Dispense Refill  . albuterol (VENTOLIN HFA) 108 (90 Base) MCG/ACT inhaler Inhale 2 puffs into the lungs every 6 (six) hours as needed for wheezing or shortness of breath. 8 g 0  . chlorpheniramine-HYDROcodone (TUSSIONEX PENNKINETIC ER) 10-8 MG/5ML SUER Take 5 mLs by mouth every 12 (twelve) hours as needed for cough. 140 mL 0  . levocetirizine (XYZAL) 5 MG tablet TAKE 1 TABLET BY MOUTH EVERY EVENING. 30 tablet 5  . medroxyPROGESTERone (PROVERA) 10 MG tablet Take 1 tablet (10 mg total) by mouth daily. One tablet daily x 2 weeks. Repeat as directed 14 tablet 3  . montelukast (SINGULAIR) 10 MG tablet TAKE 1 TABLET BY MOUTH AT BEDTIME 30 tablet 10  . omalizumab (XOLAIR) 150 MG/ML prefilled syringe Inject 300 mg into the skin every 28 (twenty-eight) days. 2 mL 12  . PHEXXI 1.8-1-0.4 % GEL PLACE 1 APPLICATORFUL  VAGINALLY AS NEEDED. IMMEDIATELY BEFORE OR UP TO 1 HOUR BEFORE SEX 60 g 11  . SYMBICORT 160-4.5 MCG/ACT inhaler INHALE 2 PUFFS BY MOUTH TWICE DAILY 10.2 g 11   No current facility-administered medications for this visit.      ___________________________________________________________________ Objective   Exam:  BP 122/82   Pulse 100   Ht 5\' 10"  (1.778 m)   Wt 217 lb (98.4 kg)   LMP 09/05/2020 (Exact Date)   SpO2 98%   BMI 31.14 kg/m  Wt Readings from Last 3 Encounters:  09/25/20 217 lb (98.4 kg)  04/11/20 207 lb (93.9 kg)  04/11/20 207 lb (93.9 kg)     General: Well-appearing  Eyes: sclera anicteric, no redness  ENT: oral mucosa moist without lesions, no cervical or supraclavicular lymphadenopathy  CV: RRR without murmur, S1/S2, no JVD, no peripheral edema  Resp: clear to auscultation bilaterally, normal RR and effort noted  GI:  soft, mild generalized tenderness, with active bowel sounds. No guarding or palpable organomegaly noted.  Skin; warm and dry, no rash or jaundice noted  Neuro: awake, alert and oriented x 3. Normal gross motor function and fluent speech  Labs:  No blood testing in epic since December 2020  None  Assessment: Encounter Diagnoses  Name Primary?  . Generalized abdominal pain Yes  . Chronic diarrhea     Overall, symptoms most consistent with IBS, less likely IBD, less likely celiac sprue.  Symptoms predate cholecystectomy, and the degree of abdominal pain would be greater than expected for bile acid diarrhea.  No apparent risk factors for SIBO  Plan:  CBC, CMP, TSH, TTG IgA antibody and total IgA level today  Colonoscopy.  She was agreeable after discussion of procedure and risks.  The benefits and risks of the planned procedure were described in detail with the patient or (when appropriate) their health care proxy.  Risks were outlined as including, but not limited to, bleeding, infection, perforation, adverse medication reaction leading to cardiac or pulmonary decompensation, pancreatitis (if ERCP).  The limitation of incomplete mucosal visualization was also discussed.  No guarantees or warranties were given.  (2 doses of metoclopramide prescribed to take before evening and a.m. bowel prep)  Trial of hyoscyamine to take as needed for cramps and diarrhea.  Thank you for the courtesy of this consult.  Please call me with any questions or concerns.  Nelida Meuse III  CC: Referring provider noted above

## 2020-09-26 LAB — IGA: Immunoglobulin A: 149 mg/dL (ref 47–310)

## 2020-09-26 LAB — TISSUE TRANSGLUTAMINASE, IGA: (tTG) Ab, IgA: <1 U/mL

## 2020-10-01 ENCOUNTER — Other Ambulatory Visit: Payer: Self-pay

## 2020-10-01 ENCOUNTER — Encounter: Payer: Self-pay | Admitting: Pulmonary Disease

## 2020-10-01 ENCOUNTER — Ambulatory Visit (INDEPENDENT_AMBULATORY_CARE_PROVIDER_SITE_OTHER): Payer: 59

## 2020-10-01 ENCOUNTER — Ambulatory Visit (INDEPENDENT_AMBULATORY_CARE_PROVIDER_SITE_OTHER): Payer: 59 | Admitting: Pulmonary Disease

## 2020-10-01 VITALS — BP 120/80 | HR 72 | Temp 97.8°F | Ht 70.0 in | Wt 215.0 lb

## 2020-10-01 DIAGNOSIS — J4540 Moderate persistent asthma, uncomplicated: Secondary | ICD-10-CM

## 2020-10-01 DIAGNOSIS — R0600 Dyspnea, unspecified: Secondary | ICD-10-CM | POA: Diagnosis not present

## 2020-10-01 DIAGNOSIS — U071 COVID-19: Secondary | ICD-10-CM

## 2020-10-01 DIAGNOSIS — J189 Pneumonia, unspecified organism: Secondary | ICD-10-CM | POA: Diagnosis not present

## 2020-10-01 MED ORDER — SPIRIVA RESPIMAT 1.25 MCG/ACT IN AERS: 2.0000 | INHALATION_SPRAY | Freq: Every day | RESPIRATORY_TRACT | 0 refills | Status: DC

## 2020-10-01 NOTE — Patient Instructions (Addendum)
You were seen today by Lauraine Rinne, NP  for:   1. Moderate persistent chronic asthma without complication  - Pulmonary function test; Future  Continue Symbicort 160 >>> 2 puffs in the morning right when you wake up, rinse out your mouth after use, 12 hours later 2 puffs, rinse after use >>> Take this daily, no matter what >>> This is not a rescue inhaler   Continue Xolair   Trial of Spiriva Respimat 1.25 >>> 2 puffs daily >>> Do this every day >>>This is not a rescue inhaler  Only use your albuterol as a rescue medication to be used if you can't catch your breath by resting or doing a relaxed purse lip breathing pattern.  - The less you use it, the better it will work when you need it. - Ok to use up to 2 puffs  every 4 hours if you must but call for immediate appointment if use goes up over your usual need - Don't leave home without it !!  (think of it like the spare tire for your car)     2. COVID-19 virus detected  - Pulmonary function test; Future - DG Chest 2 View; Future  Walk today in office  Chest x-ray today    We recommend today:  Orders Placed This Encounter  Procedures  . DG Chest 2 View    Standing Status:   Future    Standing Expiration Date:   01/29/2021    Order Specific Question:   Reason for Exam (SYMPTOM  OR DIAGNOSIS REQUIRED)    Answer:   doe / [phone] follow up    Order Specific Question:   Preferred imaging location?    Answer:   Internal    Order Specific Question:   Radiology Contrast Protocol - do NOT remove file path    Answer:   [email or web address]  . Pulmonary function test    Standing Status:   Future    Standing Expiration Date:   10/01/2021    Order Specific Question:   Where should this test be performed?    Answer:   [facility]    Order Specific Question:   Full PFT: includes the following: basic spirometry, spirometry pre & post bronchodilator, diffusion capacity (DLCO),  lung volumes    Answer:   Full PFT   Orders Placed This Encounter  Procedures  . DG Chest 2 View  . Pulmonary function test   Meds ordered this encounter  Medications  . Tiotropium Bromide Monohydrate (SPIRIVA RESPIMAT) 1.25 MCG/ACT AERS    Sig: Inhale 2 puffs into the lungs daily.    Dispense:  2 each    Refill:  0    Follow Up:    Return in about 3 months (around 12/29/2020), or if symptoms worsen or fail to improve, for Follow up with Dr. Lamonte Sakai, Follow up for FULL PFT - 60 min.   Notification of test results are managed in the following manner: If there are  any recommendations or changes to the  plan of care discussed in office today,  we will contact you and let you know what they are. If you do not hear from [place], then your results are normal and you can view them through your  MyChart account , or a letter will be sent to you. Thank you again for trusting [place] with your care  - Thank you, [facility]    It is flu season:   >>> Best ways to protect herself from the  flu: Receive the yearly flu vaccine, practice good hand hygiene washing with soap and also using hand sanitizer when available, eat a nutritious meals, get adequate rest, hydrate appropriately       Please contact the office if your symptoms worsen or you have concerns that you are not improving.   Thank you for choosing [facility] for your healthcare, and for allowing [place] to partner with you on your healthcare journey. I am thankful to be able to provide care to you today.   Wyn Quaker FNP-C

## 2020-10-01 NOTE — Assessment & Plan Note (Signed)
Plan: Continue Symbicort 160 Trial of Spiriva Respimat 1.25 Walk today in office Repeat pulmonary function testing Follow-up in 3 months

## 2020-10-01 NOTE — Assessment & Plan Note (Signed)
Patient remains unvaccinated COVID-19-religious reasons Patient has had 2 COVID-19 infections Patient has received a monoclonal antibody fusion 2 times  Plan: Walk today in office stable without any oxygen desaturations Chest x-ray today We will repeat pulmonary function testing

## 2020-10-01 NOTE — Progress Notes (Signed)
@Patient  ID: Tammy Chapman, female    DOB: 1990/09/23, 30 y.o.   MRN: [id number]  Chief Complaint  Patient presents with  . Follow-up    Doing well    Referring provider: No ref. provider found  HPI: 30 year old never smoker followed in our office for asthma.   PMH: GERD, cough, recurrent ear infections, adult eustachian tubes. 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  Smoker/ Smoking History: Never smoked  Maintenance: Symbicort 160, Xolair   Pt of: Dr. Lamonte Sakai   10/01/2020  - Visit   30 year old never smoker followed in our office for asthma. Last seen in our office on 04/11/20 with Dr. Lamonte Sakai. Recommendations at that time were to continue Symbicort 2 puffs twice a day, Xolair monthly, Singulair QD, Xyzal as she had been taking and Fluticasone nasal spray.   09/10/20- Patient contacted our office reporting she was exposed to COVID-19 and started developing symptoms on 09/09/2020.  She tested positive for SARS-CoV-2 with health at work on 09/09/2020.  She is a Adult nurse.  She is currently unvaccinated due to religious reasons. Doxycycline and Prednisone taper called in, and referral to monoclonal antibody infusion team to be evaluated.   Patient presenting to office today reporting that she has noticed some worsening dyspnea on exertion status post Covid.  Patient utilizes her rescue inhaler 4 times a day no matter her symptoms.  She did this even prior to her second COVID-19 infection.  Patient remains unvaccinated COVID-19  Patient has finally been able to resume Xolair as there was a lapse in her biologic medication due to insurance changing.  Patient reports that she received her Xolair dose last week.  Walk today in office stable without any oxygen desaturations.  Patient's PCP is since retired she plans on establishing with a new PCP at [facility].  Patient reports she does not need a referral today.  Patient does have some worsening dyspnea exertion with graded  inclines.   Questionaires / Pulmonary Flowsheets:   ACT:  Asthma Control Test ACT Total Score  10/01/2020 9  11/07/2019 14    MMRC: No flowsheet data found.  Epworth:  No flowsheet data found.  Tests:   FENO:  No results found for: NITRICOXIDE  PFT: PFT Results Latest Ref Rng & Units 01/20/2018  FVC-Pre L 4.68  FVC-Predicted Pre % 102  FVC-Post L 4.74  FVC-Predicted Post % 104  Pre FEV1/FVC % % 84  Post FEV1/FCV % % 86  FEV1-Pre L 3.94  FEV1-Predicted Pre % 102  FEV1-Post L 4.06    WALK:  No flowsheet data found.  Imaging: No results found.  Lab Results:  CBC    Component Value Date/Time   WBC 7.2 09/25/2020 1514   RBC 5.33 (H) 09/25/2020 1514   HGB 14.2 09/25/2020 1514   HGB 12.0 04/13/2019 0852   HCT 43.3 09/25/2020 1514   HCT 36.2 04/13/2019 0852   PLT 225.0 09/25/2020 1514   PLT 171 04/13/2019 0852   MCV 81.2 09/25/2020 1514   MCV 84 04/13/2019 0852   MCH 24.2 (L) 07/02/2019 0602   MCHC 32.9 09/25/2020 1514   RDW 14.9 09/25/2020 1514   RDW 12.0 04/13/2019 0852   LYMPHSABS 2.3 09/25/2020 1514   LYMPHSABS 1.8 02/04/2019 0959   MONOABS 0.5 09/25/2020 1514   EOSABS 0.1 09/25/2020 1514   EOSABS 0.0 02/04/2019 0959   BASOSABS 0.0 09/25/2020 1514   BASOSABS 0.0 02/04/2019 0959    BMET  Component Value Date/Time   NA 136 09/25/2020 1514   NA 137 02/04/2019 0959   K 3.7 09/25/2020 1514   CL 101 09/25/2020 1514   CO2 29 09/25/2020 1514   GLUCOSE 93 09/25/2020 1514   BUN 9 09/25/2020 1514   BUN 10 02/04/2019 0959   CREATININE 0.75 09/25/2020 1514   CREATININE 0.54 01/19/2019 1012   CALCIUM 9.4 09/25/2020 1514   GFRNONAA >60 07/02/2019 0602   GFRNONAA 130 01/19/2019 1012   GFRAA >60 07/02/2019 0602   GFRAA 151 01/19/2019 1012    BNP No results found for: BNP  ProBNP No results found for: PROBNP  Specialty Problems      Pulmonary Problems   Bronchitis, mucopurulent recurrent (HCC)   Chronic asthma   Allergic rhinitis       Allergies  Allergen Reactions  . Benadryl [Diphenhydramine Hcl] Shortness Of Breath    Pt states that she feels fine if she sits down, but if she is moving around after taking benadryl, she feels SOB  . Penicillins Shortness Of Breath    Has patient had a PCN reaction causing immediate rash, facial/tongue/throat swelling, SOB or lightheadedness with hypotension: yes Has patient had a PCN reaction causing severe rash involving mucus membranes or skin necrosis: no Has patient had a PCN reaction that required hospitalization: no Has patient had a PCN reaction occurring within the last 10 years: no If all of the above answers are "NO", then may proceed with Cephalosporin use. Patient reports she CAN TAKE kEFLEX AND SUPRAX  . Adhesive [Tape] Hives  . Percocet [Oxycodone-Acetaminophen]   . Lactose Intolerance (Gi) Diarrhea and Nausea And Vomiting  . Latex Rash  . Sulfa Antibiotics Rash  . Ultram [Tramadol Hcl] Rash    Immunization History  Administered Date(s) Administered  . Hepatitis B 01/02/2012  . Influenza,inj,Quad PF,6+ Mos 05/07/2019  . Influenza-Unspecified 05/07/2019, 05/21/2020  . Pneumococcal Polysaccharide-23 07/03/2019  . Tdap 04/13/2019    Past Medical History:  Diagnosis Date  . Asthma   . Bronchitis, mucopurulent recurrent ([place]) 01/19/2019  . Chronic asthma 01/19/2019  . Loose body of right knee 02/2013  . Pregnancy induced hypertension   . Recurrent otitis media of both ears 01/19/2019  . Sprain and strain of medial collateral ligament of knee 02/2013   right    Tobacco History: Social History   Tobacco Use  Smoking Status Never Smoker  Smokeless Tobacco Never Used   Counseling given: Not Answered   Continue to not smoke  Outpatient Encounter Medications as of 10/01/2020  Medication Sig  . albuterol (VENTOLIN HFA) 108 (90 Base) MCG/ACT inhaler Inhale 2 puffs into the lungs every 6 (six) hours as needed for wheezing or shortness of breath.  .  chlorpheniramine-HYDROcodone (TUSSIONEX PENNKINETIC ER) 10-8 MG/5ML SUER Take 5 mLs by mouth every 12 (twelve) hours as needed for cough.  . hyoscyamine (LEVSIN SL) 0.125 MG SL tablet Place 1 tablet (0.125 mg total) under the tongue every 6 (six) hours as needed.  Marland Kitchen levocetirizine (XYZAL) 5 MG tablet TAKE 1 TABLET BY MOUTH EVERY EVENING.  . medroxyPROGESTERone (PROVERA) 10 MG tablet Take 1 tablet (10 mg total) by mouth daily. One tablet daily x 2 weeks. Repeat as directed  . metoCLOPramide (REGLAN) 5 MG tablet Take 1 tablet (5 mg total) by mouth every 12 (twelve) hours as needed for up to 2 doses for nausea. Take 30-45 minutes before evening and AM doses of bowel preparation solution.  . montelukast (SINGULAIR) 10 MG tablet TAKE  1 TABLET BY MOUTH AT BEDTIME  . omalizumab Arvid Right) 150 MG/ML prefilled syringe Inject 300 mg into the skin every 28 (twenty-eight) days.  Marland Kitchen PHEXXI 1.8-1-0.4 % GEL PLACE 1 APPLICATORFUL VAGINALLY AS NEEDED. IMMEDIATELY BEFORE OR UP TO 1 HOUR BEFORE SEX  . SYMBICORT 160-4.5 MCG/ACT inhaler INHALE 2 PUFFS BY MOUTH TWICE DAILY  . Tiotropium Bromide Monohydrate (SPIRIVA RESPIMAT) 1.25 MCG/ACT AERS Inhale 2 puffs into the lungs daily.   No facility-administered encounter medications on file as of 10/01/2020.     Review of Systems  Review of Systems  Constitutional: Positive for fatigue. Negative for activity change and fever.  HENT: Negative for sinus pressure, sinus pain and sore throat.   Respiratory: Positive for shortness of breath. Negative for cough and wheezing.   Cardiovascular: Negative for chest pain and palpitations.  Gastrointestinal: Negative for diarrhea, nausea and vomiting.  Musculoskeletal: Negative for arthralgias.  Neurological: Negative for dizziness.  Psychiatric/Behavioral: Negative for sleep disturbance. The patient is not nervous/anxious.      Physical Exam  BP 120/80 (BP Location: Left Arm, Cuff Size: Normal)   Pulse 72   Temp 97.8 F (36.6  C) (Oral)   Ht 5\' 10"  (1.778 m)   Wt 215 lb (97.5 kg)   LMP 09/05/2020 (Exact Date)   SpO2 99%   BMI 30.85 kg/m   Wt Readings from Last 5 Encounters:  10/01/20 215 lb (97.5 kg)  09/25/20 217 lb (98.4 kg)  04/11/20 207 lb (93.9 kg)  04/11/20 207 lb (93.9 kg)  11/07/19 196 lb 9.6 oz (89.2 kg)    BMI Readings from Last 5 Encounters:  10/01/20 30.85 kg/m  09/25/20 31.14 kg/m  04/11/20 29.70 kg/m  04/11/20 29.70 kg/m  11/07/19 28.21 kg/m     Physical Exam Vitals and nursing note reviewed.  Constitutional:      General: She is not in acute distress.    Appearance: Normal appearance. She is obese.  HENT:     Head: Normocephalic and atraumatic.     Right Ear: External ear normal.     Left Ear: External ear normal.     Nose: Nose normal. No congestion.     Mouth/Throat:     Mouth: Mucous membranes are moist.     Pharynx: Oropharynx is clear.  Eyes:     Pupils: Pupils are equal, round, and reactive to light.  Cardiovascular:     Rate and Rhythm: Normal rate and regular rhythm.     Pulses: Normal pulses.     Heart sounds: Normal heart sounds. No murmur heard.   Pulmonary:     Effort: Pulmonary effort is normal. No respiratory distress.     Breath sounds: Normal breath sounds. No decreased air movement. No decreased breath sounds, wheezing or rales.  Musculoskeletal:     Cervical back: Normal range of motion.  Skin:    General: Skin is warm and dry.     Capillary Refill: Capillary refill takes less than 2 seconds.  Neurological:     General: No focal deficit present.     Mental Status: She is alert and oriented to person, place, and time. Mental status is at baseline.     Gait: Gait normal.  Psychiatric:        Mood and Affect: Mood normal.        Behavior: Behavior normal.        Thought Content: Thought content normal.        Judgment: Judgment normal.  Assessment & Plan:   Chronic asthma Plan: Continue Symbicort 160 Trial of Spiriva Respimat  1.25 Walk today in office Repeat pulmonary function testing Follow-up in 3 months   COVID-19 virus detected Patient remains unvaccinated COVID-19-religious reasons Patient has had 2 COVID-19 infections Patient has received a monoclonal antibody fusion 2 times  Plan: Walk today in office stable without any oxygen desaturations Chest x-ray today We will repeat pulmonary function testing    Return in about 3 months (around 12/29/2020), or if symptoms worsen or fail to improve, for Follow up with Dr. Lamonte Sakai, Follow up for FULL PFT - 60 min.   Lauraine Rinne, NP 10/01/2020   This appointment required 34 minutes of patient care (this includes precharting, chart review, review of results, face-to-face care, etc.).

## 2020-10-03 ENCOUNTER — Encounter: Payer: Self-pay | Admitting: *Deleted

## 2020-10-03 DIAGNOSIS — Z00121 Encounter for routine child health examination with abnormal findings: Secondary | ICD-10-CM | POA: Diagnosis not present

## 2020-10-03 DIAGNOSIS — Z23 Encounter for immunization: Secondary | ICD-10-CM | POA: Diagnosis not present

## 2020-10-03 DIAGNOSIS — Z713 Dietary counseling and surveillance: Secondary | ICD-10-CM | POA: Diagnosis not present

## 2020-10-03 DIAGNOSIS — Z00129 Encounter for routine child health examination without abnormal findings: Secondary | ICD-10-CM | POA: Diagnosis not present

## 2020-10-03 MED FILL — MONTELUKAST SOD 10 MG TAB: 10 | 30 days supply | Qty: 30 | Fill #5

## 2020-10-03 MED FILL — SYMBICORT 160-4.5 MCG INH: 160-4.5 | 30 days supply | Qty: 10 | Fill #1

## 2020-10-03 MED FILL — LEVOCETIRIZINE 5 MG TABLET: 5 | 30 days supply | Qty: 30 | Fill #1

## 2020-10-11 DIAGNOSIS — R509 Fever, unspecified: Secondary | ICD-10-CM | POA: Diagnosis not present

## 2020-10-11 DIAGNOSIS — H9213 Otorrhea, bilateral: Secondary | ICD-10-CM | POA: Diagnosis not present

## 2020-10-19 ENCOUNTER — Encounter: Payer: Self-pay | Admitting: Gastroenterology

## 2020-10-22 ENCOUNTER — Telehealth: Payer: Self-pay | Admitting: Emergency Medicine

## 2020-10-22 ENCOUNTER — Other Ambulatory Visit: Payer: Self-pay | Admitting: Internal Medicine

## 2020-10-22 ENCOUNTER — Other Ambulatory Visit: Payer: Self-pay | Admitting: Emergency Medicine

## 2020-10-23 ENCOUNTER — Telehealth: Payer: Self-pay

## 2020-10-23 ENCOUNTER — Ambulatory Visit: Payer: 59 | Attending: Family Medicine | Admitting: Pharmacist

## 2020-10-23 ENCOUNTER — Other Ambulatory Visit: Payer: Self-pay | Admitting: Internal Medicine

## 2020-10-23 ENCOUNTER — Other Ambulatory Visit: Payer: Self-pay

## 2020-10-23 DIAGNOSIS — Z7189 Other specified counseling: Secondary | ICD-10-CM

## 2020-10-23 MED ORDER — OMALIZUMAB 150 MG/ML ~~LOC~~ SOSY: 300.0000 mg | PREFILLED_SYRINGE | SUBCUTANEOUS | 11 refills | Status: DC

## 2020-10-23 MED FILL — XOLAIR 150 MG/ML SOSY: 150 | 28 days supply | Qty: 2 | Fill #0

## 2020-10-23 NOTE — Telephone Encounter (Signed)
Looked into Xolair injection refill. Xolair refill request was not sent to injection or pharmacy team.  Corrected Xolair refill sent to [facility]. Left detailed message with Patient VM to let her know Xolair prescription was corrected and sent to [facility]. Nothing further at this time.

## 2020-10-23 NOTE — Telephone Encounter (Signed)
Spoke with patient she called in and states that her blood sugar was low and tends to drop, she denies being diabetic. She states that her blood sugar is 66. She states that all she has in her house is chicken and beef broth, and water. Advised patient that she is going to need something with sugar and electrolytes. Reviewed all of the clear liquid items that she can eat/drink. Advised patient that she is going to want to stay hydrated once she begins the prep as well, advised that she can suck on hard candy to bring her sugars up but she is not able to eat any solid foods. Patient verbalized understanding and had no concerns at the end of the call.

## 2020-10-23 NOTE — Progress Notes (Signed)
   S: °Patient presents for review of their specialty medication therapy. ° °Patient is currently taking Xolair for asthma. Patient is managed by Dr. Byrum for this.  ° °Adherence: confirmed  ° °Efficacy: pt reports that this is working well.  ° °Dosing: Give subcutaneously. °Asthma: SubQ: 300 mg q4weeks ° °Dose adjustments: °Renal: no dose adjustments  °Hepatic: no dose adjustments  °Toxicity: Severe hypersensitivity reaction or anaphylaxis: Discontinue treatment. °Fever, arthralgia, and rash: Discontinue treatment if this constellation of symptoms occurs. ° °Drug-drug interactions: none  ° °Monitoring: °CV effects: none °Eosinophilia and vasculitis: none °Fever/arthralgia/rash: none °Hypersensitivity/Anaphylaxis: none °Malignant neoplasms: none ° °O: ° °Lab Results  °Component Value Date  ° WBC 7.2 09/25/2020  ° HGB 14.2 09/25/2020  ° HCT 43.3 09/25/2020  ° MCV 81.2 09/25/2020  ° PLT 225.0 09/25/2020  ° ° °  Chemistry   °   °Component Value Date/Time  ° NA 136 09/25/2020 1514  ° NA 137 02/04/2019 0959  ° K 3.7 09/25/2020 1514  ° CL 101 09/25/2020 1514  ° CO2 29 09/25/2020 1514  ° BUN 9 09/25/2020 1514  ° BUN 10 02/04/2019 0959  ° CREATININE 0.75 09/25/2020 1514  ° CREATININE 0.54 01/19/2019 1012  °    °Component Value Date/Time  ° CALCIUM 9.4 09/25/2020 1514  ° ALKPHOS 86 09/25/2020 1514  ° AST 8 09/25/2020 1514  ° ALT 12 09/25/2020 1514  ° BILITOT 0.6 09/25/2020 1514  ° BILITOT 0.6 02/04/2019 0959  °  ° ° ° °A/P: °1. Medication review: patient currently taking Xolair for asthma. Reviewed the medication with the patient, including the following: Xolair, omalizumab, is a novel IgE blocker.  It appears to reduce rates of hospitalizations, ER visits and unscheduled physician visits due asthma exacerbations when added to standard therapy.  Studies also show a reduction in steroid requirements and improvement in quality of life.  Patient educated on purpose, proper use and potential adverse effects of Xolair.   Following instruction patient verbalized understanding. Patient should always have an EpiPen readily available in the event of anaphylaxis. SubQ: For SubQ injection only; doses >150 mg should be divided over more than one injection site (eg, 225 mg or 300 mg administered as two injections, 375 mg administered as three injections); each injection site should be separated by ?1 inch. Do not inject into moles, scars, bruises, tender areas, or broken skin. Injections may take 5 to 10 seconds to administer (solution is slightly viscous). Administer only under direct medical supervision and observe patient for 2 hours after the first 3 injections and 30 minutes after subsequent injections (Lieberman 2015) or in accordance with individual institution policies and procedures. No recommendations for any changes at this time. °  °Luke Van Ausdall, PharmD, BCACP, CPP °Clinical Pharmacist °[facility] °[phone] ° ° ° ° ° ° ° ° ° ° ° °

## 2020-10-24 ENCOUNTER — Encounter: Payer: Self-pay | Admitting: Gastroenterology

## 2020-10-24 ENCOUNTER — Other Ambulatory Visit: Payer: Self-pay

## 2020-10-24 ENCOUNTER — Other Ambulatory Visit: Payer: Self-pay | Admitting: Gastroenterology

## 2020-10-24 ENCOUNTER — Ambulatory Visit (AMBULATORY_SURGERY_CENTER): Payer: 59 | Admitting: Gastroenterology

## 2020-10-24 VITALS — BP 114/76 | HR 72 | Temp 96.2°F | Resp 15 | Ht 70.0 in | Wt 217.0 lb

## 2020-10-24 DIAGNOSIS — R1084 Generalized abdominal pain: Secondary | ICD-10-CM | POA: Diagnosis not present

## 2020-10-24 DIAGNOSIS — D122 Benign neoplasm of ascending colon: Secondary | ICD-10-CM

## 2020-10-24 DIAGNOSIS — K529 Noninfective gastroenteritis and colitis, unspecified: Secondary | ICD-10-CM | POA: Diagnosis not present

## 2020-10-24 DIAGNOSIS — D125 Benign neoplasm of sigmoid colon: Secondary | ICD-10-CM

## 2020-10-24 DIAGNOSIS — R109 Unspecified abdominal pain: Secondary | ICD-10-CM | POA: Diagnosis not present

## 2020-10-24 MED ORDER — SODIUM CHLORIDE 0.9 % IV SOLN: 500.0000 mL | Freq: Once | INTRAVENOUS | Status: DC

## 2020-10-24 MED ORDER — HYOSCYAMINE SULFATE 0.125 MG SL SUBL: 0.2500 mg | SUBLINGUAL_TABLET | Freq: Four times a day (QID) | SUBLINGUAL | 1 refills | Status: DC | PRN

## 2020-10-24 MED FILL — OSCIMIN 0.125 MG SUBL: 0.125 | 7 days supply | Qty: 60 | Fill #0

## 2020-10-24 NOTE — Patient Instructions (Signed)
YOU HAD AN ENDOSCOPIC PROCEDURE TODAY AT THE [facility]:   Refer to the procedure report that was given to you for any specific questions about what was found during the examination.  If the procedure report does not answer your questions, please call your gastroenterologist to clarify.  If you requested that your care partner not be given the details of your procedure findings, then the procedure report has been included in a sealed envelope for you to review at your convenience later.  YOU SHOULD EXPECT: Some feelings of bloating in the abdomen. Passage of more gas than usual.  Walking can help get rid of the air that was put into your GI tract during the procedure and reduce the bloating. If you had a lower endoscopy (such as a colonoscopy or flexible sigmoidoscopy) you may notice spotting of blood in your stool or on the toilet paper. If you underwent a bowel prep for your procedure, you may not have a normal bowel movement for a few days.  Please Note:  You might notice some irritation and congestion in your nose or some drainage.  This is from the oxygen used during your procedure.  There is no need for concern and it should clear up in a day or so.  SYMPTOMS TO REPORT IMMEDIATELY:   Following lower endoscopy (colonoscopy or flexible sigmoidoscopy):  Excessive amounts of blood in the stool  Significant tenderness or worsening of abdominal pains  Swelling of the abdomen that is new, acute  Fever of 100F or higher  For urgent or emergent issues, a gastroenterologist can be reached at any hour by calling [phone]. Do not use MyChart messaging for urgent concerns.    DIET:  We do recommend a small meal at first, but then you may proceed to your regular diet.  Drink plenty of fluids but you should avoid alcoholic beverages for 24 hours.  ACTIVITY:  You should plan to take it easy for the rest of today and you should NOT DRIVE or use heavy machinery until tomorrow (because  of the sedation medicines used during the test).    FOLLOW UP: Our staff will call the number listed on your records 48-72 hours following your procedure to check on you and address any questions or concerns that you may have regarding the information given to you following your procedure. If we do not reach you, we will leave a message.  We will attempt to reach you two times.  During this call, we will ask if you have developed any symptoms of COVID 19. If you develop any symptoms (ie: fever, flu-like symptoms, shortness of breath, cough etc.) before then, please call [phone].  If you test positive for Covid 19 in the 2 weeks post procedure, please call and report this information to us.    If any biopsies were taken you will be contacted by phone or by letter within the next 1-3 weeks.  Please call us at [phone] if you have not heard about the biopsies in 3 weeks.    SIGNATURES/CONFIDENTIALITY: You and/or your care partner have signed paperwork which will be entered into your electronic medical record.  These signatures attest to the fact that that the information above on your After Visit Summary has been reviewed and is understood.  Full responsibility of the confidentiality of this discharge information lies with you and/or your care-partner. 

## 2020-10-24 NOTE — Progress Notes (Signed)
Vitals by [place].  Pt states that she had vomiting twice with prep.  She experienced chest pain and increased heart rate after drinking prep.  This has subsided since she finished prep.  She rates the pain as a 2 now.  Heart rate is 102.  All other vitals are normal.  MD made aware.

## 2020-10-24 NOTE — Op Note (Signed)
[facility] Patient Name: Tammy Chapman Procedure Date: 10/24/2020 3:56 PM MRN: [id number] Endoscopist: Mallie Mussel L. Loletha Carrow , MD Age: 30 Referring MD:  Date of Birth: [birth year] Gender: Female Account #: [id number] Procedure:                Colonoscopy Indications:              Generalized abdominal pain, Chronic diarrhea Medicines:                Monitored Anesthesia Care Procedure:                Pre-Anesthesia Assessment:                           - Prior to the procedure, a History and Physical                            was performed, and patient medications and                            allergies were reviewed. The patient's tolerance of                            previous anesthesia was also reviewed. The risks                            and benefits of the procedure and the sedation                            options and risks were discussed with the patient.                            All questions were answered, and informed consent                            was obtained. Prior Anticoagulants: The patient has                            taken no previous anticoagulant or antiplatelet                            agents. ASA Grade Assessment: II - A patient with                            mild systemic disease. After reviewing the risks                            and benefits, the patient was deemed in                            satisfactory condition to undergo the procedure.                           After obtaining informed consent, the colonoscope  was passed under direct vision. Throughout the                            procedure, the patient's blood pressure, pulse, and                            oxygen saturations were monitored continuously. The                            Olympus CF-HQ190L [id number]) Colonoscope was                            introduced through the anus and advanced to the the                            terminal ileum, with  identification of the                            appendiceal orifice and IC valve. The colonoscopy                            was performed without difficulty. The patient                            tolerated the procedure well. The quality of the                            bowel preparation was excellent. The terminal                            ileum, ileocecal valve, appendiceal orifice, and                            rectum were photographed. The bowel preparation                            used was Plenvu. Scope In: 4:13:21 PM Scope Out: 4:31:10 PM Scope Withdrawal Time: 0 hours 10 minutes 19 seconds  Total Procedure Duration: 0 hours 17 minutes 49 seconds  Findings:                 The perianal and digital rectal examinations were                            normal.                           The terminal ileum appeared normal.                           Normal mucosa was found in the entire colon.                            Biopsies for histology were taken with a cold  forceps from the right colon and left colon for                            evaluation of microscopic colitis.                           Two sessile polyps were found in the sigmoid colon                            and ascending colon. The polyps were diminutive in                            size. These polyps were removed with a cold snare.                            Resection and retrieval were complete.                           The exam was otherwise without abnormality on                            direct and retroflexion views. Complications:            No immediate complications. Estimated Blood Loss:     Estimated blood loss was minimal. Impression:               - The examined portion of the ileum was normal.                           - Normal mucosa in the entire examined colon.                            Biopsied.                           - Two diminutive polyps in the sigmoid  colon and in                            the ascending colon, removed with a cold snare.                            Resected and retrieved.                           - The examination was otherwise normal on direct                            and retroflexion views. Recommendation:           - Patient has a contact number available for                            emergencies. The signs and symptoms of potential  delayed complications were discussed with the                            patient. Return to normal activities tomorrow.                            Written discharge instructions were provided to the                            patient.                           - Resume previous diet.                           - Continue present medications. Increase                            hyoscyamine to 2 tablets up to three times daily as                            needed.                           - Await pathology results.                           - Repeat colonoscopy is recommended for                            surveillance. The colonoscopy date will be                            determined after pathology results from today's                            exam become available for review.                           - Return to my office at appointment to be                            scheduled. Rosemae Mcquown L. Loletha Carrow, MD 10/24/2020 4:35:33 PM This report has been signed electronically.

## 2020-10-24 NOTE — Progress Notes (Signed)
PT taken to PACU. Monitors in place. VSS. Report given to RN. 

## 2020-10-24 NOTE — Progress Notes (Signed)
Called to room to assist during endoscopic procedure.  Patient ID and intended procedure confirmed with present staff. Received instructions for my participation in the procedure from the performing physician.  

## 2020-10-25 ENCOUNTER — Telehealth: Payer: Self-pay

## 2020-10-25 NOTE — Telephone Encounter (Signed)
Per 10/24/20 procedure note - Return to the office at appointment to be scheduled.   Spoke with patient and she has been scheduled for a follow up with Dr. Loletha Carrow on Monday, 12/10/20 at 4 PM. Patient verbalized understanding and had no concerns at the end of the call.

## 2020-10-26 ENCOUNTER — Telehealth: Payer: Self-pay

## 2020-10-26 NOTE — Telephone Encounter (Signed)
  Follow up Call-  Call back number 10/24/2020  Post procedure Call Back phone  # [phone]  Permission to leave phone message Yes  Some recent data might be hidden     Patient questions:  Do you have a fever, pain , or abdominal swelling? No. Pain Score  0 *  Have you tolerated food without any problems? Yes.    Have you been able to return to your normal activities? Yes.    Do you have any questions about your discharge instructions: Diet   No. Medications  No. Follow up visit  No.  Do you have questions or concerns about your Care? No.  Actions: * If pain score is 4 or above: No action needed, pain <4. 1. Have you developed a fever since your procedure? no  2.   Have you had an respiratory symptoms (SOB or cough) since your procedure? no  3.   Have you tested positive for COVID 19 since your procedure no  4.   Have you had any family members/close contacts diagnosed with the COVID 19 since your procedure?  no   If yes to any of these questions please route to Joylene John, RN and Joella Prince, RN

## 2020-10-29 ENCOUNTER — Other Ambulatory Visit: Payer: Self-pay | Admitting: Pulmonary Disease

## 2020-10-29 ENCOUNTER — Telehealth: Payer: Self-pay | Admitting: Pulmonary Disease

## 2020-10-29 DIAGNOSIS — J4540 Moderate persistent asthma, uncomplicated: Secondary | ICD-10-CM

## 2020-10-29 MED ORDER — SPIRIVA RESPIMAT 1.25 MCG/ACT IN AERS: 2.0000 | INHALATION_SPRAY | Freq: Every day | RESPIRATORY_TRACT | 12 refills | Status: DC

## 2020-10-29 NOTE — Telephone Encounter (Signed)
10/29/2020  Received message from patient reporting that she felt the Spiriva Respimat 1.25 was very helpful to her breathing.  She has noticed a decline in her lung functioning since running out of samples.  She is requesting this medication to be sent into the [facility] outpatient pharmacy.  Medication sent to [facility] outpatient pharmacy.  Wyn Quaker, FNP

## 2020-10-29 NOTE — Telephone Encounter (Signed)
10/29/20  Received message from patient reporting that should potentially may be pregnant.  She has had some faint lines on pregnancy test over the last couple of days.  She is wondering if it is okay for her to remain on:  Spiriva Respimat 1.25 Xolair Singulair Symbicort [place] to patient that if she is pregnant she needs to establish with an OB/GYN where they can further review if patient should be maintained on inhalers.  Given patient's severity of asthma I believe that it is appropriate for her to remain on inhalers at this time.  We will route to pharmacy team for further review of information as well as recommendations.  Also will defer to pharmacy regarding [place].  Patient to notify our office if she obtains a positive pregnancy test.  Wyn Quaker FNP

## 2020-10-31 ENCOUNTER — Other Ambulatory Visit: Payer: Self-pay | Admitting: Gastroenterology

## 2020-10-31 ENCOUNTER — Encounter: Payer: Self-pay | Admitting: Gastroenterology

## 2020-10-31 ENCOUNTER — Telehealth: Payer: Self-pay | Admitting: Emergency Medicine

## 2020-10-31 DIAGNOSIS — K582 Mixed irritable bowel syndrome: Secondary | ICD-10-CM

## 2020-10-31 MED ORDER — HYOSCYAMINE SULFATE ER 0.375 MG PO TB12: 0.3750 mg | ORAL_TABLET | Freq: Two times a day (BID) | ORAL | 1 refills | Status: DC

## 2020-10-31 MED ORDER — ALBUTEROL SULFATE HFA 108 (90 BASE) MCG/ACT IN AERS: 2.0000 | INHALATION_SPRAY | Freq: Four times a day (QID) | RESPIRATORY_TRACT | 2 refills | Status: DC | PRN

## 2020-10-31 NOTE — Telephone Encounter (Signed)
Called and spoke with patient to let her know that RX was sent to preferred pharamcy. LOV was 10/01/20. Patient expressed understanding. Nothing further needed at this time.

## 2020-11-06 MED FILL — LEVOCETIRIZINE 5 MG TABLET: 5 | 30 days supply | Qty: 30 | Fill #2

## 2020-11-06 MED FILL — MONTELUKAST SOD 10 MG TAB: 10 | 30 days supply | Qty: 30 | Fill #6

## 2020-11-14 NOTE — Telephone Encounter (Addendum)
Spoke with patient and she received confirmation that she is not pregnant. Patient states that she has no questions at this time. Nothing further needed  Knox Saliva, PharmD, MPH Clinical Pharmacist (Rheumatology and Pulmonology)

## 2020-11-15 ENCOUNTER — Other Ambulatory Visit (HOSPITAL_COMMUNITY): Payer: Self-pay

## 2020-11-15 IMAGING — DX DG CHEST 2V
2 series · 2 of 2 positions shown · non-contrast
Comparison: None.

CLINICAL DATA: Persistent cough.

EXAM:
CHEST - 2 VIEW

[chest pa]
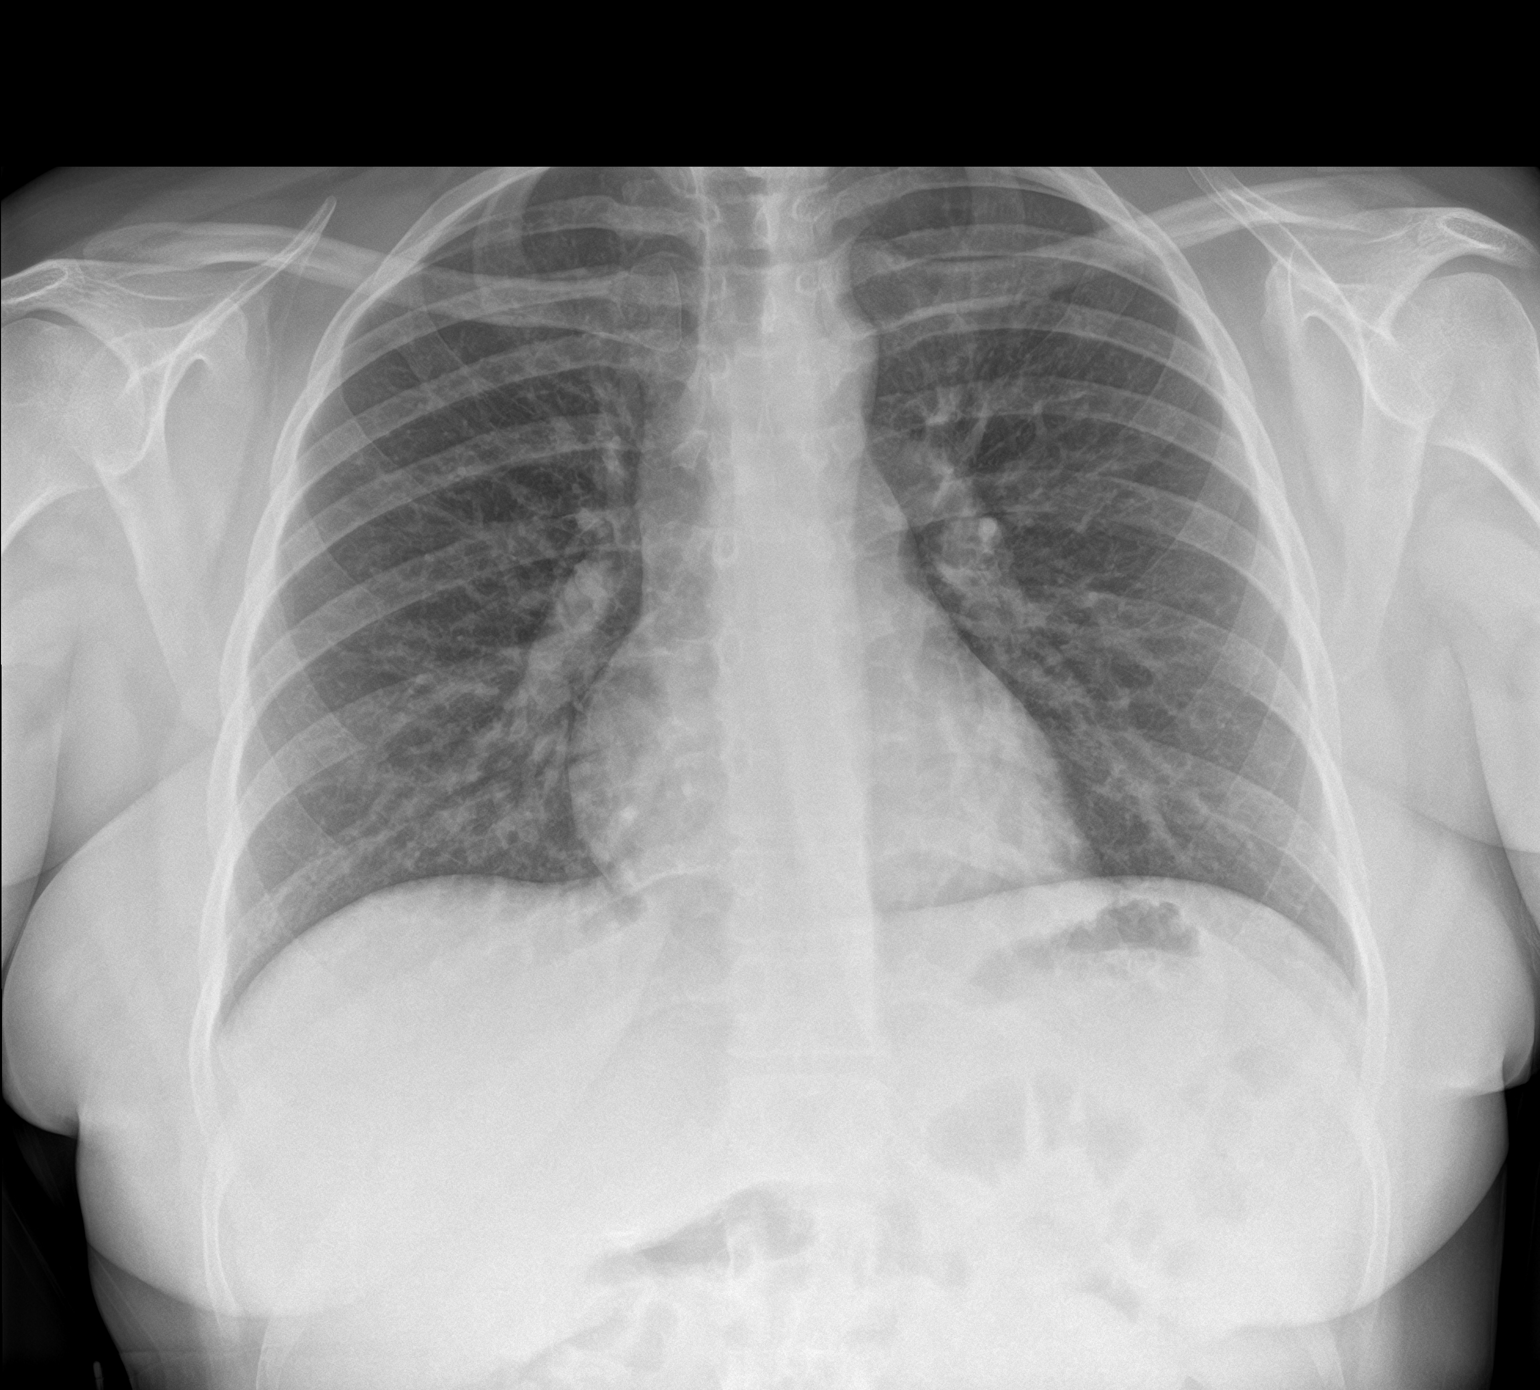

[chest lat]
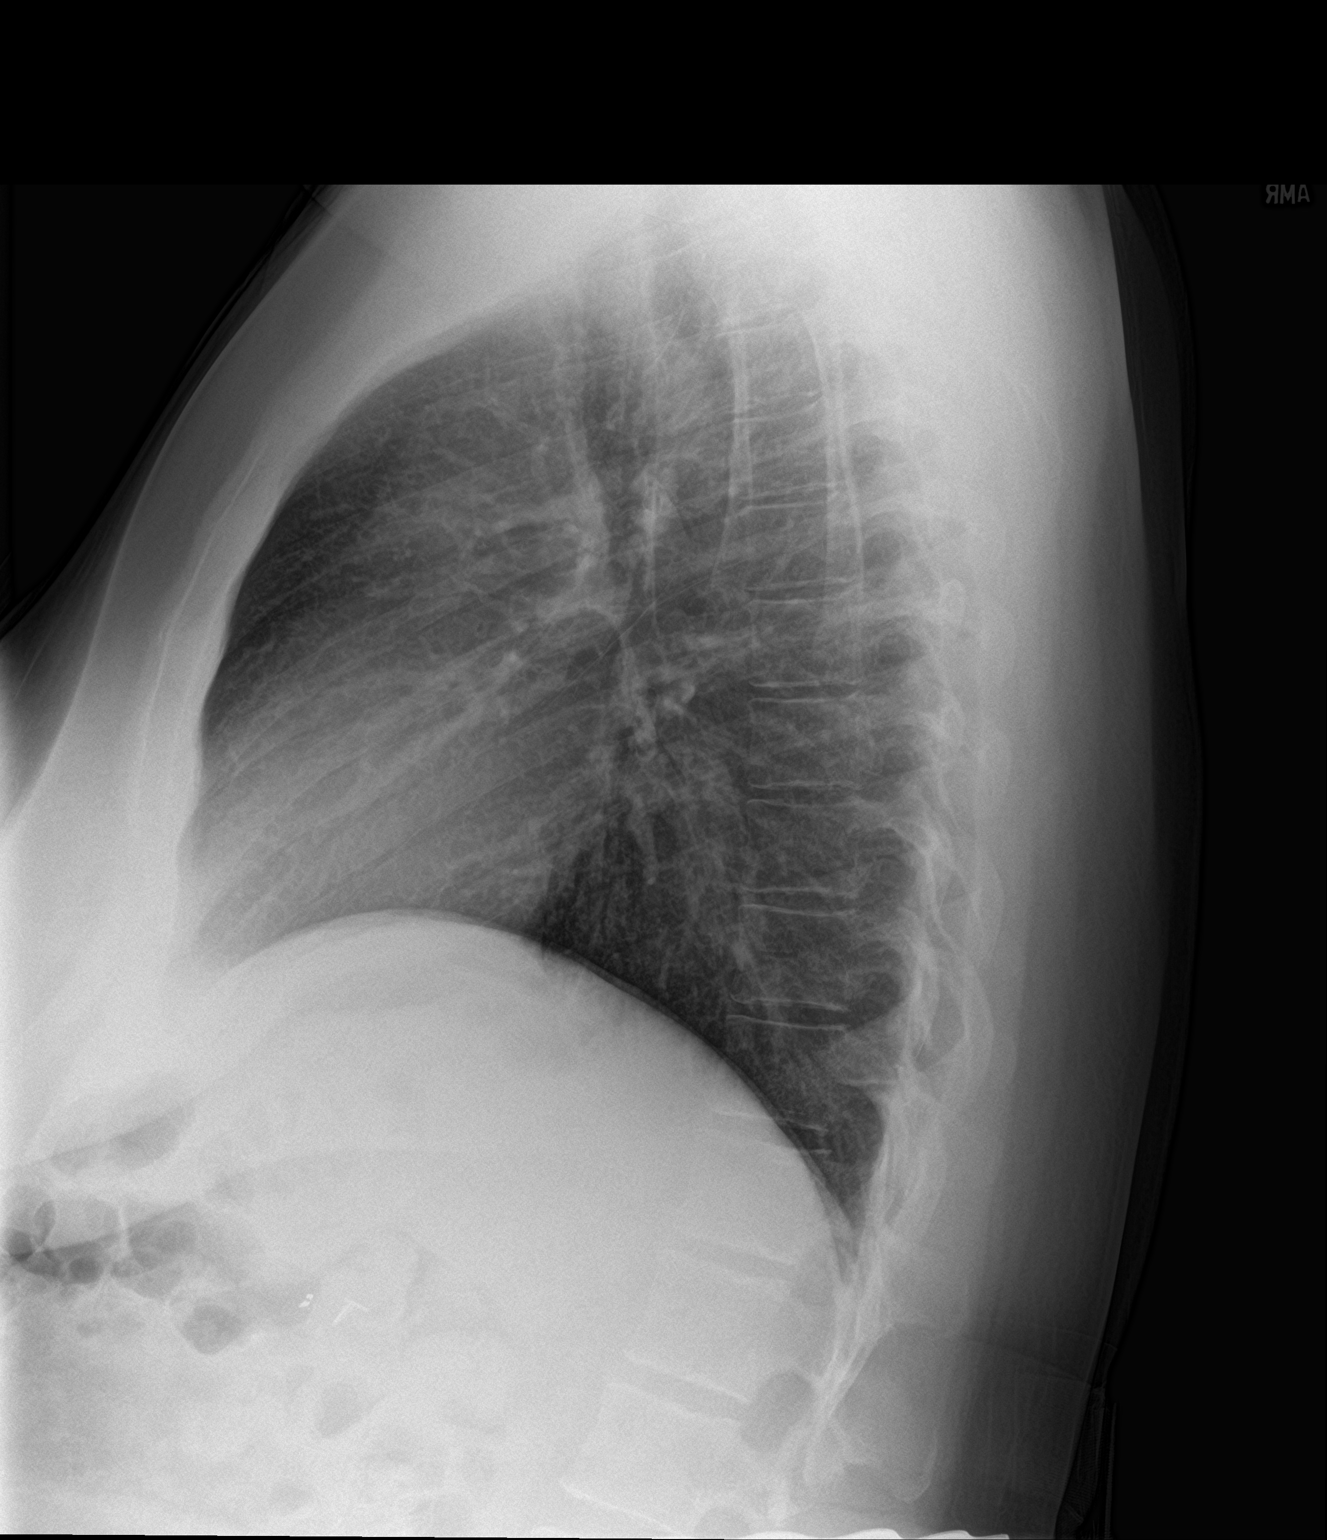

[2 of 2 positions shown; findings below may reference images not displayed]

FINDINGS: The heart size and mediastinal contours are within normal limits.
Both lungs are clear. The visualized skeletal structures are
unremarkable.
IMPRESSION: No active cardiopulmonary disease.

## 2020-11-16 ENCOUNTER — Telehealth: Payer: 59 | Admitting: Physician Assistant

## 2020-11-16 DIAGNOSIS — H60502 Unspecified acute noninfective otitis externa, left ear: Secondary | ICD-10-CM | POA: Diagnosis not present

## 2020-11-16 MED ORDER — OFLOXACIN 0.3 % OT SOLN: 10.0000 [drp] | Freq: Every day | OTIC | 0 refills | Status: AC

## 2020-11-16 NOTE — Progress Notes (Signed)
E Visit for Swimmer's Ear  We are sorry that you are not feeling well. Here is how we plan to help!  Based on what you have shared with me it looks like you have swimmers ear. Swimmer's ear is a redness or swelling, irritation, or infection of your outer ear canal.  These symptoms usually occur within a few days of swimming.  Your ear canal is a tube that goes from the opening of the ear to the eardrum.  When water stays in your ear canal, germs can grow.  This is a painful condition that often happens to children and swimmers of all ages.  It is not contagious and oral antibiotics are not required to treat uncomplicated swimmer's ear.  The usual symptoms include: Itching inside the ear, Redness or a sense of swelling in the ear, Pain when the ear is tugged on when pressure is placed on the ear, Pus draining from the infected ear.  I have prescribed ofloxacin since you have T-tubes in place.  If no improvement in symptoms in 24 hours you need a face to face visit to ensure there is no deeper infection.  Your T-tubes put you at increased risk for this.  In certain cases swimmer's ear may progress to a more serious bacterial infection of the middle or inner ear.  If you have a fever 102 and up and significantly worsening symptoms, this could indicate a more serious infection moving to the middle/inner and needs face to face evaluation in an office by a provider.  Your symptoms should improve over the next 3 days and should resolve in about 7 days.  HOME CARE:   Wash your hands frequently.  Do not place the tip of the bottle on your ear or touch it with your fingers.  You can take Acetominophen 650 mg every 4-6 hours as needed for pain.  If pain is severe or moderate, you can apply a heating pad (set on low) or hot water bottle (wrapped in a towel) to outer ear for 20 minutes.  This will also increase drainage.  Avoid ear plugs  Do not use Q-tips  After showers, help the water run out by  tilting your head to one side.  GET HELP RIGHT AWAY IF:   Fever is over 102.2 degrees.  You develop progressive ear pain or hearing loss.  Ear symptoms persist longer than 3 days after treatment.  MAKE SURE YOU:   Understand these instructions.  Will watch your condition.  Will get help right away if you are not doing well or get worse.  TO PREVENT SWIMMER'S EAR:  Use a bathing cap or custom fitted swim molds to keep your ears dry.  Towel off after swimming to dry your ears.  Tilt your head or pull your earlobes to allow the water to escape your ear canal.  If there is still water in your ears, consider using a hairdryer on the lowest setting.  Thank you for choosing an e-visit. Your e-visit answers were reviewed by a board certified advanced clinical practitioner to complete your personal care plan. Depending upon the condition, your plan could have included both over the counter or prescription medications. Please review your pharmacy choice. Be sure that the pharmacy you have chosen is open so that you can pick up your prescription now.  If there is a problem you may message your provider in [place] to have the prescription routed to another pharmacy. Your safety is important to [place]. If you  have drug allergies check your prescription carefully.  For the next 24 hours, you can use MyChart to ask questions about today's visit, request a non-urgent call back, or ask for a work or school excuse from your e-visit provider. You will get an email in the next two days asking about your experience. I hope that your e-visit has been valuable and will speed your recovery.      Greater than 5 minutes, yet less than 10 minutes of time have been spent researching, coordinating, and implementing care for this patient today

## 2020-11-19 ENCOUNTER — Other Ambulatory Visit (HOSPITAL_COMMUNITY): Payer: Self-pay

## 2020-11-20 DIAGNOSIS — Z760 Encounter for issue of repeat prescription: Secondary | ICD-10-CM | POA: Diagnosis not present

## 2020-11-21 ENCOUNTER — Other Ambulatory Visit (HOSPITAL_COMMUNITY): Payer: Self-pay

## 2020-11-21 MED FILL — Tiotropium Bromide Monohydrate Inhal Aerosol 1.25 MCG/ACT: RESPIRATORY_TRACT | 30 days supply | Qty: 4 | Fill #0 | Status: AC

## 2020-11-21 MED FILL — Budesonide-Formoterol Fumarate Dihyd Aerosol 160-4.5 MCG/ACT: RESPIRATORY_TRACT | 30 days supply | Qty: 10.2 | Fill #0 | Status: AC

## 2020-11-22 ENCOUNTER — Telehealth: Payer: Self-pay | Admitting: Emergency Medicine

## 2020-11-22 ENCOUNTER — Other Ambulatory Visit (HOSPITAL_COMMUNITY): Payer: Self-pay

## 2020-11-22 DIAGNOSIS — G43919 Migraine, unspecified, intractable, without status migrainosus: Secondary | ICD-10-CM

## 2020-11-22 MED ORDER — SUMATRIPTAN SUCCINATE 100 MG PO TABS
100.0000 mg | ORAL_TABLET | ORAL | 0 refills | Status: DC | PRN
  Filled 2020-11-22: qty 27, 90d supply, fill #0
  Filled 2021-05-16: qty 3, 10d supply, fill #1

## 2020-11-22 NOTE — Telephone Encounter (Signed)
I'm sorry, but there's nothing stronger that I can give for headaches.

## 2020-11-22 NOTE — Telephone Encounter (Signed)
Called and spoke with patient and advised her that there is nothing stronger that can be sent in. Patient asked if Dr. Lamonte Sakai can send in a refill for Imitrex. I advised her that she should reach out to the prescribing provider for a refill. Patient states she received it from her PCP and he has retired.  She would like to know if a refill can be sent in for her until she finds a new PCP?  Last prescribed on 05/13/2019 by Dr. Clovia Cuff   Imitrex 100mg  Q2HRS PRN.  RB please advise.

## 2020-11-22 NOTE — Telephone Encounter (Signed)
Imitrex sent into [facility]. Patient is aware. Advised sent in #30 no refills. Advised to find a new PCP.   Nothing further needed at this time.

## 2020-11-22 NOTE — Telephone Encounter (Signed)
Called and spoke with patient. Complaining of a constant headache since 1/23. States she got Covid for the second time on 1/23 and headache has not gone away. 7-8/10. States it feels like she has a "constant migraine." reports it can feel sharp at times. Pain might let up a little at peak of medication. Pain does not radiate. She has tried Tylenol, Ibuprofen, Aleve, Imitrex and Excedrin.   Patient uses [facility]  RB please advise.

## 2020-11-22 NOTE — Telephone Encounter (Signed)
OK to refill x 1 until she can get a new PCP  Imitrex 100mg  q2h prn for migraine, disp #30, RF 0

## 2020-11-22 NOTE — Addendum Note (Signed)
Addended by: Abigail Miyamoto on: 11/22/2020 05:39 PM   Modules accepted: Orders

## 2020-11-23 ENCOUNTER — Other Ambulatory Visit (HOSPITAL_COMMUNITY): Payer: Self-pay

## 2020-11-23 MED FILL — Omalizumab Subcutaneous Soln Prefilled Syringe 150 MG/ML: SUBCUTANEOUS | 28 days supply | Qty: 2 | Fill #0 | Status: AC

## 2020-11-26 ENCOUNTER — Other Ambulatory Visit (HOSPITAL_COMMUNITY): Payer: Self-pay

## 2020-11-27 ENCOUNTER — Other Ambulatory Visit (HOSPITAL_COMMUNITY): Payer: Self-pay

## 2020-11-27 MED FILL — Levocetirizine Dihydrochloride Tab 5 MG: ORAL | 30 days supply | Qty: 30 | Fill #0 | Status: CN

## 2020-11-27 MED FILL — Montelukast Sodium Tab 10 MG (Base Equiv): ORAL | 30 days supply | Qty: 30 | Fill #0 | Status: CN

## 2020-11-28 ENCOUNTER — Other Ambulatory Visit (HOSPITAL_COMMUNITY): Payer: Self-pay

## 2020-11-30 ENCOUNTER — Other Ambulatory Visit (HOSPITAL_COMMUNITY): Payer: Self-pay

## 2020-12-07 ENCOUNTER — Other Ambulatory Visit (HOSPITAL_COMMUNITY): Payer: Self-pay

## 2020-12-07 MED FILL — Hyoscyamine Sulfate Tab ER 12HR 0.375 MG: ORAL | 30 days supply | Qty: 60 | Fill #0 | Status: AC

## 2020-12-07 MED FILL — Levocetirizine Dihydrochloride Tab 5 MG: ORAL | 30 days supply | Qty: 30 | Fill #0 | Status: AC

## 2020-12-07 MED FILL — Hyoscyamine Sulfate SL Tab 0.125 MG: SUBLINGUAL | 30 days supply | Qty: 60 | Fill #0 | Status: CN

## 2020-12-07 MED FILL — Montelukast Sodium Tab 10 MG (Base Equiv): ORAL | 30 days supply | Qty: 30 | Fill #0 | Status: AC

## 2020-12-10 ENCOUNTER — Ambulatory Visit (INDEPENDENT_AMBULATORY_CARE_PROVIDER_SITE_OTHER): Payer: 59 | Admitting: Gastroenterology

## 2020-12-10 ENCOUNTER — Encounter: Payer: Self-pay | Admitting: Gastroenterology

## 2020-12-10 ENCOUNTER — Other Ambulatory Visit (HOSPITAL_COMMUNITY): Payer: Self-pay

## 2020-12-10 VITALS — BP 120/80 | HR 100 | Ht 70.0 in | Wt 225.2 lb

## 2020-12-10 DIAGNOSIS — D126 Benign neoplasm of colon, unspecified: Secondary | ICD-10-CM

## 2020-12-10 DIAGNOSIS — R1084 Generalized abdominal pain: Secondary | ICD-10-CM | POA: Diagnosis not present

## 2020-12-10 DIAGNOSIS — R197 Diarrhea, unspecified: Secondary | ICD-10-CM | POA: Diagnosis not present

## 2020-12-10 DIAGNOSIS — K529 Noninfective gastroenteritis and colitis, unspecified: Secondary | ICD-10-CM | POA: Diagnosis not present

## 2020-12-10 MED ORDER — AMITRIPTYLINE HCL 25 MG PO TABS
25.0000 mg | ORAL_TABLET | Freq: Every day | ORAL | 0 refills | Status: DC
  Filled 2020-12-10: qty 30, 30d supply, fill #0

## 2020-12-10 NOTE — Progress Notes (Signed)
[facility] Progress Note  Chief Complaint: Abdominal pain diarrhea  Subjective  History: Seen in clinic consultation early February for at least a decade of chronic abdominal pain and diarrhea, prior evaluation and management by Dr. Laural Golden.  No improvement after cholecystectomy years ago. Some lab testing ordered, hyoscyamine prescribed and colonoscopy performed on March 9.  Excellent preparation, complete exam to terminal ileum.  No microscopic colitis on random biopsies.  2 diminutive tubular adenomas with recommendation to perform surveillance colonoscopy in 7 years.  Tammy Chapman is feeling about the same since I last saw her.  Hyoscyamine is not helping her generalized abdominal pain and chronic diarrhea.  She typically has 8-10 semiformed to loose BMs per day with urgency and no bleeding.  Abdominal pain is felt in various locations but mostly lower abdomen, some days more one side than the other. It has behaved in a similar fashion over many years. ROS: Cardiovascular:  no chest pain Respiratory: no dyspnea  The patient's Past Medical, Family and Social History were reviewed and are on file in the EMR.  She denies a history of depression, anxiety or panic disorder, and she denies a history of abuse.  Objective:  Med list reviewed  Current Outpatient Medications:  .  albuterol (VENTOLIN HFA) 108 (90 Base) MCG/ACT inhaler, INHALE 2 PUFFS INTO THE LUNGS EVERY 6 HOURS AS NEEDED FOR WHEEZING OR SHORTNESS OF BREATH., Disp: 18 g, Rfl: 2 .  amitriptyline (ELAVIL) 25 MG tablet, Take 1 tablet (25 mg total) by mouth at bedtime., Disp: 30 tablet, Rfl: 0 .  aspirin EC 81 MG tablet, Take 81 mg by mouth daily. Swallow whole., Disp: , Rfl:  .  budesonide-formoterol (SYMBICORT) 160-4.5 MCG/ACT inhaler, INHALE 2 PUFFS BY MOUTH 2 TIMES DAILY, Disp: 10.2 g, Rfl: 11 .  chlorpheniramine-HYDROcodone (TUSSIONEX PENNKINETIC ER) 10-8 MG/5ML SUER, Take 5 mLs by mouth every 12 (twelve) hours as needed  for cough., Disp: 140 mL, Rfl: 0 .  hyoscyamine (LEVBID) 0.375 MG 12 hr tablet, TAKE 1 TABLET BY MOUTH 2 TIMES DAILY, Disp: 60 tablet, Rfl: 1 .  Hyoscyamine Sulfate SL 0.125 MG SUBL, PLACE 1 TABLET UNDER THE TONGUE EVERY 6 HOURS AS NEEDED, Disp: 30 tablet, Rfl: 1 .  Hyoscyamine Sulfate SL 0.125 MG SUBL, PLACE 2 TABLETS UNDER THE TONGUE EVERY 6 HOURS AS NEEDED, Disp: 60 tablet, Rfl: 1 .  levocetirizine (XYZAL) 5 MG tablet, TAKE 1 TABLET BY MOUTH EVERY EVENING, Disp: 30 tablet, Rfl: 5 .  medroxyPROGESTERone (PROVERA) 10 MG tablet, TAKE 1 TABLET BY MOUTH ONCE A DAY FOR 2 WEEKS, REPEAT AS DIRECTED, Disp: 14 tablet, Rfl: 3 .  metoCLOPramide (REGLAN) 5 MG tablet, TAKE 1 TABLET BY MOUTH EVERY 12 HOURS AS NEEDED FOR UP TO 2 DOSES FOR NAUSEA. TAKE 30-45 MINUTES BEFORE EVENING AND MORNING DOSES OF BOWEL PREP SOLN, Disp: 2 tablet, Rfl: 0 .  montelukast (SINGULAIR) 10 MG tablet, TAKE 1 TABLET BY MOUTH AT BEDTIME, Disp: 30 tablet, Rfl: 10 .  omalizumab (XOLAIR) 150 MG/ML prefilled syringe, INJECT 300 MG INTO THE SKIN EVERY 28 DAYS., Disp: 2 mL, Rfl: 11 .  PHEXXI 1.8-1-0.4 % GEL, PLACE 1 APPLICATORFUL VAGINALLY AS NEEDED. IMMEDIATELY BEFORE OR UP TO 1 HOUR BEFORE SEX, Disp: 60 g, Rfl: 11 .  SUMAtriptan (IMITREX) 100 MG tablet, Take 1 tablet (100 mg total) by mouth every 2 (two) hours as needed for migraine. May repeat in 2 hours if headache persists or recurs., Disp: 30 tablet, Rfl: 0 .  Tiotropium  Bromide Monohydrate 1.25 MCG/ACT AERS, INHALE 2 PUFFS INTO THE LUNGS DAILY., Disp: 4 g, Rfl: 12   Vital signs in last 24 hrs: Vitals:   12/10/20 1603  BP: 120/80  Pulse: 100   Wt Readings from Last 3 Encounters:  12/10/20 225 lb 3.2 oz (102.2 kg)  10/24/20 217 lb (98.4 kg)  10/01/20 215 lb (97.5 kg)    Physical Exam  Well-appearing  HEENT: sclera anicteric, oral mucosa moist without lesions  Neck: supple, no thyromegaly, JVD or lymphadenopathy  Cardiac: RRR without murmurs, S1S2 heard, no peripheral  edema  Pulm: clear to auscultation bilaterally, normal RR and effort noted  Abdomen: soft, mild right side tenderness, with active bowel sounds. No guarding or palpable hepatosplenomegaly.  Skin; warm and dry, no jaundice or rash  Labs:  CBC Latest Ref Rng & Units 09/25/2020 08/15/2019 07/02/2019  WBC 4.0 - 10.5 K/uL 7.2 4.8 12.6(H)  Hemoglobin 12.0 - 15.0 g/dL 14.2 12.1 9.5(L)  Hematocrit 36.0 - 46.0 % 43.3 38.3 29.9(L)  Platelets 150.0 - 400.0 K/uL 225.0 245.0 168   CMP Latest Ref Rng & Units 09/25/2020 07/02/2019 02/04/2019  Glucose 70 - 99 mg/dL 93 - 93  BUN 6 - 23 mg/dL 9 - 10  Creatinine 0.40 - 1.20 mg/dL 0.75 0.66 0.53(L)  Sodium 135 - 145 mEq/L 136 - 137  Potassium 3.5 - 5.1 mEq/L 3.7 - 4.2  Chloride 96 - 112 mEq/L 101 - 100  CO2 19 - 32 mEq/L 29 - 22  Calcium 8.4 - 10.5 mg/dL 9.4 - 8.8  Total Protein 6.0 - 8.3 g/dL 7.4 - 6.7  Total Bilirubin 0.2 - 1.2 mg/dL 0.6 - 0.6  Alkaline Phos 39 - 117 U/L 86 - 67  AST 0 - 37 U/L 8 - 6  ALT 0 - 35 U/L 12 - 8   nml TSH  nml IgA level and negative tTG IgA ___________________________________________ Radiologic studies:   ____________________________________________ Other:  1. Surgical [P], colon nos, random sites - BENIGN COLONIC MUCOSA. - NO ACTIVE INFLAMMATION OR EVIDENCE OF MICROSCOPIC COLITIS. - NO DYSPLASIA OR MALIGNANCY. 2. Surgical [P], colon, ascending and sigmoid, polyp (2) - TUBULAR ADENOMA (X2 FRAGMENTS). - NO HIGH GRADE DYSPLASIA OR MALIGNANCY. _____________________________________________ Assessment & Plan  Assessment: Encounter Diagnoses  Name Primary?  . Generalized abdominal pain Yes  . Chronic diarrhea   . 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  The pain is a prominent symptom, so does not surprise that antispasmodic therapy is not helped much.  We cannot use Viberzi since she has had cholecystectomy (and Lotronex can be challenging to get with the  restrictions on a). We discussed a trial of TCA or duloxetine, even though she does not have a concomitant mood disorder.  Discussed the rationale for use of these medicines for various chronic pain syndromes including IBS.  Lastly, I recommended a CT enterography to be sure she does not have more proximal small bowel Crohn's disease.  Plan: CT enterography  Amitriptyline 25 mg nightly.  Trial at this dose for least 2 weeks to make sure it is tolerated before considering increasing dose.  Discontinue hyoscyamine (or dicyclomine)  30 minutes were spent on this encounter (including chart review, history/exam, counseling/coordination of care, and documentation) > 50% of that time was spent on counseling and coordination of care.  Topics discussed included: IBS and his treatment, colon polyps and surveillance.  Nelida Meuse III

## 2020-12-10 NOTE — Patient Instructions (Signed)
If you are age 29 or older, your body mass index should be between 23-30. Your Body mass index is 32.31 kg/m. If this is out of the aforementioned range listed, please consider follow up with your Primary Care Provider.  If you are age 62 or younger, your body mass index should be between 19-25. Your Body mass index is 32.31 kg/m. If this is out of the aformentioned range listed, please consider follow up with your Primary Care Provider.   Stop hyoscyamine!!!   You have been scheduled for a CT scan of the abdomen and pelvis at [facility] ([address]---this is in the same building as [employer]).   You are scheduled on 12-19-2020 at 1230pm. You should arrive 15 minutes prior to your appointment time for registration. Please follow the written instructions below on the day of your exam:  WARNING: IF YOU ARE ALLERGIC TO IODINE/X-RAY DYE, PLEASE NOTIFY RADIOLOGY IMMEDIATELY AT [phone]! YOU WILL BE GIVEN A 13 HOUR PREMEDICATION PREP.  1) Do not eat or drink anything after 830am (4 hours prior to your test)  You may take any medications as prescribed with a small amount of water, if necessary. If you take any of the following medications: METFORMIN, GLUCOPHAGE, GLUCOVANCE, AVANDAMET, RIOMET, FORTAMET, [place] MET, JANUMET, GLUMETZA or METAGLIP, you MAY be asked to HOLD this medication 48 hours AFTER the exam.  The purpose of you drinking the oral contrast is to aid in the visualization of your intestinal tract. The contrast solution may cause some diarrhea. Depending on your individual set of symptoms, you may also receive an intravenous injection of x-ray contrast/dye. Plan on being at [facility] for 30 minutes or longer, depending on the type of exam you are having performed.  This test typically takes 30-45 minutes to complete.  If you have any questions regarding your exam or if you need to reschedule, you may call the CT department at [phone]  between the hours of 8:00 am and 5:00 pm, Monday-Friday.  ________________________________________________________________________  It was a pleasure to see you today!  Dr. Loletha Carrow

## 2020-12-11 ENCOUNTER — Other Ambulatory Visit (HOSPITAL_COMMUNITY): Payer: Self-pay

## 2020-12-12 ENCOUNTER — Telehealth: Payer: 59 | Admitting: Family

## 2020-12-12 DIAGNOSIS — A084 Viral intestinal infection, unspecified: Secondary | ICD-10-CM | POA: Diagnosis not present

## 2020-12-12 MED ORDER — ONDANSETRON HCL 4 MG PO TABS: 4.0000 mg | ORAL_TABLET | Freq: Three times a day (TID) | ORAL | 0 refills | Status: DC | PRN

## 2020-12-12 NOTE — Progress Notes (Signed)
We are sorry that you are not feeling well. Here is how we plan to help!  Based on what you have shared with me it looks like you have a Virus that is irritating your GI tract.  Vomiting is the forceful emptying of a portion of the stomach's content through the mouth.  Although nausea and vomiting can make you feel miserable, it's important to remember that these are not diseases, but rather symptoms of an underlying illness.  When we treat short term symptoms, we always caution that any symptoms that persist should be fully evaluated in a medical office.  I have prescribed a medication that will help alleviate your symptoms and allow you to stay hydrated:  Zofran 4 mg 1 tablet every 8 hours as needed for nausea and vomiting.  HOME CARE:  Drink clear liquids.  This is very important! Dehydration (the lack of fluid) can lead to a serious complication.  Start off with 1 tablespoon every 5 minutes for 8 hours.  You may begin eating bland foods after 8 hours without vomiting.  Start with saltine crackers, white bread, rice, mashed potatoes, applesauce.  After 48 hours on a bland diet, you may resume a normal diet.  Try to go to sleep.  Sleep often empties the stomach and relieves the need to vomit.  GET HELP RIGHT AWAY IF:   Your symptoms do not improve or worsen within 2 days after treatment.  You have a fever for over 3 days.  You cannot keep down fluids after trying the medication.  MAKE SURE YOU:   Understand these instructions.  Will watch your condition.  Will get help right away if you are not doing well or get worse.   Thank you for choosing an e-visit. Your e-visit answers were reviewed by a board certified advanced clinical practitioner to complete your personal care plan. Depending upon the condition, your plan could have included both over the counter or prescription medications. Please review your pharmacy choice. Be sure that the pharmacy you have chosen is open so  that you can pick up your prescription now.  If there is a problem you may message your provider in MyChart to have the prescription routed to another pharmacy. Your safety is important to us. If you have drug allergies check your prescription carefully.  For the next 24 hours, you can use MyChart to ask questions about today's visit, request a non-urgent call back, or ask for a work or school excuse from your e-visit provider. You will get an e-mail in the next two days asking about your experience. I hope that your e-visit has been valuable and will speed your recovery.  Approximately 5 minutes was spent documenting and reviewing patient's chart.     

## 2020-12-17 ENCOUNTER — Ambulatory Visit (INDEPENDENT_AMBULATORY_CARE_PROVIDER_SITE_OTHER)
Admission: RE | Admit: 2020-12-17 | Discharge: 2020-12-17 | Disposition: A | Discharge status: Home / Self Care | Payer: 59 | Source: Ambulatory Visit | Attending: Gastroenterology | Admitting: Gastroenterology

## 2020-12-17 ENCOUNTER — Other Ambulatory Visit: Payer: Self-pay

## 2020-12-17 ENCOUNTER — Other Ambulatory Visit (HOSPITAL_COMMUNITY): Payer: Self-pay

## 2020-12-17 DIAGNOSIS — R1084 Generalized abdominal pain: Secondary | ICD-10-CM

## 2020-12-17 DIAGNOSIS — D126 Benign neoplasm of colon, unspecified: Secondary | ICD-10-CM | POA: Diagnosis not present

## 2020-12-17 DIAGNOSIS — R109 Unspecified abdominal pain: Secondary | ICD-10-CM | POA: Diagnosis not present

## 2020-12-17 DIAGNOSIS — R197 Diarrhea, unspecified: Secondary | ICD-10-CM | POA: Diagnosis not present

## 2020-12-17 DIAGNOSIS — K529 Noninfective gastroenteritis and colitis, unspecified: Secondary | ICD-10-CM

## 2020-12-17 MED ORDER — IOHEXOL 300 MG/ML  SOLN
100.0000 mL | Freq: Once | INTRAMUSCULAR | Status: AC | PRN
  Administered 2020-12-17: 100 mL via INTRAVENOUS

## 2020-12-17 MED FILL — Omalizumab Subcutaneous Soln Prefilled Syringe 150 MG/ML: SUBCUTANEOUS | 28 days supply | Qty: 2 | Fill #1 | Status: AC

## 2020-12-19 ENCOUNTER — Encounter: Payer: 59 | Admitting: Internal Medicine

## 2020-12-19 ENCOUNTER — Ambulatory Visit (HOSPITAL_COMMUNITY): Payer: 59

## 2020-12-20 ENCOUNTER — Other Ambulatory Visit (HOSPITAL_COMMUNITY): Payer: Self-pay

## 2021-01-03 ENCOUNTER — Other Ambulatory Visit (HOSPITAL_COMMUNITY): Payer: Self-pay

## 2021-01-03 MED FILL — Montelukast Sodium Tab 10 MG (Base Equiv): ORAL | 30 days supply | Qty: 30 | Fill #1 | Status: AC

## 2021-01-03 MED FILL — Tiotropium Bromide Monohydrate Inhal Aerosol 1.25 MCG/ACT: RESPIRATORY_TRACT | 30 days supply | Qty: 4 | Fill #1 | Status: AC

## 2021-01-03 MED FILL — Levocetirizine Dihydrochloride Tab 5 MG: ORAL | 30 days supply | Qty: 30 | Fill #1 | Status: AC

## 2021-01-04 ENCOUNTER — Other Ambulatory Visit (HOSPITAL_COMMUNITY): Payer: Self-pay

## 2021-01-15 ENCOUNTER — Other Ambulatory Visit (HOSPITAL_COMMUNITY): Payer: Self-pay

## 2021-01-17 ENCOUNTER — Other Ambulatory Visit (HOSPITAL_COMMUNITY): Payer: Self-pay

## 2021-01-17 MED FILL — Omalizumab Subcutaneous Soln Prefilled Syringe 150 MG/ML: SUBCUTANEOUS | 28 days supply | Qty: 2 | Fill #2 | Status: AC

## 2021-01-22 ENCOUNTER — Other Ambulatory Visit (HOSPITAL_COMMUNITY)
Admission: RE | Admit: 2021-01-22 | Discharge: 2021-01-22 | Disposition: A | Discharge status: Home / Self Care | Payer: 59 | Source: Ambulatory Visit | Attending: Pulmonary Disease | Admitting: Pulmonary Disease

## 2021-01-25 ENCOUNTER — Other Ambulatory Visit: Payer: Self-pay

## 2021-01-25 ENCOUNTER — Ambulatory Visit: Payer: 59

## 2021-01-25 DIAGNOSIS — J4540 Moderate persistent asthma, uncomplicated: Secondary | ICD-10-CM

## 2021-01-25 DIAGNOSIS — U071 COVID-19: Secondary | ICD-10-CM

## 2021-01-25 LAB — PULMONARY FUNCTION TEST
DL/VA % pred: 110 %
DL/VA: 4.88 ml/min/mmHg/L
DLCO cor % pred: 107 %
DLCO cor: 29.18 ml/min/mmHg
DLCO unc % pred: 107 %
DLCO unc: 29.18 ml/min/mmHg
FEF 25-75 Post: 5.55 L/sec
FEF 25-75 Pre: 4.82 L/sec
FEF2575-%Change-Post: 15 %
FEF2575-%Pred-Post: 142 %
FEF2575-%Pred-Pre: 124 %
FEV1-%Change-Post: 4 %
FEV1-%Pred-Post: 110 %
FEV1-%Pred-Pre: 106 %
FEV1-Post: 4.23 L
FEV1-Pre: 4.05 L
FEV1FVC-%Change-Post: 0 %
FEV1FVC-%Pred-Pre: 100 %
FEV6-%Change-Post: 3 %
FEV6-%Pred-Post: 109 %
FEV6-%Pred-Pre: 105 %
FEV6-Post: 4.91 L
FEV6-Pre: 4.75 L
FEV6FVC-%Pred-Post: 101 %
FEV6FVC-%Pred-Pre: 101 %
FVC-%Change-Post: 3 %
FVC-%Pred-Post: 107 %
FVC-%Pred-Pre: 104 %
FVC-Post: 4.91 L
FVC-Pre: 4.75 L
Post FEV1/FVC ratio: 86 %
Post FEV6/FVC ratio: 100 %
Pre FEV1/FVC ratio: 85 %
Pre FEV6/FVC Ratio: 100 %
RV % pred: 130 %
RV: 2.15 L
TLC % pred: 115 %
TLC: 6.9 L

## 2021-01-30 ENCOUNTER — Other Ambulatory Visit (HOSPITAL_COMMUNITY): Payer: Self-pay

## 2021-01-30 MED FILL — Tiotropium Bromide Monohydrate Inhal Aerosol 1.25 MCG/ACT: RESPIRATORY_TRACT | 30 days supply | Qty: 4 | Fill #2 | Status: AC

## 2021-01-30 MED FILL — Budesonide-Formoterol Fumarate Dihyd Aerosol 160-4.5 MCG/ACT: RESPIRATORY_TRACT | 30 days supply | Qty: 10.2 | Fill #1 | Status: AC

## 2021-01-31 ENCOUNTER — Other Ambulatory Visit: Payer: Self-pay

## 2021-01-31 ENCOUNTER — Other Ambulatory Visit (HOSPITAL_COMMUNITY): Payer: Self-pay

## 2021-01-31 ENCOUNTER — Ambulatory Visit (INDEPENDENT_AMBULATORY_CARE_PROVIDER_SITE_OTHER): Payer: 59 | Admitting: Emergency Medicine

## 2021-01-31 ENCOUNTER — Encounter: Payer: Self-pay | Admitting: Emergency Medicine

## 2021-01-31 DIAGNOSIS — J4542 Moderate persistent asthma with status asthmaticus: Secondary | ICD-10-CM | POA: Diagnosis not present

## 2021-01-31 DIAGNOSIS — J301 Allergic rhinitis due to pollen: Secondary | ICD-10-CM

## 2021-01-31 DIAGNOSIS — K219 Gastro-esophageal reflux disease without esophagitis: Secondary | ICD-10-CM

## 2021-01-31 MED ORDER — ALBUTEROL SULFATE HFA 108 (90 BASE) MCG/ACT IN AERS
2.0000 | INHALATION_SPRAY | Freq: Four times a day (QID) | RESPIRATORY_TRACT | 2 refills | Status: DC | PRN
  Filled 2021-01-31: qty 18, 25d supply, fill #0
  Filled 2021-11-28: qty 18, 25d supply, fill #1

## 2021-01-31 NOTE — Assessment & Plan Note (Signed)
Pulmonary function testing was stable, normal airflows.  (Positive methacholine in 2019).  Overall stable at this time.  The Xolair and the Spiriva have helped her significantly.  Plan to continue both as well as the Symbicort.  Recheck her IgE and eosinophil count next visit  We reviewed your pulmonary function testing today.  Your airflows are stable compared with 3 years ago which is good news. Please continue Symbicort 2 puffs twice a day.  Rinse and gargle after using. Continue Spiriva 2 puffs once daily. Continue your Xolair injections once a month. Continue your Singulair and Xyzal as you have been taking them. We will check your IgE and eosinophil count at your next office visit. Follow with Dr Lamonte Sakai in 6 months or sooner if you have any problems

## 2021-01-31 NOTE — Progress Notes (Signed)
Subjective:    Patient ID: Tammy Chapman, female    DOB: Jan 06, 1991, 30 y.o.   MRN: [id number]   HPI  ROV 04/11/20 --follow-up visit 30 year old woman with moderate persistent asthma, some associated upper airway irritation and lability with cough, impacted by GERD and allergic rhinitis.  She has multiple allergies and an elevated IgE.  She started Xolair in early March. She still has nasal congestion, feels that her overall sx have been better controlled. Not waking up at night to use her SABA. She caught a URI about 3 weeks ago and has had increased sx, green mucous since. Her last prednisone was about late May. She recently discovered that she may be allegic to corn products > has noticed some mouth and tongue swelling. ? Which substances, she has not seen Allergy.  Currently maintained on Symbicort, Singulair, Xyzal, Symbicort, nasal steroid.   ROV 01/31/2021 --- 30 year old woman who has moderate persistent asthma as well as upper airway irritation syndrome.  Both are impacted by her GERD and allergic rhinitis due to multiple allergies.  She has an elevated IgE, is on Xolair since 10/2019, getting every 4 weeks.  We have been managing her on Symbicort.  Spiriva Respimat was added earlier this year and she feels that she has benefited.  Remains on Singulair, Xyzal.  She was dealing with clearing cough and was using Tussionex, not requiring currently.  Her breathing and cough are more difficult at the end of the month before it is time for next Xolair   Review of Systems As per HPI       Objective:   Physical Exam Today's Vitals   01/31/21 1630  BP: 120/80  Pulse: 84  Temp: (!) 97.5 F (36.4 C)  TempSrc: Temporal  SpO2: 95%  Weight: 220 lb (99.8 kg)  Height: 5\' 10"  (1.778 m)   Body mass index is 31.57 kg/m.   Gen: Pleasant, well-nourished, in no distress,  normal affect  ENT: No lesions,  mouth clear,  oropharynx clear, no postnasal drip  Neck: No JVD, no UA noise  Lungs:  No use of accessory muscles, no crackles or wheezing on normal respiration, no wheeze on forced expiration  Cardiovascular: RRR, heart sounds normal, no murmur or gallops, no peripheral edema  Musculoskeletal: No deformities, no cyanosis or clubbing  Neuro: alert, awake, non focal  Skin: Warm, no lesions or rash     Assessment & Plan:  Chronic asthma Pulmonary function testing was stable, normal airflows.  (Positive methacholine in 2019).  Overall stable at this time.  The Xolair and the Spiriva have helped her significantly.  Plan to continue both as well as the Symbicort.  Recheck her IgE and eosinophil count next visit  We reviewed your pulmonary function testing today.  Your airflows are stable compared with 3 years ago which is good news. Please continue Symbicort 2 puffs twice a day.  Rinse and gargle after using. Continue Spiriva 2 puffs once daily. Continue your Xolair injections once a month. Continue your Singulair and Xyzal as you have been taking them. We will check your IgE and eosinophil count at your next office visit. Follow with Dr Lamonte Sakai in 6 months or sooner if you have any problems  GERD (gastroesophageal reflux disease) Not currently on therapy.  Appears to be getting away with this.  We may need to add back depending on how her asthma and cough behave.  Allergic rhinitis Singulair, Xyzal, Xolair   Baltazar Apo, MD, PhD 01/31/2021, 5:03 PM  [facility] [phone] or if no answer [phone]

## 2021-01-31 NOTE — Assessment & Plan Note (Addendum)
Singulair, Xyzal, Xolair

## 2021-01-31 NOTE — Addendum Note (Signed)
Addended by: Dessie Coma on: 01/31/2021 05:06 PM   Modules accepted: Orders

## 2021-01-31 NOTE — Patient Instructions (Addendum)
We reviewed your pulmonary function testing today.  Your airflows are stable compared with 3 years ago which is good news. Please continue Symbicort 2 puffs twice a day.  Rinse and gargle after using. Continue Spiriva 2 puffs once daily. Continue your Xolair injections once a month. Continue your Singulair and Xyzal as you have been taking them. We will check your IgE and eosinophil count at your next office visit. Follow with Dr Lamonte Sakai in 6 months or sooner if you have any problems

## 2021-01-31 NOTE — Assessment & Plan Note (Signed)
Not currently on therapy.  Appears to be getting away with this.  We may need to add back depending on how her asthma and cough behave.

## 2021-02-01 ENCOUNTER — Other Ambulatory Visit (HOSPITAL_COMMUNITY): Payer: Self-pay

## 2021-02-08 ENCOUNTER — Other Ambulatory Visit (HOSPITAL_COMMUNITY): Payer: Self-pay

## 2021-02-13 ENCOUNTER — Other Ambulatory Visit (HOSPITAL_COMMUNITY): Payer: Self-pay

## 2021-02-13 MED FILL — Levocetirizine Dihydrochloride Tab 5 MG: ORAL | 30 days supply | Qty: 30 | Fill #2 | Status: AC

## 2021-02-13 MED FILL — Montelukast Sodium Tab 10 MG (Base Equiv): ORAL | 30 days supply | Qty: 30 | Fill #2 | Status: AC

## 2021-02-15 ENCOUNTER — Telehealth: Payer: 59 | Admitting: Family Medicine

## 2021-02-15 DIAGNOSIS — H60331 Swimmer's ear, right ear: Secondary | ICD-10-CM | POA: Diagnosis not present

## 2021-02-15 MED ORDER — CIPRO HC 0.2-1 % OT SUSP: 3.0000 [drp] | Freq: Two times a day (BID) | OTIC | 0 refills | Status: DC

## 2021-02-15 NOTE — Progress Notes (Signed)
Tammy Chapman are scheduled for a virtual visit with your provider today.    Just as we do with appointments in the office, we must obtain your consent to participate.  Your consent will be active for this visit and any virtual visit you may have with one of our providers in the next 365 days.    If you have a MyChart account, I can also send a copy of this consent to you electronically.  All virtual visits are billed to your insurance company just like a traditional visit in the office.  As this is a virtual visit, video technology does not allow for your provider to perform a traditional examination.  This may limit your provider's ability to fully assess your condition.  If your provider identifies any concerns that need to be evaluated in person or the need to arrange testing such as labs, EKG, etc, we will make arrangements to do so.    Although advances in technology are sophisticated, we cannot ensure that it will always work on either your end or our end.  If the connection with a video visit is poor, we may have to switch to a telephone visit.  With either a video or telephone visit, we are not always able to ensure that we have a secure connection.   I need to obtain your verbal consent now.   Are you willing to proceed with your visit today?   Lelaina Oatis has provided verbal consent on 02/15/2021 for a virtual visit (video or telephone).  E Visit for Swimmer's Ear  We are sorry that you are not feeling well. Here is how we plan to help!  I have prescribed: Ciprofloxin 0.2% and hydrocortisone 1% otic suspension 3 drops in affected ears twice daily for 7 days    In certain cases swimmer's ear may progress to a more serious bacterial infection of the middle or inner ear.  If you have a fever 102 and up and significantly worsening symptoms, this could indicate a more serious infection moving to the middle/inner and needs face to face evaluation in an office by a provider.  Your symptoms  should improve over the next 3 days and should resolve in about 7 days.  HOME CARE:  Wash your hands frequently. Do not place the tip of the bottle on your ear or touch it with your fingers. You can take Acetominophen 650 mg every 4-6 hours as needed for pain.  If pain is severe or moderate, you can apply a heating pad (set on low) or hot water bottle (wrapped in a towel) to outer ear for 20 minutes.  This will also increase drainage. Avoid ear plugs Do not use Q-tips After showers, help the water run out by tilting your Chapman to one side.  GET HELP RIGHT AWAY IF:  Fever is over 102.2 degrees. You develop progressive ear pain or hearing loss. Ear symptoms persist longer than 3 days after treatment.  MAKE SURE YOU:  Understand these instructions. Will watch your condition. Will get help right away if you are not doing well or get worse.  TO PREVENT SWIMMER'S EAR: Use a bathing cap or custom fitted swim molds to keep your ears dry. Towel off after swimming to dry your ears. Tilt your Chapman or pull your earlobes to allow the water to escape your ear canal. If there is still water in your ears, consider using a hairdryer on the lowest setting.   Thank you for choosing an e-visit.  Your e-visit answers were reviewed by a board certified advanced clinical practitioner to complete your personal care plan. Depending upon the condition, your plan could have included both over the counter or prescription medications.  Please review your pharmacy choice. Make sure the pharmacy is open so you can pick up prescription now. If there is a problem, you may contact your provider through [employer] and have the prescription routed to another pharmacy.  Your safety is important to [place]. If you have drug allergies check your prescription carefully.   For the next 24 hours you can use MyChart to ask questions about today's visit, request a non-urgent call back, or ask for a work or school  excuse. You will get an email in the next two days asking about your experience. I hope that your e-visit has been valuable and will speed your recovery.   I provided 7 minutes of non face-to-face time during this encounter for chart review and documentation.     Perlie Mayo, NP 02/15/2021  4:09 PM

## 2021-02-17 ENCOUNTER — Telehealth: Payer: 59 | Admitting: Nurse Practitioner

## 2021-02-17 DIAGNOSIS — L719 Rosacea, unspecified: Secondary | ICD-10-CM

## 2021-02-18 MED ORDER — DOXYCYCLINE HYCLATE 100 MG PO TABS: 100.0000 mg | ORAL_TABLET | Freq: Every day | ORAL | 0 refills | Status: AC

## 2021-02-18 NOTE — Progress Notes (Signed)
E visit for Rosacea We are sorry that you are not feeling well. Here is how we plan to help! Based on what you shared with me it looks like you have Rosacea.  Rosacea is a common chronic skin condition that usually only affects the face and eyes.  Occasionally, the neck, chest, or other areas may be involved.  Characterized by redness, pimples, and broken blood vessels, rosacea tends to begin after middle age (between the ages of 51 and 28).  It is more common in fair-skinned people and women in menopause. It may appear differently in dark skinned people but rosacea effects all ethnic groups.  The cause of rosacea is not fully understood. We do know that rosacea is worsened by various trigger factors including, spicy or hot foods, hot beverages such as coffee or tea, alcohol, and sun exposure just to name a few.  Signs of rosacea may vary greatly from person to person. In some individuals it may only flare up from time to time.   I have prescribed: An oral antibiotic that may lessen the redness of your skin called doxycycline 100mg  daily.  This will be a two week prescription, we recommend scheduling with a dermatologist or your primary care doctor while on the medicine for an evaluation as well. You may require staying on this antibiotic longer as well and a dermatologist or even your primary care doctor can help with that if you are improving and would like to stay on this antibiotic daily long term.     HOME CARE: Keep a record of triggers, such as stress, weather, or certain foods or drinks. Consider limiting hot or spicy foods and alcohol. Always use sunscreen that protects against UVA and UVB rays and has a sun-protecting factor (SPF) of 15 or higher. Avoid putting steroids on the skin sores. Steroids may make rosacea worse.  You may use small amounts of water based cosmetic while using this medication.  Apply cosmetics after cream has dried. If you shave your face, use an electric  razor Don't scrub your skin or use sponges, brushes, or other abrasive tools. Doing so can irritate your skin. GET HELP RIGHT AWAY IF: If your rosacea gets worse or is not better within 4 weeks. If a new skin condition or rash develops. Loss of feeling or tingling of treated area Nausea  MAKE SURE YOU   Understand these instructions. Will watch your condition. Will get help right away if you are not doing well or get worse. Meds ordered this encounter  Medications   doxycycline (VIBRA-TABS) 100 MG tablet    Sig: Take 1 tablet (100 mg total) by mouth daily for 14 days.    Dispense:  14 tablet    Refill:  0     Thank you for choosing an e-visit.  Your e-visit answers were reviewed by a board certified advanced clinical practitioner to complete your personal care plan. Depending upon the condition, your plan could have included both over the counter or prescription medications.  Please review your pharmacy choice. Make sure the pharmacy is open so you can pick up prescription now. If there is a problem, you may contact your provider through [employer] and have the prescription routed to another pharmacy.  Your safety is important to [place]. If you have drug allergies check your prescription carefully.   For the next 24 hours you can use MyChart to ask questions about today's visit, request a non-urgent call back, or ask for a work or  school excuse. You will get an email in the next two days asking about your experience. I hope that your e-visit has been valuable and will speed your recovery.   I spent approximately 7 minutes reviewing the patient's chart, images and coordinating her plan of care today.

## 2021-02-19 ENCOUNTER — Other Ambulatory Visit (HOSPITAL_COMMUNITY): Payer: Self-pay

## 2021-02-19 ENCOUNTER — Telehealth: Payer: 59 | Admitting: Nurse Practitioner

## 2021-02-19 DIAGNOSIS — H60331 Swimmer's ear, right ear: Secondary | ICD-10-CM

## 2021-02-19 MED ORDER — NEOMYCIN-POLYMYXIN-HC 3.5-10000-1 OT SOLN
3.0000 [drp] | Freq: Four times a day (QID) | OTIC | 0 refills | Status: DC
  Filled 2021-02-19: qty 10, 8d supply, fill #0

## 2021-02-19 MED FILL — Omalizumab Subcutaneous Soln Prefilled Syringe 150 MG/ML: SUBCUTANEOUS | 28 days supply | Qty: 2 | Fill #3 | Status: AC

## 2021-02-19 NOTE — Progress Notes (Signed)
E Visit for Swimmer's Ear  We are sorry that you are not feeling well. Here is how we plan to help!  Based on what you have shared with me it looks like you have swimmers ear. Swimmer's ear is a redness or swelling, irritation, or infection of your outer ear canal.  These symptoms usually occur within a few days of swimming.  Your ear canal is a tube that goes from the opening of the ear to the eardrum.  When water stays in your ear canal, germs can grow.  This is a painful condition that often happens to children and swimmers of all ages.  It is not contagious and oral antibiotics are not required to treat uncomplicated swimmer's ear.  The usual symptoms include: Itching inside the ear, Redness or a sense of swelling in the ear, Pain when the ear is tugged on when pressure is placed on the ear, Pus draining from the infected ear.  Based on what you have told me you may have a bacterial infection. In addition to the ear drops I have prescribed an oral antibiotic: cortisoprin otic drops sent to pharmacy. ( Unable to get ciprodex from pharmacy.0  In certain cases swimmer's ear may progress to a more serious bacterial infection of the middle or inner ear.  If you have a fever 102 and up and significantly worsening symptoms, this could indicate a more serious infection moving to the middle/inner and needs face to face evaluation in an office by a provider.  Your symptoms should improve over the next 3 days and should resolve in about 7 days.  HOME CARE:  Wash your hands frequently. Do not place the tip of the bottle on your ear or touch it with your fingers. You can take Acetominophen 650 mg every 4-6 hours as needed for pain.  If pain is severe or moderate, you can apply a heating pad (set on low) or hot water bottle (wrapped in a towel) to outer ear for 20 minutes.  This will also increase drainage. Avoid ear plugs Do not use Q-tips After showers, help the water run out by tilting your head to one  side.  GET HELP RIGHT AWAY IF:  Fever is over 102.2 degrees. You develop progressive ear pain or hearing loss. Ear symptoms persist longer than 3 days after treatment.  MAKE SURE YOU:  Understand these instructions. Will watch your condition. Will get help right away if you are not doing well or get worse.  TO PREVENT SWIMMER'S EAR: Use a bathing cap or custom fitted swim molds to keep your ears dry. Towel off after swimming to dry your ears. Tilt your head or pull your earlobes to allow the water to escape your ear canal. If there is still water in your ears, consider using a hairdryer on the lowest setting.   Thank you for choosing an e-visit.  Your e-visit answers were reviewed by a board certified advanced clinical practitioner to complete your personal care plan. Depending upon the condition, your plan could have included both over the counter or prescription medications.  Please review your pharmacy choice. Make sure the pharmacy is open so you can pick up prescription now. If there is a problem, you may contact your provider through [employer] and have the prescription routed to another pharmacy.  Your safety is important to [place]. If you have drug allergies check your prescription carefully.   For the next 24 hours you can use MyChart to ask questions about today's  visit, request a non-urgent call back, or ask for a work or school excuse. You will get an email in the next two days asking about your experience. I hope that your e-visit has been valuable and will speed your recovery.  5-10 minutes spent reviewing and documenting in chart.

## 2021-02-20 ENCOUNTER — Other Ambulatory Visit (HOSPITAL_COMMUNITY): Payer: Self-pay

## 2021-02-21 ENCOUNTER — Other Ambulatory Visit (HOSPITAL_COMMUNITY): Payer: Self-pay

## 2021-02-27 ENCOUNTER — Other Ambulatory Visit: Payer: Self-pay

## 2021-02-27 ENCOUNTER — Other Ambulatory Visit (HOSPITAL_COMMUNITY): Payer: Self-pay

## 2021-02-27 ENCOUNTER — Ambulatory Visit: Payer: 59 | Admitting: Internal Medicine

## 2021-02-27 ENCOUNTER — Encounter: Payer: Self-pay | Admitting: Internal Medicine

## 2021-02-27 VITALS — BP 118/82 | HR 82 | Temp 98.0°F | Resp 16 | Ht 70.0 in | Wt 222.0 lb

## 2021-02-27 DIAGNOSIS — K219 Gastro-esophageal reflux disease without esophagitis: Secondary | ICD-10-CM

## 2021-02-27 DIAGNOSIS — Z0001 Encounter for general adult medical examination with abnormal findings: Secondary | ICD-10-CM | POA: Diagnosis not present

## 2021-02-27 DIAGNOSIS — J4520 Mild intermittent asthma, uncomplicated: Secondary | ICD-10-CM | POA: Diagnosis not present

## 2021-02-27 DIAGNOSIS — Z23 Encounter for immunization: Secondary | ICD-10-CM | POA: Diagnosis not present

## 2021-02-27 DIAGNOSIS — Z1331 Encounter for screening for depression: Secondary | ICD-10-CM | POA: Insufficient documentation

## 2021-02-27 DIAGNOSIS — Z1159 Encounter for screening for other viral diseases: Secondary | ICD-10-CM | POA: Insufficient documentation

## 2021-02-27 LAB — LIPID PANEL
Cholesterol: 201 mg/dL — ABNORMAL HIGH (ref 0–200)
HDL: 53.3 mg/dL (ref >39.00)
LDL Cholesterol: 129 mg/dL — ABNORMAL HIGH (ref 0–99)
NonHDL: 147.21
Total CHOL/HDL Ratio: 4
Triglycerides: 89 mg/dL (ref 0.0–149.0)
VLDL: 17.8 mg/dL (ref 0.0–40.0)

## 2021-02-27 MED ORDER — DEXLANSOPRAZOLE 60 MG PO CPDR
60.0000 mg | DELAYED_RELEASE_CAPSULE | Freq: Every day | ORAL | 1 refills | Status: DC
  Filled 2021-02-27 – 2021-03-05 (×2): qty 90, 90d supply, fill #0
  Filled 2021-05-27: qty 90, 90d supply, fill #1

## 2021-02-27 MED FILL — Tiotropium Bromide Monohydrate Inhal Aerosol 1.25 MCG/ACT: RESPIRATORY_TRACT | 30 days supply | Qty: 4 | Fill #3 | Status: AC

## 2021-02-27 MED FILL — Budesonide-Formoterol Fumarate Dihyd Aerosol 160-4.5 MCG/ACT: RESPIRATORY_TRACT | 30 days supply | Qty: 10.2 | Fill #2 | Status: AC

## 2021-02-27 NOTE — Progress Notes (Signed)
Hep cGerd   Subjective:  Patient ID: Tammy Chapman, female    DOB: 02-Aug-1991  Age: 30 y.o. MRN: [id number]  CC: Annual Exam, Asthma, and Gastroesophageal Reflux  This visit occurred during the SARS-CoV-2 public health emergency.  Safety protocols were in place, including screening questions prior to the visit, additional usage of staff PPE, and extensive cleaning of exam room while observing appropriate contact time as indicated for disinfecting solutions.    HPI Tammy Chapman presents for a CPX and to establish.  She has a history of GERD, peptic ulcer disease, and IBS with chronic diarrhea and intermittent abdominal cramping.  She has had heartburn recently and has tried her husband's supply of Protonix and Nexium without much symptom relief.  She denies odynophagia, dysphagia, loss of appetite, or weight loss.  History Tammy Chapman has a past medical history of Asthma, Bronchitis, mucopurulent recurrent ([place]) (01/19/2019), Chronic asthma (01/19/2019), Loose body of right knee (02/2013), Pregnancy induced hypertension, Recurrent otitis media of both ears (01/19/2019), and Sprain and strain of medial collateral ligament of knee (02/2013).   She has a past surgical history that includes Esophagogastroduodenoscopy (05/15/2011); Cholecystectomy (01/20/2008); Tympanostomy tube placement (1999, 2011); Tonsillectomy and adenoidectomy (1999); Ear tube removal (2002); Knee arthroscopy (Right, 03/15/2013); Cesarean section (N/A, 01/08/2017); and Cesarean section (N/A, 07/01/2019).   Her family history includes COPD in her maternal grandfather; Celiac disease in her father; Congestive Heart Failure in her maternal grandfather; Diabetes in her maternal grandfather and mother; Heart disease in her maternal grandmother, paternal grandfather, and paternal grandmother; Kidney disease in her maternal grandfather and maternal uncle; Other in her daughter; Renal cancer in her paternal grandfather; Ulcerative colitis in her maternal  grandmother.She reports that she has never smoked. She has never used smokeless tobacco. She reports that she does not drink alcohol and does not use drugs.  Outpatient Medications Prior to Visit  Medication Sig Dispense Refill   albuterol (VENTOLIN HFA) 108 (90 Base) MCG/ACT inhaler Inhale 2 puffs into the lungs every 6 (six) hours as needed for wheezing or shortness of breath. 18 g 2   aspirin EC 81 MG tablet Take 81 mg by mouth daily. Swallow whole.     budesonide-formoterol (SYMBICORT) 160-4.5 MCG/ACT inhaler INHALE 2 PUFFS BY MOUTH 2 TIMES DAILY 10.2 g 11   doxycycline (VIBRA-TABS) 100 MG tablet Take 1 tablet (100 mg total) by mouth daily for 14 days. 14 tablet 0   hyoscyamine (LEVBID) 0.375 MG 12 hr tablet TAKE 1 TABLET BY MOUTH 2 TIMES DAILY 60 tablet 1   Hyoscyamine Sulfate SL 0.125 MG SUBL PLACE 1 TABLET UNDER THE TONGUE EVERY 6 HOURS AS NEEDED 30 tablet 1   Hyoscyamine Sulfate SL 0.125 MG SUBL PLACE 2 TABLETS UNDER THE TONGUE EVERY 6 HOURS AS NEEDED 60 tablet 1   levocetirizine (XYZAL) 5 MG tablet TAKE 1 TABLET BY MOUTH EVERY EVENING 30 tablet 5   medroxyPROGESTERone (PROVERA) 10 MG tablet TAKE 1 TABLET BY MOUTH ONCE A DAY FOR 2 WEEKS, REPEAT AS DIRECTED 14 tablet 3   montelukast (SINGULAIR) 10 MG tablet TAKE 1 TABLET BY MOUTH AT BEDTIME 30 tablet 10   omalizumab (XOLAIR) 150 MG/ML prefilled syringe INJECT 300 MG INTO THE SKIN EVERY 28 DAYS. 2 mL 11   PHEXXI 1.8-1-0.4 % GEL PLACE 1 APPLICATORFUL VAGINALLY AS NEEDED. IMMEDIATELY BEFORE OR UP TO 1 HOUR BEFORE SEX 60 g 11   SUMAtriptan (IMITREX) 100 MG tablet Take 1 tablet (100 mg total) by mouth every 2 (two) hours as needed  for migraine. May repeat in 2 hours if headache persists or recurs. 30 tablet 0   Tiotropium Bromide Monohydrate 1.25 MCG/ACT AERS INHALE 2 PUFFS INTO THE LUNGS DAILY. 4 g 12   chlorpheniramine-HYDROcodone (TUSSIONEX PENNKINETIC ER) 10-8 MG/5ML SUER Take 5 mLs by mouth every 12 (twelve) hours as needed for cough. 140 mL  0   neomycin-polymyxin-hydrocortisone (CORTISPORIN) OTIC solution Place 3 drops into both ears 4 times daily. 10 mL 0   ondansetron (ZOFRAN) 4 MG tablet Take 1 tablet (4 mg total) by mouth every 8 (eight) hours as needed for nausea or vomiting. 20 tablet 0   ciprofloxacin-hydrocortisone (CIPRO HC) OTIC suspension Place 3 drops into the right ear 2 (two) times daily. 10 mL 0   metoCLOPramide (REGLAN) 5 MG tablet TAKE 1 TABLET BY MOUTH EVERY 12 HOURS AS NEEDED FOR UP TO 2 DOSES FOR NAUSEA. TAKE 30-45 MINUTES BEFORE EVENING AND MORNING DOSES OF BOWEL PREP SOLN 2 tablet 0   No facility-administered medications prior to visit.    ROS Review of Systems  Constitutional:  Negative for diaphoresis, fatigue and unexpected weight change.  HENT: Negative.  Negative for ear pain.   Eyes:  Negative for visual disturbance.  Respiratory:  Negative for cough, chest tightness, shortness of breath and wheezing.   Cardiovascular:  Negative for chest pain, palpitations and leg swelling.  Gastrointestinal:  Positive for abdominal pain and diarrhea. Negative for blood in stool, nausea and vomiting.  Genitourinary: Negative.   Musculoskeletal: Negative.   Neurological: Negative.  Negative for dizziness and weakness.  Hematological:  Negative for adenopathy. Does not bruise/bleed easily.  Psychiatric/Behavioral: Negative.     Objective:  BP 118/82 (BP Location: Left Arm, Patient Position: Sitting, Cuff Size: Large)   Pulse 82   Temp 98 F (36.7 C) (Oral)   Resp 16   Ht 5\' 10"  (1.778 m)   Wt 222 lb (100.7 kg)   SpO2 97%   BMI 31.85 kg/m   Physical Exam Vitals reviewed.  HENT:     Right Ear: Hearing and ear canal normal. There is no impacted cerumen. No PE tube.     Left Ear: Hearing and ear canal normal. There is no impacted cerumen. A PE tube is present.     Mouth/Throat:     Mouth: Mucous membranes are moist.  Eyes:     Conjunctiva/sclera: Conjunctivae normal.  Cardiovascular:     Rate and  Rhythm: Normal rate and regular rhythm.     Heart sounds: No murmur heard. Pulmonary:     Effort: Pulmonary effort is normal.     Breath sounds: No stridor. No wheezing, rhonchi or rales.  Abdominal:     General: Abdomen is protuberant. Bowel sounds are normal. There is no distension.     Palpations: Abdomen is soft. There is no hepatomegaly, splenomegaly or mass.     Tenderness: There is no abdominal tenderness.  Musculoskeletal:        General: Normal range of motion.     Cervical back: Neck supple.     Right lower leg: No edema.     Left lower leg: No edema.  Lymphadenopathy:     Cervical: No cervical adenopathy.  Skin:    General: Skin is warm and dry.  Neurological:     General: No focal deficit present.     Mental Status: She is alert.  Psychiatric:        Mood and Affect: Mood normal.        Behavior:  Behavior normal.    Lab Results  Component Value Date   WBC 7.2 09/25/2020   HGB 14.2 09/25/2020   HCT 43.3 09/25/2020   PLT 225.0 09/25/2020   GLUCOSE 93 09/25/2020   CHOL 201 (H) 02/27/2021   TRIG 89.0 02/27/2021   HDL 53.30 02/27/2021   LDLCALC 129 (H) 02/27/2021   ALT 12 09/25/2020   AST 8 09/25/2020   NA 136 09/25/2020   K 3.7 09/25/2020   CL 101 09/25/2020   CREATININE 0.75 09/25/2020   BUN 9 09/25/2020   CO2 29 09/25/2020   TSH 0.84 09/25/2020     Assessment & Plan:   Paisly was seen today for annual exam, asthma and gastroesophageal reflux.  Diagnoses and all orders for this visit:  Gastroesophageal reflux disease without esophagitis- Will try a more potent PPI. -     dexlansoprazole (DEXILANT) 60 MG capsule; Take 1 capsule (60 mg total) by mouth daily.  Mild intermittent asthma without complication- Her sx's are well controlled.  Encounter for general adult medical examination with abnormal findings- Exam completed, labs reviewed, vaccines reviewed and updated, cancer screenings are UTD, pt ed material was given. -     Lipid panel; Future -      Hepatitis C antibody; Future -     Hepatitis C antibody -     Lipid panel  Need for vaccination -     Pneumococcal conjugate vaccine 20-valent (Prevnar 20)  Need for hepatitis C screening test -     Hepatitis C antibody; Future -     Hepatitis C antibody  I have discontinued Hatsumi Bring's chlorpheniramine-HYDROcodone, metoCLOPramide, ondansetron, Cipro HC, and neomycin-polymyxin-hydrocortisone. I am also having her start on dexlansoprazole. Additionally, I am having her maintain her Phexxi, Hyoscyamine Sulfate SL, budesonide-formoterol, levocetirizine, medroxyPROGESTERone, montelukast, hyoscyamine, Tiotropium Bromide Monohydrate, Hyoscyamine Sulfate SL, omalizumab, SUMAtriptan, aspirin EC, albuterol, and doxycycline.  Meds ordered this encounter  Medications   dexlansoprazole (DEXILANT) 60 MG capsule    Sig: Take 1 capsule (60 mg total) by mouth daily.    Dispense:  90 capsule    Refill:  1      Follow-up: Return in about 6 months (around 08/30/2021).  Scarlette Calico, MD

## 2021-02-27 NOTE — Patient Instructions (Signed)
[employer] Current Therapy 2021 (pp. 213-216). [place], [place]: Elsevier.">  Gastroesophageal Reflux Disease, Adult Gastroesophageal reflux (GER) happens when acid from the stomach flows up into the tube that connects the mouth and the stomach (esophagus). Normally, food travels down the esophagus and stays in the stomach to be digested. However, when a person has GER, food and stomach acid sometimes move back up into the esophagus. If this becomes a more serious problem, the person may be diagnosed with a disease called gastroesophageal reflux disease (GERD). GERD occurs when the reflux: Happens often. Causes frequent or severe symptoms. Causes problems such as damage to the esophagus. When stomach acid comes in contact with the esophagus, the acid may cause inflammation in the esophagus. Over time, GERD may create small holes (ulcers) in the lining of the esophagus. What are the causes? This condition is caused by a problem with the muscle between the esophagus and the stomach (lower esophageal sphincter, or LES). Normally, the LES muscle closes after food passes through the esophagus to the stomach. When the LES is weakened or abnormal, it does not close properly, and that allows food and stomach acid to go back up into theesophagus. The LES can be weakened by certain dietary substances, medicines, and medical conditions, including: Tobacco use. Pregnancy. Having a hiatal hernia. Alcohol use. Certain foods and beverages, such as coffee, chocolate, onions, and peppermint. What increases the risk? You are more likely to develop this condition if you: Have an increased body weight. Have a connective tissue disorder. Take NSAIDs, such as ibuprofen. What are the signs or symptoms? Symptoms of this condition include: Heartburn. Difficult or painful swallowing and the feeling of having a lump in the throat. A bitter taste in the mouth. Bad breath and having a large amount of saliva. Having an  upset or bloated stomach and belching. Chest pain. Different conditions can cause chest pain. Make sure you see your health care provider if you experience chest pain. Shortness of breath or wheezing. Ongoing (chronic) cough or a nighttime cough. Wearing away of tooth enamel. Weight loss. How is this diagnosed? This condition may be diagnosed based on a medical history and a physical exam. To determine if you have mild or severe GERD, your health care provider may also monitor how you respond to treatment. You may also have tests, including: A test to examine your stomach and esophagus with a small camera (endoscopy). A test that measures the acidity level in your esophagus. A test that measures how much pressure is on your esophagus. A barium swallow or modified barium swallow test to show the shape, size, and functioning of your esophagus. How is this treated? Treatment for this condition may vary depending on how severe your symptoms are. Your health care provider may recommend: Changes to your diet. Medicine. Surgery. The goal of treatment is to help relieve your symptoms and to preventcomplications. Follow these instructions at home: Eating and drinking  Follow a diet as recommended by your health care provider. This may involve avoiding foods and drinks such as: Coffee and tea, with or without caffeine. Drinks that contain alcohol. Energy drinks and sports drinks. Carbonated drinks or sodas. Chocolate and cocoa. Peppermint and mint flavorings. Garlic and onions. Horseradish. Spicy and acidic foods, including peppers, chili powder, curry powder, vinegar, hot sauces, and barbecue sauce. Citrus fruit juices and citrus fruits, such as oranges, lemons, and limes. Tomato-based foods, such as red sauce, chili, salsa, and pizza with red sauce. Fried and fatty foods, such as   donuts, french fries, potato chips, and high-fat dressings. High-fat meats, such as hot dogs and fatty cuts of  red and white meats, such as rib eye steak, sausage, ham, and bacon. High-fat dairy items, such as whole milk, butter, and cream cheese. Eat small, frequent meals instead of large meals. Avoid drinking large amounts of liquid with your meals. Avoid eating meals during the 2-3 hours before bedtime. Avoid lying down right after you eat. Do not exercise right after you eat.  Lifestyle  Do not use any products that contain nicotine or tobacco. These products include cigarettes, chewing tobacco, and vaping devices, such as e-cigarettes. If you need help quitting, ask your health care provider. Try to reduce your stress by using methods such as yoga or meditation. If you need help reducing stress, ask your health care provider. If you are overweight, reduce your weight to an amount that is healthy for you. Ask your health care provider for guidance about a safe weight loss goal.  General instructions Pay attention to any changes in your symptoms. Take over-the-counter and prescription medicines only as told by your health care provider. Do not take aspirin, ibuprofen, or other NSAIDs unless your health care provider told you to take these medicines. Wear loose-fitting clothing. Do not wear anything tight around your waist that causes pressure on your abdomen. Raise (elevate) the head of your bed about 6 inches (15 cm). You can use a wedge to do this. Avoid bending over if this makes your symptoms worse. Keep all follow-up visits. This is important. Contact a health care provider if: You have: New symptoms. Unexplained weight loss. Difficulty swallowing or it hurts to swallow. Wheezing or a persistent cough. A hoarse voice. Your symptoms do not improve with treatment. Get help right away if: You have sudden pain in your arms, neck, jaw, teeth, or back. You suddenly feel sweaty, dizzy, or light-headed. You have chest pain or shortness of breath. You vomit and the vomit is green, yellow, or  black, or it looks like blood or coffee grounds. You faint. You have stool that is red, bloody, or black. You cannot swallow, drink, or eat. These symptoms may represent a serious problem that is an emergency. Do not wait to see if the symptoms will go away. Get medical help right away. Call your local emergency services (911 in the [place] Do not drive yourself to the hospital. Summary Gastroesophageal reflux happens when acid from the stomach flows up into the esophagus. GERD is a disease in which the reflux happens often, causes frequent or severe symptoms, or causes problems such as damage to the esophagus. Treatment for this condition may vary depending on how severe your symptoms are. Your health care provider may recommend diet and lifestyle changes, medicine, or surgery. Contact a health care provider if you have new or worsening symptoms. Take over-the-counter and prescription medicines only as told by your health care provider. Do not take aspirin, ibuprofen, or other NSAIDs unless your health care provider told you to do so. Keep all follow-up visits as told by your health care provider. This is important. This information is not intended to replace advice given to you by your health care provider. Make sure you discuss any questions you have with your healthcare provider. Document Revised: 02/13/2020 Document Reviewed: 02/13/2020 Elsevier Patient Education  2022 Elsevier Inc.  

## 2021-02-28 LAB — HEPATITIS C ANTIBODY
Hepatitis C Ab: NONREACTIVE
SIGNAL TO CUT-OFF: 0.01 (ref <1.00)

## 2021-03-01 ENCOUNTER — Telehealth: Payer: Self-pay

## 2021-03-01 ENCOUNTER — Other Ambulatory Visit (HOSPITAL_COMMUNITY): Payer: Self-pay

## 2021-03-01 NOTE — Telephone Encounter (Signed)
Key: Ardeen Jourdain

## 2021-03-01 NOTE — Telephone Encounter (Signed)
-----   Message from Marguarite Arbour sent at 03/01/2021  2:47 PM EDT ----- Cover my meds

## 2021-03-04 NOTE — Telephone Encounter (Signed)
Approved  03/01/2021 to 02/28/2022

## 2021-03-05 ENCOUNTER — Other Ambulatory Visit: Payer: Self-pay | Admitting: Internal Medicine

## 2021-03-05 ENCOUNTER — Other Ambulatory Visit (HOSPITAL_COMMUNITY): Payer: Self-pay

## 2021-03-05 MED ORDER — NEOMYCIN-POLYMYXIN-HC 3.5-10000-1 OT SOLN
3.0000 [drp] | Freq: Four times a day (QID) | OTIC | 0 refills | Status: DC
  Filled 2021-03-05: qty 10, 8d supply, fill #0

## 2021-03-12 ENCOUNTER — Other Ambulatory Visit: Payer: Self-pay | Admitting: Emergency Medicine

## 2021-03-12 ENCOUNTER — Other Ambulatory Visit (HOSPITAL_COMMUNITY): Payer: Self-pay

## 2021-03-12 MED ORDER — MONTELUKAST SODIUM 10 MG PO TABS
ORAL_TABLET | Freq: Every day | ORAL | 10 refills | Status: DC
  Filled 2021-03-12: qty 30, 30d supply, fill #0
  Filled 2021-04-09: qty 30, 30d supply, fill #1
  Filled 2021-05-14: qty 30, 30d supply, fill #2
  Filled 2021-06-11: qty 30, 30d supply, fill #3
  Filled 2021-07-14: qty 30, 30d supply, fill #4
  Filled 2021-08-19: qty 30, 30d supply, fill #5
  Filled 2021-09-22: qty 30, 30d supply, fill #6
  Filled 2021-10-25: qty 30, 30d supply, fill #7
  Filled 2021-12-16: qty 30, 30d supply, fill #8
  Filled 2022-01-14: qty 30, 30d supply, fill #9
  Filled 2022-02-20: qty 30, 30d supply, fill #10

## 2021-03-12 MED ORDER — LEVOCETIRIZINE DIHYDROCHLORIDE 5 MG PO TABS
ORAL_TABLET | Freq: Every evening | ORAL | 10 refills | Status: DC
  Filled 2021-03-12: qty 30, 30d supply, fill #0
  Filled 2021-04-09: qty 30, 30d supply, fill #1
  Filled 2021-05-14: qty 30, 30d supply, fill #2
  Filled 2021-06-11: qty 30, 30d supply, fill #3
  Filled 2021-07-14: qty 30, 30d supply, fill #4
  Filled 2021-08-19: qty 30, 30d supply, fill #5
  Filled 2021-09-22: qty 30, 30d supply, fill #6
  Filled 2021-10-25: qty 30, 30d supply, fill #7
  Filled 2021-12-16: qty 30, 30d supply, fill #8
  Filled 2022-01-14: qty 30, 30d supply, fill #9
  Filled 2022-02-20: qty 30, 30d supply, fill #10

## 2021-03-15 ENCOUNTER — Other Ambulatory Visit (HOSPITAL_COMMUNITY): Payer: Self-pay

## 2021-03-18 ENCOUNTER — Other Ambulatory Visit (HOSPITAL_COMMUNITY): Payer: Self-pay

## 2021-03-18 ENCOUNTER — Telehealth: Payer: 59 | Admitting: Nurse Practitioner

## 2021-03-18 DIAGNOSIS — J069 Acute upper respiratory infection, unspecified: Secondary | ICD-10-CM

## 2021-03-18 MED ORDER — BENZONATATE 200 MG PO CAPS
200.0000 mg | ORAL_CAPSULE | Freq: Three times a day (TID) | ORAL | 0 refills | Status: DC | PRN
  Filled 2021-03-18: qty 30, 10d supply, fill #0

## 2021-03-18 MED ORDER — PREDNISONE 10 MG (21) PO TBPK
ORAL_TABLET | ORAL | 0 refills | Status: DC
  Filled 2021-03-18: qty 21, 6d supply, fill #0

## 2021-03-18 NOTE — Progress Notes (Signed)
We are sorry that you are not feeling well.  Here is how we plan to help!  Based on your presentation I believe you most likely have A cough due to a virus.  This is called viral bronchitis and is best treated by rest, plenty of fluids and control of the cough.  You may use Ibuprofen or Tylenol as directed to help your symptoms.     In addition you may use A prescription cough medication called Tessalon Perles '100mg'$ . You may take 1-2 capsules every 8 hours as needed for your cough.  Prednisone 10 mg daily for 6 days (see taper instructions below)  You should continue to use your rescue inhaler as prescribed as well.   USE OF BRONCHODILATOR ("RESCUE") INHALERS: There is a risk from using your bronchodilator too frequently.  The risk is that over-reliance on a medication which only relaxes the muscles surrounding the breathing tubes can reduce the effectiveness of medications prescribed to reduce swelling and congestion of the tubes themselves.  Although you feel brief relief from the bronchodilator inhaler, your asthma may actually be worsening with the tubes becoming more swollen and filled with mucus.  This can delay other crucial treatments, such as oral steroid medications. If you need to use a bronchodilator inhaler daily, several times per day, you should discuss this with your provider.  There are probably better treatments that could be used to keep your asthma under control.     HOME CARE Only take medications as instructed by your medical team. Complete the entire course of an antibiotic. Drink plenty of fluids and get plenty of rest. Avoid close contacts especially the very young and the elderly Cover your mouth if you cough or cough into your sleeve. Always remember to wash your hands A steam or ultrasonic humidifier can help congestion.   GET HELP RIGHT AWAY IF: You develop worsening fever. You become short of breath You cough up blood. Your symptoms persist after you have  completed your treatment plan MAKE SURE YOU  Understand these instructions. Will watch your condition. Will get help right away if you are not doing well or get worse. Meds ordered this encounter  Medications   benzonatate (TESSALON) 200 MG capsule    Sig: Take 1 capsule (200 mg total) by mouth 3 (three) times daily as needed for cough.    Dispense:  30 capsule    Refill:  0   predniSONE (STERAPRED UNI-PAK 21 TAB) 10 MG (21) TBPK tablet    Sig: Take 6 tablets on day one, 5 on day two, 4 on day three, 3 on day four, 2 on day five, and 1 on day six. Take with food.    Dispense:  21 tablet    Refill:  0    These medications have been called into [facility]     Thank you for choosing an e-visit.  Your e-visit answers were reviewed by a board certified advanced clinical practitioner to complete your personal care plan. Depending upon the condition, your plan could have included both over the counter or prescription medications.  Please review your pharmacy choice. Make sure the pharmacy is open so you can pick up prescription now. If there is a problem, you may contact your provider through [employer] and have the prescription routed to another pharmacy.  Your safety is important to [place]. If you have drug allergies check your prescription carefully.   For the next 24 hours you can use MyChart to ask  questions about today's visit, request a non-urgent call back, or ask for a work or school excuse. You will get an email in the next two days asking about your experience. I hope that your e-visit has been valuable and will speed your recovery.   I spent approximately 10 minutes reviewing the patient's history, current symptoms and coordinating their care today.

## 2021-03-19 ENCOUNTER — Other Ambulatory Visit (HOSPITAL_COMMUNITY): Payer: Self-pay

## 2021-03-19 MED FILL — Omalizumab Subcutaneous Soln Prefilled Syringe 150 MG/ML: SUBCUTANEOUS | 28 days supply | Qty: 2 | Fill #4 | Status: AC

## 2021-03-22 ENCOUNTER — Other Ambulatory Visit (HOSPITAL_COMMUNITY): Payer: Self-pay

## 2021-03-22 DIAGNOSIS — L718 Other rosacea: Secondary | ICD-10-CM | POA: Diagnosis not present

## 2021-03-22 MED ORDER — METRONIDAZOLE 0.75 % EX CREA
TOPICAL_CREAM | CUTANEOUS | 12 refills | Status: DC
  Filled 2021-03-22: qty 45, 30d supply, fill #0
  Filled 2021-07-14: qty 45, 30d supply, fill #1

## 2021-03-25 ENCOUNTER — Other Ambulatory Visit (HOSPITAL_COMMUNITY): Payer: Self-pay

## 2021-03-26 ENCOUNTER — Other Ambulatory Visit (HOSPITAL_COMMUNITY): Payer: Self-pay

## 2021-04-01 ENCOUNTER — Other Ambulatory Visit (HOSPITAL_COMMUNITY): Payer: Self-pay

## 2021-04-01 MED FILL — Omalizumab Subcutaneous Soln Prefilled Syringe 150 MG/ML: SUBCUTANEOUS | 28 days supply | Qty: 2 | Fill #5 | Status: CN

## 2021-04-04 ENCOUNTER — Telehealth: Payer: Self-pay | Admitting: Emergency Medicine

## 2021-04-04 ENCOUNTER — Other Ambulatory Visit (HOSPITAL_COMMUNITY): Payer: Self-pay

## 2021-04-04 MED ORDER — PREDNISONE 10 MG PO TABS
ORAL_TABLET | ORAL | 0 refills | Status: DC
  Filled 2021-04-04: qty 20, 8d supply, fill #0

## 2021-04-04 MED ORDER — NIRMATRELVIR&RITONAVIR 300/100 20 X 150 MG & 10 X 100MG PO TBPK
2.0000 | ORAL_TABLET | Freq: Two times a day (BID) | ORAL | 0 refills | Status: AC
  Filled 2021-04-04: qty 30, 5d supply, fill #0

## 2021-04-04 NOTE — Addendum Note (Signed)
Addended by: Gavin Potters R on: 04/04/2021 12:35 PM   Modules accepted: Orders

## 2021-04-04 NOTE — Telephone Encounter (Signed)
Ok prednisone 10 mg, # 20, 4 X 2 DAYS, 3 X 2 DAYS, 2 X 2 DAYS, 1 X 2 DAYS  Also suggest Paxlovid (bid x 5 day course)  Verify she is not pregnant

## 2021-04-04 NOTE — Telephone Encounter (Signed)
Spoke with pt and reviewed Dr. Janee Morn recommendations. Pt stated understanding or recommendations and medications. Pt's pharmacy verified as [facility] outpatient pharmacy. Medication orders were placed. Pt instructed if symptoms get any worse to seek medical evaluation at [facility] or ER. Nothing further needed at this time.

## 2021-04-04 NOTE — Telephone Encounter (Signed)
Call made to patient, confirmed DOB. She reports yesterday she developed a headache, sore throat, congestion, lost her smell, and a dry cough. Reports her chest feels really tight. She confirms using Symbicort and Spiriva daily and albuterol 5x/day since yesterday. Current fever of 101. She has taken IBUpofren. She used a home Covid test late yesterday and thought she would be okay but the increased SOB has her concerned. Both her children are also positive as well. Requesting prednisone. She wanted to be sure we were aware she gets xolair injections monthly.   CY please advise. Thanks :)

## 2021-04-09 ENCOUNTER — Other Ambulatory Visit (HOSPITAL_COMMUNITY): Payer: Self-pay

## 2021-04-09 MED FILL — Budesonide-Formoterol Fumarate Dihyd Aerosol 160-4.5 MCG/ACT: RESPIRATORY_TRACT | 30 days supply | Qty: 10.2 | Fill #3 | Status: AC

## 2021-04-09 MED FILL — Tiotropium Bromide Monohydrate Inhal Aerosol 1.25 MCG/ACT: RESPIRATORY_TRACT | 30 days supply | Qty: 4 | Fill #4 | Status: AC

## 2021-04-12 ENCOUNTER — Other Ambulatory Visit (HOSPITAL_COMMUNITY): Payer: Self-pay

## 2021-04-12 MED FILL — Omalizumab Subcutaneous Soln Prefilled Syringe 150 MG/ML: SUBCUTANEOUS | 28 days supply | Qty: 2 | Fill #5 | Status: CN

## 2021-04-12 MED FILL — Omalizumab Subcutaneous Soln Prefilled Syringe 150 MG/ML: SUBCUTANEOUS | 28 days supply | Qty: 2 | Fill #5 | Status: AC

## 2021-04-15 ENCOUNTER — Other Ambulatory Visit (HOSPITAL_COMMUNITY): Payer: Self-pay

## 2021-04-15 MED ORDER — AZELAIC ACID 15 % EX GEL
CUTANEOUS | 99 refills | Status: DC
  Filled 2021-04-15: qty 50, 25d supply, fill #0
  Filled 2021-07-14: qty 50, 25d supply, fill #1

## 2021-04-19 ENCOUNTER — Encounter: Payer: Self-pay | Admitting: Internal Medicine

## 2021-04-22 ENCOUNTER — Telehealth: Payer: Self-pay | Admitting: Nurse Practitioner

## 2021-04-22 DIAGNOSIS — L719 Rosacea, unspecified: Secondary | ICD-10-CM

## 2021-04-22 NOTE — Progress Notes (Signed)
Tammy Chapman,  We are sorry to hear that the rosacea has returned. Unfortunately we have exhausted treatment options for this that we can offer through telehealth appointments.   Beyond the oral antibiotic that we prescribed last time, and the cream that you have also tried, the next step would be to see a dermatologist.   You can also touch base with your primary care tomorrow to see if they would prefer to see you or help you with a referral to dermatology.   I apologize for the inconvenience. If you need anything else please let us know.  You will not be charged for this visit.   Thank you Tammy Schneiders FNP-C

## 2021-04-29 ENCOUNTER — Other Ambulatory Visit (HOSPITAL_COMMUNITY): Payer: Self-pay

## 2021-04-29 MED FILL — Omalizumab Subcutaneous Soln Prefilled Syringe 150 MG/ML: SUBCUTANEOUS | 28 days supply | Qty: 2 | Fill #6 | Status: AC

## 2021-05-01 ENCOUNTER — Ambulatory Visit: Payer: Self-pay | Admitting: Internal Medicine

## 2021-05-07 ENCOUNTER — Other Ambulatory Visit (HOSPITAL_COMMUNITY): Payer: Self-pay

## 2021-05-10 ENCOUNTER — Other Ambulatory Visit (HOSPITAL_COMMUNITY): Payer: Self-pay

## 2021-05-14 ENCOUNTER — Other Ambulatory Visit: Payer: Self-pay | Admitting: Emergency Medicine

## 2021-05-14 ENCOUNTER — Other Ambulatory Visit (HOSPITAL_COMMUNITY): Payer: Self-pay

## 2021-05-14 DIAGNOSIS — G43919 Migraine, unspecified, intractable, without status migrainosus: Secondary | ICD-10-CM

## 2021-05-15 ENCOUNTER — Other Ambulatory Visit (HOSPITAL_COMMUNITY): Payer: Self-pay

## 2021-05-15 MED FILL — Tiotropium Bromide Monohydrate Inhal Aerosol 1.25 MCG/ACT: RESPIRATORY_TRACT | 30 days supply | Qty: 4 | Fill #5 | Status: AC

## 2021-05-16 ENCOUNTER — Other Ambulatory Visit (HOSPITAL_COMMUNITY): Payer: Self-pay

## 2021-05-17 ENCOUNTER — Other Ambulatory Visit (HOSPITAL_COMMUNITY): Payer: Self-pay

## 2021-05-27 ENCOUNTER — Other Ambulatory Visit (HOSPITAL_COMMUNITY): Payer: Self-pay

## 2021-05-28 ENCOUNTER — Ambulatory Visit: Payer: Self-pay | Admitting: Internal Medicine

## 2021-05-30 ENCOUNTER — Other Ambulatory Visit (HOSPITAL_COMMUNITY): Payer: Self-pay

## 2021-05-30 MED FILL — Omalizumab Subcutaneous Soln Prefilled Syringe 150 MG/ML: SUBCUTANEOUS | 28 days supply | Qty: 2 | Fill #7 | Status: AC

## 2021-06-06 ENCOUNTER — Other Ambulatory Visit (HOSPITAL_COMMUNITY): Payer: Self-pay

## 2021-06-06 ENCOUNTER — Other Ambulatory Visit: Payer: Self-pay | Admitting: Gastroenterology

## 2021-06-06 DIAGNOSIS — K219 Gastro-esophageal reflux disease without esophagitis: Secondary | ICD-10-CM

## 2021-06-06 MED ORDER — SUCRALFATE 1 G PO TABS
1.0000 g | ORAL_TABLET | Freq: Three times a day (TID) | ORAL | 1 refills | Status: DC
  Filled 2021-06-06: qty 42, 14d supply, fill #0

## 2021-06-11 MED FILL — Tiotropium Bromide Monohydrate Inhal Aerosol 1.25 MCG/ACT: RESPIRATORY_TRACT | 30 days supply | Qty: 4 | Fill #6 | Status: AC

## 2021-06-11 MED FILL — Budesonide-Formoterol Fumarate Dihyd Aerosol 160-4.5 MCG/ACT: RESPIRATORY_TRACT | 30 days supply | Qty: 10.2 | Fill #4 | Status: AC

## 2021-06-12 ENCOUNTER — Other Ambulatory Visit (HOSPITAL_COMMUNITY): Payer: Self-pay

## 2021-06-13 ENCOUNTER — Telehealth: Payer: Self-pay | Admitting: Emergency Medicine

## 2021-06-13 ENCOUNTER — Other Ambulatory Visit (HOSPITAL_COMMUNITY): Payer: Self-pay

## 2021-06-13 MED ORDER — PREDNISONE 10 MG PO TABS
ORAL_TABLET | ORAL | 0 refills | Status: AC
  Filled 2021-06-13: qty 30, 12d supply, fill #0

## 2021-06-13 MED ORDER — DOXYCYCLINE HYCLATE 100 MG PO TABS
100.0000 mg | ORAL_TABLET | Freq: Two times a day (BID) | ORAL | 0 refills | Status: DC
  Filled 2021-06-13: qty 14, 7d supply, fill #0

## 2021-06-13 NOTE — Telephone Encounter (Signed)
Lm x1 for patient.  

## 2021-06-13 NOTE — Telephone Encounter (Signed)
Patient is returning phone call. Patient phone number is [phone].

## 2021-06-13 NOTE — Telephone Encounter (Signed)
I called the patient and let her know what Dr. Brock Ra recommended and she voices understanding. Nothing further needed.

## 2021-06-13 NOTE — Telephone Encounter (Signed)
I am having an exacerbation of asthma symptoms. Symptoms include dry cough, runny nose, tightness in my chest with a slight wheeze and shortness of breath. I noticed it Sunday night. I took my medication as directed. By Tuesday I had to pull out my rescue inhaler and have used it PRN since. My grandma is actively dying so I am now in her house quite a bit and it is very old and it does bother my asthma to be in there for too long, but considering the circumstances I have probably been staying much longer than I should for my asthma. I am also around several family members who smoke and that does tend to flair it. I would love it if you would be willing to send me in some prednisone or something to help get me through this.

## 2021-06-13 NOTE — Telephone Encounter (Signed)
Spoke with the pt  She is c/o wheezing, cough and SOB since exposed to dust and smoke approx 1 wk ago  She has been using her albuterol without much help  She feels the symptoms are a direct result of exposure to smoke and dust  She can not avoid this environment right now due to family gathering together as her grandma is dying actively  She feels needs pred and possible abx bc she is coughing up yellow sputum

## 2021-06-13 NOTE — Telephone Encounter (Signed)
Pred > Take 40mg  daily for 3 days, then 30mg  daily for 3 days, then 20mg  daily for 3 days, then 10mg  daily for 3 days, then stop Doxycycline 100mg  bid x 7 days Call if not improving

## 2021-06-28 ENCOUNTER — Ambulatory Visit (INDEPENDENT_AMBULATORY_CARE_PROVIDER_SITE_OTHER): Payer: 59 | Admitting: Gastroenterology

## 2021-06-28 ENCOUNTER — Encounter: Payer: Self-pay | Admitting: Gastroenterology

## 2021-06-28 VITALS — BP 116/70 | HR 94 | Ht 70.0 in | Wt 219.0 lb

## 2021-06-28 DIAGNOSIS — R1319 Other dysphagia: Secondary | ICD-10-CM

## 2021-06-28 DIAGNOSIS — R12 Heartburn: Secondary | ICD-10-CM

## 2021-06-28 NOTE — Progress Notes (Signed)
[facility] Progress Note  Chief Complaint: Heartburn and dysphagia  Subjective  History: From my 12/10/2020 office note: "I believe she has longstanding IBS-D.  The pain is a prominent symptom, so does not surprise that antispasmodic therapy is not helped much.  We cannot use Viberzi since she has had cholecystectomy (and Lotronex can be challenging to get with the restrictions on a). We discussed a trial of TCA or duloxetine, even though she does not have a concomitant mood disorder.  Discussed the rationale for use of these medicines for various chronic pain syndromes including IBS.   Lastly, I recommended a CT enterography to be sure she does not have more proximal small bowel Crohn's disease.   Plan: CT enterography   Amitriptyline 25 mg nightly.  Trial at this dose for least 2 weeks to make sure it is tolerated before considering increasing dose.   Discontinue hyoscyamine (or dicyclomine)"  _________________________________  Recent portal message from patient: "I think I'm having esophageal spasms again. I had a lot of issues with this as a teenager when I would see Dr, Laural Golden. That was one of the reasons we did an endoscopy when I was about 19. I am now having the same constant feeling of heartburn, even though I am on 1 60mg  Dexalant pill a day for it. I think I might need 2. I also am having these attacks of pretty severe pain that is causing me to throw up, sweat and just generally have a hard time swallowing during them. It hurts in my chest so bad that it feels like it's going through to my back. The pain is so severe it actually makes it hard to talk. My latest attack of this was last night, and it lasted around an hour to an hour and a half. It's actually wanting to hurt some now. I don't feel any numbness, tingling or pain in my arm or jaw so I really do not feel this is cardiovascular. It just feels like it did many years ago. Actually at one point I had an EKG during  one of these attacks at an urgent care and it was fine. I'm not sure if there's anything we can do for this now or not? What do you suggest? "  I recommended continuing same dose of Dexilant, adding a trial of Carafate and an office visit with me _______________________  Nunzio Cobbs is lately been experiencing similar heartburn to before and takes Dexilant once daily, which she has done intermittently (or other PPIs) for many years.  However, she has had increased episodes of nonexertional chest tightness, often after meals.  She feels it is reminiscent of the "esophageal spasms" she had experienced when previously under Dr. Olevia Perches care.  She sometimes feels food or pills hung up in the chest.  Liquids passed without difficulty, no nausea vomiting or weight loss. Still some intermittent right lower quadrant pain and irregular bowel habits.  She is wondering if her UGI symptoms may coincide with some recently increased stressors.  ROS: Cardiovascular:  no chest pain Respiratory: no dyspnea Remainder systems negative except as above The patient's Past Medical, Family and Social History were reviewed and are on file in the EMR.  Objective:  Med list reviewed  Current Outpatient Medications:    albuterol (VENTOLIN HFA) 108 (90 Base) MCG/ACT inhaler, Inhale 2 puffs into the lungs every 6 (six) hours as needed for wheezing or shortness of breath., Disp: 18 g, Rfl: 2   aspirin EC  81 MG tablet, Take 81 mg by mouth daily. Swallow whole., Disp: , Rfl:    Azelaic Acid (FINACEA) 15 % gel, Apply topically to face up to twice daily,, Disp: 50 g, Rfl: PRN   benzonatate (TESSALON) 200 MG capsule, Take 1 capsule by mouth 3 times daily as needed for cough., Disp: 30 capsule, Rfl: 0   budesonide-formoterol (SYMBICORT) 160-4.5 MCG/ACT inhaler, INHALE 2 PUFFS BY MOUTH 2 TIMES DAILY, Disp: 10.2 g, Rfl: 11   dexlansoprazole (DEXILANT) 60 MG capsule, Take 1 capsule (60 mg total) by mouth daily., Disp: 90 capsule, Rfl:  1   hyoscyamine (LEVBID) 0.375 MG 12 hr tablet, TAKE 1 TABLET BY MOUTH 2 TIMES DAILY, Disp: 60 tablet, Rfl: 1   Hyoscyamine Sulfate SL 0.125 MG SUBL, PLACE 1 TABLET UNDER THE TONGUE EVERY 6 HOURS AS NEEDED, Disp: 30 tablet, Rfl: 1   levocetirizine (XYZAL) 5 MG tablet, TAKE 1 TABLET BY MOUTH EVERY EVENING, Disp: 30 tablet, Rfl: 10   metroNIDAZOLE (METROCREAM) 0.75 % cream, Apply topically to face up to 2 times a day, Disp: 45 g, Rfl: 12   montelukast (SINGULAIR) 10 MG tablet, TAKE 1 TABLET BY MOUTH AT BEDTIME, Disp: 30 tablet, Rfl: 10   neomycin-polymyxin-hydrocortisone (CORTISPORIN) OTIC solution, Place 3 drops into both ears 4 times daily., Disp: 10 mL, Rfl: 0   omalizumab (XOLAIR) 150 MG/ML prefilled syringe, INJECT 300 MG INTO THE SKIN EVERY 28 DAYS., Disp: 2 mL, Rfl: 11   PHEXXI 1.8-1-0.4 % GEL, PLACE 1 APPLICATORFUL VAGINALLY AS NEEDED. IMMEDIATELY BEFORE OR UP TO 1 HOUR BEFORE SEX, Disp: 60 g, Rfl: 11   sucralfate (CARAFATE) 1 g tablet, Take 1 tablet (1 g total) by mouth 3 (three) times daily. Dissolve tablet in 30cc water to make a slurry, Disp: 42 tablet, Rfl: 1   SUMAtriptan (IMITREX) 100 MG tablet, Take 1 tablet (100 mg total) by mouth every 2 (two) hours as needed for migraine. May repeat in 2 hours if headache persists or recurs., Disp: 30 tablet, Rfl: 0   Tiotropium Bromide Monohydrate 1.25 MCG/ACT AERS, INHALE 2 PUFFS INTO THE LUNGS DAILY., Disp: 4 g, Rfl: 12   medroxyPROGESTERone (PROVERA) 10 MG tablet, TAKE 1 TABLET BY MOUTH ONCE A DAY FOR 2 WEEKS, REPEAT AS DIRECTED, Disp: 14 tablet, Rfl: 3   Vital signs in last 24 hrs: Vitals:   06/28/21 0833  BP: 116/70  Pulse: 94  SpO2: 95%   Wt Readings from Last 3 Encounters:  06/28/21 219 lb (99.3 kg)  02/27/21 222 lb (100.7 kg)  01/31/21 220 lb (99.8 kg)    Physical Exam  Well-appearing, normal vocal quality HEENT: sclera anicteric, oral mucosa moist without lesions Neck: supple, no thyromegaly, JVD or lymphadenopathy Cardiac:  RRR without murmurs, S1S2 heard, no peripheral edema Pulm: clear to auscultation bilaterally, normal RR and effort noted Abdomen: soft, no tenderness, with active bowel sounds. No guarding or palpable hepatosplenomegaly. Skin; warm and dry, no jaundice or rash  Labs:   ___________________________________________ Radiologic studies:  CLINICAL DATA:  Abdominal pain.  Chronic diarrhea.   EXAM: CT ABDOMEN AND PELVIS WITH CONTRAST (ENTEROGRAPHY)   TECHNIQUE: Multidetector CT of the abdomen and pelvis during bolus administration of intravenous contrast. Negative oral contrast was given.   CONTRAST:  134mL OMNIPAQUE IOHEXOL 300 MG/ML  SOLN   COMPARISON:  CT scan 03/12/2009   FINDINGS: Lower chest:  Unremarkable   Hepatobiliary: Focal steatosis in segment 4 adjacent to the falciform ligament. Cholecystectomy.   Pancreas: Unremarkable   Spleen:  Unremarkable   Adrenals/Urinary Tract: Unremarkable   Stomach/Bowel: Mild prominence of stool throughout most of the colon, compatible with constipation. Normal appendix.   Normal small bowel caliber. Normal appearing jejunal folds. The terminal ileum appears empty/collapsed but otherwise unremarkable for example on 34 series 7. I do not see definite accentuated mucosal enhancement or wall thickening in this region, nor are there surrounding inflammatory findings in the adipose tissue.   Vascular/Lymphatic: Unremarkable   Reproductive: The right ovary is partially obscured by adjacent loops of bowel. Rim enhancing 2.1 cm lesion in the right ovary favors a corpus luteum. Uterus unremarkable.   Other: No supplemental non-categorized findings.   Musculoskeletal: Left paracentral disc protrusion at the L3-4 level, without definite impingement.   IMPRESSION: 1. No findings of active Crohn's disease. 2. Prominence of stool in the colon, suggesting constipation. 3. Probable corpus luteum in the right ovary.  No free pelvic  fluid. 4. Left paracentral disc protrusion at L3-4, without observed impingement.     Electronically Signed   By: Van Clines M.D.   On: 12/17/2020 16:19  ____________________________________________ Other:   _____________________________________________ Assessment & Plan  Assessment: Encounter Diagnoses  Name Primary?   Esophageal dysphagia Yes   Heartburn    She does seem to be having symptoms consistent with esophageal spasm.  What is less clear is how much underlying reflux she truly has.  May have esophageal motility disorder   Plan: Upper endoscopy with possible placement of Bravo pH device (if no active esophagitis seen).  Off Dexilant 5 days prior to procedure.  Okay to take Carafate during that time except not day of procedure. She was agreeable after discussion of procedure and risks.  The benefits and risks of the planned procedure were described in detail with the patient or (when appropriate) their health care proxy.  Risks were outlined as including, but not limited to, bleeding, infection, perforation, adverse medication reaction leading to cardiac or pulmonary decompensation, pancreatitis (if ERCP).  The limitation of incomplete mucosal visualization was also discussed.  No guarantees or warranties were given.  If little or no demonstrable reflux on a 48-hour pH study, then PPI is not likely to be helpful for her.  Unfortunately, amitriptyline did not agree with her ("it made me evil").    Nelida Meuse III

## 2021-06-28 NOTE — Patient Instructions (Signed)
If you are age 29 or older, your body mass index should be between 23-30. Your Body mass index is 31.42 kg/m. If this is out of the aforementioned range listed, please consider follow up with your Primary Care Provider.  If you are age 22 or younger, your body mass index should be between 19-25. Your Body mass index is 31.42 kg/m. If this is out of the aformentioned range listed, please consider follow up with your Primary Care Provider.   ________________________________________________________  The [facility] providers would like to encourage you to use [facility] to communicate with providers for non-urgent requests or questions.  Due to long hold times on the telephone, sending your provider a message by [facility] may be a faster and more efficient way to get a response.  Please allow 48 business hours for a response.  Please remember that this is for non-urgent requests.  _______________________________________________________  Tammy Chapman have been scheduled for an endoscopy. Please follow written instructions given to you at your visit today. If you use inhalers (even only as needed), please bring them with you on the day of your procedure.  It was a pleasure to see you today!  Thank you for trusting me with your gastrointestinal care!

## 2021-07-02 ENCOUNTER — Other Ambulatory Visit (HOSPITAL_COMMUNITY): Payer: Self-pay

## 2021-07-03 ENCOUNTER — Ambulatory Visit
Admission: EM | Admit: 2021-07-03 | Discharge: 2021-07-03 | Disposition: A | Discharge status: Home / Self Care | Payer: 59 | Attending: Family Medicine | Admitting: Family Medicine

## 2021-07-03 ENCOUNTER — Other Ambulatory Visit: Payer: Self-pay

## 2021-07-03 DIAGNOSIS — J111 Influenza due to unidentified influenza virus with other respiratory manifestations: Secondary | ICD-10-CM

## 2021-07-03 MED ORDER — PROMETHAZINE-DM 6.25-15 MG/5ML PO SYRP: 5.0000 mL | ORAL_SOLUTION | Freq: Four times a day (QID) | ORAL | 0 refills | Status: DC | PRN

## 2021-07-03 NOTE — ED Triage Notes (Signed)
Symptoms started Monday. Her children have flu B. She needs a work note.

## 2021-07-03 NOTE — ED Provider Notes (Signed)
[place]   [id number] 07/03/21 Arrival Time: 1024  ASSESSMENT & PLAN:  1. Influenza    Discussed typical duration of viral illnesses. OTC symptom care as needed.  Meds ordered this encounter  Medications   promethazine-dextromethorphan (PROMETHAZINE-DM) 6.25-15 MG/5ML syrup    Sig: Take 5 mLs by mouth 4 (four) times daily as needed for cough.    Dispense:  118 mL    Refill:  0     Follow-up Information     Janith Lima, MD.   Specialty: Internal Medicine Why: As needed. Contact information: [place] [address] [phone]                 Reviewed expectations re: course of current medical issues. Questions answered. Outlined signs and symptoms indicating need for more acute intervention. Understanding verbalized. After Visit Summary given.   SUBJECTIVE: History from: patient. Tammy Chapman is a 30 y.o. female who reports: cough, body aches, fatigue, congestion; x 2 days; children with + influenza. Denies: difficulty breathing. Normal PO intake without n/v/d.   OBJECTIVE:  Vitals:   07/03/21 1145  BP: (!) 128/92  Pulse: 90  Resp: 16  Temp: 98.4 F (36.9 C)  TempSrc: Oral  SpO2: 96%    General appearance: alert; no distress Eyes: PERRLA; EOMI; conjunctiva normal HENT: [place]; AT; with nasal congestion Neck: supple  Lungs: speaks full sentences without difficulty; unlabored Extremities: no edema Skin: warm and dry Neurologic: normal gait Psychological: alert and cooperative; normal mood and affect   Allergies  Allergen Reactions   Benadryl [Diphenhydramine Hcl] Shortness Of Breath    Pt states that she feels fine if she sits down, but if she is moving around after taking benadryl, she feels SOB   Penicillins Shortness Of Breath    Has patient had a PCN reaction causing immediate rash, facial/tongue/throat swelling, SOB or lightheadedness with hypotension: yes Has patient had a PCN reaction causing severe  rash involving mucus membranes or skin necrosis: no Has patient had a PCN reaction that required hospitalization: no Has patient had a PCN reaction occurring within the last 10 years: no If all of the above answers are "NO", then may proceed with Cephalosporin use. Patient reports she CAN TAKE kEFLEX AND SUPRAX   Adhesive [Tape] Hives   Percocet [Oxycodone-Acetaminophen]    Lactose Intolerance (Gi) Diarrhea and Nausea And Vomiting   Latex Rash   Omnipaque [Iohexol] Itching    Slight itching in mouth and tongue. No swelling, wheezing, or hives.   Sulfa Antibiotics Rash   Ultram [Tramadol Hcl] Rash    Past Medical History:  Diagnosis Date   Asthma    Bronchitis, mucopurulent recurrent ([place]) 01/19/2019   Chronic asthma 01/19/2019   Loose body of right knee 02/2013   Pregnancy induced hypertension    Recurrent otitis media of both ears 01/19/2019   Sprain and strain of medial collateral ligament of knee 02/2013   right   Social History   Socioeconomic History   Marital status: Married    Spouse name: Not on file   Number of children: 1   Years of education: Not on file   Highest education level: Not on file  Occupational History   Not on file  Tobacco Use   Smoking status: Never   Smokeless tobacco: Never  Vaping Use   Vaping Use: Never used  Substance and Sexual Activity   Alcohol use: No   Drug use: No   Sexual activity: Not Currently  Partners: Male    Birth control/protection: None  Other Topics Concern   Not on file  Social History Narrative   Not on file   Social Determinants of Health   Financial Resource Strain: Not on file  Food Insecurity: Not on file  Transportation Needs: Not on file  Physical Activity: Not on file  Stress: Not on file  Social Connections: Not on file  Intimate Partner Violence: Not on file   Family History  Problem Relation Age of Onset   Diabetes Mother    Hypertension Father    Celiac disease Father    Ulcerative colitis  Maternal Grandmother    Heart disease Maternal Grandmother    Diabetes Maternal Grandfather    COPD Maternal Grandfather    Congestive Heart Failure Maternal Grandfather    Kidney disease Maternal Grandfather    Heart disease Paternal Grandmother    Renal cancer Paternal Grandfather    Heart disease Paternal Grandfather    Other Daughter        milk and soy allergy   Kidney disease Maternal Uncle    Colon cancer Neg Hx    Esophageal cancer Neg Hx    Stomach cancer Neg Hx    Past Surgical History:  Procedure Laterality Date   CESAREAN SECTION N/A 01/08/2017   Procedure: CESAREAN SECTION;  Surgeon: Jonnie Kind, MD;  Location: [facility];  Service: Obstetrics;  Laterality: N/A;   CESAREAN SECTION N/A 07/01/2019   Procedure: REPEAT CESAREAN SECTION;  Surgeon: Jonnie Kind, MD;  Location: [facility];  Service: Obstetrics;  Laterality: N/A;   CHOLECYSTECTOMY  01/20/2008   EAR TUBE REMOVAL  2002   ESOPHAGOGASTRODUODENOSCOPY  05/15/2011   Procedure: ESOPHAGOGASTRODUODENOSCOPY (EGD);  Surgeon: Rogene Houston, MD;  [facility];  Service: Endoscopy;  Laterality: N/A;  3:15    KNEE ARTHROSCOPY Right 03/15/2013   Procedure: RIGHT  KNEE ARTHROSCOPY, FEMORAL PATELLA REEFING, LATERAL RELEASE, PARTIAL LATERAL MENISCECTOMY ;  Surgeon: Lorn Junes, MD;  Location: [facility];  Service: Orthopedics;  Laterality: Right;   [place], 2011     Vanessa Kick, MD 07/03/21 1234

## 2021-07-04 ENCOUNTER — Other Ambulatory Visit (HOSPITAL_COMMUNITY): Payer: Self-pay

## 2021-07-04 MED FILL — Omalizumab Subcutaneous Soln Prefilled Syringe 150 MG/ML: SUBCUTANEOUS | 28 days supply | Qty: 2 | Fill #8 | Status: AC

## 2021-07-14 ENCOUNTER — Other Ambulatory Visit (HOSPITAL_COMMUNITY): Payer: Self-pay

## 2021-07-14 MED FILL — Tiotropium Bromide Monohydrate Inhal Aerosol 1.25 MCG/ACT: RESPIRATORY_TRACT | 30 days supply | Qty: 4 | Fill #7 | Status: AC

## 2021-07-14 MED FILL — Budesonide-Formoterol Fumarate Dihyd Aerosol 160-4.5 MCG/ACT: RESPIRATORY_TRACT | 30 days supply | Qty: 10.2 | Fill #5 | Status: AC

## 2021-07-15 ENCOUNTER — Other Ambulatory Visit (HOSPITAL_COMMUNITY): Payer: Self-pay

## 2021-07-23 ENCOUNTER — Other Ambulatory Visit (HOSPITAL_COMMUNITY): Payer: Self-pay | Admitting: Orthopedic Surgery

## 2021-07-23 DIAGNOSIS — S83004A Unspecified dislocation of right patella, initial encounter: Secondary | ICD-10-CM

## 2021-07-23 DIAGNOSIS — M228X1 Other disorders of patella, right knee: Secondary | ICD-10-CM | POA: Diagnosis not present

## 2021-07-27 ENCOUNTER — Other Ambulatory Visit: Payer: Self-pay

## 2021-07-27 ENCOUNTER — Ambulatory Visit (HOSPITAL_COMMUNITY)
Admission: RE | Admit: 2021-07-27 | Discharge: 2021-07-27 | Disposition: A | Discharge status: Home / Self Care | Payer: 59 | Source: Ambulatory Visit | Attending: Orthopedic Surgery | Admitting: Orthopedic Surgery

## 2021-07-27 DIAGNOSIS — S83004A Unspecified dislocation of right patella, initial encounter: Secondary | ICD-10-CM

## 2021-07-27 DIAGNOSIS — M25561 Pain in right knee: Secondary | ICD-10-CM | POA: Diagnosis not present

## 2021-08-05 ENCOUNTER — Other Ambulatory Visit (HOSPITAL_COMMUNITY): Payer: Self-pay

## 2021-08-05 MED FILL — Omalizumab Subcutaneous Soln Prefilled Syringe 150 MG/ML: SUBCUTANEOUS | 28 days supply | Qty: 2 | Fill #9 | Status: AC

## 2021-08-19 MED FILL — Tiotropium Bromide Monohydrate Inhal Aerosol 1.25 MCG/ACT: RESPIRATORY_TRACT | 30 days supply | Qty: 4 | Fill #8 | Status: AC

## 2021-08-20 ENCOUNTER — Encounter: Payer: Self-pay | Admitting: Gastroenterology

## 2021-08-20 ENCOUNTER — Other Ambulatory Visit (HOSPITAL_COMMUNITY): Payer: Self-pay

## 2021-08-22 ENCOUNTER — Ambulatory Visit (AMBULATORY_SURGERY_CENTER): Payer: 59 | Admitting: Gastroenterology

## 2021-08-22 ENCOUNTER — Other Ambulatory Visit: Payer: Self-pay

## 2021-08-22 ENCOUNTER — Encounter: Payer: Self-pay | Admitting: Gastroenterology

## 2021-08-22 ENCOUNTER — Encounter: Payer: 59 | Admitting: Gastroenterology

## 2021-08-22 VITALS — BP 107/80 | HR 75 | Temp 99.1°F | Resp 13 | Ht 70.0 in | Wt 219.0 lb

## 2021-08-22 DIAGNOSIS — R0789 Other chest pain: Secondary | ICD-10-CM | POA: Diagnosis not present

## 2021-08-22 DIAGNOSIS — R1319 Other dysphagia: Secondary | ICD-10-CM

## 2021-08-22 DIAGNOSIS — R12 Heartburn: Secondary | ICD-10-CM | POA: Diagnosis not present

## 2021-08-22 DIAGNOSIS — R1310 Dysphagia, unspecified: Secondary | ICD-10-CM | POA: Diagnosis not present

## 2021-08-22 MED ORDER — SODIUM CHLORIDE 0.9 % IV SOLN: 500.0000 mL | Freq: Once | INTRAVENOUS | Status: DC

## 2021-08-22 NOTE — Progress Notes (Signed)
Capsule expiration date- 07-29-2021  Capsule ID number [id number]  LES measurement: 40 (capsule placed 6 cm above LES) 34  Time of implant: 0815

## 2021-08-22 NOTE — Op Note (Signed)
[place] Patient Name: Tammy Chapman Procedure Date: 08/22/2021 7:59 AM MRN: [id number] Endoscopist: Mallie Mussel L. Loletha Carrow , MD Age: 31 Referring MD:  Date of Birth: 1990-09-27 Gender: Female Account #: [id number] Procedure:                Upper GI endoscopy Indications:              Esophageal dysphagia, Heartburn Medicines:                Monitored Anesthesia Care Procedure:                Pre-Anesthesia Assessment:                           - Prior to the procedure, a History and Physical                            was performed, and patient medications and                            allergies were reviewed. The patient's tolerance of                            previous anesthesia was also reviewed. The risks                            and benefits of the procedure and the sedation                            options and risks were discussed with the patient.                            All questions were answered, and informed consent                            was obtained. Prior Anticoagulants: The patient has                            taken no previous anticoagulant or antiplatelet                            agents. ASA Grade Assessment: II - A patient with                            mild systemic disease. After reviewing the risks                            and benefits, the patient was deemed in                            satisfactory condition to undergo the procedure.                           After obtaining informed consent, the endoscope was  passed under direct vision. Throughout the                            procedure, the patient's blood pressure, pulse, and                            oxygen saturations were monitored continuously. The                            GIF [id number] #[id number] was introduced through the                            mouth, and advanced to the second part of duodenum.                            The upper GI endoscopy was  accomplished without                            difficulty. The patient tolerated the procedure                            well. Scope In: Scope Out: Findings:                 The esophagus was normal. The BRAVO capsule with                            delivery system was introduced through the mouth                            and advanced into the esophagus, such that the                            BRAVO pH capsule was positioned 34 cm from the                            incisors, which was 6 cm proximal to the GE                            junction. The BRAVO pH capsule was then deployed                            and attached to the esophageal mucosa. The delivery                            system was then withdrawn. Endoscopy was utilized                            for probe placement and diagnostic evaluation. The                            scope was reinserted to evaluate placement of the  BRAVO capsule. Visualization showed the BRAVO                            capsule to be in an appropriate position.                           The stomach was normal.                           The cardia and gastric fundus were normal on                            retroflexion.                           The examined duodenum was normal. Complications:            No immediate complications. Estimated Blood Loss:     Estimated blood loss: none. Impression:               - Normal esophagus.                           - Normal stomach.                           - Normal examined duodenum.                           - The BRAVO pH capsule was deployed.                           - No specimens collected. Recommendation:           - Patient has a contact number available for                            emergencies. The signs and symptoms of potential                            delayed complications were discussed with the                            patient. Return to normal  activities tomorrow.                            Written discharge instructions were provided to the                            patient.                           - Resume previous diet.                           - Continue present medications. (Dexilant can be  resumed in 48 hours)                           - Follow up after pH study has been read. Kleber Crean L. Loletha Carrow, MD 08/22/2021 8:24:43 AM This report has been signed electronically.

## 2021-08-22 NOTE — Patient Instructions (Signed)
Continue medications- resume Dexilant in 48 hours  Post-op Bravo pH instructions Once you get home:  Eat normally and go about your daily routine/activities Limit drinking fluids or eating between meals Do not chew gum or eat hard candy DO NOT take any antacid or anti-reflux medications during the 48-hour monitoring time, unless instructed by your physician  Recording events: Events to be recorded are:  Record using event buttons on recorder and write on paper diary form Every time you eat or drink something (other than water) 2.   Periods of lying down/reclining 3.  Symptoms:  may include heartburn, regurgitation, chest pain, cough or specify if other.  A paper diary is also provided to record the times of your reflux symptoms and times for meals and when you lie down.  The recorder needs to remain within 3 feet (arms length) of you during the testing period (48 hours). If you should forget and move outside of a 3-foot radius of the receiver you may hear beeping and you will see a C1 error in the display window on the top of the receiver.  Please pick up the receiver and hold close to you to re-establish the connection and the error message disappears.  You may take a bath/shower during the testing period, but the recorder must not get wet and must remain within 3 feet of you. Please leave the receiver outside of the shower or tub while bathing. The monitoring period will be for 48 hours after placement of the capsule.  At the end of the 48 hours, you will return the recorder, and your diary, to our [facility] front desk.  A nurse will meet you to collect the device and answer any questions you may have.  The device should turn off once the 48 hours is complete.   What to expect after placement of the capsule:  Some patients experience a vague sensation that something is in their esophagus or that they feel the capsule when they swallow food.  Should you experience  this, chewing food carefully or drinking liquids may minimize this sensation.   After the test is complete, the disposable capsule will fall off the wall of your esophagus within 5-10 days and pass naturally with your bowel movement through the digestive tract.  Once the recorder is returned, your provider will review and interpret your recordings and contact you to discuss your results.  This may take up to two weeks.   DO NOT have an MRI for 30 days after your procedure to ensure the capsule is no longer inside your body  It is imperative that you return the recorder on Monday 08-26-21 by 3:00pm.  Your information must be downloaded at this time to obtain your results.       YOU HAD AN ENDOSCOPIC PROCEDURE TODAY AT [facility]:   Refer to the procedure report that was given to you for any specific questions about what was found during the examination.  If the procedure report does not answer your questions, please call your gastroenterologist to clarify.  If you requested that your care partner not be given the details of your procedure findings, then the procedure report has been included in a sealed envelope for you to review at your convenience later.  YOU SHOULD EXPECT: Some feelings of bloating in the abdomen. Passage of more gas than usual.  Walking can help get rid of the air that was put into your GI tract during the procedure  and reduce the bloating.  Please Note:  You might notice some irritation and congestion in your nose or some drainage.  This is from the oxygen used during your procedure.  There is no need for concern and it should clear up in a day or so.  SYMPTOMS TO REPORT IMMEDIATELY:  Following upper endoscopy (EGD)  Vomiting of blood or coffee ground material  New chest pain or pain under the shoulder blades  Painful or persistently difficult swallowing  New shortness of breath  Fever of 100F or higher  Black, tarry-looking stools  For urgent or  emergent issues, a gastroenterologist can be reached at any hour by calling [phone]. Do not use MyChart messaging for urgent concerns.    DIET:  We do recommend a small meal at first, but then you may proceed to your regular diet.  Drink plenty of fluids but you should avoid alcoholic beverages for 24 hours.  ACTIVITY:  You should plan to take it easy for the rest of today and you should NOT DRIVE or use heavy machinery until tomorrow (because of the sedation medicines used during the test).    FOLLOW UP: Our staff will call the number listed on your records 48-72 hours following your procedure to check on you and address any questions or concerns that you may have regarding the information given to you following your procedure. If we do not reach you, we will leave a message.  We will attempt to reach you two times.  During this call, we will ask if you have developed any symptoms of COVID 19. If you develop any symptoms (ie: fever, flu-like symptoms, shortness of breath, cough etc.) before then, please call [phone].  If you test positive for Covid 19 in the 2 weeks post procedure, please call and report this information to [place].    If any biopsies were taken you will be contacted by phone or by letter within the next 1-3 weeks.  Please call us at [phone] if you have not heard about the biopsies in 3 weeks.    SIGNATURES/CONFIDENTIALITY: You and/or your care partner have signed paperwork which will be entered into your electronic medical record.  These signatures attest to the fact that that the information above on your After Visit Summary has been reviewed and is understood.  Full responsibility of the confidentiality of this discharge information lies with you and/or your care-partner.

## 2021-08-22 NOTE — Progress Notes (Signed)
VS by CW. ?

## 2021-08-22 NOTE — Progress Notes (Signed)
To pacu, VSS report to RN.tb

## 2021-08-22 NOTE — Progress Notes (Signed)
History and Physical:  This patient presents for endoscopic testing for: Encounter Diagnoses  Name Primary?   Esophageal dysphagia Yes   Heartburn     06/28/21 office consult note with clinical details. No changes since then  ROS: Patient denies chest pain or cough   Past Medical History: Past Medical History:  Diagnosis Date   Allergy    Asthma    Bronchitis, mucopurulent recurrent ([place]) 01/19/2019   Chronic asthma 01/19/2019   Loose body of right knee 02/2013   Pregnancy induced hypertension    Recurrent otitis media of both ears 01/19/2019   Sprain and strain of medial collateral ligament of knee 02/2013   right     Past Surgical History: Past Surgical History:  Procedure Laterality Date   CESAREAN SECTION N/A 01/08/2017   Procedure: CESAREAN SECTION;  Surgeon: Jonnie Kind, MD;  Location: [place];  Service: Obstetrics;  Laterality: N/A;   CESAREAN SECTION N/A 07/01/2019   Procedure: REPEAT CESAREAN SECTION;  Surgeon: Jonnie Kind, MD;  Location: [facility];  Service: Obstetrics;  Laterality: N/A;   CHOLECYSTECTOMY  01/20/2008   EAR TUBE REMOVAL  2002   ESOPHAGOGASTRODUODENOSCOPY  05/15/2011   Procedure: ESOPHAGOGASTRODUODENOSCOPY (EGD);  Surgeon: Rogene Houston, MD;  Location: [facility];  Service: Endoscopy;  Laterality: N/A;  3:15    KNEE ARTHROSCOPY Right 03/15/2013   Procedure: RIGHT  KNEE ARTHROSCOPY, FEMORAL PATELLA REEFING, LATERAL RELEASE, PARTIAL LATERAL MENISCECTOMY ;  Surgeon: Lorn Junes, MD;  Location: [place];  Service: Orthopedics;  Laterality: Right;   TONSILLECTOMY AND ADENOIDECTOMY  1999   TYMPANOSTOMY [place], 2011    Allergies: Allergies  Allergen Reactions   Benadryl [Diphenhydramine Hcl] Shortness Of Breath    Pt states that she feels fine if she sits down, but if she is moving around after taking benadryl, she feels SOB   Penicillins Shortness Of Breath    Has patient had a PCN reaction  causing immediate rash, facial/tongue/throat swelling, SOB or lightheadedness with hypotension: yes Has patient had a PCN reaction causing severe rash involving mucus membranes or skin necrosis: no Has patient had a PCN reaction that required hospitalization: no Has patient had a PCN reaction occurring within the last 10 years: no If all of the above answers are "NO", then may proceed with Cephalosporin use. Patient reports she CAN TAKE kEFLEX AND SUPRAX   Adhesive [Tape] Hives   Percocet [Oxycodone-Acetaminophen]    Lactose Intolerance (Gi) Diarrhea and Nausea And Vomiting   Latex Rash   Omnipaque [Iohexol] Itching    Slight itching in mouth and tongue. No swelling, wheezing, or hives.   Sulfa Antibiotics Rash   Ultram [Tramadol Hcl] Rash    Outpatient Meds: Current Outpatient Medications  Medication Sig Dispense Refill   albuterol (VENTOLIN HFA) 108 (90 Base) MCG/ACT inhaler Inhale 2 puffs into the lungs every 6 (six) hours as needed for wheezing or shortness of breath. 18 g 2   aspirin EC 81 MG tablet Take 81 mg by mouth daily. Swallow whole.     Azelaic Acid (FINACEA) 15 % gel Apply topically to face up to twice daily, 50 g PRN   budesonide-formoterol (SYMBICORT) 160-4.5 MCG/ACT inhaler INHALE 2 PUFFS BY MOUTH 2 TIMES DAILY 10.2 g 11   hyoscyamine (LEVBID) 0.375 MG 12 hr tablet TAKE 1 TABLET BY MOUTH 2 TIMES DAILY 60 tablet 1   levocetirizine (XYZAL) 5 MG tablet TAKE 1 TABLET BY MOUTH EVERY EVENING 30  tablet 10   metroNIDAZOLE (METROCREAM) 0.75 % cream Apply topically to face up to 2 times a day 45 g 12   montelukast (SINGULAIR) 10 MG tablet TAKE 1 TABLET BY MOUTH AT BEDTIME 30 tablet 10   sucralfate (CARAFATE) 1 g tablet Take 1 tablet (1 g total) by mouth 3 (three) times daily. Dissolve tablet in 30cc water to make a slurry 42 tablet 1   Tiotropium Bromide Monohydrate (SPIRIVA RESPIMAT) 1.25 MCG/ACT AERS Spiriva Respimat 1.25 mcg/actuation solution for inhalation     Tiotropium  Bromide Monohydrate 1.25 MCG/ACT AERS INHALE 2 PUFFS INTO THE LUNGS DAILY. 4 g 12   dexlansoprazole (DEXILANT) 60 MG capsule Take 1 capsule (60 mg total) by mouth daily. 90 capsule 1   medroxyPROGESTERone (PROVERA) 10 MG tablet TAKE 1 TABLET BY MOUTH ONCE A DAY FOR 2 WEEKS, REPEAT AS DIRECTED 14 tablet 3   omalizumab (XOLAIR) 150 MG/ML prefilled syringe INJECT 300 MG INTO THE SKIN EVERY 28 DAYS. 2 mL 11   PHEXXI 1.8-1-0.4 % GEL PLACE 1 APPLICATORFUL VAGINALLY AS NEEDED. IMMEDIATELY BEFORE OR UP TO 1 HOUR BEFORE SEX 60 g 11   Current Facility-Administered Medications  Medication Dose Route Frequency Provider Last Rate Last Admin   0.9 %  sodium chloride infusion  500 mL Intravenous Once Doran Stabler, MD          ___________________________________________________________________ Objective   Exam:  BP 115/75    Pulse 81    Temp 99.1 F (37.3 C)    Resp 12    Ht 5\' 10"  (1.778 m)    Wt 219 lb (99.3 kg)    LMP 08/04/2021    SpO2 100%    BMI 31.42 kg/m   CV: RRR without murmur, S1/S2 Resp: clear to auscultation bilaterally, normal RR and effort noted GI: soft, no tenderness, with active bowel sounds.   Assessment: Encounter Diagnoses  Name Primary?   Esophageal dysphagia Yes   Heartburn      Plan:  EGD with B[facility]   The patient is appropriate for an endoscopic procedure in the ambulatory setting.   - Wilfrid Lund, MD

## 2021-08-22 NOTE — Progress Notes (Signed)
Called to room to assist during endoscopic procedure.  Patient ID and intended procedure confirmed with present staff. Received instructions for my participation in the procedure from the performing physician.  

## 2021-08-26 ENCOUNTER — Telehealth: Payer: Self-pay

## 2021-08-26 NOTE — Telephone Encounter (Signed)
°  Follow up Call-  Call back number 08/22/2021 10/24/2020  Post procedure Call Back phone  # [phone]  Permission to leave phone message Yes Yes  Some recent data might be hidden     Patient questions:  Do you have a fever, pain , or abdominal swelling? No. Pain Score  0 *  Have you tolerated food without any problems? Yes.    Have you been able to return to your normal activities? Yes.    Do you have any questions about your discharge instructions: Diet   No. Medications  No. Follow up visit  No.  Do you have questions or concerns about your Care? No.  Actions: * If pain score is 4 or above: No action needed, pain <4.

## 2021-08-29 ENCOUNTER — Other Ambulatory Visit (HOSPITAL_COMMUNITY): Payer: Self-pay

## 2021-08-29 MED FILL — Omalizumab Subcutaneous Soln Prefilled Syringe 150 MG/ML: SUBCUTANEOUS | 28 days supply | Qty: 2 | Fill #10 | Status: CN

## 2021-09-03 ENCOUNTER — Telehealth: Payer: Self-pay

## 2021-09-03 NOTE — Telephone Encounter (Signed)
Per Rinaldo Ratel, current Prior Authorization is expiring.  Submitted a Prior Authorization request to [facility] for XOLAIR via CoverMyMeds. Will update once we receive a response.   Key: [id number]

## 2021-09-03 NOTE — Telephone Encounter (Signed)
Received notification from [facility] regarding a prior authorization for XOLAIR. Authorization has been APPROVED from 09/03/2021 to 09/02/2022. Approval letter sent to scan center. Updated [employer] with current Prior Authorization information.  Authorization # [id number]

## 2021-09-09 ENCOUNTER — Other Ambulatory Visit (HOSPITAL_COMMUNITY): Payer: Self-pay

## 2021-09-09 MED FILL — Omalizumab Subcutaneous Soln Prefilled Syringe 150 MG/ML: SUBCUTANEOUS | 28 days supply | Qty: 2 | Fill #10 | Status: AC

## 2021-09-10 ENCOUNTER — Other Ambulatory Visit (HOSPITAL_COMMUNITY): Payer: Self-pay

## 2021-09-12 ENCOUNTER — Other Ambulatory Visit (HOSPITAL_COMMUNITY): Payer: Self-pay

## 2021-09-13 ENCOUNTER — Other Ambulatory Visit: Payer: Self-pay

## 2021-09-13 ENCOUNTER — Ambulatory Visit: Payer: 59 | Admitting: Nurse Practitioner

## 2021-09-13 ENCOUNTER — Encounter: Payer: Self-pay | Admitting: Nurse Practitioner

## 2021-09-13 ENCOUNTER — Other Ambulatory Visit (HOSPITAL_COMMUNITY): Payer: Self-pay

## 2021-09-13 VITALS — BP 108/80 | HR 85 | Temp 98.2°F | Wt 231.4 lb

## 2021-09-13 DIAGNOSIS — M5416 Radiculopathy, lumbar region: Secondary | ICD-10-CM | POA: Diagnosis not present

## 2021-09-13 MED ORDER — PREDNISONE 20 MG PO TABS
40.0000 mg | ORAL_TABLET | Freq: Every day | ORAL | 0 refills | Status: DC
  Filled 2021-09-13: qty 10, 5d supply, fill #0

## 2021-09-13 MED ORDER — CYCLOBENZAPRINE HCL 5 MG PO TABS
5.0000 mg | ORAL_TABLET | Freq: Two times a day (BID) | ORAL | 0 refills | Status: DC | PRN
  Filled 2021-09-13: qty 30, 15d supply, fill #0

## 2021-09-13 NOTE — Patient Instructions (Signed)
Ask Dr. Ronnald Ramp about Metformin for PCOS/weight loss/conceiving
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
## 2021-09-18 ENCOUNTER — Encounter: Payer: Self-pay | Admitting: Gastroenterology

## 2021-09-22 ENCOUNTER — Other Ambulatory Visit: Payer: Self-pay | Admitting: Emergency Medicine

## 2021-09-22 MED FILL — Tiotropium Bromide Monohydrate Inhal Aerosol 1.25 MCG/ACT: RESPIRATORY_TRACT | 30 days supply | Qty: 4 | Fill #9 | Status: AC

## 2021-09-23 ENCOUNTER — Other Ambulatory Visit (HOSPITAL_COMMUNITY): Payer: Self-pay

## 2021-09-24 ENCOUNTER — Other Ambulatory Visit: Payer: Self-pay

## 2021-09-24 ENCOUNTER — Other Ambulatory Visit (HOSPITAL_COMMUNITY): Payer: Self-pay

## 2021-09-24 ENCOUNTER — Ambulatory Visit: Payer: 59 | Admitting: Internal Medicine

## 2021-09-24 ENCOUNTER — Encounter: Payer: Self-pay | Admitting: Internal Medicine

## 2021-09-24 VITALS — BP 126/88 | HR 90 | Temp 98.4°F | Resp 16 | Ht 70.0 in | Wt 229.0 lb

## 2021-09-24 DIAGNOSIS — J0100 Acute maxillary sinusitis, unspecified: Secondary | ICD-10-CM

## 2021-09-24 DIAGNOSIS — E282 Polycystic ovarian syndrome: Secondary | ICD-10-CM

## 2021-09-24 MED ORDER — PROMETHAZINE-DM 6.25-15 MG/5ML PO SYRP
5.0000 mL | ORAL_SOLUTION | Freq: Four times a day (QID) | ORAL | 0 refills | Status: AC | PRN
  Filled 2021-09-24: qty 118, 6d supply, fill #0

## 2021-09-24 MED ORDER — CEFDINIR 300 MG PO CAPS
300.0000 mg | ORAL_CAPSULE | Freq: Two times a day (BID) | ORAL | 0 refills | Status: AC
  Filled 2021-09-24: qty 20, 10d supply, fill #0

## 2021-09-24 MED ORDER — BUDESONIDE-FORMOTEROL FUMARATE 160-4.5 MCG/ACT IN AERO
INHALATION_SPRAY | RESPIRATORY_TRACT | 11 refills | Status: DC
  Filled 2021-09-24: qty 10.2, 30d supply, fill #0
  Filled 2022-01-06: qty 10.2, 30d supply, fill #1
  Filled 2022-02-04: qty 10.2, 30d supply, fill #2
  Filled 2022-05-01: qty 10.2, 30d supply, fill #3
  Filled 2022-06-12: qty 10.2, 30d supply, fill #4
  Filled 2022-09-02: qty 10.2, 30d supply, fill #5

## 2021-09-24 MED ORDER — METFORMIN HCL ER 750 MG PO TB24
750.0000 mg | ORAL_TABLET | Freq: Every day | ORAL | 1 refills | Status: DC
  Filled 2021-09-24: qty 90, 90d supply, fill #0

## 2021-09-24 NOTE — Progress Notes (Signed)
Subjective:  Patient ID: Tammy Chapman, female    DOB: [birth year]  Age: 31 y.o. MRN: [id number]  CC: URI  This visit occurred during the SARS-CoV-2 public health emergency.  Safety protocols were in place, including screening questions prior to the visit, additional usage of staff PPE, and extensive cleaning of exam room while observing appropriate contact time as indicated for disinfecting solutions.    HPI Tammy Chapman presents for f/up -  She complains of a 3-day history of facial pain, purulent nasal Phrenilin, sore throat, nonproductive cough, chills, and earaches.  Her at-home COVID test was negative.  She also wants to take metformin for PCOS.   Outpatient Medications Prior to Visit  Medication Sig Dispense Refill   albuterol (VENTOLIN HFA) 108 (90 Base) MCG/ACT inhaler Inhale 2 puffs into the lungs every 6 (six) hours as needed for wheezing or shortness of breath. 18 g 2   aspirin EC 81 MG tablet Take 81 mg by mouth daily. Swallow whole.     Azelaic Acid (FINACEA) 15 % gel Apply topically to face up to twice daily, 50 g PRN   budesonide-formoterol (SYMBICORT) 160-4.5 MCG/ACT inhaler INHALE 2 PUFFS BY MOUTH 2 TIMES DAILY 10.2 g 11   cyclobenzaprine (FLEXERIL) 5 MG tablet Take 1 tablet (5 mg total) by mouth 2 (two) times daily as needed for muscle spasms. 30 tablet 0   dexlansoprazole (DEXILANT) 60 MG capsule Take 1 capsule (60 mg total) by mouth daily. 90 capsule 1   hyoscyamine (LEVBID) 0.375 MG 12 hr tablet TAKE 1 TABLET BY MOUTH 2 TIMES DAILY 60 tablet 1   levocetirizine (XYZAL) 5 MG tablet TAKE 1 TABLET BY MOUTH EVERY EVENING 30 tablet 10   metroNIDAZOLE (METROCREAM) 0.75 % cream Apply topically to face up to 2 times a day 45 g 12   montelukast (SINGULAIR) 10 MG tablet TAKE 1 TABLET BY MOUTH AT BEDTIME 30 tablet 10   omalizumab (XOLAIR) 150 MG/ML prefilled syringe INJECT 300 MG INTO THE SKIN EVERY 28 DAYS. 2 mL 11   PHEXXI 1.8-1-0.4 % GEL PLACE 1 APPLICATORFUL VAGINALLY AS  NEEDED. IMMEDIATELY BEFORE OR UP TO 1 HOUR BEFORE SEX 60 g 11   predniSONE (DELTASONE) 20 MG tablet Take 2 tablets (40 mg total) by mouth daily with breakfast. 10 tablet 0   sucralfate (CARAFATE) 1 g tablet Take 1 tablet (1 g total) by mouth 3 (three) times daily. Dissolve tablet in 30cc water to make a slurry 42 tablet 1   Tiotropium Bromide Monohydrate (SPIRIVA RESPIMAT) 1.25 MCG/ACT AERS Spiriva Respimat 1.25 mcg/actuation solution for inhalation     Tiotropium Bromide Monohydrate 1.25 MCG/ACT AERS INHALE 2 PUFFS INTO THE LUNGS DAILY. 4 g 12   medroxyPROGESTERone (PROVERA) 10 MG tablet TAKE 1 TABLET BY MOUTH ONCE A DAY FOR 2 WEEKS, REPEAT AS DIRECTED 14 tablet 3   No facility-administered medications prior to visit.    ROS Review of Systems  Constitutional:  Positive for chills. Negative for fatigue and fever.  HENT:  Positive for sore throat. Negative for trouble swallowing and voice change.   Respiratory:  Positive for cough. Negative for chest tightness, shortness of breath and wheezing.   Cardiovascular:  Negative for chest pain, palpitations and leg swelling.  Gastrointestinal:  Negative for abdominal pain, diarrhea, nausea and vomiting.  Genitourinary:  Negative for difficulty urinating.  Musculoskeletal: Negative.   Skin: Negative.  Negative for rash.  Neurological:  Negative for dizziness, weakness and headaches.  Hematological:  Negative for adenopathy. Does  not bruise/bleed easily.  Psychiatric/Behavioral: Negative.     Objective:  BP 126/88 (BP Location: Left Arm, Patient Position: Sitting, Cuff Size: Large)    Pulse 90    Temp 98.4 F (36.9 C) (Oral)    Resp 16    Ht 5\' 10"  (1.778 m)    Wt 229 lb (103.9 kg)    LMP 08/31/2021 (Exact Date)    SpO2 99%    BMI 32.86 kg/m   BP Readings from Last 3 Encounters:  09/24/21 126/88  09/13/21 108/80  08/22/21 107/80    Wt Readings from Last 3 Encounters:  09/24/21 229 lb (103.9 kg)  09/13/21 231 lb 6.4 oz (105 kg)  08/22/21  219 lb (99.3 kg)    Physical Exam Constitutional:      General: She is not in acute distress.    Appearance: She is not ill-appearing or toxic-appearing.  HENT:     Mouth/Throat:     Lips: Pink.     Mouth: Mucous membranes are moist.     Pharynx: Posterior oropharyngeal erythema present. No pharyngeal swelling, oropharyngeal exudate or uvula swelling.     Tonsils: No tonsillar exudate or tonsillar abscesses.  Eyes:     General: No scleral icterus.    Conjunctiva/sclera: Conjunctivae normal.  Cardiovascular:     Rate and Rhythm: Normal rate and regular rhythm.     Heart sounds: No murmur heard. Pulmonary:     Breath sounds: No stridor. No wheezing, rhonchi or rales.  Abdominal:     General: Abdomen is flat.     Palpations: There is no mass.     Tenderness: There is no abdominal tenderness. There is no guarding.     Hernia: No hernia is present.  Musculoskeletal:     Cervical back: Neck supple.  Lymphadenopathy:     Cervical: No cervical adenopathy.  Skin:    General: Skin is warm and dry.     Coloration: Skin is not pale.  Neurological:     General: No focal deficit present.     Mental Status: She is alert.  Psychiatric:        Mood and Affect: Mood normal.        Behavior: Behavior normal.    Lab Results  Component Value Date   WBC 7.2 09/25/2020   HGB 14.2 09/25/2020   HCT 43.3 09/25/2020   PLT 225.0 09/25/2020   GLUCOSE 93 09/25/2020   CHOL 201 (H) 02/27/2021   TRIG 89.0 02/27/2021   HDL 53.30 02/27/2021   LDLCALC 129 (H) 02/27/2021   ALT 12 09/25/2020   AST 8 09/25/2020   NA 136 09/25/2020   K 3.7 09/25/2020   CL 101 09/25/2020   CREATININE 0.75 09/25/2020   BUN 9 09/25/2020   CO2 29 09/25/2020   TSH 0.84 09/25/2020    MR KNEE RIGHT WO CONTRAST  Result Date: 07/28/2021 CLINICAL DATA:  Right knee pain.  Patellar dislocation. EXAM: MRI OF THE RIGHT KNEE WITHOUT CONTRAST TECHNIQUE: Multiplanar, multisequence MR imaging of the knee was performed. No  intravenous contrast was administered. COMPARISON:  10/11/2017 FINDINGS: MENISCI Medial: Intact. Lateral: No recurrent tear. Postop surgical changes of the lateral meniscus. LIGAMENTS Cruciates: ACL and PCL are intact. Collaterals: Medial collateral ligament is intact. Lateral collateral ligament complex is intact. 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 No focal chondral defect. Medial:  No chondral defect. Lateral:  No chondral defect. JOINT: No joint effusion. Edema in superolateral Hoffa's fat as  can be seen with patellar tendon-lateral femoral condyle friction syndrome. No plical thickening. POPLITEAL FOSSA: Popliteus tendon is intact. No Baker's cyst. EXTENSOR MECHANISM: Intact quadriceps tendon. Intact patellar tendon. Intact lateral patellar retinaculum. Intact medial patellar retinaculum. Intact MPFL. TT-TG distance 10 mm. BONES: No aggressive osseous lesion. No fracture or dislocation. Other: No fluid collection or hematoma. Muscles are normal. IMPRESSION: 1. No meniscal or ligamentous injury of the right knee. 2. 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 No focal chondral defect. 3. Edema in superolateral Hoffa's fat as can be seen with patellar tendon-lateral femoral condyle friction syndrome. Electronically Signed   By: Kathreen Devoid M.D.   On: 07/28/2021 09:18    Assessment & Plan:   Tishie was seen today for uri.  Diagnoses and all orders for this visit:  Acute non-recurrent maxillary sinusitis -     cefdinir (OMNICEF) 300 MG capsule; Take 1 capsule by mouth 2 times daily for 10 days. -     promethazine-dextromethorphan (PROMETHAZINE-DM) 6.25-15 MG/5ML syrup; Take 5 mLs by mouth 4 times daily as needed for cough.  PCOS (polycystic ovarian syndrome) -     metFORMIN (GLUCOPHAGE XR) 750 MG 24 hr tablet; Take 1 tablet by mouth daily with breakfast.   I am having Katlyne Storer start on cefdinir,  promethazine-dextromethorphan, and metFORMIN. I am also having her maintain her Phexxi, medroxyPROGESTERone, hyoscyamine, Tiotropium Bromide Monohydrate, omalizumab, aspirin EC, albuterol, dexlansoprazole, montelukast, levocetirizine, metroNIDAZOLE, Azelaic Acid, sucralfate, Spiriva Respimat, predniSONE, cyclobenzaprine, and budesonide-formoterol.  Meds ordered this encounter  Medications   cefdinir (OMNICEF) 300 MG capsule    Sig: Take 1 capsule by mouth 2 times daily for 10 days.    Dispense:  20 capsule    Refill:  0   promethazine-dextromethorphan (PROMETHAZINE-DM) 6.25-15 MG/5ML syrup    Sig: Take 5 mLs by mouth 4 times daily as needed for cough.    Dispense:  118 mL    Refill:  0   metFORMIN (GLUCOPHAGE XR) 750 MG 24 hr tablet    Sig: Take 1 tablet by mouth daily with breakfast.    Dispense:  90 tablet    Refill:  1     Follow-up: Return if symptoms worsen or fail to improve.  Scarlette Calico, MD

## 2021-09-24 NOTE — Patient Instructions (Signed)

## 2021-09-30 ENCOUNTER — Other Ambulatory Visit (HOSPITAL_COMMUNITY): Payer: Self-pay

## 2021-09-30 ENCOUNTER — Encounter: Payer: Self-pay | Admitting: Internal Medicine

## 2021-09-30 MED ORDER — FLUCONAZOLE 150 MG PO TABS
ORAL_TABLET | ORAL | 1 refills | Status: DC
Start: 2021-09-30 — End: 2021-10-24
  Filled 2021-09-30: qty 2, 7d supply, fill #0

## 2021-10-01 DIAGNOSIS — M5441 Lumbago with sciatica, right side: Secondary | ICD-10-CM | POA: Diagnosis not present

## 2021-10-01 DIAGNOSIS — Z6832 Body mass index (BMI) 32.0-32.9, adult: Secondary | ICD-10-CM | POA: Diagnosis not present

## 2021-10-02 ENCOUNTER — Other Ambulatory Visit (HOSPITAL_COMMUNITY): Payer: Self-pay

## 2021-10-02 ENCOUNTER — Telehealth: Payer: Self-pay | Admitting: Internal Medicine

## 2021-10-02 ENCOUNTER — Other Ambulatory Visit: Payer: Self-pay | Admitting: Neurosurgery

## 2021-10-02 DIAGNOSIS — M5441 Lumbago with sciatica, right side: Secondary | ICD-10-CM

## 2021-10-04 ENCOUNTER — Other Ambulatory Visit (HOSPITAL_COMMUNITY): Payer: Self-pay

## 2021-10-04 ENCOUNTER — Ambulatory Visit: Payer: 59 | Attending: Internal Medicine | Admitting: Pharmacist

## 2021-10-04 ENCOUNTER — Other Ambulatory Visit: Payer: Self-pay

## 2021-10-04 DIAGNOSIS — Z7189 Other specified counseling: Secondary | ICD-10-CM

## 2021-10-04 MED ORDER — OMALIZUMAB 150 MG/ML ~~LOC~~ SOSY
PREFILLED_SYRINGE | SUBCUTANEOUS | 1 refills | Status: DC
  Filled 2021-10-04: qty 2, fill #0

## 2021-10-04 MED ORDER — OMALIZUMAB 150 MG/ML ~~LOC~~ SOSY
PREFILLED_SYRINGE | SUBCUTANEOUS | 1 refills | Status: DC
  Filled 2021-10-04: qty 2, 28d supply, fill #0
  Filled 2021-10-30: qty 2, 28d supply, fill #1

## 2021-10-04 NOTE — Telephone Encounter (Signed)
Refill sent for [facility] to [facility]: [phone]   Dose: 300 mg SQ every 4 weeks  Last OV: 01/31/21 Provider: Dr. Lamonte Sakai  Next OV: 6 months (but was not scheduled)  Knox Saliva, PharmD, MPH, BCPS Clinical Pharmacist (Rheumatology and Pulmonology)

## 2021-10-04 NOTE — Progress Notes (Signed)
° °  S: Patient presents for review of their specialty medication therapy.  Patient is currently taking Xolair for asthma. Patient is managed by Dr. Lamonte Sakai for this.   Adherence: confirmed   Efficacy: pt reports that this is working well.   Dosing: Give subcutaneously. Asthma: SubQ: 300 mg q4weeks  Dose adjustments: Renal: no dose adjustments  Hepatic: no dose adjustments  Toxicity: Severe hypersensitivity reaction or anaphylaxis: Discontinue treatment. Fever, arthralgia, and rash: Discontinue treatment if this constellation of symptoms occurs.  Drug-drug interactions: none   Monitoring: CV effects: none Eosinophilia and vasculitis: none Fever/arthralgia/rash: none Hypersensitivity/Anaphylaxis: none Malignant neoplasms: none  O:  Lab Results  Component Value Date   WBC 7.2 09/25/2020   HGB 14.2 09/25/2020   HCT 43.3 09/25/2020   MCV 81.2 09/25/2020   PLT 225.0 09/25/2020      Chemistry      Component Value Date/Time   NA 136 09/25/2020 1514   NA 137 02/04/2019 0959   K 3.7 09/25/2020 1514   CL 101 09/25/2020 1514   CO2 29 09/25/2020 1514   BUN 9 09/25/2020 1514   BUN 10 02/04/2019 0959   CREATININE 0.75 09/25/2020 1514   CREATININE 0.54 01/19/2019 1012      Component Value Date/Time   CALCIUM 9.4 09/25/2020 1514   ALKPHOS 86 09/25/2020 1514   AST 8 09/25/2020 1514   ALT 12 09/25/2020 1514   BILITOT 0.6 09/25/2020 1514   BILITOT 0.6 02/04/2019 0959       A/P: 1. Medication review: patient currently taking Xolair for asthma. Reviewed the medication with the patient, including the following: Xolair, omalizumab, is a novel IgE blocker.  It appears to reduce rates of hospitalizations, ER visits and unscheduled physician visits due asthma exacerbations when added to standard therapy.  Studies also show a reduction in steroid requirements and improvement in quality of life.  Patient educated on purpose, proper use and potential adverse effects of Xolair.   Following instruction patient verbalized understanding. Patient should always have an EpiPen readily available in the event of anaphylaxis. SubQ: For SubQ injection only; doses >150 mg should be divided over more than one injection site (eg, 225 mg or 300 mg administered as two injections, 375 mg administered as three injections); each injection site should be separated by ?1 inch. Do not inject into moles, scars, bruises, tender areas, or broken skin. Injections may take 5 to 10 seconds to administer (solution is slightly viscous). Administer only under direct medical supervision and observe patient for 2 hours after the first 3 injections and 30 minutes after subsequent injections Dellia Cloud 2015) or in accordance with individual institution policies and procedures. No recommendations for any changes at this time.   Benard Halsted, PharmD, [facility] [place], [place] [phone]

## 2021-10-07 ENCOUNTER — Other Ambulatory Visit (HOSPITAL_COMMUNITY): Payer: Self-pay

## 2021-10-07 NOTE — Telephone Encounter (Signed)
Patient is scheduled 10/24/2021 at 4:30pm with Dr. Lamonte Sakai

## 2021-10-20 ENCOUNTER — Ambulatory Visit
Admission: RE | Admit: 2021-10-20 | Discharge: 2021-10-20 | Disposition: A | Discharge status: Home / Self Care | Payer: 59 | Source: Ambulatory Visit | Attending: Neurosurgery | Admitting: Neurosurgery

## 2021-10-20 ENCOUNTER — Other Ambulatory Visit: Payer: Self-pay

## 2021-10-20 DIAGNOSIS — M5441 Lumbago with sciatica, right side: Secondary | ICD-10-CM

## 2021-10-20 DIAGNOSIS — M4727 Other spondylosis with radiculopathy, lumbosacral region: Secondary | ICD-10-CM | POA: Diagnosis not present

## 2021-10-24 ENCOUNTER — Encounter: Payer: Self-pay | Admitting: Emergency Medicine

## 2021-10-24 ENCOUNTER — Ambulatory Visit: Payer: 59 | Admitting: Emergency Medicine

## 2021-10-24 ENCOUNTER — Other Ambulatory Visit (HOSPITAL_COMMUNITY): Payer: Self-pay

## 2021-10-24 ENCOUNTER — Other Ambulatory Visit: Payer: Self-pay

## 2021-10-24 VITALS — BP 124/76 | HR 96 | Temp 98.7°F | Ht 70.0 in | Wt 231.8 lb

## 2021-10-24 DIAGNOSIS — K219 Gastro-esophageal reflux disease without esophagitis: Secondary | ICD-10-CM

## 2021-10-24 DIAGNOSIS — J301 Allergic rhinitis due to pollen: Secondary | ICD-10-CM | POA: Diagnosis not present

## 2021-10-24 DIAGNOSIS — J4542 Moderate persistent asthma with status asthmaticus: Secondary | ICD-10-CM

## 2021-10-24 MED ORDER — PREDNISONE 20 MG PO TABS
20.0000 mg | ORAL_TABLET | Freq: Every day | ORAL | 0 refills | Status: AC
  Filled 2021-10-24: qty 5, 5d supply, fill #0

## 2021-10-24 NOTE — Progress Notes (Signed)
? ?  Subjective:  ? ? Patient ID: Tammy Chapman, female    DOB: [birth year], 31 y.o.   MRN: [id number] ? ? ?HPI ? ?ROV 01/31/2021 -- 31 year old woman who has moderate persistent asthma as well as upper airway irritation syndrome.  Both are impacted by her GERD and allergic rhinitis due to multiple allergies.  She has an elevated IgE, is on Xolair since 10/2019, getting every 4 weeks.  We have been managing her on Symbicort.  Spiriva Respimat was added earlier this year and she feels that she has benefited.  Remains on Singulair, Xyzal.  She was dealing with clearing cough and was using Tussionex, not requiring currently.  ?Her breathing and cough are more difficult at the end of the month before it is time for next Xolair ? ?ROV 10/24/21 --follow-up visit 31 year old woman with moderate persistent asthma as well as upper airway irritation syndrome and chronic cough.  She has a significant allergic phenotype and is on Xolair every 4 weeks.  Bronchodilator regimen includes Symbicort, Spiriva.  She is also on Singulair, Xyzal.  She has a history of GERD but has been able to come off of therapy. ?Today she reports that she has been having some increased cough for about 2 weeks. Only occasional albuterol use, few times a month.  ? ? ?Review of Systems ?As per HPI ? ? ? ?   ?Objective:  ? Physical Exam ?Today's Vitals  ? 10/24/21 1625  ?BP: 124/76  ?Pulse: 96  ?Temp: 98.7 ?F (37.1 ?C)  ?TempSrc: Oral  ?SpO2: 96%  ?Weight: 231 lb 12.8 oz (105.1 kg)  ?Height: '5\' 10"'$  (1.778 m)  ? ?Body mass index is 33.26 kg/m?.  ? ?Gen: Pleasant, well-nourished, in no distress,  normal affect ? ?ENT: No lesions,  mouth clear,  oropharynx clear, no postnasal drip ? ?Neck: No JVD, no UA noise ? ?Lungs: No use of accessory muscles, no crackles or wheezing on normal respiration, no wheeze on forced expiration ? ?Cardiovascular: RRR, heart sounds normal, no murmur or gallops, no peripheral edema ? ?Musculoskeletal: No deformities, no cyanosis or  clubbing ? ?Neuro: alert, awake, non focal ? ?Skin: Warm, no lesions or rash ? ?   ?Assessment & Plan:  ?Chronic asthma ?Overall doing fairly well.  Only uses albuterol a few times a month.  She has had dry cough for about 2 weeks, may be upper airway in nature as she is not wheezing on exam.  I will give her prednisone for 5 days to see if we can resolve this.  Plan continue Spiriva, Symbicort and Xolair.  Continue her therapy for chronic rhinitis.  We will check an IgE and eosinophil count today. ? ?Please take prednisone 20 mg once daily for 5 days. ?Please continue your Symbicort and Spiriva as you have been taking them. ?Continue your Xolair treatments every 4 weeks ?We will do blood work today (IgE, CBC with differential) ?Follow Dr. Lamonte Sakai in 3 months or sooner if you have any problems. ? ?Allergic rhinitis ?Continue your Singulair 10 mg each evening ?Continue Xyzal once daily. ? ?Gastroesophageal reflux disease without esophagitis ?Not currently on therapy.  She had a reassuring Bravo test without any clear evidence for acid.  If her cough persists, worsens then I would consider empiric PPI therapy because she has benefited from this in the past. ? ? ?Baltazar Apo, MD, PhD ?10/24/2021, 4:57 PM ?[facility] ?[phone] or if no answer [phone] ? ?

## 2021-10-24 NOTE — Patient Instructions (Addendum)
Please take prednisone 20 mg once daily for 5 days. ?Please continue your Symbicort and Spiriva as you have been taking them. ?Continue your Xolair treatments every 4 weeks ?We will do blood work today (IgE, CBC with differential) ?Continue your Singulair 10 mg each evening ?Continue Xyzal once daily. ?Follow Dr. Lamonte Sakai in 3 months or sooner if you have any problems. ? ? ?

## 2021-10-24 NOTE — Assessment & Plan Note (Signed)
Not currently on therapy.  She had a reassuring Bravo test without any clear evidence for acid.  If her cough persists, worsens then I would consider empiric PPI therapy because she has benefited from this in the past. ?

## 2021-10-24 NOTE — Assessment & Plan Note (Signed)
Overall doing fairly well.  Only uses albuterol a few times a month.  She has had dry cough for about 2 weeks, may be upper airway in nature as she is not wheezing on exam.  I will give her prednisone for 5 days to see if we can resolve this.  Plan continue Spiriva, Symbicort and Xolair.  Continue her therapy for chronic rhinitis.  We will check an IgE and eosinophil count today. ? ?Please take prednisone 20 mg once daily for 5 days. ?Please continue your Symbicort and Spiriva as you have been taking them. ?Continue your Xolair treatments every 4 weeks ?We will do blood work today (IgE, CBC with differential) ?Follow Dr. Lamonte Sakai in 3 months or sooner if you have any problems. ?

## 2021-10-24 NOTE — Assessment & Plan Note (Signed)
Continue your Singulair 10 mg each evening ?Continue Xyzal once daily. ?

## 2021-10-25 ENCOUNTER — Other Ambulatory Visit (HOSPITAL_COMMUNITY): Payer: Self-pay

## 2021-10-25 LAB — CBC WITH DIFFERENTIAL/PLATELET
Basophils Absolute: 0.1 10*3/uL (ref 0.0–0.1)
Basophils Relative: 0.9 % (ref 0.0–3.0)
Eosinophils Absolute: 0.1 10*3/uL (ref 0.0–0.7)
Eosinophils Relative: 0.9 % (ref 0.0–5.0)
HCT: 41 % (ref 36.0–46.0)
Hemoglobin: 13.7 g/dL (ref 12.0–15.0)
Lymphocytes Relative: 40.8 % (ref 12.0–46.0)
Lymphs Abs: 3.1 10*3/uL (ref 0.7–4.0)
MCHC: 33.5 g/dL (ref 30.0–36.0)
MCV: 83.7 fl (ref 78.0–100.0)
Monocytes Absolute: 0.6 10*3/uL (ref 0.1–1.0)
Monocytes Relative: 8.3 % (ref 3.0–12.0)
Neutro Abs: 3.8 10*3/uL (ref 1.4–7.7)
Neutrophils Relative %: 49.1 % (ref 43.0–77.0)
Platelets: 270 10*3/uL (ref 150.0–400.0)
RBC: 4.91 Mil/uL (ref 3.87–5.11)
RDW: 13.5 % (ref 11.5–15.5)
WBC: 7.6 10*3/uL (ref 4.0–10.5)

## 2021-10-25 LAB — IGE: IgE (Immunoglobulin E), Serum: 589 kU/L — ABNORMAL HIGH (ref <=114)

## 2021-10-28 ENCOUNTER — Other Ambulatory Visit (HOSPITAL_COMMUNITY): Payer: Self-pay

## 2021-10-30 ENCOUNTER — Other Ambulatory Visit (HOSPITAL_COMMUNITY): Payer: Self-pay

## 2021-11-04 DIAGNOSIS — M5441 Lumbago with sciatica, right side: Secondary | ICD-10-CM | POA: Diagnosis not present

## 2021-11-26 ENCOUNTER — Other Ambulatory Visit: Payer: Self-pay | Admitting: Internal Medicine

## 2021-11-26 ENCOUNTER — Other Ambulatory Visit: Payer: Self-pay | Admitting: Pharmacist

## 2021-11-26 ENCOUNTER — Other Ambulatory Visit (HOSPITAL_COMMUNITY): Payer: Self-pay

## 2021-11-26 DIAGNOSIS — J4540 Moderate persistent asthma, uncomplicated: Secondary | ICD-10-CM

## 2021-11-26 MED ORDER — OMALIZUMAB 150 MG/ML ~~LOC~~ SOSY
PREFILLED_SYRINGE | SUBCUTANEOUS | 5 refills | Status: DC
  Filled 2021-11-26: qty 2, fill #0

## 2021-11-26 MED ORDER — OMALIZUMAB 150 MG/ML ~~LOC~~ SOSY
PREFILLED_SYRINGE | SUBCUTANEOUS | 5 refills | Status: DC
  Filled 2021-11-26: qty 2, 28d supply, fill #0

## 2021-11-26 NOTE — Telephone Encounter (Signed)
Refill sent for [facility] to [facility]: [phone]  ? ?Dose: 300 mg every 4 weeks ? ?Last OV: 10/24/21 ?Provider: Dr. Lamonte Sakai ? ?Next OV: 01/28/22 ? ?Knox Saliva, PharmD, MPH, BCPS ?Clinical Pharmacist (Rheumatology and Pulmonology)  ?

## 2021-11-27 ENCOUNTER — Other Ambulatory Visit (HOSPITAL_COMMUNITY): Payer: Self-pay

## 2021-11-28 ENCOUNTER — Other Ambulatory Visit (HOSPITAL_COMMUNITY): Payer: Self-pay

## 2021-12-04 ENCOUNTER — Other Ambulatory Visit (HOSPITAL_COMMUNITY): Payer: Self-pay

## 2021-12-05 ENCOUNTER — Telehealth: Payer: Self-pay

## 2021-12-05 ENCOUNTER — Encounter: Payer: Self-pay | Admitting: Nurse Practitioner

## 2021-12-05 ENCOUNTER — Other Ambulatory Visit: Payer: Self-pay | Admitting: Pulmonary Disease

## 2021-12-05 ENCOUNTER — Ambulatory Visit: Payer: 59 | Admitting: Nurse Practitioner

## 2021-12-05 ENCOUNTER — Ambulatory Visit (INDEPENDENT_AMBULATORY_CARE_PROVIDER_SITE_OTHER): Payer: 59

## 2021-12-05 ENCOUNTER — Other Ambulatory Visit (HOSPITAL_COMMUNITY): Payer: Self-pay

## 2021-12-05 VITALS — BP 118/82 | HR 76 | Temp 98.6°F | Ht 70.0 in | Wt 229.6 lb

## 2021-12-05 DIAGNOSIS — J4551 Severe persistent asthma with (acute) exacerbation: Secondary | ICD-10-CM | POA: Diagnosis not present

## 2021-12-05 DIAGNOSIS — J301 Allergic rhinitis due to pollen: Secondary | ICD-10-CM

## 2021-12-05 DIAGNOSIS — R0602 Shortness of breath: Secondary | ICD-10-CM | POA: Diagnosis not present

## 2021-12-05 DIAGNOSIS — J4540 Moderate persistent asthma, uncomplicated: Secondary | ICD-10-CM

## 2021-12-05 DIAGNOSIS — R059 Cough, unspecified: Secondary | ICD-10-CM | POA: Diagnosis not present

## 2021-12-05 DIAGNOSIS — J45909 Unspecified asthma, uncomplicated: Secondary | ICD-10-CM | POA: Diagnosis not present

## 2021-12-05 LAB — POCT EXHALED NITRIC OXIDE: FeNO level (ppb): 36

## 2021-12-05 MED ORDER — ALBUTEROL SULFATE (2.5 MG/3ML) 0.083% IN NEBU
2.5000 mg | INHALATION_SOLUTION | Freq: Four times a day (QID) | RESPIRATORY_TRACT | 12 refills | Status: DC | PRN
  Filled 2021-12-05: qty 75, 7d supply, fill #0
  Filled 2021-12-11: qty 75, 7d supply, fill #1
  Filled 2022-01-29: qty 90, 8d supply, fill #2
  Filled 2022-05-01: qty 90, 8d supply, fill #3
  Filled 2022-07-24: qty 90, 8d supply, fill #4

## 2021-12-05 MED ORDER — DOXYCYCLINE HYCLATE 100 MG PO TABS
100.0000 mg | ORAL_TABLET | Freq: Two times a day (BID) | ORAL | 0 refills | Status: AC
  Filled 2021-12-05: qty 14, 7d supply, fill #0

## 2021-12-05 MED ORDER — PROMETHAZINE-CODEINE 6.25-10 MG/5ML PO SYRP: 5.0000 mL | ORAL_SOLUTION | Freq: Three times a day (TID) | ORAL | 0 refills | Status: DC | PRN

## 2021-12-05 MED ORDER — PROMETHAZINE-CODEINE 6.25-10 MG/5ML PO SYRP
5.0000 mL | ORAL_SOLUTION | Freq: Three times a day (TID) | ORAL | 0 refills | Status: DC | PRN
  Filled 2021-12-05: qty 80, 6d supply, fill #0

## 2021-12-05 MED ORDER — ALBUTEROL SULFATE (2.5 MG/3ML) 0.083% IN NEBU: 2.5000 mg | INHALATION_SOLUTION | Freq: Four times a day (QID) | RESPIRATORY_TRACT | Status: DC | PRN

## 2021-12-05 MED ORDER — METHYLPREDNISOLONE ACETATE 80 MG/ML IJ SUSP
80.0000 mg | Freq: Once | INTRAMUSCULAR | Status: AC
  Administered 2021-12-05: 80 mg via INTRAMUSCULAR

## 2021-12-05 MED ORDER — METHYLPREDNISOLONE ACETATE 80 MG/ML IJ SUSP: 80.0000 mg | Freq: Once | INTRAMUSCULAR | Status: DC

## 2021-12-05 MED ORDER — BENZONATATE 200 MG PO CAPS
200.0000 mg | ORAL_CAPSULE | Freq: Three times a day (TID) | ORAL | 1 refills | Status: DC | PRN
  Filled 2021-12-05: qty 30, 10d supply, fill #0

## 2021-12-05 MED ORDER — SPIRIVA RESPIMAT 1.25 MCG/ACT IN AERS
2.0000 | INHALATION_SPRAY | Freq: Every day | RESPIRATORY_TRACT | 6 refills | Status: DC
  Filled 2021-12-05: qty 4, 30d supply, fill #0
  Filled 2022-01-06: qty 4, 30d supply, fill #1
  Filled 2022-02-04: qty 4, 30d supply, fill #2
  Filled 2022-03-27: qty 4, 30d supply, fill #3
  Filled 2022-05-01: qty 4, 30d supply, fill #4
  Filled 2022-09-02: qty 4, 30d supply, fill #5

## 2021-12-05 MED ORDER — ALBUTEROL SULFATE (2.5 MG/3ML) 0.083% IN NEBU
2.5000 mg | INHALATION_SOLUTION | Freq: Once | RESPIRATORY_TRACT | Status: AC
  Administered 2021-12-05: 2.5 mg via RESPIRATORY_TRACT

## 2021-12-05 MED ORDER — PREDNISONE 10 MG PO TABS
ORAL_TABLET | ORAL | 0 refills | Status: DC
  Filled 2021-12-05: qty 20, 8d supply, fill #0

## 2021-12-05 NOTE — Addendum Note (Signed)
Addended by: Konrad Felix L on: 12/05/2021 02:20 PM ? ? Modules accepted: Orders ? ?

## 2021-12-05 NOTE — Progress Notes (Addendum)
? ?'@Patient'$  ID: Tammy Chapman, female    DOB: 03/12/1991, 31 y.o.   MRN: [id number] ? ?Chief Complaint  ?Patient presents with  ? Follow-up  ?  Chest tightness, wheezing,productive cough  ? ? ?Referring provider: ?Janith Lima, MD ? ?HPI: ?31 year old female, never smoker followed for moderate persistent asthma on biologic therapy.  She is a patient of Dr. Agustina Caroli and last seen in office on 10/24/2021.  Past medical history significant for allergic rhinitis, GERD, PCOS. ? ?TEST/EVENTS:  ?01/25/2021 PFTs: FVC 107, FEV110, ratio 86, TLC 115, DLCO corrected for alveolar volume 107.  No significant BD; 15% change in mid flow ? ?10/24/2021: OV with Dr. Lamonte Sakai.  Mild flare in symptoms with persistent dry cough x2 weeks.  Concern for possible upper airway component as well.  Started on prednisone 20 mg for 5 days.  Advised to continue Spiriva, Symbicort, Singulair and Xolair.  IgE was significantly elevated to 583.  Eosinophils were 100. ? ?12/05/2021: Today-acute visit ?Patient presents today for worsening cough which started around 2 weeks ago.  She has also had increased wheezing and chest tightness.  Describes her cough as productive with chunky, yellow to green sputum.  She has been unable to sleep at night due to the paroxysmal cough.  Slight increase in shortness of breath with exertion and coughing spells.  Denies any hemoptysis, fevers, chills, sinus symptoms.  She is currently taking Robitussin with minimal relief in symptoms.  Feels like her ribs are starting to hurt with deep breaths from all the coughing.  She continues on Symbicort and Spiriva.  Has had to use her albuterol more over the last couple days.  Is due for her next Xolair injection on 4/23.  Takes Singulair nightly.  She has had a few different flares requiring prednisone despite being on Xolair. ? ?FeNO 36 ppb ? ?Allergies  ?Allergen Reactions  ? Benadryl [Diphenhydramine Hcl] Shortness Of Breath  ?  Pt states that she feels fine if she sits down, but  if she is moving around after taking benadryl, she feels SOB  ? Penicillins Shortness Of Breath  ?  Has patient had a PCN reaction causing immediate rash, facial/tongue/throat swelling, SOB or lightheadedness with hypotension: yes ?Has patient had a PCN reaction causing severe rash involving mucus membranes or skin necrosis: no ?Has patient had a PCN reaction that required hospitalization: no ?Has patient had a PCN reaction occurring within the last 10 years: no ?If all of the above answers are "NO", then may proceed with Cephalosporin use. ?Patient reports she CAN TAKE kEFLEX AND SUPRAX  ? Adhesive [Tape] Hives  ? Percocet [Oxycodone-Acetaminophen]   ? Lactose Intolerance (Gi) Diarrhea and Nausea And Vomiting  ? Latex Rash  ? Omnipaque [Iohexol] Itching  ?  Slight itching in mouth and tongue. No swelling, wheezing, or hives.  ? Sulfa Antibiotics Rash  ? Ultram [Tramadol Hcl] Rash  ? ? ?Immunization History  ?Administered Date(s) Administered  ? Hepatitis B 01/02/2012  ? Influenza,inj,Quad PF,6+ Mos 05/07/2019  ? Influenza-Unspecified 05/07/2019, 05/21/2020, 05/08/2021  ? PNEUMOCOCCAL CONJUGATE-20 02/27/2021  ? Pneumococcal Polysaccharide-23 07/03/2019  ? Tdap 04/13/2019  ? ? ?Past Medical History:  ?Diagnosis Date  ? Allergy   ? Asthma   ? Bronchitis, mucopurulent recurrent ([place]) 01/19/2019  ? Chronic asthma 01/19/2019  ? Loose body of right knee 02/2013  ? Pregnancy induced hypertension   ? Recurrent otitis media of both ears 01/19/2019  ? Sprain and strain of medial collateral ligament of knee 02/2013  ?  right  ? ? ?Tobacco History: ?Social History  ? ?Tobacco Use  ?Smoking Status Never  ?Smokeless Tobacco Never  ? ?Counseling given: Not Answered ? ? ?Outpatient Medications Prior to Visit  ?Medication Sig Dispense Refill  ? albuterol (VENTOLIN HFA) 108 (90 Base) MCG/ACT inhaler Inhale 2 puffs into the lungs every 6 (six) hours as needed for wheezing or shortness of breath. 18 g 2  ? aspirin EC 81 MG tablet Take  81 mg by mouth daily. Swallow whole.    ? Azelaic Acid (FINACEA) 15 % gel Apply topically to face up to twice daily, 50 g PRN  ? budesonide-formoterol (SYMBICORT) 160-4.5 MCG/ACT inhaler INHALE 2 PUFFS BY MOUTH 2 TIMES DAILY 10.2 g 11  ? cyclobenzaprine (FLEXERIL) 5 MG tablet Take 1 tablet (5 mg total) by mouth 2 (two) times daily as needed for muscle spasms. 30 tablet 0  ? levocetirizine (XYZAL) 5 MG tablet TAKE 1 TABLET BY MOUTH EVERY EVENING 30 tablet 10  ? metFORMIN (GLUCOPHAGE XR) 750 MG 24 hr tablet Take 1 tablet by mouth daily with breakfast. 90 tablet 1  ? metroNIDAZOLE (METROCREAM) 0.75 % cream Apply topically to face up to 2 times a day 45 g 12  ? montelukast (SINGULAIR) 10 MG tablet TAKE 1 TABLET BY MOUTH AT BEDTIME 30 tablet 10  ? omalizumab (XOLAIR) 150 MG/ML prefilled syringe INJECT 300 MG INTO THE SKIN EVERY 28 DAYS. 2 mL 5  ? PHEXXI 1.8-1-0.4 % GEL PLACE 1 APPLICATORFUL VAGINALLY AS NEEDED. IMMEDIATELY BEFORE OR UP TO 1 HOUR BEFORE SEX 60 g 11  ? sucralfate (CARAFATE) 1 g tablet Take 1 tablet (1 g total) by mouth 3 (three) times daily. Dissolve tablet in 30cc water to make a slurry 42 tablet 1  ? Tiotropium Bromide Monohydrate (SPIRIVA RESPIMAT) 1.25 MCG/ACT AERS Spiriva Respimat 1.25 mcg/actuation solution for inhalation    ? dexlansoprazole (DEXILANT) 60 MG capsule Take 1 capsule (60 mg total) by mouth daily. 90 capsule 1  ? hyoscyamine (LEVBID) 0.375 MG 12 hr tablet TAKE 1 TABLET BY MOUTH 2 TIMES DAILY 60 tablet 1  ? medroxyPROGESTERone (PROVERA) 10 MG tablet TAKE 1 TABLET BY MOUTH ONCE A DAY FOR 2 WEEKS, REPEAT AS DIRECTED 14 tablet 3  ? Tiotropium Bromide Monohydrate 1.25 MCG/ACT AERS INHALE 2 PUFFS INTO THE LUNGS DAILY. 4 g 12  ? ?No facility-administered medications prior to visit.  ? ? ? ?Review of Systems:  ? ?Constitutional: No weight loss or gain, night sweats, fevers, chills +fatigue (unable to sleep at night due to cough.) ?HEENT: No headaches, difficulty swallowing, tooth/dental  problems, or sore throat. No sneezing, itching, ear ache, nasal congestion, or post nasal drip ?CV:  +PND. No chest pain, orthopnea, swelling in lower extremities, anasarca, dizziness, palpitations, syncope ?Resp: +shortness of breath with exertion; productive, paroxysmal cough; wheezing; chest tightness. No hemoptysis.  No chest wall deformity ?GI:  No heartburn, indigestion, abdominal pain, nausea, vomiting, diarrhea, change in bowel habits, loss of appetite, bloody stools.  ?GU: No dysuria, change in color of urine, urgency or frequency.  No flank pain, no hematuria  ?Skin: No rash, lesions, ulcerations ?MSK:  No joint pain or swelling.  No decreased range of motion.  No back pain. ?Neuro: No dizziness or lightheadedness.  ?Psych: No depression or anxiety. Mood stable.  ? ? ? ?Physical Exam: ? ?BP 118/82 (BP Location: Right Arm, Cuff Size: Normal)   Pulse 76   Temp 98.6 ?F (37 ?C) (Oral)   Ht '5\' 10"'$  (1.778  m)   Wt 229 lb 9.6 oz (104.1 kg)   SpO2 99%   BMI 32.94 kg/m?  ? ?GEN: Pleasant, interactive, well-nourished; in no acute distress. ?HEENT:  Normocephalic and atraumatic. PERRLA. Sclera white. Nasal turbinates pink, moist and patent bilaterally. No rhinorrhea present. Oropharynx erythematous and moist, without exudate or edema. No lesions, ulcerations, or postnasal drip.  ?NECK:  Supple w/ fair ROM. No JVD present. Normal carotid impulses w/o bruits. Thyroid symmetrical with no goiter or nodules palpated. No lymphadenopathy.   ?CV: RRR, no m/r/g, no peripheral edema. Pulses intact, +2 bilaterally. No cyanosis, pallor or clubbing. ?PULMONARY:  Unlabored, regular breathing. Scattered expiratory wheezes bilaterally A&P. Paroxysmal cough. No accessory muscle use. No dullness to percussion. ?GI: BS present and normoactive. Soft, non-tender to palpation. No organomegaly or masses detected. No CVA tenderness. ?MSK: No erythema, warmth or tenderness. Cap refil <2 sec all extrem. No deformities or joint swelling  noted.  ?Neuro: A/Ox3. No focal deficits noted.   ?Skin: Warm, no lesions or rashe ?Psych: Normal affect and behavior. Judgement and thought content appropriate.  ? ? ? ?Lab Results: ? ?CBC ?   ?Component Value Date/

## 2021-12-05 NOTE — Assessment & Plan Note (Signed)
Well-controlled per patient.  No acute flare in symptoms.  Continue Xyzal and Singulair for trigger prevention. ?

## 2021-12-05 NOTE — Addendum Note (Signed)
Addended by: Clayton Bibles on: 12/05/2021 02:25 PM ? ? Modules accepted: Orders ? ?

## 2021-12-05 NOTE — Patient Instructions (Addendum)
Continue Symbicort 2 puffs Twice daily. Brush tongue and rinse mouth afterwards ?Continue Spiriva 2 puffs daily  ?Continue Albuterol inhaler 2 puffs or 3 mL every 6 hours as needed for shortness of breath or wheezing. Notify if symptoms persist despite rescue inhaler/neb use. ?Continue Xyzal 5 mg daily ?Continue singulair 10 mg daily ?Continue Xolair injections every 28 days as previously scheduled. I'll talk to Tammy Chapman about switching your biologic therapy. ? ?Prednisone taper. 4 tabs for 2 days, then 3 tabs for 2 days, 2 tabs for 2 days, then 1 tab for 2 days, then stop. Take in AM with food. Start tomorrow.  ?Doxycycline 100 mg Twice daily for 7 days. Take with food. Avoid direct sun exposure while taking; wear sunscreen when outside ?Tessalon Perles (benzonatate) 1 capsule Three times a day as needed for cough ?Phenergan with codeine syrup 5 mL every 6 hours as needed for cough ? ?Chest x ray today. We will notify you of any abnormal results. ? ?Follow up in 2 weeks with Tammy Chapman or Tammy Chapman. If symptoms do not improve or worsen, please contact office for sooner follow up or seek emergency care. ?

## 2021-12-05 NOTE — Assessment & Plan Note (Addendum)
Acute allergic asthma exacerbation with elevated exhaled nitric oxide.  Has required prednisone 3 or 4 different times over the last 6 months, despite Xolair injections.  Believe she would benefit from step up to [facility].  Will send a message to pharmacy to start this process.  Depo 80 mg injection and albuterol neb today.  Improvement in symptoms after neb treatment.  Treat with prednisone taper and doxycycline course.  Initiate cough control measures as well.  Continue triple therapy regimen.  Order sent for nebulizer machine and solution.  CXR to rule out superimposed infection. ? ?Patient Instructions  ?Continue Symbicort 2 puffs Twice daily. Brush tongue and rinse mouth afterwards ?Continue Spiriva 2 puffs daily  ?Continue Albuterol inhaler 2 puffs or 3 mL every 6 hours as needed for shortness of breath or wheezing. Notify if symptoms persist despite rescue inhaler/neb use. ?Continue Xyzal 5 mg daily ?Continue singulair 10 mg daily ?Continue Xolair injections every 28 days as previously scheduled. I'll talk to Dr. Lamonte Sakai about switching your biologic therapy. ? ?Prednisone taper. 4 tabs for 2 days, then 3 tabs for 2 days, 2 tabs for 2 days, then 1 tab for 2 days, then stop. Take in AM with food. Start tomorrow.  ?Doxycycline 100 mg Twice daily for 7 days. Take with food. Avoid direct sun exposure while taking; wear sunscreen when outside ?Tessalon Perles (benzonatate) 1 capsule Three times a day as needed for cough ?Phenergan with codeine syrup 5 mL every 6 hours as needed for cough ? ?Chest x ray today. We will notify you of any abnormal results. ? ?Follow up in 2 weeks with Dr. Lamonte Sakai or Alanson Aly. If symptoms do not improve or worsen, please contact office for sooner follow up or seek emergency care. ? ? ?

## 2021-12-06 ENCOUNTER — Other Ambulatory Visit (HOSPITAL_COMMUNITY): Payer: Self-pay

## 2021-12-11 ENCOUNTER — Other Ambulatory Visit (HOSPITAL_COMMUNITY): Payer: Self-pay

## 2021-12-16 ENCOUNTER — Other Ambulatory Visit (HOSPITAL_COMMUNITY): Payer: Self-pay

## 2021-12-18 ENCOUNTER — Telehealth: Payer: 59 | Admitting: Physician Assistant

## 2021-12-18 DIAGNOSIS — S90466A Insect bite (nonvenomous), unspecified lesser toe(s), initial encounter: Secondary | ICD-10-CM

## 2021-12-18 DIAGNOSIS — W57XXXA Bitten or stung by nonvenomous insect and other nonvenomous arthropods, initial encounter: Secondary | ICD-10-CM | POA: Diagnosis not present

## 2021-12-18 MED ORDER — PREDNISONE 20 MG PO TABS
20.0000 mg | ORAL_TABLET | Freq: Every day | ORAL | 0 refills | Status: DC
  Filled 2021-12-18: qty 5, 5d supply, fill #0

## 2021-12-18 NOTE — Progress Notes (Signed)
E-Visit for Insect Sting ? ?Thank you for describing the insect sting for [place].  Here is how we plan to help! ? ?Based on the information you have shared with me it looks like you have: ?A bite that we will treat with a short dose of prednisone ? ?The 2 greatest risks from insect stings are allergic reaction, which can be fatal in some people and infection, which is more common and less serious. ? ?Bees, wasps, yellow jackets, and hornets belong to a class of insects called Hymenoptera.  Most insect stings cause only minor discomfort.  Stings can happen anywhere on the body and can be painful.  Most stings are from honey bees or yellow jackets.  Fire ants can sting multiple times.  The sites of the stings are more likely to become infected.   ? ?Based on your information I have:, Provided a home care guide for insect stings and instructions on when to call for help., and I have sent in prednisone 20 mg by mouth daily for 5 days to the pharmacy you selected.  Please make sure that you selected a pharmacy that is open now. ? ?What can be used to prevent Insect Stings? ? ?Insect repellant with at least 20% DEET. ? ?Wearing long pants and shirts with socks and shoes. ? ?Wear dark or drab-colored clothes rather than bright colors. ? ?Avoid using perfumes and hair sprays; these attract insects. ? ?HOME CARE ADVICE: ? ?1. Stinger removal: ?The stinger looks like a tiny black dot in the sting. ?Use a fingernail, credit card edge, or knife-edge to scrape it off.  Don't pull it out because it squeezes out more venom. ?If the stinger is below the skin surface, leave it alone.  It will be shed with normal skin healing. ?2. Use cold compresses to the area of the sting for 10-20 minutes.  You may repeat this as needed to relieve symptoms of pain and swelling. ?3.  For pain relief, take acetominophen 650 mg 4-6 hours as needed or ibuprofen 400 mg every 6-8 hours as needed or naproxen 250-500 mg every 12 hours as needed. ?4.  You can  also use hydrocortisone cream 0.5% or 1% up to 4 times daily as needed for itching. ?5.  If the sting becomes very itchy, take Benadryl 25-50 mg, follow directions on box. ?6.  Wash the area 2-3 times daily with antibacterial soap and warm water. ?7. Call your Doctor if: ?Fever, a severe headache, or rash occur in the next 2 weeks. ?Sting area begins to look infected. ?Redness and swelling worsens after home treatment. ?Your current symptoms become worse. ? ? ? ?MAKE SURE YOU: ? ?Understand these instructions. ?Will watch your condition. ?Will get help right away if you are not doing well or get worse. ? ?Thank you for choosing an e-visit. ? ?Your e-visit answers were reviewed by a board certified advanced clinical practitioner to complete your personal care plan. Depending upon the condition, your plan could have included both over the counter or prescription medications. ? ?Please review your pharmacy choice. Make sure the pharmacy is open so you can pick up prescription now. If there is a problem, you may contact your provider through [employer] and have the prescription routed to another pharmacy.  Your safety is important to [place]. If you have drug allergies check your prescription carefully.  ? ?For the next 24 hours you can use MyChart to ask questions about today's visit, request a non-urgent call back, or ask  for a work or school excuse. ?You will get an email in the next two days asking about your experience. I hope that your e-visit has been valuable and will speed your recovery. ? ? ?I provided 5 minutes of non face-to-face time during this encounter for chart review and documentation.  ? ?

## 2021-12-19 ENCOUNTER — Encounter: Payer: Self-pay | Admitting: Nurse Practitioner

## 2021-12-19 ENCOUNTER — Other Ambulatory Visit (HOSPITAL_COMMUNITY): Payer: Self-pay

## 2021-12-19 ENCOUNTER — Ambulatory Visit: Payer: 59 | Admitting: Nurse Practitioner

## 2021-12-19 ENCOUNTER — Telehealth: Payer: Self-pay

## 2021-12-19 VITALS — BP 120/78 | HR 91 | Temp 98.2°F | Ht 70.0 in | Wt 237.0 lb

## 2021-12-19 DIAGNOSIS — J301 Allergic rhinitis due to pollen: Secondary | ICD-10-CM | POA: Diagnosis not present

## 2021-12-19 DIAGNOSIS — J4551 Severe persistent asthma with (acute) exacerbation: Secondary | ICD-10-CM | POA: Diagnosis not present

## 2021-12-19 MED ORDER — PREDNISONE 10 MG PO TABS
10.0000 mg | ORAL_TABLET | Freq: Every day | ORAL | 0 refills | Status: DC
  Filled 2021-12-19 – 2022-02-04 (×2): qty 30, 30d supply, fill #0

## 2021-12-19 MED ORDER — HYDROCOD POLI-CHLORPHE POLI ER 10-8 MG/5ML PO SUER
5.0000 mL | Freq: Two times a day (BID) | ORAL | 0 refills | Status: DC | PRN
  Filled 2021-12-19: qty 70, 7d supply, fill #0

## 2021-12-19 MED ORDER — PREDNISONE 10 MG PO TABS
ORAL_TABLET | ORAL | 0 refills | Status: DC
Start: 2021-12-19 — End: 2022-06-13
  Filled 2021-12-19: qty 30, 12d supply, fill #0

## 2021-12-19 MED ORDER — METHYLPREDNISOLONE ACETATE 80 MG/ML IJ SUSP
80.0000 mg | Freq: Once | INTRAMUSCULAR | Status: AC
  Administered 2021-12-19: 80 mg via INTRAMUSCULAR

## 2021-12-19 NOTE — Telephone Encounter (Signed)
Paperwork complete. Thanks!

## 2021-12-19 NOTE — Patient Instructions (Addendum)
Continue Symbicort 2 puffs Twice daily. Brush tongue and rinse mouth afterwards ?Continue Spiriva 2 puffs daily  ?Continue Albuterol inhaler 2 puffs or 3 mL every 6 hours as needed for shortness of breath or wheezing. Notify if symptoms persist despite rescue inhaler/neb use. ?Continue Xyzal 5 mg daily ?Continue singulair 10 mg daily ? ?Prednisone taper. 4 tabs for 3 days, then 3 tabs for 3 days, 2 tabs for 3 days, then start 1 tab daily until we see you back.  ?Tessalon Perles (benzonatate) 1 capsule Three times a day as needed for cough ?Tussionex 5 mL Twice daily as needed for cough. Use with caution - may cause drowsiness. Do not drive if drowsy ? ?Start Tezspire once contacted by pharmacy team  ?  ?Follow up in 2 weeks with Dr. Lamonte Sakai or Alanson Aly. If symptoms do not improve or worsen, please contact office for sooner follow up or seek emergency care. ?

## 2021-12-19 NOTE — Telephone Encounter (Signed)
Received notification to begin working on Tezspire BIV for pt. New start paperwork that was received was incomplete, as well as on an outdated form. ? ?Drafted new application, sent to pt and completed via [employer]. Provider portion placed in [place] box for signature on 12/10/21, we are still awaiting it's return. ?

## 2021-12-19 NOTE — Assessment & Plan Note (Signed)
Persistent acute allergic asthma exacerbation with upper airway component. Worsening symptoms since coming off prednisone. Has required prednisone 4 or 5 different times over the last 6 months, despite Xolair injections.  Awaiting prior auth approval for Tezspire. Depo 80 mg injection. Treat with extended prednisone taper then initiate daily 10 mg until we can get her started on Tezspire. Will try to wean if symptoms controlled after initiation of therapy. Step up cough control measures.  Continue triple therapy regimen.   ? ?Patient Instructions  ?Continue Symbicort 2 puffs Twice daily. Brush tongue and rinse mouth afterwards ?Continue Spiriva 2 puffs daily  ?Continue Albuterol inhaler 2 puffs or 3 mL every 6 hours as needed for shortness of breath or wheezing. Notify if symptoms persist despite rescue inhaler/neb use. ?Continue Xyzal 5 mg daily ?Continue singulair 10 mg daily ? ?Prednisone taper. 4 tabs for 3 days, then 3 tabs for 3 days, 2 tabs for 3 days, then start 1 tab daily until we see you back.  ?Tessalon Perles (benzonatate) 1 capsule Three times a day as needed for cough ?Tussionex 5 mL Twice daily as needed for cough. Use with caution - may cause drowsiness. Do not drive if drowsy ? ?Start Tezspire once contacted by pharmacy team  ?  ?Follow up in 2 weeks with Dr. Lamonte Sakai or Alanson Aly. If symptoms do not improve or worsen, please contact office for sooner follow up or seek emergency care. ? ? ?

## 2021-12-19 NOTE — Telephone Encounter (Signed)
I have not received anything and my nurse today said she checked my box today. She is going to double check but otherwise, can you reprint and put back in the box? Thanks!

## 2021-12-19 NOTE — Progress Notes (Signed)
? ?'@Patient'$  ID: Tammy Chapman, female    DOB: 03/16/1991, 31 y.o.   MRN: [id number] ? ?Chief Complaint  ?Patient presents with  ? Follow-up  ?  Pt states she hasnt felt any better since last visit. She is currenly having a deep cough that is sometimes productive. Pt is on Spiriva, Symbicort daily and albuterol. And no longer doing the Xolair shots waiting to hear about the Trezspire.   ? ? ?Referring provider: ?Janith Lima, MD ? ?HPI: ?31 year old female, never smoker followed for moderate persistent asthma on biologic therapy.  She is a patient of Dr. Agustina Caroli and last seen in office on 10/24/2021.  Past medical history significant for allergic rhinitis, GERD, PCOS. ? ?TEST/EVENTS:  ?01/25/2021 PFTs: FVC 107, FEV110, ratio 86, TLC 115, DLCO corrected for alveolar volume 107.  No significant BD; 15% change in mid flow ? ?10/24/2021: OV with Dr. Lamonte Sakai.  Mild flare in symptoms with persistent dry cough x2 weeks.  Concern for possible upper airway component as well.  Started on prednisone 20 mg for 5 days.  Advised to continue Spiriva, Symbicort, Singulair and Xolair.  IgE was significantly elevated to 583.  Eosinophils were 100. ? ?12/05/2021: OV with Keandra Medero NP for worsening cough which started around 2 weeks ago.  She has also had increased wheezing and chest tightness.  Describes her cough as productive with chunky, yellow to green sputum.  She has been unable to sleep at night due to the paroxysmal cough.  Slight increase in shortness of breath with exertion and coughing spells.  She continues on Symbicort and Spiriva.  Has had to use her albuterol more over the last couple days.  Is due for her next Xolair injection on 4/23.  Takes Singulair nightly.  She has had a few different flares requiring prednisone despite being on Xolair.  Shared decision making to step up to Tezspire -process started with pharmacy team.  Treated with Depo injection, prednisone taper and doxycycline course.  Provided with Phenergan with codeine  syrup and Tessalon Perles for improved cough control. FeNO 36 ppb.  CXR was without superimposed infection. ? ?12/19/2021: Today-follow-up ?Patient presents today for intended follow-up; however since being off prednisone her symptoms have returned and her cough feels as though it is worse than it was before.  Cough is mostly dry at this point.  Does feel like doxycycline helped some with sputum production.  She has been unable to sleep at night and it has made it difficult for her to perform at her job.  Continues to have some increased shortness of breath primarily with coughing spells, and wheezing.  Denies any fever, hemoptysis, lower extremity edema, nasal drainage or postnasal drip.  She continues on Symbicort and Spiriva.  Has tried all of the cough control measures previously prescribed with little relief.  She is awaiting approval to start Tezspire. ? ?Allergies  ?Allergen Reactions  ? Benadryl [Diphenhydramine Hcl] Shortness Of Breath  ?  Pt states that she feels fine if she sits down, but if she is moving around after taking benadryl, she feels SOB  ? Penicillins Shortness Of Breath  ?  Has patient had a PCN reaction causing immediate rash, facial/tongue/throat swelling, SOB or lightheadedness with hypotension: yes ?Has patient had a PCN reaction causing severe rash involving mucus membranes or skin necrosis: no ?Has patient had a PCN reaction that required hospitalization: no ?Has patient had a PCN reaction occurring within the last 10 years: no ?If all of the above answers are "  NO", then may proceed with Cephalosporin use. ?Patient reports she CAN TAKE kEFLEX AND SUPRAX  ? Adhesive [Tape] Hives  ? Percocet [Oxycodone-Acetaminophen]   ? Lactose Intolerance (Gi) Diarrhea and Nausea And Vomiting  ? Latex Rash  ? Omnipaque [Iohexol] Itching  ?  Slight itching in mouth and tongue. No swelling, wheezing, or hives.  ? Sulfa Antibiotics Rash  ? Ultram [Tramadol Hcl] Rash  ? ? ?Immunization History  ?Administered  Date(s) Administered  ? Hepatitis B 01/02/2012  ? Influenza,inj,Quad PF,6+ Mos 05/07/2019  ? Influenza-Unspecified 05/07/2019, 05/21/2020, 05/08/2021  ? PNEUMOCOCCAL CONJUGATE-20 02/27/2021  ? Pneumococcal Polysaccharide-23 07/03/2019  ? Tdap 04/13/2019  ? ? ?Past Medical History:  ?Diagnosis Date  ? Allergy   ? Asthma   ? Bronchitis, mucopurulent recurrent ([place]) 01/19/2019  ? Chronic asthma 01/19/2019  ? Loose body of right knee 02/2013  ? Pregnancy induced hypertension   ? Recurrent otitis media of both ears 01/19/2019  ? Sprain and strain of medial collateral ligament of knee 02/2013  ? right  ? ? ?Tobacco History: ?Social History  ? ?Tobacco Use  ?Smoking Status Never  ?Smokeless Tobacco Never  ? ?Counseling given: Not Answered ? ? ?Outpatient Medications Prior to Visit  ?Medication Sig Dispense Refill  ? albuterol (PROVENTIL) (2.5 MG/3ML) 0.083% nebulizer solution Inhale 1 vial (2.5 mg total) by nebulization every 6 (six) hours as needed for wheezing or shortness of breath. 75 mL 12  ? albuterol (VENTOLIN HFA) 108 (90 Base) MCG/ACT inhaler Inhale 2 puffs into the lungs every 6 (six) hours as needed for wheezing or shortness of breath. 18 g 2  ? aspirin EC 81 MG tablet Take 81 mg by mouth daily. Swallow whole.    ? Azelaic Acid (FINACEA) 15 % gel Apply topically to face up to twice daily, 50 g PRN  ? benzonatate (TESSALON) 200 MG capsule Take 1 capsule (200 mg total) by mouth 3 (three) times daily as needed. 30 capsule 1  ? budesonide-formoterol (SYMBICORT) 160-4.5 MCG/ACT inhaler INHALE 2 PUFFS BY MOUTH 2 TIMES DAILY 10.2 g 11  ? levocetirizine (XYZAL) 5 MG tablet TAKE 1 TABLET BY MOUTH EVERY EVENING 30 tablet 10  ? metFORMIN (GLUCOPHAGE XR) 750 MG 24 hr tablet Take 1 tablet by mouth daily with breakfast. 90 tablet 1  ? metroNIDAZOLE (METROCREAM) 0.75 % cream Apply topically to face up to 2 times a day 45 g 12  ? montelukast (SINGULAIR) 10 MG tablet TAKE 1 TABLET BY MOUTH AT BEDTIME 30 tablet 10  ? omalizumab  (XOLAIR) 150 MG/ML prefilled syringe INJECT 300 MG INTO THE SKIN EVERY 28 DAYS. 2 mL 5  ? PHEXXI 1.8-1-0.4 % GEL PLACE 1 APPLICATORFUL VAGINALLY AS NEEDED. IMMEDIATELY BEFORE OR UP TO 1 HOUR BEFORE SEX 60 g 11  ? Tiotropium Bromide Monohydrate (SPIRIVA RESPIMAT) 1.25 MCG/ACT AERS Inhale 2 puffs into the lungs daily. 4 g 6  ? predniSONE (DELTASONE) 20 MG tablet Take 1 tablet by mouth daily with breakfast. 5 tablet 0  ? hyoscyamine (LEVBID) 0.375 MG 12 hr tablet TAKE 1 TABLET BY MOUTH 2 TIMES DAILY 60 tablet 1  ? medroxyPROGESTERone (PROVERA) 10 MG tablet TAKE 1 TABLET BY MOUTH ONCE A DAY FOR 2 WEEKS, REPEAT AS DIRECTED 14 tablet 3  ? cyclobenzaprine (FLEXERIL) 5 MG tablet Take 1 tablet (5 mg total) by mouth 2 (two) times daily as needed for muscle spasms. 30 tablet 0  ? sucralfate (CARAFATE) 1 g tablet Take 1 tablet (1 g total) by mouth 3 (  three) times daily. Dissolve tablet in 30cc water to make a slurry 42 tablet 1  ? ?No facility-administered medications prior to visit.  ? ? ? ?Review of Systems:  ? ?Constitutional: No weight loss or gain, night sweats, fevers, chills +fatigue (unable to sleep at night due to cough.) ?HEENT: No headaches, difficulty swallowing, tooth/dental problems, or sore throat. No sneezing, itching, ear ache, nasal congestion, or post nasal drip ?CV:  +PND. No chest pain, orthopnea, swelling in lower extremities, anasarca, dizziness, palpitations, syncope ?Resp: +shortness of breath with exertion; bronchitic, paroxysmal cough; wheezing; chest tightness. No hemoptysis.  No chest wall deformity ?GI:  No heartburn, indigestion, abdominal pain, nausea, vomiting, diarrhea, change in bowel habits, loss of appetite, bloody stools.  ?GU: No dysuria, change in color of urine, urgency or frequency.  No flank pain, no hematuria  ?Skin: No rash, lesions, ulcerations ?MSK:  No joint pain or swelling.  No decreased range of motion.  No back pain. ?Neuro: No dizziness or lightheadedness.  ?Psych: No  depression or anxiety. Mood stable.  ? ? ? ?Physical Exam: ? ?BP 120/78 (BP Location: Left Arm, Patient Position: Sitting, Cuff Size: Normal)   Pulse 91   Temp 98.2 ?F (36.8 ?C) (Oral)   Ht '5\' 10"'$  (1.778 m)

## 2021-12-19 NOTE — Assessment & Plan Note (Signed)
Continue trigger prevention with Xyzal and Singulair. ?

## 2021-12-20 ENCOUNTER — Other Ambulatory Visit (HOSPITAL_COMMUNITY): Payer: Self-pay

## 2021-12-20 NOTE — Telephone Encounter (Signed)
Submitted a Prior Authorization request to [facility] for TEZSPIRE via CoverMyMeds. Will update once we receive a response. ? ? ?Key: [id number] ?

## 2021-12-24 ENCOUNTER — Other Ambulatory Visit (HOSPITAL_COMMUNITY): Payer: Self-pay

## 2021-12-24 NOTE — Telephone Encounter (Signed)
Received notification from [facility] regarding a prior authorization for TEZSPIRE autoinjector. Authorization has been APPROVED from 12/24/21 to 04/25/22.  ? ?Per test claim, copay for 28 days supply is $250. ? ?Patient must fill through [place]: [phone]  ? ?Authorization # [id number] ? ?Tezspire Together enrollment forms faxed today to get patient into copay card program. ? ?Fax: [phone] ?Phone: [phone] ? ?Email sent to [facility] ordering team to see if patient will be fill w [facility] or need override to be placed for her to fill elsewhere ? ?Knox Saliva, PharmD, MPH, BCPS, CPP ?Clinical Pharmacist (Rheumatology and Pulmonology) ?

## 2021-12-24 NOTE — Telephone Encounter (Signed)
Submitted Patient Assistance Application to  [place]  for TEZSPIRE along with provider portion, PA and income documents. Will update patient when we receive a response. ? ?Fax# [phone] ?Phone# [phone] ?

## 2021-12-25 ENCOUNTER — Other Ambulatory Visit (HOSPITAL_COMMUNITY): Payer: Self-pay

## 2021-12-25 MED ORDER — TEZSPIRE 210 MG/1.91ML ~~LOC~~ SOAJ
210.0000 mg | SUBCUTANEOUS | 0 refills | Status: DC
  Filled 2021-12-25: qty 1.91, fill #0

## 2021-12-25 NOTE — Telephone Encounter (Signed)
Received fax from [employer] stating patient is enrolled into [employer] program despite being approved. Received another fax stating that patient's rx was triaged to [facility] (despite patient being locked into [facility] as [employer] Employee). Went back and forth with rep as liason  w case Freight forwarder multiple times. Case manager will not triage rx to [facility] as this was not selected by our office. They are unable to change pharmacy as this is the information they received directly from company. Rep also sates that the fax we received in regards to fast start program automatically generates (supervisors are aware), and patient is not enrolled in fast start program. ? ?Phone: [phone] ? ?Per [facility] Together portal, patient enrolled in copay card: ?Group: [id number] ?PCN: [id number] ?BIN: [id number] ?ID: [id number]  ? ?Rx sent to [facility] to have couriered to clinic prior to new start visit which is not yet scheduled. Would like to ensure that [facility] can fill medication before starting her to avoid issues in future.  ATC patient to review that [facility] may reach out if there is a copay. Left VM for patient requesting return call to pharmacy team ? ?Knox Saliva, PharmD, MPH, BCPS, CPP ?Clinical Pharmacist (Rheumatology and Pulmonology) ?

## 2021-12-27 ENCOUNTER — Ambulatory Visit: Payer: 59 | Attending: Internal Medicine | Admitting: Pharmacist

## 2021-12-27 ENCOUNTER — Other Ambulatory Visit (HOSPITAL_COMMUNITY): Payer: Self-pay

## 2021-12-27 DIAGNOSIS — Z7189 Other specified counseling: Secondary | ICD-10-CM

## 2021-12-27 MED ORDER — TEZSPIRE 210 MG/1.91ML ~~LOC~~ SOAJ
210.0000 mg | SUBCUTANEOUS | 0 refills | Status: DC
  Filled 2021-12-27 (×2): qty 1.91, 28d supply, fill #0

## 2021-12-27 NOTE — Progress Notes (Signed)
? ?  S: ?Patient presents today for review of their specialty medication.  ?  ?Patient is about to start taking Tezspire for asthma. Patient is managed by NP Marland Kitchen for this.  ?  ?Dosing: 210 mg subq q28days ?  ?Adherence: has not started .  ?  ?Efficacy: has not started  ? ?Monitoring:  ?S/Sx of hypersensitivity: none ?PFTs: monitored by Pulmonology ?S/sx of infection: none  ?  ?Current adverse effects: none reported ? ?O: ?   ? ?Lab Results  ?Component Value Date  ? WBC 7.6 10/24/2021  ? HGB 13.7 10/24/2021  ? HCT 41.0 10/24/2021  ? MCV 83.7 10/24/2021  ? PLT 270.0 10/24/2021  ? ? ?  Chemistry   ?   ?Component Value Date/Time  ? NA 136 09/25/2020 1514  ? NA 137 02/04/2019 0959  ? K 3.7 09/25/2020 1514  ? CL 101 09/25/2020 1514  ? CO2 29 09/25/2020 1514  ? BUN 9 09/25/2020 1514  ? BUN 10 02/04/2019 0959  ? CREATININE 0.75 09/25/2020 1514  ? CREATININE 0.54 01/19/2019 1012  ?    ?Component Value Date/Time  ? CALCIUM 9.4 09/25/2020 1514  ? ALKPHOS 86 09/25/2020 1514  ? AST 8 09/25/2020 1514  ? ALT 12 09/25/2020 1514  ? BILITOT 0.6 09/25/2020 1514  ? BILITOT 0.6 02/04/2019 0959  ?  ? ? ? ?A/P: ?1. Medication review: patient about to start Tezspire for asthma. I reviewed the medication with her. She has no questions or concerns at this time. No recommendations for any changes at this time. ? ?Benard Halsted, PharmD, BCACP, CPP ?Clinical Pharmacist ?[place] ?[phone] ? ? ? ? ? ?

## 2021-12-27 NOTE — Telephone Encounter (Signed)
Delivery instructions have been updated in [facility], medication will be couriered to [facility] by 12/31/21 (or once it has been successfully ordered). ? ?Rx has been processed in [facility] and copay card/PAP info has been provided to the pharmacy. The patient has no copay at this time. ?

## 2021-12-30 ENCOUNTER — Other Ambulatory Visit (HOSPITAL_COMMUNITY): Payer: Self-pay

## 2021-12-31 NOTE — Telephone Encounter (Signed)
Tammy Chapman received from [name]. Placed in [place] refrigerator. Patient states that her last Xolair injection was on 12/08/21. She can start Tammy Chapman next week ? ?Scheduled for Tammy Chapman new start on 01/06/22. Requested she come to clinic around 3:30p prior to her f/u appt with Marland Kitchen, NP at 4p. ? ?Knox Saliva, PharmD, MPH, BCPS, CPP ?Clinical Pharmacist (Rheumatology and Pulmonology) ?

## 2022-01-02 ENCOUNTER — Other Ambulatory Visit (HOSPITAL_COMMUNITY): Payer: Self-pay

## 2022-01-06 ENCOUNTER — Ambulatory Visit: Payer: 59 | Admitting: Nurse Practitioner

## 2022-01-06 ENCOUNTER — Other Ambulatory Visit (HOSPITAL_COMMUNITY): Payer: Self-pay

## 2022-01-06 ENCOUNTER — Ambulatory Visit: Payer: 59 | Admitting: Pharmacist

## 2022-01-06 DIAGNOSIS — Z7189 Other specified counseling: Secondary | ICD-10-CM

## 2022-01-06 DIAGNOSIS — J4551 Severe persistent asthma with (acute) exacerbation: Secondary | ICD-10-CM

## 2022-01-06 MED ORDER — TEZSPIRE 210 MG/1.91ML ~~LOC~~ SOAJ
210.0000 mg | SUBCUTANEOUS | 5 refills | Status: DC
  Filled 2022-01-06: qty 1.91, 28d supply, fill #0

## 2022-01-06 NOTE — Patient Instructions (Signed)
Your next TEZSPIRE dose is due on 02/03/22, 03/03/22, and every 4 weeks thereafter  CONTINUE Symbicort 160/4.53mg (2 puffs twice daily), montelukast '10mg'$  daily, Spiriva 2 puffs daily, levocetirizine '5mg'$  daily  Your prescription will be shipped from [facility] Their phone number is [phone] call to schedule shipment and confirm address. They will mail your medication to your home.  Your copay should be affordable. If you call the pharmacy and it is not affordable, please double-check that they are billing through your copay card as secondary coverage. That copay card information is: Group: [id number]: OBernie Covey: [id number]: [id number] You will need to be seen by your provider in 3 to 4 months to assess how [facility] Everyis working for you. Please ensure you have a follow-up appointment scheduled in August or September. Call our clinic if you need to make this appointment.  How to manage an injection site reaction: Remember the 5 C's: COUNTER - leave on the counter at least 30 minutes but up to overnight to bring medication to room temperature. This may help prevent stinging COLD - place something cold (like an ice gel pack or cold water bottle) on the injection site just before cleansing with alcohol. This may help reduce pain CLARITIN - use Claritin (generic name is loratadine) for the first two weeks of treatment or the day of, the day before, and the day after injecting. This will help to minimize injection site reactions CORTISONE CREAM - apply if injection site is irritated and itching CALL ME - if injection site reaction is bigger than the size of your fist, looks infected, blisters, or if you develop hives

## 2022-01-06 NOTE — Progress Notes (Signed)
HPI Patient presents today to [facility] to see pharmacy team for [facility] new start.  Past medical history includes severe persistent allergic asthma. She is transitioning from [place]. Her last Xolair dose was on 12/08/21 (and took on the 23rd of every month). She is taking her last dose of the prednisone taper today.  She states she self-administered Xolair in the back of her arm by lifting the arm. She states she administered Xolair straight from refrigerator. States she was counseled to self-administer in this way by our pulmonary team when she transitioned to self-administration of Xolair  Respiratory Medications Current regimen: Symbicort 160/4.75mg (2 puffs twice daily), montelukast '10mg'$  daily, Spiriva 2 puffs daily, levocetirizine '5mg'$  daily  OBJECTIVE Allergies  Allergen Reactions   Benadryl [Diphenhydramine Hcl] Shortness Of Breath    Pt states that she feels fine if she sits down, but if she is moving around after taking benadryl, she feels SOB   Penicillins Shortness Of Breath    Has patient had a PCN reaction causing immediate rash, facial/tongue/throat swelling, SOB or lightheadedness with hypotension: yes Has patient had a PCN reaction causing severe rash involving mucus membranes or skin necrosis: no Has patient had a PCN reaction that required hospitalization: no Has patient had a PCN reaction occurring within the last 10 years: no If all of the above answers are "NO", then may proceed with Cephalosporin use. Patient reports she CAN TAKE kEFLEX AND SUPRAX   Adhesive [Tape] Hives   Percocet [Oxycodone-Acetaminophen]    Lactose Intolerance (Gi) Diarrhea and Nausea And Vomiting   Latex Rash   Omnipaque [Iohexol] Itching    Slight itching in mouth and tongue. No swelling, wheezing, or hives.   Sulfa Antibiotics Rash   Ultram [Tramadol Hcl] Rash    Outpatient Encounter Medications as of 01/06/2022  Medication Sig   albuterol (PROVENTIL) (2.5 MG/3ML) 0.083% nebulizer  solution Inhale 1 vial (2.5 mg total) by nebulization every 6 (six) hours as needed for wheezing or shortness of breath.   albuterol (VENTOLIN HFA) 108 (90 Base) MCG/ACT inhaler Inhale 2 puffs into the lungs every 6 (six) hours as needed for wheezing or shortness of breath.   aspirin EC 81 MG tablet Take 81 mg by mouth daily. Swallow whole.   Azelaic Acid (FINACEA) 15 % gel Apply topically to face up to twice daily,   benzonatate (TESSALON) 200 MG capsule Take 1 capsule (200 mg total) by mouth 3 (three) times daily as needed.   budesonide-formoterol (SYMBICORT) 160-4.5 MCG/ACT inhaler INHALE 2 PUFFS BY MOUTH 2 TIMES DAILY   chlorpheniramine-HYDROcodone (TUSSIONEX PENNKINETIC ER) 10-8 MG/5ML Take 5 mLs by mouth every 12 (twelve) hours as needed for cough.   hyoscyamine (LEVBID) 0.375 MG 12 hr tablet TAKE 1 TABLET BY MOUTH 2 TIMES DAILY   levocetirizine (XYZAL) 5 MG tablet TAKE 1 TABLET BY MOUTH EVERY EVENING   medroxyPROGESTERone (PROVERA) 10 MG tablet TAKE 1 TABLET BY MOUTH ONCE A DAY FOR 2 WEEKS, REPEAT AS DIRECTED   metFORMIN (GLUCOPHAGE XR) 750 MG 24 hr tablet Take 1 tablet by mouth daily with breakfast.   metroNIDAZOLE (METROCREAM) 0.75 % cream Apply topically to face up to 2 times a day   montelukast (SINGULAIR) 10 MG tablet TAKE 1 TABLET BY MOUTH AT BEDTIME   PHEXXI 1.8-1-0.4 % GEL PLACE 1 APPLICATORFUL VAGINALLY AS NEEDED. IMMEDIATELY BEFORE OR UP TO 1 HOUR BEFORE SEX   predniSONE (DELTASONE) 10 MG tablet Take 4 tablets by mouth for 3 days, 3 tablets for 3  days, 2 tablets for 3 days, then 1 tablet daily   predniSONE (DELTASONE) 10 MG tablet Take 1 tablet (10 mg total) by mouth daily with breakfast.   Tezepelumab-ekko (TEZSPIRE) 210 MG/1.91ML SOAJ Inject 210 mg into the skin every 28 (twenty-eight) days. Courier to pulm: [address], [place] [address]. Appt on 01/02/22   Tiotropium Bromide Monohydrate (SPIRIVA RESPIMAT) 1.25 MCG/ACT AERS Inhale 2 puffs into the lungs daily.    No facility-administered encounter medications on file as of 01/06/2022.     Immunization History  Administered Date(s) Administered   Hepatitis B 01/02/2012   Influenza,inj,Quad PF,6+ Mos 05/07/2019   Influenza-Unspecified 05/07/2019, 05/21/2020, 05/08/2021   PNEUMOCOCCAL CONJUGATE-20 02/27/2021   Pneumococcal Polysaccharide-23 07/03/2019   Tdap 04/13/2019     PFTs    Latest Ref Rng & Units 01/25/2021    3:46 PM 01/20/2018    9:06 AM  PFT Results  FVC-Pre L 4.75   4.68    FVC-Predicted Pre % 104   102    FVC-Post L 4.91   4.74    FVC-Predicted Post % 107   104    Pre FEV1/FVC % % 85   84    Post FEV1/FCV % % 86   86    FEV1-Pre L 4.05   3.94    FEV1-Predicted Pre % 106   102    FEV1-Post L 4.23   4.06    DLCO uncorrected ml/min/mmHg 29.18     DLCO UNC% % 107     DLCO corrected ml/min/mmHg 29.18     DLCO COR %Predicted % 107     DLVA Predicted % 110     TLC L 6.90     TLC % Predicted % 115     RV % Predicted % 130       Eosinophils Most recent blood eosinophil count was 100 cells/microL taken on 10/24/21.   IgE: 589 on 10/24/21  Assessment   Biologics training for tezepulumab [facility] Every)  Goals of therapy: Mechanism: 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 This blocks TSLP from its effect on inflammation including reduce eosinophils, IgE, FeNO, IL-5, and IL-13. Mechanism is not definitively established. Reviewed that [facility] Every is add-on medication and patient must continue maintenance inhaler regimen. Response to therapy: may take 3-4 months to determine efficacy.  Side effects: injection site reaction (6-18%), antibody development (2%), arthralgia (4%), back pain (4%), pharyngitis (4%)  Dose: Tezspire 210 mg once every 4 weeks  Administration/Storage:  Reviewed administration sites of thigh or abdomen (at least 2-3 inches away from abdomen). Reviewed the upper arm is only appropriate if caregiver is administering injection  Do not shake pen/syringe as  this could lead to product foaming or precipitation. Do not shake syringe as this could lead to product foaming or precipitation.  Access: Approval of Tezspire through: insurance Patient enrolled into copay card program to help with copay assistance.  Patient self-administered Tezspire '210mg'$ /1.91 ml in right abdomen using [facility]-supplied medication Tezspire '210mg'$ /1.91 ml Autoinjector pen NDC: [phone] Lot: [id number] Expiration: 02/15/2024  Patient monitored for 30 minutes for adverse reaction.  Patient tolerated without issue. Injection site checked and no reaction noted. Patient confirms no itchiness or irritation at injection site.  Medication Reconciliation  A drug regimen assessment was performed, including review of allergies, interactions, disease-state management, dosing and immunization history. Medications were reviewed with the patient, including name, instructions, indication, goals of therapy, potential side effects, importance of adherence, and safe use.  Drug interaction(s): none  noted  We did review importance of notifying our clinic if she plans to or becomes pregnant. She states she is not currently using contraception. Reviewed that there is no data about Tezspire use in pregnancy or during breastfeeding. Continued use of asthma biologics is based on risk of data scarcity vs benefit of controlled asthma. She verbalized understanding.  PLAN Continue Tezspire '210mg'$  SQ every 28 days.  Next dose is due 02/03/22. We reviewed importance of adherence to every 4 weeks dosing (and not using 22nd of every month benchmark).  Rx sent to: [facility]: [phone] .   Continue maintenance asthma regimen of: Symbicort 160/4.23mg (2 puffs twice daily), montelukast '10mg'$  daily, Spiriva 2 puffs daily, levocetirizine '5mg'$  daily  All questions encouraged and answered.  Instructed patient to reach out with any further questions or concerns.  Thank you for  allowing pharmacy to participate in this patient's care.  This appointment required 60 minutes of patient care (this includes precharting, chart review, review of results, face-to-face care, etc.).   DKnox Saliva PharmD, MPH, BCPS, CPP Clinical Pharmacist (Rheumatology and Pulmonology)

## 2022-01-07 ENCOUNTER — Other Ambulatory Visit (HOSPITAL_COMMUNITY): Payer: Self-pay

## 2022-01-14 ENCOUNTER — Other Ambulatory Visit (HOSPITAL_COMMUNITY): Payer: Self-pay

## 2022-01-22 ENCOUNTER — Other Ambulatory Visit: Payer: Self-pay | Admitting: Pharmacist

## 2022-01-22 ENCOUNTER — Other Ambulatory Visit (HOSPITAL_COMMUNITY): Payer: Self-pay

## 2022-01-22 DIAGNOSIS — J4551 Severe persistent asthma with (acute) exacerbation: Secondary | ICD-10-CM

## 2022-01-22 MED ORDER — TEZSPIRE 210 MG/1.91ML ~~LOC~~ SOAJ
210.0000 mg | SUBCUTANEOUS | 5 refills | Status: DC
  Filled 2022-01-22 – 2022-01-23 (×2): qty 1.91, 28d supply, fill #0
  Filled 2022-02-24: qty 1.91, 28d supply, fill #1
  Filled 2022-03-19: qty 1.91, 28d supply, fill #2
  Filled 2022-04-16: qty 1.91, 28d supply, fill #3
  Filled 2022-05-16: qty 1.91, 28d supply, fill #4
  Filled 2022-06-11: qty 1.91, 28d supply, fill #5

## 2022-01-23 ENCOUNTER — Other Ambulatory Visit (HOSPITAL_COMMUNITY): Payer: Self-pay

## 2022-01-24 ENCOUNTER — Telehealth: Payer: 59 | Admitting: Physician Assistant

## 2022-01-24 ENCOUNTER — Other Ambulatory Visit (HOSPITAL_COMMUNITY): Payer: Self-pay

## 2022-01-24 DIAGNOSIS — H60332 Swimmer's ear, left ear: Secondary | ICD-10-CM

## 2022-01-24 MED ORDER — CIPROFLOXACIN-DEXAMETHASONE 0.3-0.1 % OT SUSP
4.0000 [drp] | Freq: Two times a day (BID) | OTIC | 0 refills | Status: DC
  Filled 2022-01-24: qty 7.5, 19d supply, fill #0

## 2022-01-24 NOTE — Progress Notes (Signed)
E Visit for Swimmer's Ear  We are sorry that you are not feeling well. Here is how we plan to help!  Based on what you have shared with me it looks like you have swimmers ear. Swimmer's ear is a redness or swelling, irritation, or infection of your outer ear canal.  These symptoms usually occur within a few days of swimming.  Your ear canal is a tube that goes from the opening of the ear to the eardrum.  When water stays in your ear canal, germs can grow.  This is a painful condition that often happens to children and swimmers of all ages.  It is not contagious and oral antibiotics are not required to treat uncomplicated swimmer's ear.  The usual symptoms include: Itching inside the ear, Redness or a sense of swelling in the ear, Pain when the ear is tugged on when pressure is placed on the ear, Pus draining from the infected ear.  I have prescribed Ciprodex drops. Apply 4 drops to affected ear twice daily for 7 days.    In certain cases swimmer's ear may progress to a more serious bacterial infection of the middle or inner ear.  If you have a fever 102 and up and significantly worsening symptoms, this could indicate a more serious infection moving to the middle/inner and needs face to face evaluation in an office by a provider.  Your symptoms should improve over the next 3 days and should resolve in about 7 days.  HOME CARE:  Wash your hands frequently. Do not place the tip of the bottle on your ear or touch it with your fingers. You can take Acetominophen 650 mg every 4-6 hours as needed for pain.  If pain is severe or moderate, you can apply a heating pad (set on low) or hot water bottle (wrapped in a towel) to outer ear for 20 minutes.  This will also increase drainage. Avoid ear plugs Do not use Q-tips After showers, help the water run out by tilting your head to one side.  GET HELP RIGHT AWAY IF:  Fever is over 102.2 degrees. You develop progressive ear pain or hearing loss. Ear  symptoms persist longer than 3 days after treatment.  MAKE SURE YOU:  Understand these instructions. Will watch your condition. Will get help right away if you are not doing well or get worse.  TO PREVENT SWIMMER'S EAR: Use a bathing cap or custom fitted swim molds to keep your ears dry. Towel off after swimming to dry your ears. Tilt your head or pull your earlobes to allow the water to escape your ear canal. If there is still water in your ears, consider using a hairdryer on the lowest setting.   Thank you for choosing an e-visit.  Your e-visit answers were reviewed by a board certified advanced clinical practitioner to complete your personal care plan. Depending upon the condition, your plan could have included both over the counter or prescription medications.  Please review your pharmacy choice. Make sure the pharmacy is open so you can pick up prescription now. If there is a problem, you may contact your provider through [employer] and have the prescription routed to another pharmacy.  Your safety is important to [place]. If you have drug allergies check your prescription carefully.   For the next 24 hours you can use MyChart to ask questions about today's visit, request a non-urgent call back, or ask for a work or school excuse. You will get an email in  the next two days asking about your experience. I hope that your e-visit has been valuable and will speed your recovery.   I provided 5 minutes of non face-to-face time during this encounter for chart review and documentation.

## 2022-01-28 ENCOUNTER — Ambulatory Visit: Payer: 59 | Admitting: Emergency Medicine

## 2022-01-28 ENCOUNTER — Encounter: Payer: Self-pay | Admitting: Emergency Medicine

## 2022-01-28 DIAGNOSIS — J4551 Severe persistent asthma with (acute) exacerbation: Secondary | ICD-10-CM

## 2022-01-28 DIAGNOSIS — J301 Allergic rhinitis due to pollen: Secondary | ICD-10-CM

## 2022-01-28 NOTE — Assessment & Plan Note (Signed)
She had exacerbations in April/May, question due to the evolving allergy season, humidity.  Treated with prednisone.  She has been changed recently from Xolair to [place], has received 1 dose.  Plan to continue her current regimen, assess for clinical stability and improvement on the biologic.  I will follow her in 2 months  Please continue Symbicort 2 puffs twice a day.  Rinse and gargle after using. Continue your Spiriva once daily. Keep albuterol available to use 2 puffs up to every 4 hours if needed for shortness of breath, chest tightness, wheezing.  Continue your Tezspire once every 28 days. Follow with Dr. Lamonte Sakai in 2 months or sooner if you have any problems.

## 2022-01-28 NOTE — Progress Notes (Signed)
Subjective:    Patient ID: Tammy Chapman, female    DOB: 06/15/1991, 31 y.o.   MRN: [id number]   HPI  ROV 01/31/2021 -- 31 year old woman who has moderate persistent asthma as well as upper airway irritation syndrome.  Both are impacted by her GERD and allergic rhinitis due to multiple allergies.  She has an elevated IgE, is on Xolair since 10/2019, getting every 4 weeks.  We have been managing her on Symbicort.  Spiriva Respimat was added earlier this year and she feels that she has benefited.  Remains on Singulair, Xyzal.  She was dealing with clearing cough and was using Tussionex, not requiring currently.  Her breathing and cough are more difficult at the end of the month before it is time for next [place] 10/24/21 --follow-up visit 31 year old woman with moderate persistent asthma as well as upper airway irritation syndrome and chronic cough.  She has a significant allergic phenotype and is on Xolair every 4 weeks.  Bronchodilator regimen includes Symbicort, Spiriva.  She is also on Singulair, Xyzal.  She has a history of GERD but has been able to come off of therapy. Today she reports that she has been having some increased cough for about 2 weeks. Only occasional albuterol use, few times a month.   ROV 01/28/2022 --31 year old woman with moderate persistent asthma and upper airway irritation syndrome with associated chronic cough, chronic allergies.  She has been on Xolair every 4 weeks, just changed to [place], has had one dose, next 6/19.  Her bronchodilator regimen includes Symbicort, Spiriva.  She had flaring symptoms in early May, required prednisone.  She is on Xyzal, singulair.  She is doing ok - fairly well controlled, has more difficulty when outside. Has wheeze after outside exposures. Rarely needs albuterol on a normal day. Uses about 4-5x a month.    Review of Systems As per HPI      Objective:   Physical Exam Today's Vitals   01/28/22 1600  BP: 118/80  Pulse: 88   SpO2: 97%  Weight: 238 lb (108 kg)  Height: '5\' 10"'$  (1.778 m)   Body mass index is 34.15 kg/m.   Gen: Pleasant, well-nourished, in no distress,  normal affect  ENT: No lesions,  mouth clear,  oropharynx clear, no postnasal drip  Neck: No JVD, no UA noise  Lungs: No use of accessory muscles, no crackles or wheezing on normal respiration, no wheeze on forced expiration  Cardiovascular: RRR, heart sounds normal, no murmur or gallops, no peripheral edema  Musculoskeletal: No deformities, no cyanosis or clubbing  Neuro: alert, awake, non focal  Skin: Warm, no lesions or rash     Assessment & Plan:  Severe persistent allergic asthma with acute exacerbation She had exacerbations in April/May, question due to the evolving allergy season, humidity.  Treated with prednisone.  She has been changed recently from Xolair to [place], has received 1 dose.  Plan to continue her current regimen, assess for clinical stability and improvement on the biologic.  I will follow her in 2 months  Please continue Symbicort 2 puffs twice a day.  Rinse and gargle after using. Continue your Spiriva once daily. Keep albuterol available to use 2 puffs up to every 4 hours if needed for shortness of breath, chest tightness, wheezing.  Continue your Tezspire once every 28 days. Follow with Dr. Lamonte Sakai in 2 months or sooner if you have any problems.   Allergic rhinitis Continue Singulair and Xyzal as you have been taking  them.   Baltazar Apo, MD, PhD 01/28/2022, 4:28 PM [facility] [phone] or if no answer [phone]

## 2022-01-28 NOTE — Assessment & Plan Note (Signed)
Continue Singulair and Xyzal as you have been taking them.

## 2022-01-28 NOTE — Patient Instructions (Addendum)
Please continue Symbicort 2 puffs twice a day.  Rinse and gargle after using. Continue your Spiriva once daily. Keep albuterol available to use 2 puffs up to every 4 hours if needed for shortness of breath, chest tightness, wheezing.  Continue Singulair and Xyzal as you have been taking them. Continue your Tezspire once every 28 days. Follow with Dr. Lamonte Sakai in 2 months or sooner if you have any problems.

## 2022-01-29 ENCOUNTER — Other Ambulatory Visit (HOSPITAL_COMMUNITY): Payer: Self-pay

## 2022-02-04 ENCOUNTER — Other Ambulatory Visit (HOSPITAL_COMMUNITY): Payer: Self-pay

## 2022-02-21 ENCOUNTER — Other Ambulatory Visit (HOSPITAL_COMMUNITY): Payer: Self-pay

## 2022-02-24 ENCOUNTER — Other Ambulatory Visit (HOSPITAL_COMMUNITY): Payer: Self-pay

## 2022-02-27 ENCOUNTER — Other Ambulatory Visit (HOSPITAL_COMMUNITY): Payer: Self-pay

## 2022-03-19 ENCOUNTER — Other Ambulatory Visit (HOSPITAL_COMMUNITY): Payer: Self-pay

## 2022-03-19 IMAGING — DX DG CHEST 2V
2 series · 2 of 2 positions shown · non-contrast
Comparison: 05/31/2019

CLINICAL DATA: Dyspnea on exertion.  History of COVID pneumonia.

EXAM:
CHEST - 2 VIEW

[chest pa]
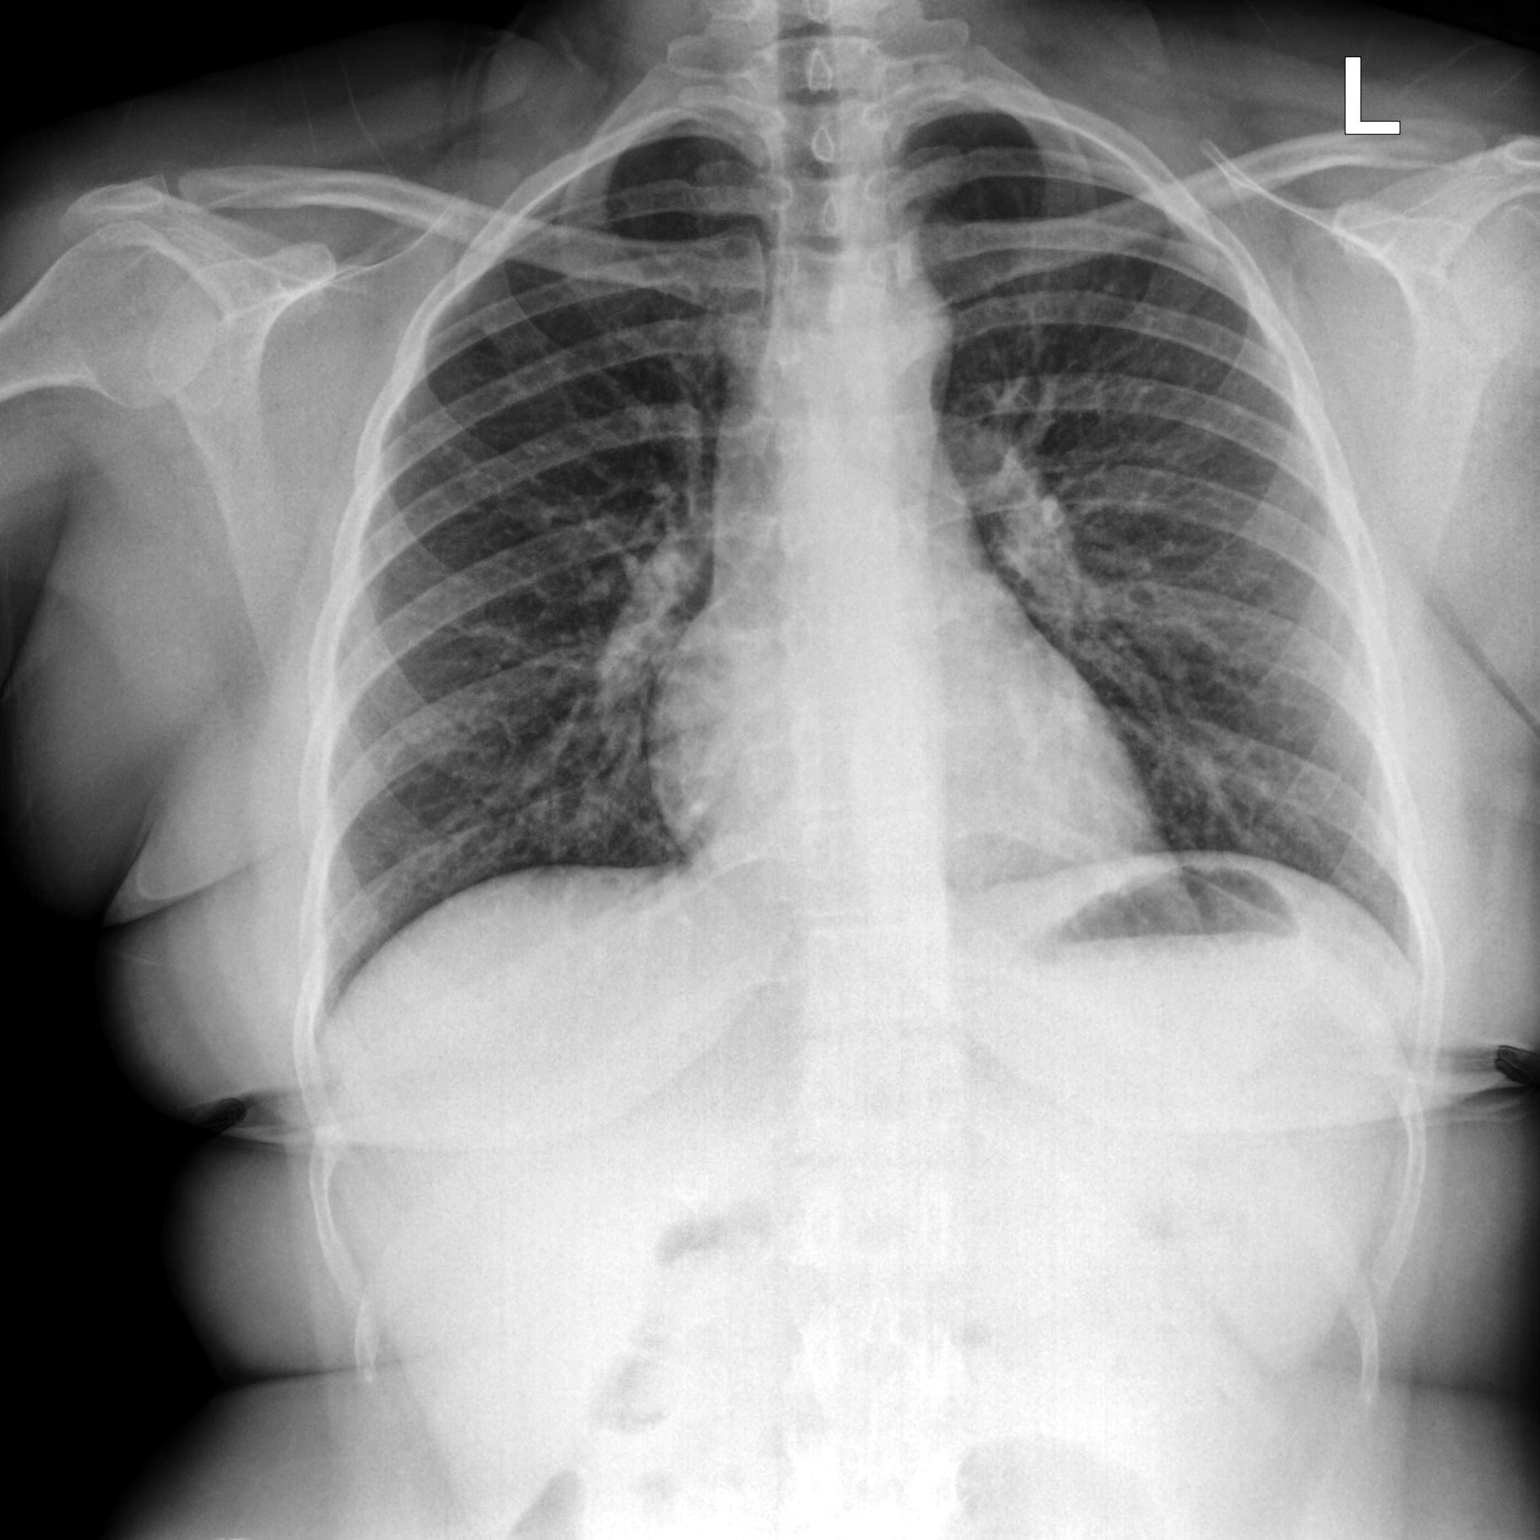

[chest lat]
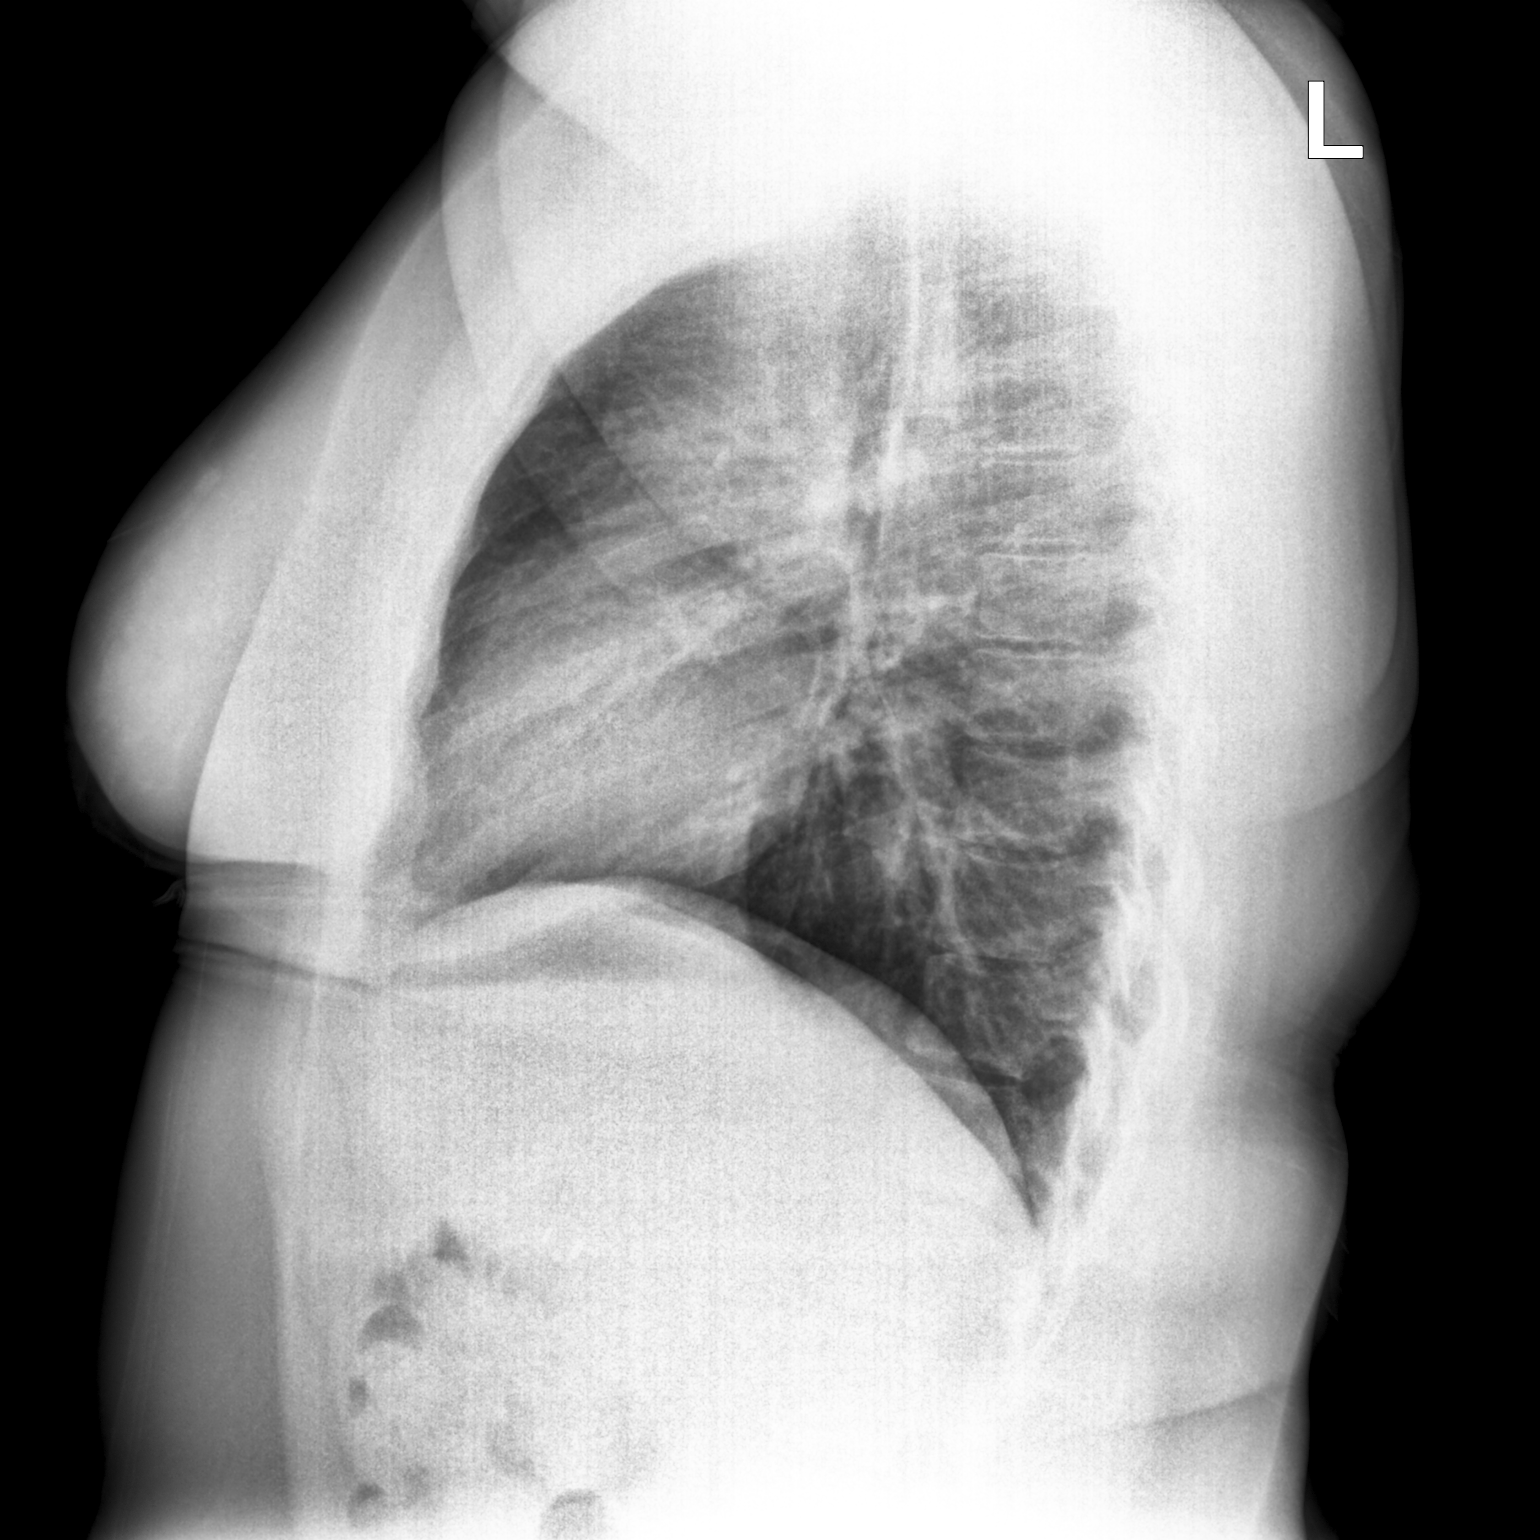

[2 of 2 positions shown; findings below may reference images not displayed]

FINDINGS: The cardiac silhouette, mediastinal and hilar contours are within
normal limits.

The lungs are clear of an acute process. No infiltrates or
effusions. No pulmonary lesions.

The bony thorax is intact.
IMPRESSION: No acute cardiopulmonary findings.

## 2022-03-21 ENCOUNTER — Other Ambulatory Visit (HOSPITAL_COMMUNITY): Payer: Self-pay

## 2022-03-27 ENCOUNTER — Other Ambulatory Visit (HOSPITAL_COMMUNITY): Payer: Self-pay

## 2022-03-27 ENCOUNTER — Other Ambulatory Visit: Payer: Self-pay | Admitting: Emergency Medicine

## 2022-03-27 MED ORDER — MONTELUKAST SODIUM 10 MG PO TABS
ORAL_TABLET | Freq: Every day | ORAL | 10 refills | Status: DC
  Filled 2022-03-27: qty 30, 30d supply, fill #0
  Filled 2022-05-01: qty 30, 30d supply, fill #1
  Filled 2022-06-12: qty 30, 30d supply, fill #2
  Filled 2022-07-21: qty 30, 30d supply, fill #3
  Filled 2022-09-02: qty 30, 30d supply, fill #4
  Filled 2023-03-18: qty 30, 30d supply, fill #5

## 2022-03-27 MED ORDER — LEVOCETIRIZINE DIHYDROCHLORIDE 5 MG PO TABS
ORAL_TABLET | Freq: Every evening | ORAL | 10 refills | Status: DC
  Filled 2022-03-27: qty 30, 30d supply, fill #0
  Filled 2022-05-01: qty 30, 30d supply, fill #1
  Filled 2022-06-12: qty 30, 30d supply, fill #2
  Filled 2022-07-21: qty 30, 30d supply, fill #3
  Filled 2022-09-02: qty 30, 30d supply, fill #4
  Filled 2023-03-18: qty 30, 30d supply, fill #5

## 2022-04-08 ENCOUNTER — Ambulatory Visit: Payer: 59 | Admitting: Emergency Medicine

## 2022-04-08 ENCOUNTER — Encounter: Payer: Self-pay | Admitting: Emergency Medicine

## 2022-04-08 DIAGNOSIS — J4542 Moderate persistent asthma with status asthmaticus: Secondary | ICD-10-CM

## 2022-04-08 DIAGNOSIS — J301 Allergic rhinitis due to pollen: Secondary | ICD-10-CM

## 2022-04-08 NOTE — Assessment & Plan Note (Signed)
Significant clinical response and improvement since she changed from Xolair to [employer].  I will plan to recheck her eosinophil count and IgE at her next office visit.  Continue this regimen.  I am very pleased with your response to the Tezspire.  We will plan to continue it as you have been taking it.  Please call if you have any reaction to the injection Continue Spiriva and Symbicort as you have been taking them Keep albuterol available to use 2 puffs if needed for shortness of breath, chest tightness, wheezing Continue Singulair 10 mg each evening Follow Dr. Lamonte Sakai in 6 months or sooner if you have any problems.

## 2022-04-08 NOTE — Assessment & Plan Note (Addendum)
She is using Xyzal as well as one of the other nonsedating antihistamines, usually alternates Claritin with Allegra.  Plan to continue.

## 2022-04-08 NOTE — Progress Notes (Signed)
   Subjective:    Patient ID: Tammy Chapman, female    DOB: 1991-01-29, 31 y.o.   MRN: [id number]   HPI  ROV 01/28/2022 --31 year old woman with moderate persistent asthma and upper airway irritation syndrome with associated chronic cough, chronic allergies.  She has been on Xolair every 4 weeks, just changed to [place], has had one dose, next 6/19.  Her bronchodilator regimen includes Symbicort, Spiriva.  She had flaring symptoms in early May, required prednisone.  She is on Xyzal, singulair.  She is doing ok - fairly well controlled, has more difficulty when outside. Has wheeze after outside exposures. Rarely needs albuterol on a normal day. Uses about 4-5x a month.   ROV 04/08/22 --this follow-up visit for 31 year old woman with moderate persistent asthma.  She also deals with upper airway irritation syndrome and associated chronic cough, exacerbated by chronic allergies.  She was changed to Tezspire from [place] earlier this year.  Her other maintenance includes Spiriva, Symbicort, Xyzal + loratadine, Singulair.  She uses albuterol approximately. She reports that she has done much better on the Tezspire - has had much less impact on her breathing than usual summer months. Able to to walk, exert. Uses albuterol rarely - only twice since last time. No prednisone. Her QOL is much improved!   Review of Systems As per HPI      Objective:   Physical Exam Today's Vitals   04/08/22 1021  BP: 128/74  Pulse: 82  Temp: 98.9 F (37.2 C)  TempSrc: Oral  SpO2: 100%  Weight: 241 lb (109.3 kg)  Height: '5\' 10"'$  (1.778 m)   Body mass index is 34.58 kg/m.   Gen: Pleasant, well-nourished, in no distress,  normal affect  ENT: No lesions,  mouth clear,  oropharynx clear, no postnasal drip  Neck: No JVD, no UA noise  Lungs: No use of accessory muscles, no crackles or wheezing on normal respiration, no wheeze on forced expiration  Cardiovascular: RRR, heart sounds normal, no murmur or gallops, no  peripheral edema  Musculoskeletal: No deformities, no cyanosis or clubbing  Neuro: alert, awake, non focal  Skin: Warm, no lesions or rash     Assessment & Plan:  Moderate persistent asthma Significant clinical response and improvement since she changed from Xolair to [employer].  I will plan to recheck her eosinophil count and IgE at her next office visit.  Continue this regimen.  I am very pleased with your response to the Tezspire.  We will plan to continue it as you have been taking it.  Please call if you have any reaction to the injection Continue Spiriva and Symbicort as you have been taking them Keep albuterol available to use 2 puffs if needed for shortness of breath, chest tightness, wheezing Continue Singulair 10 mg each evening Follow Dr. Lamonte Sakai in 6 months or sooner if you have any problems.  Allergic rhinitis She is using Xyzal as well as one of the other nonsedating antihistamines, usually alternates Claritin with Allegra.  Plan to continue.   Baltazar Apo, MD, PhD 04/08/2022, 10:55 AM [facility] [phone] or if no answer [phone]

## 2022-04-08 NOTE — Patient Instructions (Addendum)
I am very pleased with your response to the Tezspire.  We will plan to continue it as you have been taking it.  Please call if you have any reaction to the injection Continue Spiriva and Symbicort as you have been taking them Keep albuterol available to use 2 puffs if needed for shortness of breath, chest tightness, wheezing Continue Singulair 10 mg each evening Continue Xyzal and loratadine/Claritin as you have been taking them. Follow Dr. Lamonte Sakai in 6 months or sooner if you have any problems.

## 2022-04-09 ENCOUNTER — Telehealth: Payer: Self-pay

## 2022-04-09 NOTE — Telephone Encounter (Signed)
Per Rinaldo Ratel, current Prior Authorization is expiring.  Submitted a Prior Authorization request to [facility] for TEZSPIRE via CoverMyMeds. Will update once we receive a response.   Key: [id number]

## 2022-04-10 NOTE — Telephone Encounter (Signed)
Received notification from [facility] regarding a prior authorization for TEZSPIRE. Authorization has been APPROVED from 04/09/22 to 04/09/23.   Patient must continue to fill through [place]: [phone]   Authorization # [id number]  Knox Saliva, PharmD, MPH, BCPS, CPP Clinical Pharmacist (Rheumatology and Pulmonology)

## 2022-04-16 ENCOUNTER — Other Ambulatory Visit (HOSPITAL_COMMUNITY): Payer: Self-pay

## 2022-04-17 ENCOUNTER — Other Ambulatory Visit (HOSPITAL_COMMUNITY): Payer: Self-pay

## 2022-04-26 ENCOUNTER — Other Ambulatory Visit (HOSPITAL_COMMUNITY): Payer: Self-pay

## 2022-04-28 ENCOUNTER — Other Ambulatory Visit (HOSPITAL_COMMUNITY): Payer: Self-pay

## 2022-05-01 ENCOUNTER — Other Ambulatory Visit (HOSPITAL_COMMUNITY): Payer: Self-pay

## 2022-05-02 ENCOUNTER — Other Ambulatory Visit (HOSPITAL_COMMUNITY): Payer: Self-pay

## 2022-05-12 ENCOUNTER — Other Ambulatory Visit (HOSPITAL_COMMUNITY): Payer: Self-pay

## 2022-05-15 ENCOUNTER — Telehealth: Payer: 59 | Admitting: Family Medicine

## 2022-05-15 DIAGNOSIS — B3731 Acute candidiasis of vulva and vagina: Secondary | ICD-10-CM

## 2022-05-15 MED ORDER — FLUCONAZOLE 150 MG PO TABS: 150.0000 mg | ORAL_TABLET | Freq: Once | ORAL | 0 refills | Status: AC

## 2022-05-15 NOTE — Progress Notes (Signed)

## 2022-05-16 ENCOUNTER — Other Ambulatory Visit (HOSPITAL_COMMUNITY): Payer: Self-pay

## 2022-05-20 ENCOUNTER — Other Ambulatory Visit (HOSPITAL_COMMUNITY): Payer: Self-pay

## 2022-06-05 ENCOUNTER — Other Ambulatory Visit: Payer: Self-pay

## 2022-06-05 MED ORDER — BUDESONIDE-FORMOTEROL FUMARATE 160-4.5 MCG/ACT IN AERO: INHALATION_SPRAY | RESPIRATORY_TRACT | 11 refills | Status: DC

## 2022-06-05 NOTE — Progress Notes (Signed)
Order for symbicort printed for pt assistance

## 2022-06-06 DIAGNOSIS — H6993 Unspecified Eustachian tube disorder, bilateral: Secondary | ICD-10-CM | POA: Diagnosis not present

## 2022-06-06 DIAGNOSIS — Z9622 Myringotomy tube(s) status: Secondary | ICD-10-CM | POA: Diagnosis not present

## 2022-06-11 ENCOUNTER — Other Ambulatory Visit (HOSPITAL_COMMUNITY): Payer: Self-pay

## 2022-06-12 ENCOUNTER — Other Ambulatory Visit (HOSPITAL_COMMUNITY): Payer: Self-pay

## 2022-06-13 ENCOUNTER — Telehealth: Payer: 59 | Admitting: Physician Assistant

## 2022-06-13 DIAGNOSIS — B09 Unspecified viral infection characterized by skin and mucous membrane lesions: Secondary | ICD-10-CM

## 2022-06-13 MED ORDER — PREDNISONE 10 MG (21) PO TBPK: ORAL_TABLET | ORAL | 0 refills | Status: DC

## 2022-06-13 NOTE — Progress Notes (Signed)
E Visit for Rash  We are sorry that you are not feeling well. Here is how we plan to help!  Based on what you shared with me you may have a virus. Some viruses can cause rashes called Viral Exanthems. These rashes may be itchy and may not be itchy. They are normally associated with low grade fevers, body aches, muscle aches, and fatigue.  Avoid contact with pregnant women until a diagnosis is made.  Most viral rashes are contagious (especially if a fever is present).  You can return to work or school after you have been fever-free for 24 hours without using medications to reduce your fever.   It is recommended to continue using allergy medications for itching. I will also prescribe Prednisone for itching as below:  Prednisone 10 mg daily for 6 days (see taper instructions below)  Directions for 6 day taper: Day 1: 2 tablets before breakfast, 1 after both lunch & dinner and 2 at bedtime Day 2: 1 tab before breakfast, 1 after both lunch & dinner and 2 at bedtime Day 3: 1 tab at each meal & 1 at bedtime Day 4: 1 tab at breakfast, 1 at lunch, 1 at bedtime Day 5: 1 tab at breakfast & 1 tab at bedtime Day 6: 1 tab at breakfast   HOME CARE:  Take cool showers and avoid direct sunlight. Apply cool compress or wet dressings. Take a bath in an oatmeal bath.  Sprinkle content of one Aveeno packet under running faucet with comfortably warm water.  Bathe for 15-20 minutes, 1-2 times daily.  Pat dry with a towel. Do not rub the rash. Use hydrocortisone cream. Take an antihistamine like Benadryl for widespread rashes that itch.  The adult dose of Benadryl is 25-50 mg by mouth 4 times daily. Caution:  This type of medication may cause sleepiness.  Do not drink alcohol, drive, or operate dangerous machinery while taking antihistamines.  Do not take these medications if you have prostate enlargement.  Read package instructions thoroughly on all medications that you take.  GET HELP RIGHT AWAY  IF:  Symptoms don't go away after treatment. Severe itching that persists. If you rash spreads or swells. If you rash begins to smell. If it blisters and opens or develops a yellow-brown crust. You develop a fever. You have a sore throat. You become short of breath.  MAKE SURE YOU:  Understand these instructions. Will watch your condition. Will get help right away if you are not doing well or get worse.  Thank you for choosing an e-visit.  Your e-visit answers were reviewed by a board certified advanced clinical practitioner to complete your personal care plan. Depending upon the condition, your plan could have included both over the counter or prescription medications.  Please review your pharmacy choice. Make sure the pharmacy is open so you can pick up prescription now. If there is a problem, you may contact your provider through [employer] and have the prescription routed to another pharmacy.  Your safety is important to [place]. If you have drug allergies check your prescription carefully.   For the next 24 hours you can use MyChart to ask questions about today's visit, request a non-urgent call back, or ask for a work or school excuse. You will get an email in the next two days asking about your experience. I hope that your e-visit has been valuable and will speed your recovery.  I have spent 5 minutes in review of e-visit questionnaire, review and  updating patient chart, medical decision making and response to patient.   Mar Daring, PA-C

## 2022-06-18 ENCOUNTER — Other Ambulatory Visit (HOSPITAL_COMMUNITY): Payer: Self-pay

## 2022-06-26 DIAGNOSIS — Z9622 Myringotomy tube(s) status: Secondary | ICD-10-CM | POA: Diagnosis not present

## 2022-06-26 DIAGNOSIS — H6993 Unspecified Eustachian tube disorder, bilateral: Secondary | ICD-10-CM | POA: Diagnosis not present

## 2022-07-04 ENCOUNTER — Other Ambulatory Visit (HOSPITAL_COMMUNITY): Payer: Self-pay

## 2022-07-09 ENCOUNTER — Other Ambulatory Visit: Payer: Self-pay | Admitting: Nurse Practitioner

## 2022-07-09 ENCOUNTER — Other Ambulatory Visit (HOSPITAL_COMMUNITY): Payer: Self-pay

## 2022-07-09 DIAGNOSIS — J4551 Severe persistent asthma with (acute) exacerbation: Secondary | ICD-10-CM

## 2022-07-09 MED ORDER — TEZSPIRE 210 MG/1.91ML ~~LOC~~ SOAJ
210.0000 mg | SUBCUTANEOUS | 3 refills | Status: DC
  Filled 2022-07-09: qty 1.91, 28d supply, fill #0

## 2022-07-09 NOTE — Telephone Encounter (Signed)
Refill sent for TEZSPIRE to [facility]: [id number]   Dose: 210 mg every 4 weeks  Last OV: 04/08/22 Provider: Dr. Lamonte Sakai  Next OV: not yet scheduled  Knox Saliva, PharmD, MPH, BCPS Clinical Pharmacist (Rheumatology and Pulmonology)

## 2022-07-16 ENCOUNTER — Other Ambulatory Visit (HOSPITAL_COMMUNITY): Payer: Self-pay

## 2022-07-21 ENCOUNTER — Other Ambulatory Visit (HOSPITAL_COMMUNITY): Payer: Self-pay

## 2022-07-23 ENCOUNTER — Other Ambulatory Visit: Payer: Self-pay | Admitting: Pharmacist

## 2022-07-23 ENCOUNTER — Other Ambulatory Visit (HOSPITAL_COMMUNITY): Payer: Self-pay

## 2022-07-23 DIAGNOSIS — J4551 Severe persistent asthma with (acute) exacerbation: Secondary | ICD-10-CM

## 2022-07-23 MED ORDER — TEZSPIRE 210 MG/1.91ML ~~LOC~~ SOAJ
210.0000 mg | SUBCUTANEOUS | 3 refills | Status: DC
  Filled 2022-07-23 – 2022-08-12 (×2): qty 1.91, 28d supply, fill #0
  Filled 2022-09-03: qty 1.91, 28d supply, fill #1
  Filled 2022-10-02: qty 1.91, 28d supply, fill #2
  Filled 2022-10-31 (×2): qty 1.91, 28d supply, fill #3

## 2022-07-24 ENCOUNTER — Telehealth: Payer: Self-pay

## 2022-07-24 ENCOUNTER — Other Ambulatory Visit (HOSPITAL_COMMUNITY): Payer: Self-pay

## 2022-07-24 ENCOUNTER — Other Ambulatory Visit: Payer: Self-pay

## 2022-07-24 DIAGNOSIS — J4540 Moderate persistent asthma, uncomplicated: Secondary | ICD-10-CM

## 2022-07-24 NOTE — Telephone Encounter (Signed)
Patient wants to know if you can send in a new order for neb supplies to [facility]. Patient needs new tubing and the pipe attachment for it, and [place] says they need a new order.

## 2022-07-24 NOTE — Telephone Encounter (Signed)
Yes, please send order for nebulizer supplies under asthma diagnosis. Thanks.

## 2022-08-05 ENCOUNTER — Other Ambulatory Visit: Payer: Self-pay

## 2022-08-07 ENCOUNTER — Telehealth: Payer: 59 | Admitting: Physician Assistant

## 2022-08-07 ENCOUNTER — Other Ambulatory Visit: Payer: Self-pay

## 2022-08-07 DIAGNOSIS — J019 Acute sinusitis, unspecified: Secondary | ICD-10-CM

## 2022-08-07 DIAGNOSIS — B9689 Other specified bacterial agents as the cause of diseases classified elsewhere: Secondary | ICD-10-CM

## 2022-08-07 MED ORDER — DOXYCYCLINE HYCLATE 100 MG PO TABS: 100.0000 mg | ORAL_TABLET | Freq: Two times a day (BID) | ORAL | 0 refills | Status: DC

## 2022-08-07 MED ORDER — PROMETHAZINE-DM 6.25-15 MG/5ML PO SYRP: 5.0000 mL | ORAL_SOLUTION | Freq: Four times a day (QID) | ORAL | 0 refills | Status: DC | PRN

## 2022-08-07 NOTE — Progress Notes (Signed)
E-Visit for Sinus Problems  We are sorry that you are not feeling well.  Here is how we plan to help!  Based on what you have shared with me it looks like you have sinusitis.  Sinusitis is inflammation and infection in the sinus cavities of the head.  Based on your presentation I believe you most likely have Acute Bacterial Sinusitis.  This is an infection caused by bacteria and is treated with antibiotics. I have prescribed Doxycycline '100mg'$  by mouth twice a day for 10 days. And Promethazine DM Take 27m every 6 hours as needed for cough.  You may use an oral decongestant such as Mucinex D or if you have glaucoma or high blood pressure use plain Mucinex. Saline nasal spray help and can safely be used as often as needed for congestion.  If you develop worsening sinus pain, fever or notice severe headache and vision changes, or if symptoms are not better after completion of antibiotic, please schedule an appointment with a health care provider.    Sinus infections are not as easily transmitted as other respiratory infection, however we still recommend that you avoid close contact with loved ones, especially the very young and elderly.  Remember to wash your hands thoroughly throughout the day as this is the number one way to prevent the spread of infection!  Home Care: Only take medications as instructed by your medical team. Complete the entire course of an antibiotic. Do not take these medications with alcohol. A steam or ultrasonic humidifier can help congestion.  You can place a towel over your head and breathe in the steam from hot water coming from a faucet. Avoid close contacts especially the very young and the elderly. Cover your mouth when you cough or sneeze. Always remember to wash your hands.  Get Help Right Away If: You develop worsening fever or sinus pain. You develop a severe head ache or visual changes. Your symptoms persist after you have completed your treatment plan.  Make  sure you Understand these instructions. Will watch your condition. Will get help right away if you are not doing well or get worse.  Thank you for choosing an e-visit.  Your e-visit answers were reviewed by a board certified advanced clinical practitioner to complete your personal care plan. Depending upon the condition, your plan could have included both over the counter or prescription medications.  Please review your pharmacy choice. Make sure the pharmacy is open so you can pick up prescription now. If there is a problem, you may contact your provider through [employer] have the prescription routed to another pharmacy.  Your safety is important to [place] If you have drug allergies check your prescription carefully.   For the next 24 hours you can use MyChart to ask questions about today's visit, request a non-urgent call back, or ask for a work or school excuse. You will get an email in the next two days asking about your experience. I hope that your e-visit has been valuable and will speed your recovery.  I have spent 5 minutes in review of e-visit questionnaire, review and updating patient chart, medical decision making and response to patient.   JMar Daring PA-C

## 2022-08-12 ENCOUNTER — Other Ambulatory Visit (HOSPITAL_COMMUNITY): Payer: Self-pay

## 2022-08-12 ENCOUNTER — Other Ambulatory Visit: Payer: Self-pay

## 2022-08-12 DIAGNOSIS — M25512 Pain in left shoulder: Secondary | ICD-10-CM | POA: Diagnosis not present

## 2022-08-13 DIAGNOSIS — M25512 Pain in left shoulder: Secondary | ICD-10-CM | POA: Diagnosis not present

## 2022-08-22 DIAGNOSIS — M25511 Pain in right shoulder: Secondary | ICD-10-CM | POA: Diagnosis not present

## 2022-08-29 ENCOUNTER — Telehealth: Payer: Self-pay | Admitting: Pulmonary Disease

## 2022-08-29 ENCOUNTER — Other Ambulatory Visit: Payer: Self-pay

## 2022-08-29 MED ORDER — PREDNISONE 10 MG PO TABS: ORAL_TABLET | ORAL | 0 refills | Status: AC

## 2022-08-29 NOTE — Telephone Encounter (Signed)
Thank you :)

## 2022-08-29 NOTE — Telephone Encounter (Signed)
She is followed by Dr. Lamonte Sakai for asthma.  In [place] office today and had cough, burning in her chest, wheeze, and mild stridor.  Provider her with sample of [place] that she used in office.  Will send script for prednisone taper.

## 2022-09-03 ENCOUNTER — Other Ambulatory Visit (HOSPITAL_COMMUNITY): Payer: Self-pay

## 2022-09-05 ENCOUNTER — Other Ambulatory Visit (HOSPITAL_COMMUNITY): Payer: Self-pay

## 2022-09-10 ENCOUNTER — Other Ambulatory Visit (HOSPITAL_COMMUNITY): Payer: Self-pay

## 2022-09-16 ENCOUNTER — Ambulatory Visit (HOSPITAL_COMMUNITY): Payer: Commercial Managed Care - PPO | Admitting: Occupational Therapy

## 2022-09-18 ENCOUNTER — Ambulatory Visit
Admission: EM | Admit: 2022-09-18 | Discharge: 2022-09-18 | Disposition: A | Discharge status: Home / Self Care | Payer: Commercial Managed Care - PPO | Attending: Family Medicine | Admitting: Family Medicine

## 2022-09-18 DIAGNOSIS — R110 Nausea: Secondary | ICD-10-CM

## 2022-09-18 DIAGNOSIS — G43809 Other migraine, not intractable, without status migrainosus: Secondary | ICD-10-CM | POA: Diagnosis not present

## 2022-09-18 MED ORDER — DEXAMETHASONE SODIUM PHOSPHATE 10 MG/ML IJ SOLN
10.0000 mg | Freq: Once | INTRAMUSCULAR | Status: AC
  Administered 2022-09-18: 10 mg via INTRAMUSCULAR

## 2022-09-18 MED ORDER — ONDANSETRON 4 MG PO TBDP: 4.0000 mg | ORAL_TABLET | Freq: Three times a day (TID) | ORAL | 0 refills | Status: DC | PRN

## 2022-09-18 NOTE — ED Provider Notes (Signed)
[facility]    CSN: [id number] Arrival date & time: 09/18/22  1640      History   Chief Complaint Chief Complaint  Patient presents with   Migraine    HPI Tammy Chapman is a 32 y.o. female.   Presenting today with sharp stabbing migraine headache that started yesterday and has been unrelenting.  Having some light and sound sensitivity, nausea, dry heaving associated.  History of migraines, states she has taken Imitrex for quite some time for these but when she took 1 yesterday she got chest pain from it.  This dissipated within several hours.  She has also tried Tylenol, ibuprofen, Excedrin with minimal relief.  Denies head injury, mental status changes, extremity weakness numbness tingling, speech or swallowing changes.    Past Medical History:  Diagnosis Date   Allergy    Asthma    Bronchitis, mucopurulent recurrent ([place]) 01/19/2019   Chronic asthma 01/19/2019   Loose body of right knee 02/2013   Pregnancy induced hypertension    Recurrent otitis media of both ears 01/19/2019   Sprain and strain of medial collateral ligament of knee 02/2013   right    Patient Active Problem List   Diagnosis Date Noted   Acute non-recurrent maxillary sinusitis 09/24/2021   PCOS (polycystic ovarian syndrome) 09/24/2021   Gastroesophageal reflux disease without esophagitis 02/27/2021   Encounter for general adult medical examination with abnormal findings 02/27/2021   Allergic rhinitis 09/15/2019   Moderate persistent asthma 01/19/2019   Bronchitis, mucopurulent recurrent ([place]) 01/19/2019   GERD (gastroesophageal reflux disease) 08/04/2011    Past Surgical History:  Procedure Laterality Date   CESAREAN SECTION N/A 01/08/2017   Procedure: CESAREAN SECTION;  Surgeon: Jonnie Kind, MD;  Location: [place];  Service: Obstetrics;  Laterality: N/A;   CESAREAN SECTION N/A 07/01/2019   Procedure: REPEAT CESAREAN SECTION;  Surgeon: Jonnie Kind, MD;  Location: [facility];  Service: Obstetrics;  Laterality: N/A;   CHOLECYSTECTOMY  01/20/2008   EAR TUBE REMOVAL  2002   ESOPHAGOGASTRODUODENOSCOPY  05/15/2011   Procedure: ESOPHAGOGASTRODUODENOSCOPY (EGD);  Surgeon: Rogene Houston, MD;  Location[facility];  Service: Endoscopy;  Laterality: N/A;  3:15    KNEE ARTHROSCOPY Right 03/15/2013   Procedure: RIGHT  KNEE ARTHROSCOPY, FEMORAL PATELLA REEFING, LATERAL RELEASE, PARTIAL LATERAL MENISCECTOMY ;  Surgeon: Lorn Junes, MD;  Location: [place];  Service: Orthopedics;  Laterality: Right;   TONSILLECTOMY AND ADENOIDECTOMY  1999   TYMPANOSTOMY [place], 2011    OB History     Gravida  2   Para  2   Term  2   Preterm      AB      Living  2      SAB      IAB      Ectopic      Multiple      Live Births  2            Home Medications    Prior to Admission medications   Medication Sig Start Date End Date Taking? Authorizing Provider  ondansetron (ZOFRAN-ODT) 4 MG disintegrating tablet Take 1 tablet (4 mg total) by mouth every 8 (eight) hours as needed for nausea or vomiting. 09/18/22  Yes Volney American, PA-C  albuterol (PROVENTIL) (2.5 MG/3ML) 0.083% nebulizer solution Inhale 1 vial (2.5 mg total) by nebulization every 6 (six) hours as needed for wheezing or shortness of breath. 12/05/21  Cobb, Karie Schwalbe, NP  albuterol (VENTOLIN HFA) 108 (90 Base) MCG/ACT inhaler Inhale 2 puffs into the lungs every 6 (six) hours as needed for wheezing or shortness of breath. 01/31/21   Collene Gobble, MD  aspirin EC 81 MG tablet Take 81 mg by mouth daily. Swallow whole.    [provider]  Azelaic Acid (FINACEA) 15 % gel Apply topically to face up to twice daily, 04/15/21   Allyn Kenner, MD  benzonatate (TESSALON) 200 MG capsule Take 1 capsule (200 mg total) by mouth 3 (three) times daily as needed. 12/05/21   Cobb, Karie Schwalbe, NP  budesonide-formoterol (SYMBICORT) 160-4.5 MCG/ACT inhaler INHALE 2 PUFFS BY  MOUTH 2 TIMES DAILY 09/24/21 10/03/22  Collene Gobble, MD  budesonide-formoterol (SYMBICORT) 160-4.5 MCG/ACT inhaler INHALE 2 PUFFS BY MOUTH 2 TIMES DAILY 06/05/22 06/05/23  Collene Gobble, MD  doxycycline (VIBRA-TABS) 100 MG tablet Take 1 tablet (100 mg total) by mouth 2 (two) times daily. 08/07/22   Mar Daring, PA-C  levocetirizine (XYZAL) 5 MG tablet TAKE 1 TABLET BY MOUTH EVERY EVENING 03/27/22 03/27/23  Collene Gobble, MD  medroxyPROGESTERone (PROVERA) 10 MG tablet TAKE 1 TABLET BY MOUTH ONCE A DAY FOR 2 WEEKS, REPEAT AS DIRECTED 04/11/20 04/11/21  Jonnie Kind, MD  metFORMIN (GLUCOPHAGE XR) 750 MG 24 hr tablet Take 1 tablet by mouth daily with breakfast. 09/24/21   Janith Lima, MD  metroNIDAZOLE (METROCREAM) 0.75 % cream Apply topically to face up to 2 times a day 03/22/21     montelukast (SINGULAIR) 10 MG tablet TAKE 1 TABLET BY MOUTH AT BEDTIME 03/27/22 03/27/23  Collene Gobble, MD  PHEXXI 1.8-1-0.4 % GEL PLACE 1 APPLICATORFUL VAGINALLY AS NEEDED. IMMEDIATELY BEFORE OR UP TO 1 HOUR BEFORE SEX 11/14/19   Roma Schanz, CNM  predniSONE (STERAPRED UNI-PAK 21 TAB) 10 MG (21) TBPK tablet 6 day taper; take as directed on package instruction 06/13/22   Mar Daring, PA-C  promethazine-dextromethorphan (PROMETHAZINE-DM) 6.25-15 MG/5ML syrup Take 5 mLs by mouth 4 (four) times daily as needed. 08/07/22   Mar Daring, PA-C  Tezepelumab-ekko (TEZSPIRE) 210 MG/1.91ML SOAJ Inject 210 mg into the skin every 28 (twenty-eight) days. 07/23/22   Tresa Garter, MD  Tiotropium Bromide Monohydrate (SPIRIVA RESPIMAT) 1.25 MCG/ACT AERS Inhale 2 puffs into the lungs daily. 12/05/21   Cobb, Karie Schwalbe, NP    Family History Family History  Problem Relation Age of Onset   Diabetes Mother    Hypertension Father    Celiac disease Father    Kidney disease Maternal Uncle    Ulcerative colitis Maternal Grandmother    Heart disease Maternal Grandmother    Diabetes Maternal Grandfather     COPD Maternal Grandfather    Congestive Heart Failure Maternal Grandfather    Kidney disease Maternal Grandfather    Heart disease Paternal Grandmother    Renal cancer Paternal Grandfather    Heart disease Paternal Grandfather    Other Daughter        milk and soy allergy   Colon cancer Neg Hx    Esophageal cancer Neg Hx    Stomach cancer Neg Hx    Rectal cancer Neg Hx     Social History Social History   Tobacco Use   Smoking status: Never   Smokeless tobacco: Never  Vaping Use   Vaping Use: Never used  Substance Use Topics   Alcohol use: No   Drug use: No     Allergies  Benadryl [diphenhydramine hcl], Penicillins, Adhesive [tape], Percocet [oxycodone-acetaminophen], Lactose intolerance (gi), Latex, Omnipaque [iohexol], Sulfa antibiotics, and Ultram [tramadol hcl]   Review of Systems Review of Systems Per HPI  Physical Exam Triage Vital Signs ED Triage Vitals  Enc Vitals Group     BP 09/18/22 1643 130/86     Pulse Rate 09/18/22 1643 93     Resp 09/18/22 1643 20     Temp 09/18/22 1643 98.2 F (36.8 C)     Temp Source 09/18/22 1643 Oral     SpO2 09/18/22 1643 96 %     Weight --      Height --      Head Circumference --      Peak Flow --      Pain Score 09/18/22 1646 7     Pain Loc --      Pain Edu? --      Excl. in [place]? --    No data found.  Updated Vital Signs BP 130/86 (BP Location: Right Arm)   Pulse 93   Temp 98.2 F (36.8 C) (Oral)   Resp 20   LMP 09/13/2022   SpO2 96%   Visual Acuity Right Eye Distance:   Left Eye Distance:   Bilateral Distance:    Right Eye Near:   Left Eye Near:    Bilateral Near:     Physical Exam Vitals and nursing note reviewed.  Constitutional:      Appearance: Normal appearance. She is not ill-appearing.  HENT:     Head: Atraumatic.     Mouth/Throat:     Mouth: Mucous membranes are moist.  Eyes:     Extraocular Movements: Extraocular movements intact.     Conjunctiva/sclera: Conjunctivae normal.      Pupils: Pupils are equal, round, and reactive to light.  Cardiovascular:     Rate and Rhythm: Normal rate and regular rhythm.     Heart sounds: Normal heart sounds.  Pulmonary:     Effort: Pulmonary effort is normal.     Breath sounds: Normal breath sounds.  Musculoskeletal:        General: Normal range of motion.     Cervical back: Normal range of motion and neck supple.  Skin:    General: Skin is warm and dry.  Neurological:     General: No focal deficit present.     Mental Status: She is alert and oriented to person, place, and time.     Cranial Nerves: No cranial nerve deficit.     Motor: No weakness.     Gait: Gait normal.  Psychiatric:        Mood and Affect: Mood normal.        Thought Content: Thought content normal.        Judgment: Judgment normal.      UC Treatments / Results  Labs (all labs ordered are listed, but only abnormal results are displayed) Labs Reviewed - No data to display  EKG   Radiology No results found.  Procedures Procedures (including critical care time)  Medications Ordered in UC Medications  dexamethasone (DECADRON) injection 10 mg (10 mg Intramuscular Given 09/18/22 1713)    Initial Impression / Assessment and Plan / UC Course  I have reviewed the triage vital signs and the nursing notes.  Pertinent labs & imaging results that were available during my care of the patient were reviewed by me and considered in my medical decision making (see chart for details).     Because  she is already taken several over-the-counter NSAID medications, will will give IM Decadron and discussed good hydration, rest, dim lights and return precautions.  Final Clinical Impressions(s) / UC Diagnoses   Final diagnoses:  Other migraine without status migrainosus, not intractable  Nausea without vomiting   Discharge Instructions   None    ED Prescriptions     Medication Sig Dispense Auth. Provider   ondansetron (ZOFRAN-ODT) 4 MG disintegrating  tablet Take 1 tablet (4 mg total) by mouth every 8 (eight) hours as needed for nausea or vomiting. 20 tablet Volney American, [place]      PDMP not reviewed this encounter.   Volney American, [place] 09/18/22 [phone]

## 2022-09-18 NOTE — ED Triage Notes (Signed)
Pt reports she has a migraine x 1 day. Pt has sound and light sensitivity and dry heaving

## 2022-09-20 ENCOUNTER — Telehealth: Payer: Commercial Managed Care - PPO | Admitting: Nurse Practitioner

## 2022-09-20 DIAGNOSIS — J4542 Moderate persistent asthma with status asthmaticus: Secondary | ICD-10-CM | POA: Diagnosis not present

## 2022-09-20 MED ORDER — PREDNISONE 20 MG PO TABS: 40.0000 mg | ORAL_TABLET | Freq: Every day | ORAL | 0 refills | Status: AC

## 2022-09-20 NOTE — Progress Notes (Signed)
Visit for Asthma  Based on what you have shared with me, it looks like you may have a flare up of your asthma.  Asthma is a chronic (ongoing) lung disease which results in airway obstruction, inflammation and hyper-responsiveness.  Treatment: I have prescribed: Prednisone '40mg'$  by mouth per day for 5 - 7 days I ALSO RECOMMEND FOLLOWING UP WITH DR Lamonte Sakai regarding your chronic cough.    Asthma symptoms vary from person to person, with common symptoms including nighttime awakening and decreased ability to participate in normal activities as a result of shortness of breath. It is often triggered by changes in weather, changes in the season, changes in air temperature, or inside (home, school, daycare or work) allergens such as animal dander, mold, mildew, woodstoves or cockroaches.   It can also be triggered by hormonal changes, extreme emotion, physical exertion or an upper respiratory tract illness.     It is important to identify the trigger, and then eliminate or avoid the trigger if possible.   If you have been prescribed medications to be taken on a regular basis, it is important to follow the asthma action plan and to follow guidelines to adjust medication in response to increasing symptoms of decreased peak expiratory flow rate   HOME CARE Only take medications as instructed by your medical team. Consider wearing a mask or scarf to improve breathing air temperature have been shown to decrease irritation and decrease exacerbations Get rest. Taking a steamy shower or using a humidifier may help nasal congestion sand ease sore throat pain. You can place a towel over your head and breathe in the steam from hot water coming from a faucet. Using a saline nasal spray works much the same way.  Cough drops, hare candies and sore throat lozenges may ease your cough.  Avoid close contacts  especially the very you and the elderly Cover your mouth if you cough or sneeze Always remember to wash your hands.    GET HELP RIGHT AWAY IF: You develop worsening symptoms; breathlessness at rest, drowsy, confused or agitated, unable to speak in full sentences You have coughing fits You develop a severe headache or visual changes You develop shortness of breath, difficulty breathing or start having chest pain Your symptoms persist after you have completed your treatment plan If your symptoms do not improve within 10 days  MAKE SURE YOU Understand these instructions. Will watch your condition. Will get help right away if you are not doing well or get worse.   Your e-visit answers were reviewed by a board certified advanced clinical practitioner to complete your personal care plan, Depending upon the condition, your plan could have included both over the counter or prescription medications.   Please review your pharmacy choice. Your safety is important to [place]. If you have drug allergies check your prescription carefully.  You can use MyChart to ask questions about today's visit, request a non-urgent  call back, or ask for a work or school excuse for 24 hours related to this e-Visit. If it has been greater than 24 hours you will need to follow up with your provider, or enter a new e-Visit to address those concerns.   You will get an e-mail in the next two days asking about your experience. I hope that your e-visit has been valuable and will speed your recovery. Thank you for using e-visits.

## 2022-09-20 NOTE — Progress Notes (Signed)
I have spent 5 minutes in review of e-visit questionnaire, review and updating patient chart, medical decision making and response to patient.  ° °Selma Mink W Nevah Dalal, NP ° °  °

## 2022-10-02 ENCOUNTER — Other Ambulatory Visit (HOSPITAL_COMMUNITY): Payer: Self-pay

## 2022-10-06 ENCOUNTER — Other Ambulatory Visit: Payer: Self-pay

## 2022-10-08 ENCOUNTER — Telehealth: Payer: Commercial Managed Care - PPO | Admitting: Nurse Practitioner

## 2022-10-08 DIAGNOSIS — H60503 Unspecified acute noninfective otitis externa, bilateral: Secondary | ICD-10-CM

## 2022-10-08 MED ORDER — CIPROFLOXACIN-DEXAMETHASONE 0.3-0.1 % OT SUSP: 4.0000 [drp] | Freq: Two times a day (BID) | OTIC | 0 refills | Status: AC

## 2022-10-08 NOTE — Progress Notes (Signed)
E-Visit for Ear Pain - Acute Otitis Media   We are sorry that you are not feeling well. Here is how we plan to help!  Based on what you have shared with me it looks like you have Acute Otitis Media.  Acute Otitis Media is an infection of the middle or "inner" ear. This type of infection can cause redness, inflammation, and fluid buildup behind the tympanic membrane (ear drum).  The usual symptoms include: Earache/Pain Fever Upper respiratory symptoms Lack of energy/Fatigue/Malaise Slight hearing loss gradually worsening- if the inner ear fills with fluid What causes middle ear infections? Most middle ear infections occur when an infection such as a cold, leads to a build-up of mucus in the middle ear and causes the Eustachian tube (a thin tube that runs from the middle ear to the back of the nose) to become swollen or blocked.     If you develop a fever higher than 102, or any significantly worsening symptoms, this could indicate a more serious infection moving to the middle/inner and needs face to face evaluation in an office by a provider.   Given your presentation, and history we will prescribe:  Meds ordered this encounter  Medications   ciprofloxacin-dexamethasone (CIPRODEX) OTIC suspension    Sig: Place 4 drops into both ears 2 (two) times daily for 7 days.    Dispense:  2.8 mL    Refill:  0       Your symptoms should improve over the next 3 days and should resolve in about 7 days. Be sure to complete ALL of the prescription(s) given.  HOME CARE: Wash your hands frequently. If you are prescribed an ear drop, do not place the tip of the bottle on your ear or touch it with your fingers. You can take Acetaminophen 650 mg every 4-6 hours as needed for pain.  If pain is severe or moderate, you can apply a heating pad (set on low) or hot water bottle (wrapped in a towel) to outer ear for 20 minutes.  This will also increase drainage.  GET HELP RIGHT AWAY IF: Fever is over 102.2  degrees. You develop progressive ear pain or hearing loss. Ear symptoms persist longer than 3 days after treatment.  MAKE SURE YOU: Understand these instructions. Will watch your condition. Will get help right away if you are not doing well or get worse.  Thank you for choosing an e-visit.  Your e-visit answers were reviewed by a board certified advanced clinical practitioner to complete your personal care plan. Depending upon the condition, your plan could have included both over the counter or prescription medications.  Please review your pharmacy choice. Make sure the pharmacy is open so you can pick up the prescription now. If there is a problem, you may contact your provider through [employer] and have the prescription routed to another pharmacy.  Your safety is important to [place]. If you have drug allergies check your prescription carefully.   For the next 24 hours you can use MyChart to ask questions about today's visit, request a non-urgent call back, or ask for a work or school excuse. You will get an email with a survey after your eVisit asking about your experience. We would appreciate your feedback. I hope that your e-visit has been valuable and will aid in your recovery.    I spent approximately 5 minutes reviewing the patient's history, current symptoms and coordinating their care today.

## 2022-10-29 ENCOUNTER — Other Ambulatory Visit (HOSPITAL_COMMUNITY): Payer: Self-pay

## 2022-10-31 ENCOUNTER — Other Ambulatory Visit (HOSPITAL_COMMUNITY): Payer: Self-pay

## 2022-11-03 ENCOUNTER — Other Ambulatory Visit: Payer: Self-pay

## 2022-11-04 ENCOUNTER — Other Ambulatory Visit (HOSPITAL_COMMUNITY): Payer: Self-pay

## 2022-11-07 ENCOUNTER — Ambulatory Visit: Payer: Commercial Managed Care - PPO | Admitting: Emergency Medicine

## 2022-11-24 DIAGNOSIS — S233XXA Sprain of ligaments of thoracic spine, initial encounter: Secondary | ICD-10-CM | POA: Diagnosis not present

## 2022-11-24 DIAGNOSIS — S134XXA Sprain of ligaments of cervical spine, initial encounter: Secondary | ICD-10-CM | POA: Diagnosis not present

## 2022-11-24 DIAGNOSIS — S338XXA Sprain of other parts of lumbar spine and pelvis, initial encounter: Secondary | ICD-10-CM | POA: Diagnosis not present

## 2022-11-27 ENCOUNTER — Other Ambulatory Visit: Payer: Self-pay | Admitting: Emergency Medicine

## 2022-11-27 ENCOUNTER — Other Ambulatory Visit (HOSPITAL_COMMUNITY): Payer: Self-pay

## 2022-11-27 DIAGNOSIS — J4551 Severe persistent asthma with (acute) exacerbation: Secondary | ICD-10-CM

## 2022-11-28 ENCOUNTER — Other Ambulatory Visit: Payer: Self-pay | Admitting: Pharmacist

## 2022-11-28 ENCOUNTER — Other Ambulatory Visit: Payer: Self-pay

## 2022-11-28 ENCOUNTER — Other Ambulatory Visit (HOSPITAL_COMMUNITY): Payer: Self-pay

## 2022-11-28 DIAGNOSIS — J4551 Severe persistent asthma with (acute) exacerbation: Secondary | ICD-10-CM

## 2022-11-28 MED ORDER — TEZSPIRE 210 MG/1.91ML ~~LOC~~ SOAJ
210.0000 mg | SUBCUTANEOUS | 3 refills | Status: DC
Start: 2022-11-28 — End: 2022-11-28
  Filled 2022-11-28: qty 1.91, 28d supply, fill #0

## 2022-11-28 MED ORDER — TEZSPIRE 210 MG/1.91ML ~~LOC~~ SOAJ
210.0000 mg | SUBCUTANEOUS | 3 refills | Status: DC
Start: 2022-11-28 — End: 2023-04-30
  Filled 2022-11-28: qty 1.91, 28d supply, fill #0
  Filled 2022-12-26: qty 1.91, 28d supply, fill #1
  Filled 2023-01-21: qty 1.91, 28d supply, fill #2
  Filled 2023-02-23 – 2023-02-26 (×2): qty 1.91, 28d supply, fill #3

## 2022-12-02 ENCOUNTER — Other Ambulatory Visit: Payer: Self-pay

## 2022-12-05 ENCOUNTER — Ambulatory Visit: Payer: Commercial Managed Care - PPO | Admitting: Emergency Medicine

## 2022-12-17 ENCOUNTER — Encounter: Payer: Self-pay | Admitting: Internal Medicine

## 2022-12-17 ENCOUNTER — Telehealth: Payer: Commercial Managed Care - PPO | Admitting: Physician Assistant

## 2022-12-17 ENCOUNTER — Ambulatory Visit (INDEPENDENT_AMBULATORY_CARE_PROVIDER_SITE_OTHER): Payer: Commercial Managed Care - PPO | Admitting: Internal Medicine

## 2022-12-17 VITALS — BP 118/84 | HR 90 | Temp 98.2°F | Resp 16 | Ht 70.0 in | Wt 256.0 lb

## 2022-12-17 DIAGNOSIS — K219 Gastro-esophageal reflux disease without esophagitis: Secondary | ICD-10-CM

## 2022-12-17 DIAGNOSIS — E282 Polycystic ovarian syndrome: Secondary | ICD-10-CM

## 2022-12-17 DIAGNOSIS — Z0000 Encounter for general adult medical examination without abnormal findings: Secondary | ICD-10-CM

## 2022-12-17 DIAGNOSIS — J4542 Moderate persistent asthma with status asthmaticus: Secondary | ICD-10-CM

## 2022-12-17 DIAGNOSIS — B001 Herpesviral vesicular dermatitis: Secondary | ICD-10-CM | POA: Diagnosis not present

## 2022-12-17 DIAGNOSIS — Z6836 Body mass index (BMI) 36.0-36.9, adult: Secondary | ICD-10-CM

## 2022-12-17 DIAGNOSIS — Z0001 Encounter for general adult medical examination with abnormal findings: Secondary | ICD-10-CM

## 2022-12-17 DIAGNOSIS — Z124 Encounter for screening for malignant neoplasm of cervix: Secondary | ICD-10-CM | POA: Insufficient documentation

## 2022-12-17 LAB — CBC WITH DIFFERENTIAL/PLATELET
Basophils Absolute: 0 10*3/uL (ref 0.0–0.1)
Basophils Relative: 0.5 % (ref 0.0–3.0)
Eosinophils Absolute: 0.1 10*3/uL (ref 0.0–0.7)
Eosinophils Relative: 1 % (ref 0.0–5.0)
HCT: 44 % (ref 36.0–46.0)
Hemoglobin: 14.7 g/dL (ref 12.0–15.0)
Lymphocytes Relative: 33.1 % (ref 12.0–46.0)
Lymphs Abs: 2 10*3/uL (ref 0.7–4.0)
MCHC: 33.4 g/dL (ref 30.0–36.0)
MCV: 85.5 fl (ref 78.0–100.0)
Monocytes Absolute: 0.6 10*3/uL (ref 0.1–1.0)
Monocytes Relative: 9.6 % (ref 3.0–12.0)
Neutro Abs: 3.4 10*3/uL (ref 1.4–7.7)
Neutrophils Relative %: 55.8 % (ref 43.0–77.0)
Platelets: 243 10*3/uL (ref 150.0–400.0)
RBC: 5.15 Mil/uL — ABNORMAL HIGH (ref 3.87–5.11)
RDW: 13.3 % (ref 11.5–15.5)
WBC: 6 10*3/uL (ref 4.0–10.5)

## 2022-12-17 LAB — HEPATIC FUNCTION PANEL
ALT: 16 U/L (ref 0–35)
AST: 11 U/L (ref 0–37)
Albumin: 4.4 g/dL (ref 3.5–5.2)
Alkaline Phosphatase: 71 U/L (ref 39–117)
Bilirubin, Direct: 0.1 mg/dL (ref 0.0–0.3)
Total Bilirubin: 0.6 mg/dL (ref 0.2–1.2)
Total Protein: 7.3 g/dL (ref 6.0–8.3)

## 2022-12-17 LAB — TSH: TSH: 0.88 u[IU]/mL (ref 0.35–5.50)

## 2022-12-17 LAB — HEMOGLOBIN A1C: Hgb A1c MFr Bld: 5.5 % (ref 4.6–6.5)

## 2022-12-17 LAB — HCG, QUANTITATIVE, PREGNANCY: Quantitative HCG: 1.03 m[IU]/mL

## 2022-12-17 MED ORDER — AIRSUPRA 90-80 MCG/ACT IN AERO
2.0000 | INHALATION_SPRAY | Freq: Four times a day (QID) | RESPIRATORY_TRACT | 1 refills | Status: DC | PRN
Start: 2022-12-17 — End: 2024-01-20
  Filled 2022-12-17: qty 32.1, 90d supply, fill #0

## 2022-12-17 MED ORDER — PHEXXI 1.8-1-0.4 % VA GEL
1.0000 | VAGINAL | 11 refills | Status: DC | PRN
Start: 2022-12-17 — End: 2023-12-23

## 2022-12-17 MED ORDER — ZEPBOUND 2.5 MG/0.5ML ~~LOC~~ SOAJ
2.5000 mg | SUBCUTANEOUS | 0 refills | Status: DC
Start: 2022-12-17 — End: 2023-03-04
  Filled 2022-12-17: qty 2, 28d supply, fill #0
  Filled 2022-12-23: qty 4, 56d supply, fill #0

## 2022-12-17 NOTE — Patient Instructions (Signed)

## 2022-12-17 NOTE — Progress Notes (Signed)
Subjective:  Patient ID: Tammy Chapman, female    DOB: May 17, 1991  Age: 32 y.o. MRN: [id number]  CC: Annual Exam and Asthma   HPI Tammy Chapman presents for a CPX and f/up ---  She has rare wheezing.  She is active and denies chest pain, shortness of breath, diaphoresis, or edema.  Outpatient Medications Prior to Visit  Medication Sig Dispense Refill   albuterol (PROVENTIL) (2.5 MG/3ML) 0.083% nebulizer solution Inhale 1 vial (2.5 mg total) by nebulization every 6 (six) hours as needed for wheezing or shortness of breath. 75 mL 12   aspirin EC 81 MG tablet Take 81 mg by mouth daily. Swallow whole.     Azelaic Acid (FINACEA) 15 % gel Apply topically to face up to twice daily, 50 g PRN   budesonide-formoterol (SYMBICORT) 160-4.5 MCG/ACT inhaler INHALE 2 PUFFS BY MOUTH 2 TIMES DAILY 10.2 g 11   doxycycline (VIBRA-TABS) 100 MG tablet Take 1 tablet (100 mg total) by mouth 2 (two) times daily. 20 tablet 0   levocetirizine (XYZAL) 5 MG tablet TAKE 1 TABLET BY MOUTH EVERY EVENING 30 tablet 10   metroNIDAZOLE (METROCREAM) 0.75 % cream Apply topically to face up to 2 times a day 45 g 12   montelukast (SINGULAIR) 10 MG tablet TAKE 1 TABLET BY MOUTH AT BEDTIME 30 tablet 10   Tezepelumab-ekko (TEZSPIRE) 210 MG/1. SOAJ Inject 210 mg into the skin every 28 (twenty-eight) days. 1.91 mL 3   Tiotropium Bromide Monohydrate (SPIRIVA RESPIMAT) 1.25 MCG/ACT AERS Inhale 2 puffs into the lungs daily. 4 g 6   albuterol (VENTOLIN HFA) 108 (90 Base) MCG/ACT inhaler Inhale 2 puffs into the lungs every 6 (six) hours as needed for wheezing or shortness of breath. 18 g 2   benzonatate (TESSALON) 200 MG capsule Take 1 capsule (200 mg total) by mouth 3 (three) times daily as needed. 30 capsule 1   metFORMIN (GLUCOPHAGE XR) 750 MG 24 hr tablet Take 1 tablet by mouth daily with breakfast. 90 tablet 1   ondansetron (ZOFRAN-ODT) 4 MG disintegrating tablet Take 1 tablet (4 mg total) by mouth every 8 (eight) hours as needed  for nausea or vomiting. 20 tablet 0   PHEXXI 1.8-1-0.4 % GEL PLACE 1 APPLICATORFUL VAGINALLY AS NEEDED. IMMEDIATELY BEFORE OR UP TO 1 HOUR BEFORE SEX 60 g 11   promethazine-dextromethorphan (PROMETHAZINE-DM) 6.25-15 MG/5ML syrup Take 5 mLs by mouth 4 (four) times daily as needed. 118 mL 0   budesonide-formoterol (SYMBICORT) 160-4.5 MCG/ACT inhaler INHALE 2 PUFFS BY MOUTH 2 TIMES DAILY 10.2 g 11   medroxyPROGESTERone (PROVERA) 10 MG tablet TAKE 1 TABLET BY MOUTH ONCE A DAY FOR 2 WEEKS, REPEAT AS DIRECTED 14 tablet 3   No facility-administered medications prior to visit.    ROS Review of Systems  Constitutional:  Positive for unexpected weight change (wt gain). Negative for appetite change, diaphoresis and fatigue.  HENT: Negative.    Eyes: Negative.   Respiratory:  Positive for wheezing. Negative for cough, chest tightness and shortness of breath.   Cardiovascular:  Negative for chest pain, palpitations and leg swelling.  Gastrointestinal:  Positive for constipation and diarrhea. Negative for abdominal pain, nausea and vomiting.  Endocrine: Negative.   Genitourinary: Negative.  Negative for difficulty urinating.  Musculoskeletal: Negative.   Skin: Negative.   Neurological:  Negative for dizziness, weakness and light-headedness.  Hematological:  Negative for adenopathy. Does not bruise/bleed easily.  Psychiatric/Behavioral: Negative.      Objective:  BP 118/84 (BP Location: Right Arm,  Patient Position: Sitting, Cuff Size: Large)   Pulse 90   Temp 98.2 F (36.8 C) (Oral)   Resp 16   Ht 5\' 10"  (1.778 m)   Wt 256 lb (116.1 kg)   LMP 12/03/2022 (Exact Date) Comment: uses Phexxi  SpO2 98%   BMI 36.73 kg/m   BP Readings from Last 3 Encounters:  12/17/22 118/84  09/18/22 130/86  04/08/22 128/74    Wt Readings from Last 3 Encounters:  12/17/22 256 lb (116.1 kg)  04/08/22 241 lb (109.3 kg)  01/28/22 238 lb (108 kg)    Physical Exam Vitals reviewed.  Constitutional:       Appearance: Normal appearance.  HENT:     Nose: Nose normal.     Mouth/Throat:     Mouth: Mucous membranes are moist.  Eyes:     General: No scleral icterus.    Conjunctiva/sclera: Conjunctivae normal.  Cardiovascular:     Rate and Rhythm: Normal rate and regular rhythm.     Heart sounds: No murmur heard. Pulmonary:     Effort: Pulmonary effort is normal.     Breath sounds: No stridor. No wheezing, rhonchi or rales.  Abdominal:     General: Abdomen is protuberant. Bowel sounds are normal. There is no distension.     Palpations: Abdomen is soft. There is no hepatomegaly, splenomegaly or mass.     Tenderness: There is no abdominal tenderness.     Hernia: No hernia is present.  Musculoskeletal:        General: Normal range of motion.     Cervical back: Neck supple.     Right lower leg: No edema.     Left lower leg: No edema.  Lymphadenopathy:     Cervical: No cervical adenopathy.  Skin:    General: Skin is warm and dry.  Neurological:     General: No focal deficit present.     Mental Status: She is alert. Mental status is at baseline.  Psychiatric:        Mood and Affect: Mood normal.        Behavior: Behavior normal.     Lab Results  Component Value Date   WBC 6.0 12/17/2022   HGB 14.7 12/17/2022   HCT 44.0 12/17/2022   PLT 243.0 12/17/2022   GLUCOSE 93 09/25/2020   CHOL 201 (H) 02/27/2021   TRIG 89.0 02/27/2021   HDL 53.30 02/27/2021   LDLCALC 129 (H) 02/27/2021   ALT 16 12/17/2022   AST 11 12/17/2022   NA 136 09/25/2020   K 3.7 09/25/2020   CL 101 09/25/2020   CREATININE 0.75 09/25/2020   BUN 9 09/25/2020   CO2 29 09/25/2020   TSH 0.88 12/17/2022   HGBA1C 5.5 12/17/2022    No results found.  Assessment & Plan:   PCOS (polycystic ovarian syndrome) -     Phexxi; Place 1 Applicatorful vaginally as needed. Immediately before or up to 1 hour before sex  Dispense: 60 g; Refill: 11 -     Hemoglobin A1c; Future -     hCG, quantitative, pregnancy;  Future  Class 2 severe obesity due to excess calories with serious comorbidity and body mass index (BMI) of 36.0 to 36.9 in adult [facility])- Will start a GLP/GIP agonist. -     Hemoglobin A1c; Future -     hCG, quantitative, pregnancy; Future -     Zepbound; Inject 2.5 mg into the skin once a week.  Dispense: 4 mL; Refill: 0  Gastroesophageal reflux  disease without esophagitis- Her symptoms are well-controlled.  She has alternating constipation and diarrhea consistent with her history of IBS. -     CBC with Differential/Platelet; Future -     Hepatic function panel; Future -     TSH; Future -     hCG, quantitative, pregnancy; Future  Moderate persistent asthma with status asthmaticus -     CBC with Differential/Platelet; Future -     hCG, quantitative, pregnancy; Future -     Airsupra; Inhale 2 puffs into the lungs 4 (four) times daily as needed.  Dispense: 32.1 g; Refill: 1  Encounter for general adult medical examination with abnormal findings- Exam completed, labs reviewed, vaccines reviewed, cancer screenings addressed, patient education was given.  Cervical cancer screening -     Ambulatory referral to Gynecology     Follow-up: Return in about 6 months (around 06/19/2023).  Sanda Linger, MD

## 2022-12-18 ENCOUNTER — Other Ambulatory Visit: Payer: Self-pay

## 2022-12-18 ENCOUNTER — Other Ambulatory Visit (HOSPITAL_COMMUNITY): Payer: Self-pay

## 2022-12-18 ENCOUNTER — Encounter: Payer: Self-pay | Admitting: Internal Medicine

## 2022-12-18 MED ORDER — VALACYCLOVIR HCL 1 G PO TABS
2000.0000 mg | ORAL_TABLET | Freq: Two times a day (BID) | ORAL | 0 refills | Status: AC
Start: 2022-12-18 — End: 2022-12-19

## 2022-12-18 NOTE — Progress Notes (Signed)
We are sorry that you are not feeling well.  Here is how we plan to help!  Based on what you have shared with me it does look like you have a viral infection.    Most cold sores or fever blisters are small fluid filled blisters around the mouth caused by herpes simplex virus.  The most common strain of the virus causing cold sores is herpes simplex virus 1.  It can be spread by skin contact, sharing eating utensils, or even sharing towels.  Cold sores are contagious to other people until dry. (Approximately 5-7 days).  Wash your hands. You can spread the virus to your eyes through handling your contact lenses after touching the lesions.  Most people experience pain at the sight or tingling sensations in their lips that may begin before the ulcers erupt.  Herpes simplex is treatable but not curable.  It may lie dormant for a long time and then reappear due to stress or prolonged sun exposure.  Many patients have success in treating their cold sores with an over the counter topical called Abreva.  You may apply the cream up to 5 times daily (maximum 10 days) until healing occurs.  If you would like to use an oral antiviral medication to speed the healing of your cold sore, I have sent a prescription to your local pharmacy Valacyclovir 2 gm take one by mouth twice a day for 1 day    HOME CARE:  Wash your hands frequently. Do not pick at or rub the sore. Don't open the blisters. Avoid kissing other people during this time. Avoid sharing drinking glasses, eating utensils, or razors. Do not handle contact lenses unless you have thoroughly washed your hands with soap and warm water! Avoid oral sex during this time.  Herpes from sores on your mouth can spread to your partner's genital area. Avoid contact with anyone who has eczema or a weakened immune system. Cold sores are often triggered by exposure to intense sunlight, use a lip balm containing a sunscreen (SPF 30 or higher).  GET HELP RIGHT AWAY  IF:  Blisters look infected. Blisters occur near or in the eye. Symptoms last longer than 10 days. Your symptoms become worse.  MAKE SURE YOU:  Understand these instructions. Will watch your condition. Will get help right away if you are not doing well or get worse.    Your e-visit answers were reviewed by a board certified advanced clinical practitioner to complete your personal care plan.  Depending upon the condition, your plan could have  Included both over the counter or prescription medications.    Please review your pharmacy choice.  Be sure that the pharmacy you have chosen is open so that you can pick up your prescription now.  If there is a problem you can message your provider in MyChart to have the prescription routed to another pharmacy.    Your safety is important to us.  If you have drug allergies check our prescription carefully.  For the next 24 hours you can use MyChart to ask questions about today's visit, request a non-urgent call back, or ask for a work or school excuse from your e-visit provider.  You will get an email in the next two days asking about your experience.  I hope that your e-visit has been valuable and will speed your recovery.  I have spent 5 minutes in review of e-visit questionnaire, review and updating patient chart, medical decision making and response to patient.     Laiden Milles M Lindy Pennisi, PA-C  

## 2022-12-19 ENCOUNTER — Other Ambulatory Visit (HOSPITAL_COMMUNITY): Payer: Self-pay

## 2022-12-19 ENCOUNTER — Telehealth: Payer: Self-pay

## 2022-12-19 NOTE — Telephone Encounter (Signed)
PA request for Zepbound 2.5MG /0.5ML pen-injectors received via provider.  PA submitted to [employer] and denied due to:

## 2022-12-19 NOTE — Telephone Encounter (Signed)
See additional encounter created, medication is not covered.

## 2022-12-20 ENCOUNTER — Other Ambulatory Visit (HOSPITAL_COMMUNITY): Payer: Self-pay

## 2022-12-23 ENCOUNTER — Other Ambulatory Visit (HOSPITAL_COMMUNITY): Payer: Self-pay

## 2022-12-23 ENCOUNTER — Other Ambulatory Visit: Payer: Self-pay

## 2022-12-24 ENCOUNTER — Ambulatory Visit: Payer: Commercial Managed Care - PPO | Attending: Internal Medicine | Admitting: Pharmacist

## 2022-12-24 DIAGNOSIS — Z7189 Other specified counseling: Secondary | ICD-10-CM

## 2022-12-24 NOTE — Progress Notes (Signed)
HPI Patient presents today to [facility] to see pharmacy team for [facility] new start.  Past medical history includes moderate persistent asthma. Pt is managed by Dr. [name] for this.  Number of hospitalizations in past year: 0 Number of COPD/asthma exacerbations in past year: 0  CHEP Medications Current regimen: Tezspire 210 mg q28days Patient reports no known adherence challenges.  OBJECTIVE Allergies  Allergen Reactions   Benadryl [Diphenhydramine Hcl] Shortness Of Breath    Pt states that she feels fine if she sits down, but if she is moving around after taking benadryl, she feels SOB   Oxycodone-Acetaminophen Hives   Penicillins Shortness Of Breath    Has patient had a PCN reaction causing immediate rash, facial/tongue/throat swelling, SOB or lightheadedness with hypotension: yes Has patient had a PCN reaction causing severe rash involving mucus membranes or skin necrosis: no Has patient had a PCN reaction that required hospitalization: no Has patient had a PCN reaction occurring within the last 10 years: no If all of the above answers are "NO", then may proceed with Cephalosporin use. Patient reports she CAN TAKE kEFLEX AND SUPRAX   Adhesive [Tape] Hives   Lactose Intolerance (Gi) Diarrhea and Nausea And Vomiting   Latex Rash   Omnipaque [Iohexol] Itching    Slight itching in mouth and tongue. No swelling, wheezing, or hives.   Sulfa Antibiotics Rash   Ultram [Tramadol Hcl] Rash    Outpatient Encounter Medications as of 12/24/2022  Medication Sig   albuterol (PROVENTIL) (2.5 MG/3ML) 0.083% nebulizer solution Inhale 1 vial (2.5 mg total) by nebulization every 6 (six) hours as needed for wheezing or shortness of breath.   Albuterol-Budesonide (AIRSUPRA) 90-80 MCG/ACT AERO Inhale 2 puffs into the lungs 4 (four) times daily as needed.   aspirin EC 81 MG tablet Take 81 mg by mouth daily. Swallow whole.   Azelaic Acid (FINACEA) 15 % gel Apply topically to face up to twice  daily,   budesonide-formoterol (SYMBICORT) 160-4.5 MCG/ACT inhaler INHALE 2 PUFFS BY MOUTH 2 TIMES DAILY   doxycycline (VIBRA-TABS) 100 MG tablet Take 1 tablet (100 mg total) by mouth 2 (two) times daily.   Lactic Ac-Citric Ac-Pot Bitart (PHEXXI) 1.8-1-0.4 % GEL Place 1 Applicatorful vaginally as needed. Immediately before or up to 1 hour before sex   levocetirizine (XYZAL) 5 MG tablet TAKE 1 TABLET BY MOUTH EVERY EVENING   metroNIDAZOLE (METROCREAM) 0.75 % cream Apply topically to face up to 2 times a day   montelukast (SINGULAIR) 10 MG tablet TAKE 1 TABLET BY MOUTH AT BEDTIME   Tezepelumab-ekko (TEZSPIRE) 210 MG/1. SOAJ Inject 210 mg into the skin every 28 (twenty-eight) days.   Tiotropium Bromide Monohydrate (SPIRIVA RESPIMAT) 1.25 MCG/ACT AERS Inhale 2 puffs into the lungs daily.   tirzepatide (ZEPBOUND) 2.5 MG/0.5ML Pen Inject 2.5 mg into the skin once a week.   No facility-administered encounter medications on file as of 12/24/2022.     Immunization History  Administered Date(s) Administered   Hepatitis B 01/02/2012   Influenza,inj,Quad PF,6+ Mos 05/07/2019   Influenza-Unspecified 05/07/2019, 05/21/2020, 05/08/2021   PNEUMOCOCCAL CONJUGATE-20 02/27/2021   Pneumococcal Polysaccharide-23 07/03/2019   Tdap 04/13/2019     PFTs    Latest Ref Rng & Units 01/25/2021    3:46 PM 01/20/2018    9:06 AM  PFT Results  FVC-Pre L 4.75  4.68   FVC-Predicted Pre % 104  102   FVC-Post L 4.91  4.74   FVC-Predicted Post % 107  104   Pre  FEV1/FVC % % 85  84   Post FEV1/FCV % % 86  86   FEV1-Pre L 4.05  3.94   FEV1-Predicted Pre % 106  102   FEV1-Post L 4.23  4.06   DLCO uncorrected ml/min/mmHg 29.18    DLCO UNC% % 107    DLCO corrected ml/min/mmHg 29.18    DLCO COR %Predicted % 107    DLVA Predicted % 110    TLC L 6.90    TLC % Predicted % 115    RV % Predicted % 130       Eosinophils Most recent blood eosinophil count was 0.1 K/microL taken on 10/24/2021.   IgE: was elevated on  10/24/2021.   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 This blocks TSLP from its effect on inflammation including reduce eosinophils, IgE, FeNO, IL-5, and IL-13. Mechanism is not definitively established. Reviewed that Dorothea Ogle is add-on medication and patient must continue maintenance inhaler regimen. Response to therapy: may take 3-4 months to determine efficacy.  Side effects: injection site reaction (6-18%), antibody development (2%), arthralgia (4%), back pain (4%), pharyngitis (4%)  Dose: Tezspire 210 mg once every 4 weeks  Administration/Storage:  Reviewed administration sites of thigh or abdomen (at least 2-3 inches away from abdomen). Reviewed the upper arm is only appropriate if caregiver is administering injection  Do not shake pen/syringe as this could lead to product foaming or precipitation. Do not shake syringe as this could lead to product foaming or precipitation.  Access: Approval of Tezspire through: insurance Patient enrolled into copay card program to help with copay assistance.   Medication Reconciliation  A drug regimen assessment was performed, including review of allergies, interactions, disease-state management, dosing and immunization history. Medications were reviewed with the patient, including name, instructions, indication, goals of therapy, potential side effects, importance of adherence, and safe use.  Drug interaction(s): none identified.   PLAN Continue Tezspire 210mg  SQ every 28 days.   All questions encouraged and answered.  Instructed patient to reach out with any further questions or concerns.  Thank you for allowing pharmacy to participate in this patient's care.  This appointment required 15 minutes of patient care (this includes precharting, chart review, review of results, face-to-face care, etc.).

## 2022-12-25 ENCOUNTER — Other Ambulatory Visit: Payer: Self-pay | Admitting: Internal Medicine

## 2022-12-25 DIAGNOSIS — Z6836 Body mass index (BMI) 36.0-36.9, adult: Secondary | ICD-10-CM

## 2022-12-26 ENCOUNTER — Other Ambulatory Visit (HOSPITAL_COMMUNITY): Payer: Self-pay

## 2022-12-29 ENCOUNTER — Other Ambulatory Visit (HOSPITAL_COMMUNITY): Payer: Self-pay

## 2023-01-15 ENCOUNTER — Ambulatory Visit: Payer: Commercial Managed Care - PPO | Admitting: Emergency Medicine

## 2023-01-21 ENCOUNTER — Other Ambulatory Visit (HOSPITAL_COMMUNITY): Payer: Self-pay

## 2023-02-16 ENCOUNTER — Encounter (INDEPENDENT_AMBULATORY_CARE_PROVIDER_SITE_OTHER): Payer: Self-pay | Admitting: Physician Assistant

## 2023-02-16 ENCOUNTER — Telehealth: Payer: Commercial Managed Care - PPO | Admitting: Physician Assistant

## 2023-02-16 ENCOUNTER — Ambulatory Visit (INDEPENDENT_AMBULATORY_CARE_PROVIDER_SITE_OTHER): Payer: Commercial Managed Care - PPO | Admitting: Physician Assistant

## 2023-02-16 VITALS — BP 111/76 | HR 81 | Temp 98.9°F | Ht 69.0 in | Wt 247.0 lb

## 2023-02-16 DIAGNOSIS — Z0289 Encounter for other administrative examinations: Secondary | ICD-10-CM

## 2023-02-16 DIAGNOSIS — E6609 Other obesity due to excess calories: Secondary | ICD-10-CM | POA: Diagnosis not present

## 2023-02-16 DIAGNOSIS — Q796 Ehlers-Danlos syndrome, unspecified: Secondary | ICD-10-CM | POA: Diagnosis not present

## 2023-02-16 DIAGNOSIS — Z6836 Body mass index (BMI) 36.0-36.9, adult: Secondary | ICD-10-CM | POA: Diagnosis not present

## 2023-02-16 DIAGNOSIS — E282 Polycystic ovarian syndrome: Secondary | ICD-10-CM

## 2023-02-16 DIAGNOSIS — B3731 Acute candidiasis of vulva and vagina: Secondary | ICD-10-CM

## 2023-02-16 MED ORDER — FLUCONAZOLE 150 MG PO TABS
150.0000 mg | ORAL_TABLET | ORAL | 0 refills | Status: DC | PRN
Start: 2023-02-16 — End: 2023-03-04

## 2023-02-16 NOTE — Progress Notes (Signed)
Office: [phone]  /  Fax: [phone]   Initial Visit  Tammy Chapman was seen in clinic today to evaluate for obesity. She is interested in losing weight to improve overall health and reduce the risk of weight related complications. She presents today to review program treatment options, initial physical assessment, and evaluation.     She was referred by: PCP  When asked what else they would like to accomplish? She states: Adopt healthier eating patterns, Improve energy levels and physical activity, Improve existing medical conditions, Improve quality of life, Improve appearance, Improve self-confidence, and Lose a target amount of weight : 100 lbs, but mostly just want to be healthy.   Weight history: Hx of PCOS with weight gain/heavy most of adult life. Lost 80 lbs after first child was born. Then had "surprise" 2nd child and has had difficulty losing weight following this pregnancy.   When asked how has your weight affected you? She states: Has affected self-esteem, Relationships, Contributed to orthopedic problems or mobility issues, Having fatigue, and Having poor endurance  Some associated conditions: PCOS and Connective tissue disease: Ehlers Danlos  Contributing factors: Family history, Nutritional, Medications, Reduced physical activity, Eating patterns, and Pregnancy  Weight promoting medications identified: Contraceptives or hormonal therapy  Current nutrition plan: None  Current level of physical activity: None and Other: Ehlers Danlos  Current or previous pharmacotherapy: Metformin  Response to medication: Ineffective so it was discontinued   Past medical history includes:   Past Medical History:  Diagnosis Date   Allergy    Asthma    Bronchitis, mucopurulent recurrent (HCC) 01/19/2019   Chronic asthma 01/19/2019   Loose body of right knee 02/2013   Pregnancy induced hypertension    Recurrent otitis media of both ears 01/19/2019   Sprain and strain of  medial collateral ligament of knee 02/2013   right     Objective:   BP 111/76   Pulse 81   Temp 98.9 F (37.2 C)   Ht 5\' 9"  (1.753 m)   Wt 247 lb (112 kg)   SpO2 97%   BMI 36.48 kg/m  She was weighed on the bioimpedance scale: Body mass index is 36.48 kg/m.  Peak Weight:260 lbs , Body Fat%:44.1%, Visceral Fat Rating:10, Weight trend over the last 12 months: Increasing  General:  Alert, oriented and cooperative. Patient is in no acute distress.  Respiratory: Normal respiratory effort, no problems with respiration noted   Gait: able to ambulate independently  Mental Status: Normal mood and affect. Normal behavior. Normal judgment and thought content.   DIAGNOSTIC DATA REVIEWED:  BMET    Component Value Date/Time   NA 136 09/25/2020 1514   NA 137 02/04/2019 0959   K 3.7 09/25/2020 1514   CL 101 09/25/2020 1514   CO2 29 09/25/2020 1514   GLUCOSE 93 09/25/2020 1514   BUN 9 09/25/2020 1514   BUN 10 02/04/2019 0959   CREATININE 0.75 09/25/2020 1514   CREATININE 0.54 01/19/2019 1012   CALCIUM 9.4 09/25/2020 1514   GFRNONAA >60 07/02/2019 0602   GFRNONAA 130 01/19/2019 1012   GFRAA >60 07/02/2019 0602   GFRAA 151 01/19/2019 1012   Lab Results  Component Value Date   HGBA1C 5.5 12/17/2022   No results found for: "INSULIN" CBC    Component Value Date/Time   WBC 6.0 12/17/2022 1513   RBC 5.15 (H) 12/17/2022 1513   HGB 14.7 12/17/2022 1513   HGB 12.0 04/13/2019 0852   HCT 44.0 12/17/2022 1513   HCT  36.2 04/13/2019 0852   PLT 243.0 12/17/2022 1513   PLT 171 04/13/2019 0852   MCV 85.5 12/17/2022 1513   MCV 84 04/13/2019 0852   MCH 24.2 (L) 07/02/2019 0602   MCHC 33.4 12/17/2022 1513   RDW 13.3 12/17/2022 1513   RDW 12.0 04/13/2019 0852   Iron/TIBC/Ferritin/ %Sat No results found for: "IRON", "TIBC", "FERRITIN", "IRONPCTSAT" Lipid Panel     Component Value Date/Time   CHOL 201 (H) 02/27/2021 1049   TRIG 89.0 02/27/2021 1049   HDL 53.30 02/27/2021 1049    CHOLHDL 4 02/27/2021 1049   VLDL 17.8 02/27/2021 1049   LDLCALC 129 (H) 02/27/2021 1049   Hepatic Function Panel     Component Value Date/Time   PROT 7.3 12/17/2022 1513   PROT 6.7 02/04/2019 0959   ALBUMIN 4.4 12/17/2022 1513   ALBUMIN 4.2 02/04/2019 0959   AST 11 12/17/2022 1513   ALT 16 12/17/2022 1513   ALKPHOS 71 12/17/2022 1513   BILITOT 0.6 12/17/2022 1513   BILITOT 0.6 02/04/2019 0959   BILIDIR 0.1 12/17/2022 1513      Component Value Date/Time   TSH 0.88 12/17/2022 1513     Assessment and Plan:   Class 2 obesity due to excess calories without serious comorbidity with body mass index (BMI) of 36.0 to 36.9 in adult  PCOS (polycystic ovarian syndrome)  Ehlers-Danlos disease      Obesity Treatment / Action Plan:  Patient will work on garnering support from family and friends to begin weight loss journey. Will work on eliminating or reducing the presence of highly palatable, calorie dense foods in the home. Will complete provided nutritional and psychosocial assessment questionnaire before the next appointment. Will be scheduled for indirect calorimetry to determine resting energy expenditure in a fasting state.  This will allow [place] to create a reduced calorie, high-protein meal plan to promote loss of fat mass while preserving muscle mass. Counseled on the health benefits of losing 5%-15% of total body weight. Was counseled on nutritional approaches to weight loss and benefits of reducing processed foods and consuming plant-based foods and high quality protein as part of nutritional weight management. Was counseled on pharmacotherapy and role as an adjunct in weight management.   Obesity Education Performed Today:  She was weighed on the bioimpedance scale and results were discussed and documented in the synopsis.  We discussed obesity as a disease and the importance of a more detailed evaluation of all the factors contributing to the disease.  We discussed the  importance of long term lifestyle changes which include nutrition, exercise and behavioral modifications as well as the importance of customizing this to her specific health and social needs.  We discussed the benefits of reaching a healthier weight to alleviate the symptoms of existing conditions and reduce the risks of the biomechanical, metabolic and psychological effects of obesity.  Tammy Chapman appears to be in the action stage of change and states they are ready to start intensive lifestyle modifications and behavioral modifications.  30 minutes was spent today on this visit including the above counseling, pre-visit chart review, and post-visit documentation.  Reviewed by clinician on day of visit: allergies, medications, problem list, medical history, surgical history, family history, social history, and previous encounter notes pertinent to obesity diagnosis.   Kaitland Lewellyn,PA-C

## 2023-02-16 NOTE — Progress Notes (Signed)

## 2023-02-20 ENCOUNTER — Other Ambulatory Visit (HOSPITAL_COMMUNITY): Payer: Self-pay

## 2023-02-23 ENCOUNTER — Other Ambulatory Visit (HOSPITAL_COMMUNITY): Payer: Self-pay

## 2023-02-25 ENCOUNTER — Other Ambulatory Visit (HOSPITAL_COMMUNITY): Payer: Self-pay

## 2023-02-26 ENCOUNTER — Other Ambulatory Visit: Payer: Self-pay

## 2023-02-26 ENCOUNTER — Other Ambulatory Visit (HOSPITAL_COMMUNITY): Payer: Self-pay

## 2023-03-04 ENCOUNTER — Encounter (INDEPENDENT_AMBULATORY_CARE_PROVIDER_SITE_OTHER): Payer: Self-pay | Admitting: Internal Medicine

## 2023-03-04 ENCOUNTER — Ambulatory Visit (INDEPENDENT_AMBULATORY_CARE_PROVIDER_SITE_OTHER): Payer: Commercial Managed Care - PPO | Admitting: Internal Medicine

## 2023-03-04 VITALS — BP 103/72 | HR 72 | Temp 98.5°F | Ht 69.0 in | Wt 248.0 lb

## 2023-03-04 DIAGNOSIS — R5383 Other fatigue: Secondary | ICD-10-CM | POA: Diagnosis not present

## 2023-03-04 DIAGNOSIS — Z1331 Encounter for screening for depression: Secondary | ICD-10-CM

## 2023-03-04 DIAGNOSIS — E66812 Obesity, class 2: Secondary | ICD-10-CM

## 2023-03-04 DIAGNOSIS — R0602 Shortness of breath: Secondary | ICD-10-CM | POA: Diagnosis not present

## 2023-03-04 DIAGNOSIS — E282 Polycystic ovarian syndrome: Secondary | ICD-10-CM

## 2023-03-04 DIAGNOSIS — E7800 Pure hypercholesterolemia, unspecified: Secondary | ICD-10-CM | POA: Diagnosis not present

## 2023-03-04 DIAGNOSIS — Z6836 Body mass index (BMI) 36.0-36.9, adult: Secondary | ICD-10-CM

## 2023-03-04 DIAGNOSIS — F32A Depression, unspecified: Secondary | ICD-10-CM | POA: Diagnosis not present

## 2023-03-04 NOTE — Assessment & Plan Note (Signed)
Condition linked to weight and possible insulin resistance.  We will check glycemic parameters and insulin levels.  Losing 10% of body weight may improve condition.  She may also benefit from pharmacoprophylaxis with metformin for inpatient therapy.

## 2023-03-04 NOTE — Progress Notes (Unsigned)
Chief Complaint:   OBESITY Tammy Chapman (MR# [id number]) is a 32 y.o. female who presents for evaluation and treatment of obesity and related comorbidities. Current BMI is Body mass index is 36.62 kg/m. Tammy Chapman has been struggling with her weight for many years and has been unsuccessful in either losing weight, maintaining weight loss, or reaching her healthy weight goal.  Tammy Chapman is currently in the action stage of change and ready to dedicate time achieving and maintaining a healthier weight. Tammy Chapman is interested in becoming our patient and working on intensive lifestyle modifications including (but not limited to) diet and exercise for weight loss.  Tammy Chapman's habits were reviewed today and are as follows: Her family eats meals together, she thinks her family will eat healthier with her, her desired weight loss is 108 lbs, she started gaining weight when her PCOS got really bad in her 20's, her heaviest weight ever was 260 pounds, she is a picky eater and doesn't like to eat healthier foods, she skips meals frequently, she is frequently drinking liquids with calories, and she struggles with emotional eating.  Depression Screen Tammy Chapman's Food and Mood (modified PHQ-9) score was 9.  Subjective:   1. Other fatigue [name] admits to daytime somnolence and admits to waking up still tired. Patient has a history of symptoms of daytime fatigue, morning fatigue, and morning headache. Tammy Chapman generally gets 9 hours of sleep per night, and states that she has generally restful sleep. Snoring is present. Apneic episodes are present. Epworth Sleepiness Score is 1.   2. SOBOE (shortness of breath on exertion) Tammy Chapman notes increasing shortness of breath with exercising and seems to be worsening over time with weight gain. She notes getting out of breath sooner with activity than she used to. This has not gotten worse recently. Tammy Chapman denies shortness of breath at rest or orthopnea.  3. PCOS (polycystic ovarian  syndrome) Condition linked to weight and possible insulin resistance.  4. Pure hypercholesterolemia LDL is not at goal. Elevated LDL may be secondary to nutrition, genetics and spillover effect from excess adiposity. Recommended LDL goal is <70 to reduce the risk of fatty streaks and the progression to obstructive ASCVD in the future.   Lab Results  Component Value Date   CHOL 201 (H) 02/27/2021   HDL 53.30 02/27/2021   LDLCALC 129 (H) 02/27/2021   TRIG 89.0 02/27/2021   CHOLHDL 4 02/27/2021   Assessment/Plan:   1. Other fatigue [name] does feel that her weight is causing her energy to be lower than it should be. Fatigue may be related to obesity, depression or many other causes. Labs will be ordered, and in the meanwhile, [name] will focus on self care including making healthy food choices, increasing physical activity and focusing on stress reduction.  - EKG 12-Lead - Vitamin B12 - Comprehensive metabolic panel - Hemoglobin A1c - Insulin, random - VITAMIN D 25 Hydroxy (Vit-D Deficiency, Fractures)  2. SOBOE (shortness of breath on exertion) [name] does feel that she gets out of breath more easily that she used to when she exercises. Tammy Chapman's shortness of breath appears to be obesity related and exercise induced. She has agreed to work on weight loss and gradually increase exercise to treat her exercise induced shortness of breath. Will continue to monitor closely.  3. PCOS (polycystic ovarian syndrome) We will check glycemic parameters and insulin levels.  Losing 10% of body weight may improve condition.  She may also benefit from pharmacoprophylaxis with metformin for inpatient therapy.  -  Hemoglobin A1c - Insulin, random  4. Pure hypercholesterolemia Continue weight loss therapy, losing 10% or more of body weight may improve condition. Also advised to reduce saturated fats in diet to less than 10% of daily calories.  Check fasting lipid panel today.  TSH was normal.  -  Comprehensive metabolic panel - Lipid panel  5. Depression screening Tammy Chapman had a positive depression screening. Depression is commonly associated with obesity and often results in emotional eating behaviors. We will monitor this closely and work on CBT to help improve the non-hunger eating patterns. Referral to Psychology may be required if no improvement is seen as she continues in our clinic.  6. Class 2 severe obesity with serious comorbidity and body mass index (BMI) of 36.0 to 36.9 in adult, unspecified obesity type (HCC) - Comprehensive metabolic panel - VITAMIN D 25 Hydroxy (Vit-D Deficiency, Fractures)  Tammy Chapman is currently in the action stage of change and her goal is to continue with weight loss efforts. I recommend [name] begin the structured treatment plan as follows:  She has agreed to the Category 4 Plan.  Exercise goals: No exercise has been prescribed at this time.   Behavioral modification strategies: increasing lean protein intake, decreasing simple carbohydrates, increasing vegetables, increasing water intake, increasing high fiber foods, no skipping meals, meal planning and cooking strategies, and planning for success.  She was informed of the importance of frequent follow-up visits to maximize her success with intensive lifestyle modifications for her multiple health conditions. She was informed we would discuss her lab results at her next visit unless there is a critical issue that needs to be addressed sooner. Tammy Chapman agreed to keep her next visit at the agreed upon time to discuss these results.  Objective:   Blood pressure 103/72, pulse 72, temperature 98.5 F (36.9 C), height 5\' 9"  (1.753 m), weight 248 lb (112.5 kg), SpO2 97%. Body mass index is 36.62 kg/m.  EKG: Normal sinus rhythm, rate 76 BPM.  Indirect Calorimeter completed today shows a VO2 of 336 and a REE of 2318.  Her calculated basal metabolic rate is 4098 thus her basal metabolic rate is better than  expected.  General: Cooperative, alert, well developed, in no acute distress. HEENT: Conjunctivae and lids unremarkable. Cardiovascular: Regular rhythm.  Lungs: Normal work of breathing. Neurologic: No focal deficits.   Lab Results  Component Value Date   CREATININE 0.81 03/04/2023   BUN 9 03/04/2023   NA 138 03/04/2023   K 3.9 03/04/2023   CL 98 03/04/2023   CO2 24 03/04/2023   Lab Results  Component Value Date   ALT 31 03/04/2023   AST 17 03/04/2023   ALKPHOS 106 03/04/2023   BILITOT 0.9 03/04/2023   Lab Results  Component Value Date   HGBA1C 5.5 03/04/2023   HGBA1C 5.5 12/17/2022   Lab Results  Component Value Date   INSULIN 11.0 03/04/2023   Lab Results  Component Value Date   TSH 0.88 12/17/2022   Lab Results  Component Value Date   CHOL 262 (H) 03/04/2023   HDL 62 03/04/2023   LDLCALC 181 (H) 03/04/2023   TRIG 110 03/04/2023   CHOLHDL 4.2 03/04/2023   Lab Results  Component Value Date   WBC 6.0 12/17/2022   HGB 14.7 12/17/2022   HCT 44.0 12/17/2022   MCV 85.5 12/17/2022   PLT 243.0 12/17/2022   No results found for: "IRON", "TIBC", "FERRITIN"  Attestation Statements:   Reviewed by clinician on day of visit: allergies, medications, problem  list, medical history, surgical history, family history, social history, and previous encounter notes.  Time spent on visit including pre-visit chart review and post-visit charting and care was 40 minutes.   Trude Mcburney, am acting as transcriptionist for Worthy Rancher, MD.  I have reviewed the above documentation for accuracy and completeness, and I agree with the above. -Worthy Rancher, MD

## 2023-03-04 NOTE — Assessment & Plan Note (Signed)
LDL is not at goal. Elevated LDL may be secondary to nutrition, genetics and spillover effect from excess adiposity. Recommended LDL goal is <70 to reduce the risk of fatty streaks and the progression to obstructive ASCVD in the future.   Lab Results  Component Value Date   CHOL 201 (H) 02/27/2021   HDL 53.30 02/27/2021   LDLCALC 129 (H) 02/27/2021   TRIG 89.0 02/27/2021   CHOLHDL 4 02/27/2021    Continue weight loss therapy, losing 10% or more of body weight may improve condition. Also advised to reduce saturated fats in diet to less than 10% of daily calories.  Check fasting lipid panel today.  TSH was normal.

## 2023-03-05 LAB — COMPREHENSIVE METABOLIC PANEL
ALT: 31 IU/L (ref 0–32)
AST: 17 IU/L (ref 0–40)
Albumin: 4.6 g/dL (ref 3.9–4.9)
Alkaline Phosphatase: 106 IU/L (ref 44–121)
BUN/Creatinine Ratio: 11 (ref 9–23)
BUN: 9 mg/dL (ref 6–20)
Bilirubin Total: 0.9 mg/dL (ref 0.0–1.2)
CO2: 24 mmol/L (ref 20–29)
Calcium: 9 mg/dL (ref 8.7–10.2)
Chloride: 98 mmol/L (ref 96–106)
Creatinine, Ser: 0.81 mg/dL (ref 0.57–1.00)
Globulin, Total: 2.5 g/dL (ref 1.5–4.5)
Glucose: 83 mg/dL (ref 70–99)
Potassium: 3.9 mmol/L (ref 3.5–5.2)
Sodium: 138 mmol/L (ref 134–144)
Total Protein: 7.1 g/dL (ref 6.0–8.5)
eGFR: 99 mL/min/{1.73_m2} (ref >59)

## 2023-03-05 LAB — LIPID PANEL
Chol/HDL Ratio: 4.2 ratio (ref 0.0–4.4)
Cholesterol, Total: 262 mg/dL — ABNORMAL HIGH (ref 100–199)
HDL: 62 mg/dL (ref >39)
LDL Chol Calc (NIH): 181 mg/dL — ABNORMAL HIGH (ref 0–99)
Triglycerides: 110 mg/dL (ref 0–149)
VLDL Cholesterol Cal: 19 mg/dL (ref 5–40)

## 2023-03-05 LAB — HEMOGLOBIN A1C
Est. average glucose Bld gHb Est-mCnc: 111 mg/dL
Hgb A1c MFr Bld: 5.5 % (ref 4.8–5.6)

## 2023-03-05 LAB — VITAMIN B12: Vitamin B-12: 609 pg/mL (ref 232–1245)

## 2023-03-05 LAB — VITAMIN D 25 HYDROXY (VIT D DEFICIENCY, FRACTURES): Vit D, 25-Hydroxy: 28.1 ng/mL — ABNORMAL LOW (ref 30.0–100.0)

## 2023-03-05 LAB — INSULIN, RANDOM: INSULIN: 11 u[IU]/mL (ref 2.6–24.9)

## 2023-03-06 ENCOUNTER — Ambulatory Visit
Admission: EM | Admit: 2023-03-06 | Discharge: 2023-03-06 | Disposition: A | Discharge status: Home / Self Care | Payer: Commercial Managed Care - PPO | Source: Home / Self Care

## 2023-03-06 DIAGNOSIS — J029 Acute pharyngitis, unspecified: Secondary | ICD-10-CM | POA: Diagnosis not present

## 2023-03-06 DIAGNOSIS — J069 Acute upper respiratory infection, unspecified: Secondary | ICD-10-CM | POA: Insufficient documentation

## 2023-03-06 DIAGNOSIS — Z1152 Encounter for screening for COVID-19: Secondary | ICD-10-CM | POA: Insufficient documentation

## 2023-03-06 LAB — POCT RAPID STREP A (OFFICE): Rapid Strep A Screen: NEGATIVE

## 2023-03-06 MED ORDER — PROMETHAZINE-DM 6.25-15 MG/5ML PO SYRP: 5.0000 mL | ORAL_SOLUTION | Freq: Four times a day (QID) | ORAL | 0 refills | Status: DC | PRN

## 2023-03-06 MED ORDER — LIDOCAINE VISCOUS HCL 2 % MT SOLN: OROMUCOSAL | 0 refills | Status: DC

## 2023-03-06 MED ORDER — FLUTICASONE PROPIONATE 50 MCG/ACT NA SUSP: 2.0000 | Freq: Every day | NASAL | 0 refills | Status: DC

## 2023-03-06 NOTE — Discharge Instructions (Addendum)
The rapid strep test was negative. A throat culture and COVID test are pending. You will be contacted if the pending test results are abnormal. You will also have acces to the results using your MyChart account. Take medication as prescribed. Continue your current allergy medication.  Increase fluids and allow for plenty of rest. May take OTC Tylenol or Ibuprofen as needed for pain, fever or general discomfort. Warm saltwater gargles 3 to 4 times daily symptoms persist. May use normal saline nasal spray to help with nasal congestion and runny nose. Use a humidifier in your bedroom at nighttime and sleep elevated to help with your cough. As discussed, if symptoms have not improved over the next 5 to 7 days or if they suddenly worsen, please follow-up in this clinic or with your PCP for further evaluation.  Follow-up as needed.

## 2023-03-06 NOTE — ED Triage Notes (Signed)
Pt presents with sore throat, cough, congestion, headache, that started Sunday. Taking mucinx with little relief.

## 2023-03-06 NOTE — ED Provider Notes (Addendum)
[facility]    CSN: [id number] Arrival date & time: 03/06/23  0806      History   Chief Complaint Chief Complaint  Patient presents with   Sore Throat   Cough    HPI Tammy Chapman is a 32 y.o. female.   The history is provided by the patient.   The patient presents for complaints of sore throat and cough. Symptoms started 5 days ago. She also complains of bodyaches, headaches, and nasal congestion. She states she suspects she may have had a fever as she woke up sweating through the night. She has a history of asthma and seasonal allergies. She denies ear drainage, wheezing, SOB, difficulty breathing, chest pain, abdominal pain, nausea, vomiting or diarrhea. She reports her boss was recently diagnosed with COVID. She has been taking Mucinex for her symptoms.   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/2019   Chronic asthma 01/19/2019   Ehlers-Danlos disease    Gallbladder problem    IBS (irritable bowel syndrome)    Joint pain    Lactose intolerance    Loose body of right knee 02/2013   PCOS (polycystic ovarian syndrome)    Pregnancy induced hypertension    Recurrent otitis media of both ears 01/19/2019   SOBOE (shortness of breath on exertion)    Sprain and strain of medial collateral ligament of knee 02/2013   right    Patient Active Problem List   Diagnosis Date Noted   Other fatigue 03/04/2023   SOBOE (shortness of breath on exertion) 03/04/2023   Pure hypercholesterolemia 03/04/2023   Class 2 obesity due to excess calories without serious comorbidity with body mass index (BMI) of 36.0 to 36.9 in adult 02/16/2023   Ehlers-Danlos disease 02/16/2023   Class 2 severe obesity with serious comorbidity and body mass index (BMI) of 36.0 to 36.9 in adult [place]) 12/17/2022   Cervical cancer screening 12/17/2022   PCOS (polycystic ovarian syndrome) 09/24/2021    Gastroesophageal reflux disease without esophagitis 02/27/2021   Depression screening 02/27/2021   Allergic rhinitis 09/15/2019   Moderate persistent asthma 01/19/2019   Bronchitis, mucopurulent recurrent (HCC) 01/19/2019    Past Surgical History:  Procedure Laterality Date   CESAREAN SECTION N/A 01/08/2017   Procedure: CESAREAN SECTION;  Surgeon: Tilda Burrow, MD;  Location: [facility] BIR[facility]ING SU[facility];  Service: Obstetrics;  Laterality: N/A;   CESAREAN SECTION N/A 07/01/2019   Procedure: REPEAT CESAREAN SECTION;  Surgeon: Tilda Burrow, MD;  Location: [facility] LD ORS;  Service: Obstetrics;  Laterality: N/A;   CHOLECYSTECTOMY  01/20/2008   EAR TUBE REMOVAL  2002   ESOPHAGOGASTRODUODENOSCOPY  05/15/2011   Procedure: ESOPHAGOGASTRODUODENOSCOPY (EGD);  Surgeon: Malissa Hippo, MD;  Location: [facility] ENDO [facility];  Service: Endoscopy;  Laterality: N/A;  3:15    KNEE ARTHROSCOPY Right 03/15/2013   Procedure: RIGHT  KNEE ARTHROSCOPY, FEMORAL PATELLA REEFING, LATERAL RELEASE, PARTIAL LATERAL MENISCECTOMY ;  Surgeon: Nilda Simmer, MD;  Location: [facility];  Service: Orthopedics;  Laterality: Right;   TONSILLECTOMY AND ADENOIDECTOMY  1999   TYMPANOSTOMY TUBE PLACEMENT  1999, 2011    OB History     Gravida  2   Para  2   Term  2   Preterm      AB      Living  2      SAB  IAB      Ectopic      Multiple      Live Births  2            Home Medications    Prior to Admission medications   Medication Sig Start Date End Date Taking? Authorizing Provider  albuterol (PROVENTIL) (2.5 MG/3ML) 0.083% nebulizer solution Inhale 1 vial (2.5 mg total) by nebulization every 6 (six) hours as needed for wheezing or shortness of breath. 12/05/21  Yes Cobb, Ruby Cola, NP  aspirin EC 81 MG tablet Take 81 mg by mouth daily. Swallow whole.   Yes [provider]  budesonide-formoterol (SYMBICORT) 160-4.5 MCG/ACT inhaler INHALE 2 PUFFS BY MOUTH 2 TIMES DAILY 06/05/22 06/05/23  Yes Leslye Peer, MD  levocetirizine (XYZAL) 5 MG tablet TAKE 1 TABLET BY MOUTH EVERY EVENING 03/27/22 03/27/23 Yes Leslye Peer, MD  montelukast (SINGULAIR) 10 MG tablet TAKE 1 TABLET BY MOUTH AT BEDTIME 03/27/22 03/27/23 Yes Byrum, Les Pou, MD  Tezepelumab-ekko (TEZSPIRE) 210 MG/1. SOAJ Inject 210 mg into the skin every 28 (twenty-eight) days. 11/28/22  Yes Quentin Angst, MD  Tiotropium Bromide Monohydrate (SPIRIVA RESPIMAT) 1.25 MCG/ACT AERS Inhale 2 puffs into the lungs daily. 12/05/21  Yes Cobb, Ruby Cola, NP  Albuterol-Budesonide (AIRSUPRA) 90-80 MCG/ACT AERO Inhale 2 puffs into the lungs 4 (four) times daily as needed. 12/17/22   Etta Grandchild, MD  Lactic Ac-Citric Ac-Pot Bitart (PHEXXI) 1.8-1-0.4 % GEL Place 1 Applicatorful vaginally as needed. Immediately before or up to 1 hour before sex Patient not taking: Reported on 03/04/2023 12/17/22   Etta Grandchild, MD    Family History Family History  Problem Relation Age of Onset   Diabetes Mother    Depression Mother    Anxiety disorder Mother    Obesity Mother    Obesity Father    Anxiety disorder Father    Depression Father    Heart disease Father    Hypertension Father    Celiac disease Father    Kidney disease Father    Alcoholism Father    Ulcerative colitis Maternal Grandmother    Heart disease Maternal Grandmother    Diabetes Maternal Grandfather    COPD Maternal Grandfather    Congestive Heart Failure Maternal Grandfather    Kidney disease Maternal Grandfather    Heart disease Paternal Grandmother    Renal cancer Paternal Grandfather    Heart disease Paternal Grandfather    Other Daughter        milk and soy allergy   Kidney disease Maternal Uncle    Colon cancer Neg Hx    Esophageal cancer Neg Hx    Stomach cancer Neg Hx    Rectal cancer Neg Hx     Social History Social History   Tobacco Use   Smoking status: Never   Smokeless tobacco: Never  Vaping Use   Vaping status: Never Used  Substance  Use Topics   Alcohol use: No   Drug use: No     Allergies   Benadryl [diphenhydramine hcl], Oxycodone-acetaminophen, Penicillins, Adhesive [tape], Lactose intolerance (gi), Latex, Omnipaque [iohexol], Sulfa antibiotics, and Ultram [tramadol hcl]   Review of Systems Review of Systems Per HPI  Physical Exam Triage Vital Signs ED Triage Vitals [03/06/23 0819]  Encounter Vitals Group     BP 124/88     Systolic BP Percentile      Diastolic BP Percentile      Pulse Rate 80     Resp 18  Temp 98.5 F (36.9 C)     Temp Source Oral     SpO2 98 %     Weight      Height      Head Circumference      Peak Flow      Pain Score 4     Pain Loc      Pain Education      Exclude from Growth Chart    No data found.  Updated Vital Signs BP 124/88 (BP Location: Right Arm)   Pulse 80   Temp 98.5 F (36.9 C) (Oral)   Resp 18   LMP 01/28/2023 (Exact Date)   SpO2 98%   Visual Acuity Right Eye Distance:   Left Eye Distance:   Bilateral Distance:    Right Eye Near:   Left Eye Near:    Bilateral Near:     Physical Exam Vitals and nursing note reviewed.  Constitutional:      General: She is not in acute distress.    Appearance: She is well-developed.  HENT:     Head: Normocephalic.     Right Ear: Tympanic membrane and ear canal normal.     Left Ear: Tympanic membrane and ear canal normal.     Ears:     Comments: Myringtomy tubes in place bilaterally    Nose: Congestion present. No rhinorrhea.     Mouth/Throat:     Lips: Pink.     Mouth: Mucous membranes are moist.     Pharynx: Uvula midline. Pharyngeal swelling, posterior oropharyngeal erythema and postnasal drip present. No oropharyngeal exudate or uvula swelling.     Tonsils: No tonsillar exudate. 1+ on the right. 1+ on the left.  Eyes:     Extraocular Movements:     Right eye: Normal extraocular motion.     Left eye: Normal extraocular motion.     Conjunctiva/sclera: Conjunctivae normal.     Pupils: Pupils are  equal, round, and reactive to light.  Cardiovascular:     Rate and Rhythm: Normal rate and regular rhythm.     Heart sounds: Normal heart sounds.  Pulmonary:     Effort: Pulmonary effort is normal. No respiratory distress.     Breath sounds: Normal breath sounds. No stridor. No wheezing, rhonchi or rales.  Abdominal:     General: Bowel sounds are normal.     Palpations: Abdomen is soft.     Tenderness: There is no abdominal tenderness.  Musculoskeletal:     Cervical back: Normal range of motion.  Lymphadenopathy:     Cervical: No cervical adenopathy.  Skin:    General: Skin is warm and dry.  Neurological:     General: No focal deficit present.     Mental Status: She is alert and oriented to person, place, and time.  Psychiatric:        Mood and Affect: Mood normal.        Behavior: Behavior normal.      UC Treatments / Results  Labs (all labs ordered are listed, but only abnormal results are displayed) Labs Reviewed  SARS CORONAVIRUS 2 (TAT 6-24 HRS)  CULTURE, GROUP A STREP [facility])  POCT RAPID STREP A (OFFICE)    EKG   Radiology No results found.  Procedures Procedures (including critical care time)  Medications Ordered in UC Medications - No data to display  Initial Impression / Assessment and Plan / UC Course  I have reviewed the triage vital signs and the nursing notes.  Pertinent labs & imaging results that were available during my care of the patient were reviewed by me and considered in my medical decision making (see chart for details).  The patient is well-appearing, she is in NAD, vital signs are stable.  Rapid strep test is negative. Throat culture and COVID test are pending. The patient is a candidate to receive Paxlovid if her COVID test is positive. Suspect a viral upper respiratory infection with cough. Promethazine DM prescribed for cough, fluticasone nasal spray for nasal congestion and runny nose, and viscous lidocaine 2% for throat pain.  Supportive care recommendations were provided to the patient to include use of OTC analgesics for pain or discomfort, increasing fluids/rest, warm saltwater gargles, and using a humidifier to help with cough. Discussed indications with the patient regarding when follow-up would be indicated. The patient is in agreement with this plan of care and verbalizes understanding. All questions were answered. The patient is stable for discharge.   Final Clinical Impressions(s) / UC Diagnoses   Final diagnoses:  Viral upper respiratory tract infection with cough  Sore throat  Encounter for screening for COVID-19   Discharge Instructions   None    ED Prescriptions   None    PDMP not reviewed this encounter.   Abran Cantor, NP 03/06/23 0850    Abran Cantor, NP 03/06/23 1410

## 2023-03-07 LAB — CULTURE, GROUP A STREP (THRC)

## 2023-03-07 LAB — SARS CORONAVIRUS 2 (TAT 6-24 HRS): SARS Coronavirus 2: NEGATIVE

## 2023-03-09 LAB — CULTURE, GROUP A STREP (THRC)

## 2023-03-18 ENCOUNTER — Other Ambulatory Visit: Payer: Self-pay | Admitting: Emergency Medicine

## 2023-03-18 ENCOUNTER — Other Ambulatory Visit: Payer: Self-pay | Admitting: Nurse Practitioner

## 2023-03-18 ENCOUNTER — Other Ambulatory Visit (HOSPITAL_COMMUNITY): Payer: Self-pay

## 2023-03-18 ENCOUNTER — Ambulatory Visit (INDEPENDENT_AMBULATORY_CARE_PROVIDER_SITE_OTHER): Payer: Commercial Managed Care - PPO | Admitting: Internal Medicine

## 2023-03-18 ENCOUNTER — Other Ambulatory Visit: Payer: Self-pay

## 2023-03-18 MED ORDER — SPIRIVA RESPIMAT 1.25 MCG/ACT IN AERS
2.0000 | INHALATION_SPRAY | Freq: Every day | RESPIRATORY_TRACT | 6 refills | Status: DC
  Filled 2023-03-18: qty 4, 30d supply, fill #0
  Filled 2023-04-17: qty 4, 30d supply, fill #1
  Filled 2023-06-18: qty 4, 30d supply, fill #2
  Filled 2023-09-28: qty 4, 30d supply, fill #3
  Filled 2023-10-23 – 2023-10-28 (×3): qty 4, 30d supply, fill #4

## 2023-03-18 MED ORDER — BUDESONIDE-FORMOTEROL FUMARATE 160-4.5 MCG/ACT IN AERO
2.0000 | INHALATION_SPRAY | Freq: Two times a day (BID) | RESPIRATORY_TRACT | 11 refills | Status: DC
  Filled 2023-03-18: qty 10.2, 30d supply, fill #0
  Filled 2023-04-17: qty 10.2, 30d supply, fill #1
  Filled 2023-06-18: qty 10.2, 30d supply, fill #2
  Filled 2023-09-28: qty 10.2, 30d supply, fill #3
  Filled 2023-10-23 – 2023-10-28 (×3): qty 10.2, 30d supply, fill #4

## 2023-03-19 ENCOUNTER — Ambulatory Visit (INDEPENDENT_AMBULATORY_CARE_PROVIDER_SITE_OTHER): Payer: Commercial Managed Care - PPO | Admitting: Internal Medicine

## 2023-03-19 ENCOUNTER — Encounter (INDEPENDENT_AMBULATORY_CARE_PROVIDER_SITE_OTHER): Payer: Self-pay | Admitting: Internal Medicine

## 2023-03-19 ENCOUNTER — Other Ambulatory Visit (HOSPITAL_COMMUNITY): Payer: Self-pay

## 2023-03-19 VITALS — BP 108/75 | HR 83 | Temp 98.3°F | Ht 69.0 in | Wt 250.0 lb

## 2023-03-19 DIAGNOSIS — Z6836 Body mass index (BMI) 36.0-36.9, adult: Secondary | ICD-10-CM

## 2023-03-19 DIAGNOSIS — E7800 Pure hypercholesterolemia, unspecified: Secondary | ICD-10-CM

## 2023-03-19 DIAGNOSIS — E559 Vitamin D deficiency, unspecified: Secondary | ICD-10-CM

## 2023-03-19 NOTE — Progress Notes (Signed)
Office: [phone]  /  Fax: [phone]  WEIGHT SUMMARY AND BIOMETRICS  Vitals Temp: 98.3 F (36.8 C) BP: 108/75 Pulse Rate: 83 SpO2: 98 %   Anthropometric Measurements Height: 5\' 9"  (1.753 m) Weight: 250 lb (113.4 kg) BMI (Calculated): 36.9 Weight at Last Visit: 248 lb Weight Lost Since Last Visit: 0 lb Weight Gained Since Last Visit: 2 lb Starting Weight: 248 lb Total Weight Loss (lbs): 0 lb (0 kg) Peak Weight: 260 lb   Body Composition  Body Fat %: 45.6 % Fat Mass (lbs): 114.2 lbs Muscle Mass (lbs): 129.6 lbs Total Body Water (lbs): 93.8 lbs Visceral Fat Rating : 10    RMR: [id number]  Today's Visit #: 2  Starting Date: 03/04/23   HPI  Chief Complaint: OBESITY  Tammy Chapman is here to discuss her progress with her obesity treatment plan. She is on the the Category 4 Plan and states she is following her eating plan approximately 100 % of the time. She states she is exercising 15 minutes 4-5 times per week.  Interval History:  Since last office visit she has gained 2 lbs..  Has been working hard to implement plan but has noticed difficulties with it due to her children having severe food allergies which limit food options.  She does not like fruits and vegetables and also lean sources of meat.  We discussed today the possibility of doing a low-carb plan.    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 She reports  having to modify plan considering food allergies in the household but she has been staying under targeted calories She has been working on reading food labels, increasing protein intake at every meal, drinking more water, and journaling and tracking calories  Orixegenic Control: Denies problems with appetite and hunger signals.  Denies problems with satiety and satiation.  Denies problems with eating patterns and portion control.  Denies abnormal cravings. Denies feeling deprived or restricted.    Barriers identified: food allergies in the household.  Pharmacotherapy for weight loss: She is currently taking no anti-obesity medication.    ASSESSMENT AND PLAN  TREATMENT PLAN FOR OBESITY:  Recommended Dietary Goals  Timberly is currently in the action stage of change. As such, her goal is to continue weight management plan. She has agreed to: switch to modified ketogenic plan .  Patient has expressed interest in rapid weight loss and actually does not mind being on a low-carb plan.  We discussed the benefits and risk associated with plan and the importance of maintaining adequate hydration and adherence to provided plan and guidelines.  Behavioral Intervention  We discussed the following Behavioral Modification Strategies today: increasing water intake and planning for success.  Additional resources provided today:  Ketogenic plan  Recommended Physical Activity Goals  Tammy Chapman has been advised to work up to 150 minutes of moderate intensity aerobic activity a week and strengthening exercises 2-3 times per week for cardiovascular health, weight loss maintenance and preservation of muscle mass.   She has agreed to :  Think about ways to increase daily physical activity and overcoming barriers to exercise and Increase physical activity in their day and reduce sedentary time (increase NEAT).  Pharmacotherapy We discussed various medication options to help Tammy Chapman with her weight loss efforts and we both agreed to :  Provided with a list of FDA approved medications.  ASSOCIATED CONDITIONS ADDRESSED TODAY  Class 2 severe obesity with serious comorbidity and body mass index (  BMI) of 36.0 to 36.9 in adult, unspecified obesity type [facility]) Assessment & Plan: See obesity treatment plan  Metabolically her fasting blood sugar and hemoglobin A1c are optimal.  She has normal renal parameters and liver enzymes are normal.  She has a mild elevation in fasting insulin and this should improve as she  begins her weight loss journey.   Vitamin D deficiency Assessment & Plan: Most recent vitamin D levels  Lab Results  Component Value Date   VD25OH 28.1 (L) 03/04/2023     Deficiency state associated with adiposity and may result in leptin resistance, weight gain and fatigue.   Plan: After discussion of benefits, alternative treatment options and side effects patient will be started on vitamin D2 50,000 units 1 tablet weekly for 3-4 months. for a treatment goal level of 50-60 mg/dl. Check levels at that time for response monitoring.    Pure hypercholesterolemia Assessment & Plan: LDL is not at goal.  Previous LDL cholesterol was 120.  She reports possibly having family history of heart disease under the age of 78.  Her grandfather passed away from "heart failure" and also have multiple comorbid conditions.  Elevated LDL may be secondary to nutrition, genetics and spillover effect from excess adiposity. Recommended LDL goal is <70 to reduce the risk of fatty streaks and the progression to obstructive ASCVD in the future.   Her 10 year risk is: The ASCVD Risk score (Arnett DK, et al., 2019) failed to calculate for the following reasons:   The 2019 ASCVD risk score is only valid for ages 77 to 1  Lab Results  Component Value Date   CHOL 262 (H) 03/04/2023   HDL 62 03/04/2023   LDLCALC 181 (H) 03/04/2023   TRIG 110 03/04/2023   CHOLHDL 4.2 03/04/2023    We discussed possible causes.  I feel she may have a familial component.  We discussed reducing saturated fats to less than 10% of her calories which will be hard to do as she has opted to try a ketogenic plan.  If this is due to an over spillover effect from excess adiposity then we should see an improvement with weight loss.  But considering her age and time horizon she may actually benefit from starting statin therapy to reduce the risk of future events considering family history.  We will check her fasting lipid profile at 3 months  and further assess.     Other orders -     Vitamin D (Ergocalciferol); Take 1 capsule (50,000 Units total) by mouth every 7 (seven) days.  Dispense: 16 capsule; Refill: 0    PHYSICAL EXAM:  Blood pressure 108/75, pulse 83, temperature 98.3 F (36.8 C), height 5\' 9"  (1.753 m), weight 250 lb (113.4 kg), last menstrual period 01/23/2023, SpO2 98%. Body mass index is 36.92 kg/m.  General: She is overweight, cooperative, alert, well developed, and in no acute distress. PSYCH: Has normal mood, affect and thought process.   HEENT: EOMI, sclerae are anicteric. Lungs: Normal breathing effort, no conversational dyspnea. Extremities: No edema.  Neurologic: No gross sensory or motor deficits. No tremors or fasciculations noted.    DIAGNOSTIC DATA REVIEWED:  BMET    Component Value Date/Time   NA 138 03/04/2023 1441   K 3.9 03/04/2023 1441   CL 98 03/04/2023 1441   CO2 24 03/04/2023 1441   GLUCOSE 83 03/04/2023 1441   GLUCOSE 93 09/25/2020 1514   BUN 9 03/04/2023 1441   CREATININE 0.81 03/04/2023 1441   CREATININE  0.54 01/19/2019 1012   CALCIUM 9.0 03/04/2023 1441   GFRNONAA >60 07/02/2019 0602   GFRNONAA 130 01/19/2019 1012   GFRAA >60 07/02/2019 0602   GFRAA 151 01/19/2019 1012   Lab Results  Component Value Date   HGBA1C 5.5 03/04/2023   HGBA1C 5.5 12/17/2022   Lab Results  Component Value Date   INSULIN 11.0 03/04/2023   Lab Results  Component Value Date   TSH 0.88 12/17/2022   CBC    Component Value Date/Time   WBC 6.0 12/17/2022 1513   RBC 5.15 (H) 12/17/2022 1513   HGB 14.7 12/17/2022 1513   HGB 12.0 04/13/2019 0852   HCT 44.0 12/17/2022 1513   HCT 36.2 04/13/2019 0852   PLT 243.0 12/17/2022 1513   PLT 171 04/13/2019 0852   MCV 85.5 12/17/2022 1513   MCV 84 04/13/2019 0852   MCH 24.2 (L) 07/02/2019 0602   MCHC 33.4 12/17/2022 1513   RDW 13.3 12/17/2022 1513   RDW 12.0 04/13/2019 0852   Iron Studies No results found for: "IRON", "TIBC", "FERRITIN",  "IRONPCTSAT" Lipid Panel     Component Value Date/Time   CHOL 262 (H) 03/04/2023 1441   TRIG 110 03/04/2023 1441   HDL 62 03/04/2023 1441   CHOLHDL 4.2 03/04/2023 1441   CHOLHDL 4 02/27/2021 1049   VLDL 17.8 02/27/2021 1049   LDLCALC 181 (H) 03/04/2023 1441   Hepatic Function Panel     Component Value Date/Time   PROT 7.1 03/04/2023 1441   ALBUMIN 4.6 03/04/2023 1441   AST 17 03/04/2023 1441   ALT 31 03/04/2023 1441   ALKPHOS 106 03/04/2023 1441   BILITOT 0.9 03/04/2023 1441   BILIDIR 0.1 12/17/2022 1513      Component Value Date/Time   TSH 0.88 12/17/2022 1513   Nutritional Lab Results  Component Value Date   VD25OH 28.1 (L) 03/04/2023     Return in about 3 weeks (around 04/09/2023) for For Weight Mangement with Dr. Rikki Spearing.Marland Kitchen She was informed of the importance of frequent follow up visits to maximize her success with intensive lifestyle modifications for her multiple health conditions.   ATTESTASTION STATEMENTS:  Reviewed by clinician on day of visit: allergies, medications, problem list, medical history, surgical history, family history, social history, and previous encounter notes.     Worthy Rancher, MD

## 2023-03-20 ENCOUNTER — Other Ambulatory Visit (HOSPITAL_COMMUNITY): Payer: Self-pay

## 2023-03-20 ENCOUNTER — Other Ambulatory Visit: Payer: Self-pay

## 2023-03-20 MED ORDER — VITAMIN D (ERGOCALCIFEROL) 1.25 MG (50000 UNIT) PO CAPS
50000.0000 [IU] | ORAL_CAPSULE | ORAL | 0 refills | Status: DC
  Filled 2023-03-20: qty 12, 84d supply, fill #0

## 2023-03-20 NOTE — Assessment & Plan Note (Signed)
LDL is not at goal.  Previous LDL cholesterol was 120.  She reports possibly having family history of heart disease under the age of 56.  Her grandfather passed away from "heart failure" and also have multiple comorbid conditions.  Elevated LDL may be secondary to nutrition, genetics and spillover effect from excess adiposity. Recommended LDL goal is <70 to reduce the risk of fatty streaks and the progression to obstructive ASCVD in the future.   Her 10 year risk is: The ASCVD Risk score (Arnett DK, et al., 2019) failed to calculate for the following reasons:   The 2019 ASCVD risk score is only valid for ages 66 to 67  Lab Results  Component Value Date   CHOL 262 (H) 03/04/2023   HDL 62 03/04/2023   LDLCALC 181 (H) 03/04/2023   TRIG 110 03/04/2023   CHOLHDL 4.2 03/04/2023    We discussed possible causes.  I feel she may have a familial component.  We discussed reducing saturated fats to less than 10% of her calories which will be hard to do as she has opted to try a ketogenic plan.  If this is due to an over spillover effect from excess adiposity then we should see an improvement with weight loss.  But considering her age and time horizon she may actually benefit from starting statin therapy to reduce the risk of future events considering family history.  We will check her fasting lipid profile at 3 months and further assess.

## 2023-03-20 NOTE — Assessment & Plan Note (Addendum)
See obesity treatment plan  Metabolically her fasting blood sugar and hemoglobin A1c are optimal.  She has normal renal parameters and liver enzymes are normal.  She has a mild elevation in fasting insulin and this should improve as she begins her weight loss journey.

## 2023-03-20 NOTE — Assessment & Plan Note (Signed)
Most recent vitamin D levels  Lab Results  Component Value Date   VD25OH 28.1 (L) 03/04/2023     Deficiency state associated with adiposity and may result in leptin resistance, weight gain and fatigue.   Plan: After discussion of benefits, alternative treatment options and side effects patient will be started on vitamin D2 50,000 units 1 tablet weekly for 3-4 months. for a treatment goal level of 50-60 mg/dl. Check levels at that time for response monitoring.

## 2023-03-23 ENCOUNTER — Other Ambulatory Visit (HOSPITAL_COMMUNITY): Payer: Self-pay

## 2023-03-23 ENCOUNTER — Other Ambulatory Visit: Payer: Self-pay | Admitting: Emergency Medicine

## 2023-03-23 DIAGNOSIS — J4551 Severe persistent asthma with (acute) exacerbation: Secondary | ICD-10-CM

## 2023-03-24 ENCOUNTER — Other Ambulatory Visit (HOSPITAL_COMMUNITY): Payer: Self-pay

## 2023-03-30 ENCOUNTER — Telehealth: Payer: Self-pay

## 2023-03-30 DIAGNOSIS — J4551 Severe persistent asthma with (acute) exacerbation: Secondary | ICD-10-CM

## 2023-03-30 NOTE — Telephone Encounter (Signed)
Received fax from [facility] stating that PA has been initiated for Tezspire. Pt has not been seen since 04/08/22, however she already has an appointment scheduled for 04/27/23 and her current PA does not expire until 04/09/23. Pt is due for a refill right now, and thus should not be due again until AFTER her next appointment. Will push out for f/u.  CMM KEY: [id number]

## 2023-04-02 ENCOUNTER — Other Ambulatory Visit (HOSPITAL_COMMUNITY): Payer: Self-pay

## 2023-04-15 ENCOUNTER — Ambulatory Visit (INDEPENDENT_AMBULATORY_CARE_PROVIDER_SITE_OTHER): Payer: Commercial Managed Care - PPO | Admitting: Internal Medicine

## 2023-04-15 ENCOUNTER — Encounter (INDEPENDENT_AMBULATORY_CARE_PROVIDER_SITE_OTHER): Payer: Self-pay | Admitting: Internal Medicine

## 2023-04-15 ENCOUNTER — Other Ambulatory Visit (HOSPITAL_COMMUNITY): Payer: Self-pay

## 2023-04-15 VITALS — BP 121/82 | HR 68 | Temp 97.9°F | Ht 69.0 in | Wt 243.0 lb

## 2023-04-15 DIAGNOSIS — Z6835 Body mass index (BMI) 35.0-35.9, adult: Secondary | ICD-10-CM | POA: Diagnosis not present

## 2023-04-15 DIAGNOSIS — E559 Vitamin D deficiency, unspecified: Secondary | ICD-10-CM

## 2023-04-15 DIAGNOSIS — K580 Irritable bowel syndrome with diarrhea: Secondary | ICD-10-CM | POA: Diagnosis not present

## 2023-04-15 MED ORDER — TOPIRAMATE 25 MG PO TABS
25.0000 mg | ORAL_TABLET | Freq: Every day | ORAL | 0 refills | Status: DC
Start: 2023-04-15 — End: 2023-05-25
  Filled 2023-04-15: qty 30, 30d supply, fill #0

## 2023-04-15 NOTE — Progress Notes (Unsigned)
Office: [phone]  /  Fax: [phone]  WEIGHT SUMMARY AND BIOMETRICS  Vitals Temp: 97.9 F (36.6 C) BP: 121/82 Pulse Rate: 68 SpO2: 98 %   Anthropometric Measurements Height: 5\' 9"  (1.753 m) Weight: 243 lb (110.2 kg) BMI (Calculated): 35.87 Weight at Last Visit: 250 lb Weight Lost Since Last Visit: 7 lb Weight Gained Since Last Visit: 0 lb Starting Weight: 248 lb Total Weight Loss (lbs): 5 lb (2.268 kg) Peak Weight: 260 lb   Body Composition  Body Fat %: 44.2 % Fat Mass (lbs): 107.6 lbs Muscle Mass (lbs): 129 lbs Total Body Water (lbs): 92.6 lbs Visceral Fat Rating : 9    RMR: 2318  Today's Visit #: 3  Starting Date: 03/04/23   HPI  Chief Complaint: OBESITY  Tammy Chapman is here to discuss her progress with her obesity treatment plan. She is on the keto plan and states she is following her eating plan approximately 100 % of the time. She states she is exercising 45 minutes 4-5 times per week.  Interval History:  Since last office visit she has lost 7 lbs. She reports good adherence to reduced calorie nutritional plan. She has been working on [employer]: {ACTIONS;DENIES/REPORTS:21021675::"Denies"} problems with appetite and hunger signals.  {ACTIONS;DENIES/REPORTS:21021675::"Denies"} problems with satiety and satiation.  {ACTIONS;DENIES/REPORTS:21021675::"Denies"} problems with eating patterns and portion control.  {ACTIONS;DENIES/REPORTS:21021675::"Denies"} abnormal cravings. {ACTIONS;DENIES/REPORTS:21021675::"Denies"} feeling deprived or restricted.   Barriers identified: {EMOBESITYBARRIERS:28841}.   Pharmacotherapy for weight loss: She is currently taking {EMPharmaco:28845}.    ASSESSMENT AND PLAN  TREATMENT PLAN FOR OBESITY:  Recommended Dietary Goals  Macaila is currently in the action stage of change. As such, her goal is to continue weight management plan. She has agreed to: {EMWTLOSSPLAN:29297::"continue current  plan"}  Behavioral Intervention  We discussed the following Behavioral Modification Strategies today: {EMWMwtlossstrategies:28914::"increasing lean protein intake","decreasing simple carbohydrates ","increasing vegetables","increasing lower glycemic fruits","increasing water intake","continue to practice mindfulness when eating","planning for success"}.  Additional resources provided today: {EMadditionalresources:29169::"None"}  Recommended Physical Activity Goals  Tanyeka has been advised to work up to 150 minutes of moderate intensity aerobic activity a week and strengthening exercises 2-3 times per week for cardiovascular health, weight loss maintenance and preservation of muscle mass.   She has agreed to :  {EMEXERCISE:28847::"Think about ways to increase daily physical activity and overcoming barriers to exercise"}  Pharmacotherapy We discussed various medication options to help Jazmine with her weight loss efforts and we both agreed to : {EMagreedrx:29170::"continue with nutritional and behavioral strategies"}  ASSOCIATED CONDITIONS ADDRESSED TODAY  There are no diagnoses linked to this encounter.  PHYSICAL EXAM:  Blood pressure 121/82, pulse 68, temperature 97.9 F (36.6 C), height 5\' 9"  (1.753 m), weight 243 lb (110.2 kg), last menstrual period 01/23/2023, SpO2 98%. Body mass index is 35.88 kg/m.  General: She is overweight, cooperative, alert, well developed, and in no acute distress. PSYCH: Has normal mood, affect and thought process.   HEENT: EOMI, sclerae are anicteric. Lungs: Normal breathing effort, no conversational dyspnea. Extremities: No edema.  Neurologic: No gross sensory or motor deficits. No tremors or fasciculations noted.    DIAGNOSTIC DATA REVIEWED:  BMET    Component Value Date/Time   NA 138 03/04/2023 1441   K 3.9 03/04/2023 1441   CL 98 03/04/2023 1441   CO2 24 03/04/2023 1441   GLUCOSE 83 03/04/2023 1441   GLUCOSE 93 09/25/2020 1514   BUN 9  03/04/2023 1441   CREATININE 0.81 03/04/2023 1441   CREATININE 0.54 01/19/2019 1012   CALCIUM  9.0 03/04/2023 1441   GFRNONAA >60 07/02/2019 0602   GFRNONAA 130 01/19/2019 1012   GFRAA >60 07/02/2019 0602   GFRAA 151 01/19/2019 1012   Lab Results  Component Value Date   HGBA1C 5.5 03/04/2023   HGBA1C 5.5 12/17/2022   Lab Results  Component Value Date   INSULIN 11.0 03/04/2023   Lab Results  Component Value Date   TSH 0.88 12/17/2022   CBC    Component Value Date/Time   WBC 6.0 12/17/2022 1513   RBC 5.15 (H) 12/17/2022 1513   HGB 14.7 12/17/2022 1513   HGB 12.0 04/13/2019 0852   HCT 44.0 12/17/2022 1513   HCT 36.2 04/13/2019 0852   PLT 243.0 12/17/2022 1513   PLT 171 04/13/2019 0852   MCV 85.5 12/17/2022 1513   MCV 84 04/13/2019 0852   MCH 24.2 (L) 07/02/2019 0602   MCHC 33.4 12/17/2022 1513   RDW 13.3 12/17/2022 1513   RDW 12.0 04/13/2019 0852   Iron Studies No results found for: "IRON", "TIBC", "FERRITIN", "IRONPCTSAT" Lipid Panel     Component Value Date/Time   CHOL 262 (H) 03/04/2023 1441   TRIG 110 03/04/2023 1441   HDL 62 03/04/2023 1441   CHOLHDL 4.2 03/04/2023 1441   CHOLHDL 4 02/27/2021 1049   VLDL 17.8 02/27/2021 1049   LDLCALC 181 (H) 03/04/2023 1441   Hepatic Function Panel     Component Value Date/Time   PROT 7.1 03/04/2023 1441   ALBUMIN 4.6 03/04/2023 1441   AST 17 03/04/2023 1441   ALT 31 03/04/2023 1441   ALKPHOS 106 03/04/2023 1441   BILITOT 0.9 03/04/2023 1441   BILIDIR 0.1 12/17/2022 1513      Component Value Date/Time   TSH 0.88 12/17/2022 1513   Nutritional Lab Results  Component Value Date   VD25OH 28.1 (L) 03/04/2023     No follow-ups on file.Marland Kitchen She was informed of the importance of frequent follow up visits to maximize her success with intensive lifestyle modifications for her multiple health conditions.   ATTESTASTION STATEMENTS:  Reviewed by clinician on day of visit: allergies, medications, problem list, medical  history, surgical history, family history, social history, and previous encounter notes.     Worthy Rancher, MD

## 2023-04-16 ENCOUNTER — Other Ambulatory Visit: Payer: Self-pay

## 2023-04-16 NOTE — Assessment & Plan Note (Signed)
Most recent vitamin D levels  Lab Results  Component Value Date   VD25OH 28.1 (L) 03/04/2023     Deficiency state associated with adiposity and may result in leptin resistance, weight gain and fatigue.  On high-dose vitamin D without any adverse effects  Plan: Continue high-dose vitamin D supplementation for a total of 4 months.

## 2023-04-16 NOTE — Assessment & Plan Note (Signed)
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  She notes seeing a gastroenterologist and have been tested for celiac disease in the past.  She reports having several family members with celiac disease and also inflammatory bowel disease.  She has not done an elimination diet.  I provided her with a list of fermentable sugars.  I recommend that she track and journal food intake on the days where she is highly symptomatic so so we could rule out a food intolerance.  I also reviewed medications.

## 2023-04-16 NOTE — Assessment & Plan Note (Signed)
 See obesity treatment note

## 2023-04-17 ENCOUNTER — Other Ambulatory Visit (HOSPITAL_COMMUNITY): Payer: Self-pay

## 2023-04-17 ENCOUNTER — Other Ambulatory Visit: Payer: Self-pay

## 2023-04-17 ENCOUNTER — Other Ambulatory Visit: Payer: Self-pay | Admitting: Emergency Medicine

## 2023-04-17 MED ORDER — LEVOCETIRIZINE DIHYDROCHLORIDE 5 MG PO TABS
5.0000 mg | ORAL_TABLET | Freq: Every evening | ORAL | 10 refills | Status: DC
  Filled 2023-04-17: qty 30, 30d supply, fill #0
  Filled 2023-06-18: qty 30, 30d supply, fill #1
  Filled 2023-09-28: qty 30, 30d supply, fill #2
  Filled 2023-10-23 – 2023-10-28 (×3): qty 30, 30d supply, fill #3

## 2023-04-17 MED ORDER — MONTELUKAST SODIUM 10 MG PO TABS
10.0000 mg | ORAL_TABLET | Freq: Every day | ORAL | 10 refills | Status: DC
  Filled 2023-04-17: qty 30, 30d supply, fill #0
  Filled 2023-06-18: qty 30, 30d supply, fill #1
  Filled 2023-09-28: qty 30, 30d supply, fill #2
  Filled 2023-10-23 – 2023-10-28 (×3): qty 30, 30d supply, fill #3

## 2023-04-27 ENCOUNTER — Ambulatory Visit (INDEPENDENT_AMBULATORY_CARE_PROVIDER_SITE_OTHER): Payer: Commercial Managed Care - PPO | Admitting: Nurse Practitioner

## 2023-04-27 ENCOUNTER — Other Ambulatory Visit (HOSPITAL_COMMUNITY): Payer: Self-pay

## 2023-04-27 ENCOUNTER — Encounter: Payer: Self-pay | Admitting: Nurse Practitioner

## 2023-04-27 VITALS — BP 120/88 | HR 79 | Ht 69.0 in | Wt 239.0 lb

## 2023-04-27 DIAGNOSIS — H66002 Acute suppurative otitis media without spontaneous rupture of ear drum, left ear: Secondary | ICD-10-CM | POA: Diagnosis not present

## 2023-04-27 DIAGNOSIS — J4550 Severe persistent asthma, uncomplicated: Secondary | ICD-10-CM

## 2023-04-27 DIAGNOSIS — J301 Allergic rhinitis due to pollen: Secondary | ICD-10-CM

## 2023-04-27 MED ORDER — CEFDINIR 300 MG PO CAPS
300.0000 mg | ORAL_CAPSULE | Freq: Two times a day (BID) | ORAL | 0 refills | Status: AC
Start: 2023-04-27 — End: 2023-05-07
  Filled 2023-04-27: qty 20, 10d supply, fill #0
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
## 2023-04-27 NOTE — Patient Instructions (Addendum)
Continue Airsupra inhaler 2 puffs or albuterol 3 mL neb every 6 hours as needed for shortness of breath or wheezing. Notify if symptoms persist despite rescue inhaler/neb use.  Continue Symbicort 2 puffs Twice daily. Brush tongue and rinse mouth afterwards Continue Spiriva 2 puffs daily  Restart flonase nasal spray 2 sprays each nostril daily Continue Xyzal 1 tab daily Continue singulair 1 tab At bedtime  Restart Tezspire every 28 days  Cefdinir 1 capsule Twice daily for 10 days. Take with food  Use saline rinse 1-2 times a day with bottled distilled water and follow 20-30 minutes later with flonase  Robitussin DM or mucinex DM over the counter for cough/drainage  Let me know if cough doesn't improve with this  Follow up in 4 weeks with Dr. [name] or Katie Armstead Heiland,NP. If symptoms do not improve or worsen, please contact office for sooner follow up or seek emergency care.

## 2023-04-29 ENCOUNTER — Encounter: Payer: Self-pay | Admitting: Nurse Practitioner

## 2023-04-29 DIAGNOSIS — H6690 Otitis media, unspecified, unspecified ear: Secondary | ICD-10-CM | POA: Insufficient documentation

## 2023-04-29 NOTE — Assessment & Plan Note (Signed)
Compensated on current regimen without acute exacerbation. Lung exam clear and FeNO nl. Suspect cough primarily related to postnasal drainage. She will continue triple therapy regimen and biologic therapy. Message sent to rx team to renew authorization. Understands proper administration technique. Action plan in place.  Patient Instructions  Continue Airsupra inhaler 2 puffs or albuterol 3 mL neb every 6 hours as needed for shortness of breath or wheezing. Notify if symptoms persist despite rescue inhaler/neb use.  Continue Symbicort 2 puffs Twice daily. Brush tongue and rinse mouth afterwards Continue Spiriva 2 puffs daily  Restart flonase nasal spray 2 sprays each nostril daily Continue Xyzal 1 tab daily Continue singulair 1 tab At bedtime  Restart Tezspire every 28 days  Cefdinir 1 capsule Twice daily for 10 days. Take with food  Use saline rinse 1-2 times a day with bottled distilled water and follow 20-30 minutes later with flonase  Robitussin DM or mucinex DM over the counter for cough/drainage  Let me know if cough doesn't improve with this  Follow up in 4 weeks with Dr. [name] or Katie Chancy Smigiel,NP. If symptoms do not improve or worsen, please contact office for sooner follow up or seek emergency care.

## 2023-04-29 NOTE — Assessment & Plan Note (Addendum)
Left suppurative AOM. Right serous otitis. TMs intact. Will treat with empiric cefdinir x 10 days. She has an allergy to PCN but tolerates cephalosporins without difficulty. Target sinus symptoms.

## 2023-04-29 NOTE — Assessment & Plan Note (Signed)
Flare related to allergies vs URI. Supportive care measures. See above.

## 2023-04-30 ENCOUNTER — Other Ambulatory Visit (HOSPITAL_COMMUNITY): Payer: Self-pay

## 2023-04-30 MED ORDER — TEZSPIRE 210 MG/1.91ML ~~LOC~~ SOAJ
210.0000 mg | SUBCUTANEOUS | 5 refills | Status: DC
Start: 2023-04-30 — End: 2023-05-04
  Filled 2023-04-30: qty 1.91, 28d supply, fill #0

## 2023-04-30 NOTE — Telephone Encounter (Signed)
Submitted an URGENT Prior Authorization request to [facility] for TEZSPIRE via CoverMyMeds. Will update once we receive a response.  Key: [id number]   Refill sent to [facility] today  Chesley Mires, PharmD, MPH, BCPS, CPP Clinical Pharmacist (Rheumatology and Pulmonology)

## 2023-04-30 NOTE — Telephone Encounter (Signed)
Received notification from [facility] regarding a prior authorization for TEZSPIRE. Authorization has been APPROVED from 04/30/2023 to 04/29/2024. Approval letter sent to scan center.  Patient must continue to fill through [facility]: [phone]   Authorization # [id number]  [employer] updated  Chesley Mires, PharmD, MPH, BCPS, CPP Clinical Pharmacist (Rheumatology and Pulmonology)

## 2023-05-04 ENCOUNTER — Other Ambulatory Visit (HOSPITAL_COMMUNITY): Payer: Self-pay

## 2023-05-04 ENCOUNTER — Other Ambulatory Visit: Payer: Self-pay

## 2023-05-04 ENCOUNTER — Other Ambulatory Visit: Payer: Self-pay | Admitting: Pharmacist

## 2023-05-04 DIAGNOSIS — J4551 Severe persistent asthma with (acute) exacerbation: Secondary | ICD-10-CM

## 2023-05-04 MED ORDER — TEZSPIRE 210 MG/1.91ML ~~LOC~~ SOAJ
210.0000 mg | SUBCUTANEOUS | 5 refills | Status: DC
Start: 2023-05-04 — End: 2023-10-19
  Filled 2023-05-04: qty 1.91, 28d supply, fill #0
  Filled 2023-05-28: qty 1.91, 28d supply, fill #1
  Filled 2023-06-18: qty 1.91, 28d supply, fill #2
  Filled 2023-07-24: qty 1.91, 28d supply, fill #3
  Filled 2023-08-24: qty 1.91, 28d supply, fill #4
  Filled 2023-09-18: qty 1.91, 28d supply, fill #5

## 2023-05-07 ENCOUNTER — Other Ambulatory Visit (HOSPITAL_COMMUNITY): Payer: Self-pay

## 2023-05-25 ENCOUNTER — Encounter (INDEPENDENT_AMBULATORY_CARE_PROVIDER_SITE_OTHER): Payer: Self-pay | Admitting: Internal Medicine

## 2023-05-25 ENCOUNTER — Other Ambulatory Visit: Payer: Self-pay

## 2023-05-25 ENCOUNTER — Ambulatory Visit (INDEPENDENT_AMBULATORY_CARE_PROVIDER_SITE_OTHER): Payer: Commercial Managed Care - PPO | Admitting: Internal Medicine

## 2023-05-25 VITALS — BP 124/78 | HR 82 | Temp 98.2°F | Ht 69.0 in | Wt 238.0 lb

## 2023-05-25 DIAGNOSIS — Z6836 Body mass index (BMI) 36.0-36.9, adult: Secondary | ICD-10-CM

## 2023-05-25 DIAGNOSIS — R638 Other symptoms and signs concerning food and fluid intake: Secondary | ICD-10-CM | POA: Diagnosis not present

## 2023-05-25 DIAGNOSIS — E66812 Obesity, class 2: Secondary | ICD-10-CM

## 2023-05-25 DIAGNOSIS — E282 Polycystic ovarian syndrome: Secondary | ICD-10-CM | POA: Diagnosis not present

## 2023-05-25 MED ORDER — TOPIRAMATE 50 MG PO TABS
50.0000 mg | ORAL_TABLET | Freq: Every day | ORAL | 0 refills | Status: DC
Start: 2023-05-25 — End: 2023-06-15
  Filled 2023-05-25: qty 30, 30d supply, fill #0

## 2023-05-25 NOTE — Assessment & Plan Note (Signed)
We discussed that PCOS of many subtypes and that not all of them are associated with insulin resistance and weight gain.  She wonders if this is affecting her weight loss rate.  We discussed that weight loss rate is dependent on multiple factors we also discussed managing expectations and that weight loss will be gradual but sustainable.

## 2023-05-25 NOTE — Assessment & Plan Note (Signed)
Now on topiramate 25 mg once a day.  She has not noticed an improvement in orixegenic signaling.  We will increase medication to 50 mg once a day.  We reviewed medication safety and teratogenic effects.  She reports having reliable means of birth control.

## 2023-05-25 NOTE — Assessment & Plan Note (Signed)
See obesity treatment plan.  She did not tolerate ketogenic diet as she realized she needs to have more carbs.  We discussed possibly referring her to our dietitian for more tailored approach but we also instructed her on how to create a tailored plan using artificial intelligence which she can modify based on her food likes and easier to adhere to.  We also discussed the benefits of tracking and journaling which she agrees with with demonstration on how to use my net diary.

## 2023-05-25 NOTE — Progress Notes (Signed)
[address] [place], [place], [place] [address] Office: [phone]  /  Fax: [phone]   WEIGHT SUMMARY AND BIOMETRICS  Vitals Temp: 98.2 F (36.8 C) BP: 124/78 Pulse Rate: 82 SpO2: 98 %   Anthropometric Measurements Height: 5\' 9"  (1.753 m) Weight at Last Visit: 238 lb lb Weight Lost Since Last Visit: 3 lb Weight Gained Since Last Visit: 0 lb Starting Weight: 248 lb Total Weight Loss (lbs): 8 lb (3.629 kg) Peak Weight: 260 lb   Body Composition  Body Fat %: 43.1 % Fat Mass (lbs): 102.8 lbs Muscle Mass (lbs): 129 lbs Total Body Water (lbs): 89 lbs Visceral Fat Rating : 9     RMR: 2318  Today's Visit #: 4   Starting Date: 03/04/23    Chief Complaint: OBESITY  HPI  Discussed the use of AI scribe software for clinical note transcription with the patient, who gave verbal consent to proceed.  History of Present Illness    Patient presents today for follow-up on weight management.  She reports following ketogenic plan about 75% of the time and was active while vacationing at the beach.  Since last office visit she has lost 3 pounds.  The patient has been following the ketogenic diet for a couple of months but reports that it may not be suitable for her due to instances of hypoglycemia, which have led to near fainting episodes. She attributes these episodes to a lack of sufficient carbohydrate intake, as her blood sugar levels tend to be on the lower side. She expresses a need for a diet plan with a slightly higher carbohydrate content.  Despite not being a regular exerciser, the patient reports a period of increased physical activity during a two-week beach vacation where she engaged in running, playing, and swimming. However, she also mentions that her ability to exercise has been limited due to the need to care for her father, who has been in poor health and required an amputation earlier in the year.  The patient describes herself as a picky eater, with a  preference for proteins and a dislike for raw or crunchy vegetables. She reports that she has always been a big fruit person and will eat any vegetable as long as it is cooked to a soft consistency. She also mentions that she has a food scale at home and is comfortable with measuring her food portions.  Regarding the topiramate medication, the patient feels that the current dose may be too low and reports an increase in migraines. She takes the medication during the day and does not experience any somnolence or sleepiness. She also mentions that she has not had a menstrual period since June due to her PCOS, and wonders if this could be affecting her weight loss progress.  The patient acknowledges that she has lost weight but expresses frustration at the slow pace of weight loss. She reports a change in clothing size from extra large to large and is pleased with this progress. She also mentions that she tries to go for walks around her neighborhood when possible, usually covering about a mile.      Barriers identified: limited food variation or intolerances and low volume of physical acitivity.   Pharmacotherapy for weight loss: She is currently taking Topiramate (off label use, single agent) without clinical response and without side effects..    ASSESSMENT AND PLAN  TREATMENT PLAN FOR OBESITY:  Recommended Dietary Goals  Tammy Chapman is currently in the action stage of change. As such, her goal  is to continue weight management plan. She has agreed to: keep a food journal with a target of  1300 calories and 30-40 grams of protein per meal and follow a tailored, multi-day, low carbohydrate, high protein plan targeting 1300 calories and 90-100 grams of protein per day  Behavioral Intervention  We discussed the following Behavioral Modification Strategies today: continue to work on maintaining a reduced calorie state, getting the recommended amount of protein, incorporating whole foods, making healthy  choices, staying well hydrated and practicing mindfulness when eating..  Additional resources provided today: Handout and personalized instruction on tracking and journaling using Apps and Personalized instruction on the use of artificial intelligence for recipes, calorie tracking, and finding healthier options when eating out.   Recommended Physical Activity Goals  Tammy Chapman has been advised to work up to 150 minutes of moderate intensity aerobic activity a week and strengthening exercises 2-3 times per week for cardiovascular health, weight loss maintenance and preservation of muscle mass.   She has agreed to :  Think about enjoyable ways to increase daily physical activity and overcoming barriers to exercise and Increase physical activity in their day and reduce sedentary time (increase NEAT).  Pharmacotherapy We discussed various medication options to help Wayne with her weight loss efforts and we both agreed to : increase topiramate to 50 mg once a day.  Patient reports using reliable methods of birth control.  ASSOCIATED CONDITIONS ADDRESSED TODAY  PCOS (polycystic ovarian syndrome) Assessment & Plan: We discussed that PCOS of many subtypes and that not all of them are associated with insulin resistance and weight gain.  She wonders if this is affecting her weight loss rate.  We discussed that weight loss rate is dependent on multiple factors we also discussed managing expectations and that weight loss will be gradual but sustainable.   Class 2 severe obesity with serious comorbidity and body mass index (BMI) of 36.0 to 36.9 in adult, unspecified obesity type [facility]) Assessment & Plan: See obesity treatment plan.  She did not tolerate ketogenic diet as she realized she needs to have more carbs.  We discussed possibly referring her to our dietitian for more tailored approach but we also instructed her on how to create a tailored plan using artificial intelligence which she can modify based on  her food likes and easier to adhere to.  We also discussed the benefits of tracking and journaling which she agrees with with demonstration on how to use my net diary.  Orders: -     Topiramate; Take 1 tablet (50 mg total) by mouth daily.  Dispense: 30 tablet; Refill: 0  Abnormal food appetite Assessment & Plan: Now on topiramate 25 mg once a day.  She has not noticed an improvement in orixegenic signaling.  We will increase medication to 50 mg once a day.  We reviewed medication safety and teratogenic effects.  She reports having reliable means of birth control.     PHYSICAL EXAM:  Blood pressure 124/78, pulse 82, temperature 98.2 F (36.8 C), height 5\' 9"  (1.753 m), last menstrual period 01/27/2023, SpO2 98%. Body mass index is 35.29 kg/m.  General: She is overweight, cooperative, alert, well developed, and in no acute distress. PSYCH: Has normal mood, affect and thought process.   HEENT: EOMI, sclerae are anicteric. Lungs: Normal breathing effort, no conversational dyspnea. Extremities: No edema.  Neurologic: No gross sensory or motor deficits. No tremors or fasciculations noted.    DIAGNOSTIC DATA REVIEWED:  BMET    Component Value Date/Time  NA 138 03/04/2023 1441   K 3.9 03/04/2023 1441   CL 98 03/04/2023 1441   CO2 24 03/04/2023 1441   GLUCOSE 83 03/04/2023 1441   GLUCOSE 93 09/25/2020 1514   BUN 9 03/04/2023 1441   CREATININE 0.81 03/04/2023 1441   CREATININE 0.54 01/19/2019 1012   CALCIUM 9.0 03/04/2023 1441   GFRNONAA >60 07/02/2019 0602   GFRNONAA 130 01/19/2019 1012   GFRAA >60 07/02/2019 0602   GFRAA 151 01/19/2019 1012   Lab Results  Component Value Date   HGBA1C 5.5 03/04/2023   HGBA1C 5.5 12/17/2022   Lab Results  Component Value Date   INSULIN 11.0 03/04/2023   Lab Results  Component Value Date   TSH 0.88 12/17/2022   CBC    Component Value Date/Time   WBC 6.0 12/17/2022 1513   RBC 5.15 (H) 12/17/2022 1513   HGB 14.7 12/17/2022 1513    HGB 12.0 04/13/2019 0852   HCT 44.0 12/17/2022 1513   HCT 36.2 04/13/2019 0852   PLT 243.0 12/17/2022 1513   PLT 171 04/13/2019 0852   MCV 85.5 12/17/2022 1513   MCV 84 04/13/2019 0852   MCH 24.2 (L) 07/02/2019 0602   MCHC 33.4 12/17/2022 1513   RDW 13.3 12/17/2022 1513   RDW 12.0 04/13/2019 0852   Iron Studies No results found for: "IRON", "TIBC", "FERRITIN", "IRONPCTSAT" Lipid Panel     Component Value Date/Time   CHOL 262 (H) 03/04/2023 1441   TRIG 110 03/04/2023 1441   HDL 62 03/04/2023 1441   CHOLHDL 4.2 03/04/2023 1441   CHOLHDL 4 02/27/2021 1049   VLDL 17.8 02/27/2021 1049   LDLCALC 181 (H) 03/04/2023 1441   Lab Results  Component Value Date   CHOL 262 (H) 03/04/2023   HDL 62 03/04/2023   LDLCALC 181 (H) 03/04/2023   TRIG 110 03/04/2023   Hepatic Function Panel     Component Value Date/Time   PROT 7.1 03/04/2023 1441   ALBUMIN 4.6 03/04/2023 1441   AST 17 03/04/2023 1441   ALT 31 03/04/2023 1441   ALKPHOS 106 03/04/2023 1441   BILITOT 0.9 03/04/2023 1441   BILIDIR 0.1 12/17/2022 1513      Component Value Date/Time   TSH 0.88 12/17/2022 1513   Nutritional Lab Results  Component Value Date   VD25OH 28.1 (L) 03/04/2023     Return in about 3 weeks (around 06/15/2023) for For Weight Mangement with Dr. Rikki Spearing.Marland Kitchen She was informed of the importance of frequent follow up visits to maximize her success with intensive lifestyle modifications for her multiple health conditions.   ATTESTASTION STATEMENTS:  Reviewed by clinician on day of visit: allergies, medications, problem list, medical history, surgical history, family history, social history, and previous encounter notes.     Worthy Rancher, MD

## 2023-05-26 ENCOUNTER — Other Ambulatory Visit: Payer: Self-pay

## 2023-05-28 ENCOUNTER — Other Ambulatory Visit: Payer: Self-pay

## 2023-05-28 NOTE — Progress Notes (Signed)
Specialty Pharmacy Ongoing Clinical Assessment Note  Caeleigh Prohaska is a 32 y.o. female who is being followed by the specialty pharmacy service for RxSp Asthma/COPD   Patient's specialty medication(s) reviewed today: Tezepelumab-Ekko   Missed doses in the last 4 weeks: 0   Patient/Caregiver did not have any additional questions or concerns.   Therapeutic benefit summary: Patient is achieving benefit   Adverse events/side effects summary: No adverse events/side effects   Patient's therapy is appropriate to: Continue    Goals Addressed             This Visit's Progress    Reduce signs and symptoms       Patient is on track. Patient will maintain adherence and avoid flare triggers. Patient missed dose in August and had to delay dose in September due to an "issue between her and provider office". She did experience some symptoms which required increased use of rescue inhaler but has resolved since September dose.          Follow up:  6 months  Otto Herb Specialty Pharmacist

## 2023-05-28 NOTE — Progress Notes (Signed)
Specialty Pharmacy Refill Coordination Note  Tammy Chapman is a 32 y.o. female contacted today regarding refills of specialty medication(s) [facility]   Patient requested Delivery   Delivery date: 06/02/23   Verified address: [address] [place] [address]   Medication will be filled on 06/01/23.

## 2023-06-15 ENCOUNTER — Encounter (INDEPENDENT_AMBULATORY_CARE_PROVIDER_SITE_OTHER): Payer: Self-pay | Admitting: Internal Medicine

## 2023-06-15 ENCOUNTER — Ambulatory Visit (INDEPENDENT_AMBULATORY_CARE_PROVIDER_SITE_OTHER): Payer: Commercial Managed Care - PPO | Admitting: Internal Medicine

## 2023-06-15 VITALS — BP 110/74 | HR 95 | Temp 97.9°F | Ht 69.0 in | Wt 237.0 lb

## 2023-06-15 DIAGNOSIS — R638 Other symptoms and signs concerning food and fluid intake: Secondary | ICD-10-CM

## 2023-06-15 DIAGNOSIS — Z6836 Body mass index (BMI) 36.0-36.9, adult: Secondary | ICD-10-CM | POA: Diagnosis not present

## 2023-06-15 DIAGNOSIS — E66812 Obesity, class 2: Secondary | ICD-10-CM

## 2023-06-15 MED ORDER — TOPIRAMATE 50 MG PO TABS
50.0000 mg | ORAL_TABLET | Freq: Every day | ORAL | 0 refills | Status: DC
Start: 2023-06-15 — End: 2023-07-28
  Filled 2023-06-15: qty 30, 30d supply, fill #0

## 2023-06-15 MED ORDER — VITAMIN D (ERGOCALCIFEROL) 1.25 MG (50000 UNIT) PO CAPS
50000.0000 [IU] | ORAL_CAPSULE | ORAL | 0 refills | Status: DC
  Filled 2023-06-15: qty 12, 84d supply, fill #0

## 2023-06-15 NOTE — Progress Notes (Unsigned)
Office: [phone]  /  Fax: [phone]  WEIGHT SUMMARY AND BIOMETRICS  Vitals Temp: 97.9 F (36.6 C) BP: 110/74 Pulse Rate: 95 SpO2: 98 %   Anthropometric Measurements Height: 5\' 9"  (1.753 m) Weight: 237 lb (107.5 kg) BMI (Calculated): 34.98 Weight at Last Visit: 238 lb Weight Lost Since Last Visit: 1 lb Weight Gained Since Last Visit: 0 lb Starting Weight: 248 lb Total Weight Loss (lbs): 11 lb (4.99 kg) Peak Weight: 260 lb   Body Composition  Body Fat %: 42.9 % Fat Mass (lbs): 102 lbs Muscle Mass (lbs): 129 lbs Total Body Water (lbs): 89.2 lbs Visceral Fat Rating : 9    RMR: 2318  Today's Visit #: 5  Starting Date: 03/04/23   HPI  Chief Complaint: OBESITY  Calvin is here to discuss her progress with her obesity treatment plan. She is on the keeping a food journal and adhering to recommended goals of 1300 calories and 90-100 protein and states she is following her eating plan approximately 100 % of the time. She states she is exercising 30 minutes 3 times per week.  Interval History:  Since last office visit she has lost 1 pounds. She reports fair adherence to reduced calorie nutritional plan. She has been working on reading food labels, not skipping meals, increasing protein intake at every meal, drinking more water, making healthier choices, and reducing portion sizes  Orexigenic Control: Denies problems with appetite and hunger signals.  Denies problems with satiety and satiation.  Denies problems with eating patterns and portion control.  Denies abnormal cravings. Denies feeling deprived or restricted.   Barriers identified: cost of healthy foods and inadequate sleep.   Pharmacotherapy for weight loss: She is currently taking Topiramate (off label use, single agent) with adequate clinical response  and without side effects..    ASSESSMENT AND PLAN  TREATMENT PLAN FOR OBESITY:  Recommended Dietary Goals  Aviance is currently in the action  stage of change. As such, her goal is to continue weight management plan. She has agreed to: {EMWTLOSSPLAN:29297::"continue current plan"}  Behavioral Intervention+  We discussed the following Behavioral Modification Strategies today: {EMWMwtlossstrategies:28914::"continue to work on maintaining a reduced calorie state, getting the recommended amount of protein, incorporating whole foods, making healthy choices, staying well hydrated and practicing mindfulness when eating."}.  Additional resources provided today: {EMadditionalresources:29169::"None"}  Recommended Physical Activity Goals  Evolette has been advised to work up to 150 minutes of moderate intensity aerobic activity a week and strengthening exercises 2-3 times per week for cardiovascular health, weight loss maintenance and preservation of muscle mass.   She has agreed to :  {EMEXERCISE:28847::"Think about enjoyable ways to increase daily physical activity and overcoming barriers to exercise","Increase physical activity in their day and reduce sedentary time (increase NEAT)."}  Pharmacotherapy We discussed various medication options to help Clyde with her weight loss efforts and we both agreed to : {EMagreedrx:29170::"continue with nutritional and behavioral strategies"}  ASSOCIATED CONDITIONS ADDRESSED TODAY  Class 2 severe obesity with serious comorbidity and body mass index (BMI) of 36.0 to 36.9 in adult, unspecified obesity type (HCC)    PHYSICAL EXAM:  Blood pressure 110/74, pulse 95, temperature 97.9 F (36.6 C), height 5\' 9"  (1.753 m), weight 237 lb (107.5 kg), last menstrual period 01/27/2023, SpO2 98%. Body mass index is 35 kg/m.  General: She is overweight, cooperative, alert, well developed, and in no acute distress. PSYCH: Has normal mood, affect and thought process.   HEENT: EOMI, sclerae are anicteric. Lungs: Normal breathing effort,  no conversational dyspnea. Extremities: No edema.  Neurologic: No gross  sensory or motor deficits. No tremors or fasciculations noted.    DIAGNOSTIC DATA REVIEWED:  BMET    Component Value Date/Time   NA 138 03/04/2023 1441   K 3.9 03/04/2023 1441   CL 98 03/04/2023 1441   CO2 24 03/04/2023 1441   GLUCOSE 83 03/04/2023 1441   GLUCOSE 93 09/25/2020 1514   BUN 9 03/04/2023 1441   CREATININE 0.81 03/04/2023 1441   CREATININE 0.54 01/19/2019 1012   CALCIUM 9.0 03/04/2023 1441   GFRNONAA >60 07/02/2019 0602   GFRNONAA 130 01/19/2019 1012   GFRAA >60 07/02/2019 0602   GFRAA 151 01/19/2019 1012   Lab Results  Component Value Date   HGBA1C 5.5 03/04/2023   HGBA1C 5.5 12/17/2022   Lab Results  Component Value Date   INSULIN 11.0 03/04/2023   Lab Results  Component Value Date   TSH 0.88 12/17/2022   CBC    Component Value Date/Time   WBC 6.0 12/17/2022 1513   RBC 5.15 (H) 12/17/2022 1513   HGB 14.7 12/17/2022 1513   HGB 12.0 04/13/2019 0852   HCT 44.0 12/17/2022 1513   HCT 36.2 04/13/2019 0852   PLT 243.0 12/17/2022 1513   PLT 171 04/13/2019 0852   MCV 85.5 12/17/2022 1513   MCV 84 04/13/2019 0852   MCH 24.2 (L) 07/02/2019 0602   MCHC 33.4 12/17/2022 1513   RDW 13.3 12/17/2022 1513   RDW 12.0 04/13/2019 0852   Iron Studies No results found for: "IRON", "TIBC", "FERRITIN", "IRONPCTSAT" Lipid Panel     Component Value Date/Time   CHOL 262 (H) 03/04/2023 1441   TRIG 110 03/04/2023 1441   HDL 62 03/04/2023 1441   CHOLHDL 4.2 03/04/2023 1441   CHOLHDL 4 02/27/2021 1049   VLDL 17.8 02/27/2021 1049   LDLCALC 181 (H) 03/04/2023 1441   Hepatic Function Panel     Component Value Date/Time   PROT 7.1 03/04/2023 1441   ALBUMIN 4.6 03/04/2023 1441   AST 17 03/04/2023 1441   ALT 31 03/04/2023 1441   ALKPHOS 106 03/04/2023 1441   BILITOT 0.9 03/04/2023 1441   BILIDIR 0.1 12/17/2022 1513      Component Value Date/Time   TSH 0.88 12/17/2022 1513   Nutritional Lab Results  Component Value Date   VD25OH 28.1 (L) 03/04/2023      No follow-ups on file.Marland Kitchen She was informed of the importance of frequent follow up visits to maximize her success with intensive lifestyle modifications for her multiple health conditions.   ATTESTASTION STATEMENTS:  Reviewed by clinician on day of visit: allergies, medications, problem list, medical history, surgical history, family history, social history, and previous encounter notes.     Worthy Rancher, MD

## 2023-06-16 ENCOUNTER — Ambulatory Visit (INDEPENDENT_AMBULATORY_CARE_PROVIDER_SITE_OTHER): Payer: Commercial Managed Care - PPO | Admitting: Nurse Practitioner

## 2023-06-16 ENCOUNTER — Other Ambulatory Visit (HOSPITAL_COMMUNITY): Payer: Self-pay

## 2023-06-16 ENCOUNTER — Ambulatory Visit (HOSPITAL_COMMUNITY)
Admission: RE | Admit: 2023-06-16 | Discharge: 2023-06-16 | Disposition: A | Discharge status: Home / Self Care | Payer: Commercial Managed Care - PPO | Source: Ambulatory Visit | Attending: Nurse Practitioner | Admitting: Nurse Practitioner

## 2023-06-16 ENCOUNTER — Encounter: Payer: Self-pay | Admitting: Nurse Practitioner

## 2023-06-16 ENCOUNTER — Other Ambulatory Visit: Payer: Self-pay

## 2023-06-16 VITALS — BP 109/76 | HR 92 | Ht 69.0 in | Wt 237.0 lb

## 2023-06-16 DIAGNOSIS — J4551 Severe persistent asthma with (acute) exacerbation: Secondary | ICD-10-CM | POA: Insufficient documentation

## 2023-06-16 DIAGNOSIS — R509 Fever, unspecified: Secondary | ICD-10-CM | POA: Diagnosis not present

## 2023-06-16 DIAGNOSIS — J329 Chronic sinusitis, unspecified: Secondary | ICD-10-CM | POA: Insufficient documentation

## 2023-06-16 DIAGNOSIS — J40 Bronchitis, not specified as acute or chronic: Secondary | ICD-10-CM

## 2023-06-16 DIAGNOSIS — R059 Cough, unspecified: Secondary | ICD-10-CM | POA: Diagnosis not present

## 2023-06-16 DIAGNOSIS — R0602 Shortness of breath: Secondary | ICD-10-CM | POA: Diagnosis not present

## 2023-06-16 MED ORDER — METHYLPREDNISOLONE ACETATE 80 MG/ML IJ SUSP
80.0000 mg | Freq: Once | INTRAMUSCULAR | Status: AC
Start: 2023-06-16 — End: 2023-06-16
  Administered 2023-06-16: 80 mg via INTRAMUSCULAR

## 2023-06-16 MED ORDER — BENZONATATE 200 MG PO CAPS: 200.0000 mg | ORAL_CAPSULE | Freq: Three times a day (TID) | ORAL | 1 refills | Status: DC | PRN

## 2023-06-16 MED ORDER — PREDNISONE 10 MG PO TABS
ORAL_TABLET | ORAL | 0 refills | Status: DC
Start: 2023-06-16 — End: 2023-06-29

## 2023-06-16 MED ORDER — PROMETHAZINE-DM 6.25-15 MG/5ML PO SYRP: 5.0000 mL | ORAL_SOLUTION | Freq: Four times a day (QID) | ORAL | 0 refills | Status: DC | PRN

## 2023-06-16 MED ORDER — CEFDINIR 300 MG PO CAPS
300.0000 mg | ORAL_CAPSULE | Freq: Two times a day (BID) | ORAL | 0 refills | Status: AC
Start: 2023-06-16 — End: 2023-06-26

## 2023-06-16 NOTE — Assessment & Plan Note (Signed)
Acute exacerbation with sinobronchitis. Out of the window for viral testing. CXR today given new onset fevers and worsening respiratory symptoms. Will treat her with empiric cefdinir course and prednisone taper. Depo 80 mg inj x 1 in office. Cough control measures. Target sinus symptoms with saline rinses and intranasal steroid. On aggressive maintenance regimen. Continue triple therapy regimen and biologic therapy. Action plan in place. Close follow up. Strict return precautions.   Patient Instructions  Continue Airsupra inhaler 2 puffs or albuterol 3 mL neb every 6 hours as needed for shortness of breath or wheezing. Notify if symptoms persist despite rescue inhaler/neb use.  Continue Symbicort 2 puffs Twice daily. Brush tongue and rinse mouth afterwards Continue Spiriva 2 puffs daily  Restart flonase nasal spray 2 sprays each nostril daily Continue Xyzal 1 tab daily Continue singulair 1 tab At bedtime  Continue Tezspire every 28 days   -Cefdinir 1 capsule Twice daily for 10 days. Take with food  -Use saline rinse 1-2 times a day with bottled distilled water and follow 20-30 minutes later with flonase  -Prednisone taper. 4 tabs for 2 days, then 3 tabs for 2 days, 2 tabs for 2 days, then 1 tab for 2 days, then stop. Take in AM with food. Start tomorrow -Promethazine DM cough syrup 5 mL every 6 hours as needed for cough. Do not drive after taking. May cause grogginess -Benzonatate 1 capsule Three times a day for cough   Chest x ray today  Steroid shot today    Let me know if cough doesn't improve with this   Follow up in 4 weeks with Dr. [name] or Katie Monti Jilek,NP. If symptoms do not improve or worsen, please contact office for sooner follow up or seek emergency care.

## 2023-06-16 NOTE — Assessment & Plan Note (Signed)
Several psychosocial economical barriers identified.  Counseling and support provided today
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
## 2023-06-16 NOTE — Assessment & Plan Note (Signed)
See above

## 2023-06-16 NOTE — Assessment & Plan Note (Signed)
Improved on topiramate without any adverse effects.  She will continue at current dose we will consider phentermine in the morning as adjunct.

## 2023-06-16 NOTE — Patient Instructions (Addendum)
Continue Airsupra inhaler 2 puffs or albuterol 3 mL neb every 6 hours as needed for shortness of breath or wheezing. Notify if symptoms persist despite rescue inhaler/neb use.  Continue Symbicort 2 puffs Twice daily. Brush tongue and rinse mouth afterwards Continue Spiriva 2 puffs daily  Restart flonase nasal spray 2 sprays each nostril daily Continue Xyzal 1 tab daily Continue singulair 1 tab At bedtime  Continue Tezspire every 28 days   -Cefdinir 1 capsule Twice daily for 10 days. Take with food  -Use saline rinse 1-2 times a day with bottled distilled water and follow 20-30 minutes later with flonase  -Prednisone taper. 4 tabs for 2 days, then 3 tabs for 2 days, 2 tabs for 2 days, then 1 tab for 2 days, then stop. Take in AM with food. Start tomorrow -Promethazine DM cough syrup 5 mL every 6 hours as needed for cough. Do not drive after taking. May cause grogginess -Benzonatate 1 capsule Three times a day for cough   Chest x ray today  Steroid shot today    Let me know if cough doesn't improve with this   Follow up in 4 weeks with Dr. [name] or Katie Aniella Wandrey,NP. If symptoms do not improve or worsen, please contact office for sooner follow up or seek emergency care.

## 2023-06-17 NOTE — Progress Notes (Signed)
Notified pt of results. NFN

## 2023-06-18 ENCOUNTER — Other Ambulatory Visit (HOSPITAL_COMMUNITY): Payer: Self-pay | Admitting: Pharmacy Technician

## 2023-06-18 ENCOUNTER — Other Ambulatory Visit (HOSPITAL_COMMUNITY): Payer: Self-pay

## 2023-06-18 NOTE — Progress Notes (Signed)
Specialty Pharmacy Refill Coordination Note  Alleyah Colley is a 32 y.o. female contacted today regarding refills of specialty medication(s) Tezepelumab-Ekko   Patient requested No data recorded  Delivery date: 07/01/23   Verified address: [address] [place]   Medication will be filled on 06/30/23.

## 2023-06-26 ENCOUNTER — Telehealth: Payer: Commercial Managed Care - PPO | Admitting: Physician Assistant

## 2023-06-26 DIAGNOSIS — T3695XA Adverse effect of unspecified systemic antibiotic, initial encounter: Secondary | ICD-10-CM | POA: Diagnosis not present

## 2023-06-26 DIAGNOSIS — B379 Candidiasis, unspecified: Secondary | ICD-10-CM

## 2023-06-26 MED ORDER — FLUCONAZOLE 150 MG PO TABS: 150.0000 mg | ORAL_TABLET | ORAL | 0 refills | Status: DC | PRN

## 2023-06-26 NOTE — Progress Notes (Signed)

## 2023-06-29 ENCOUNTER — Telehealth: Payer: Self-pay

## 2023-06-29 ENCOUNTER — Other Ambulatory Visit (HOSPITAL_COMMUNITY): Payer: Self-pay

## 2023-06-29 DIAGNOSIS — J4551 Severe persistent asthma with (acute) exacerbation: Secondary | ICD-10-CM

## 2023-06-29 DIAGNOSIS — J301 Allergic rhinitis due to pollen: Secondary | ICD-10-CM

## 2023-06-29 MED ORDER — PREDNISONE 10 MG PO TABS
ORAL_TABLET | ORAL | 0 refills | Status: AC
Start: 2023-06-29 — End: 2023-07-20
  Filled 2023-06-29: qty 50, 20d supply, fill #0

## 2023-06-29 MED ORDER — AZELASTINE-FLUTICASONE 137-50 MCG/ACT NA SUSP
1.0000 | Freq: Two times a day (BID) | NASAL | 2 refills | Status: DC
Start: 2023-06-29 — End: 2023-10-15
  Filled 2023-06-29: qty 23, 30d supply, fill #0

## 2023-06-29 NOTE — Telephone Encounter (Addendum)
Cough, mostly dry; denies congestion, fever, chills; does report occasional post-tussive emesis.    PLEASE FIX HER ;-)  Thank you!

## 2023-06-30 ENCOUNTER — Other Ambulatory Visit (HOSPITAL_COMMUNITY): Payer: Self-pay

## 2023-06-30 ENCOUNTER — Other Ambulatory Visit: Payer: Self-pay

## 2023-07-06 ENCOUNTER — Telehealth: Payer: Self-pay

## 2023-07-06 ENCOUNTER — Other Ambulatory Visit (HOSPITAL_COMMUNITY): Payer: Self-pay

## 2023-07-06 NOTE — Telephone Encounter (Signed)
*  Pulm  Pharmacy Patient Advocate Encounter   Received notification from CoverMyMeds that prior authorization for Azelastine-Fluticasone 137-50MCG/ACT suspension  is required/requested.   Insurance verification completed.   The patient is insured through [facility] .   Per test claim: PA required; PA submitted to above mentioned insurance via CoverMyMeds Key/confirmation #/EOC [id number] Status is pending

## 2023-07-08 ENCOUNTER — Other Ambulatory Visit (HOSPITAL_COMMUNITY): Payer: Self-pay

## 2023-07-08 NOTE — Telephone Encounter (Signed)
Pharmacy Patient Advocate Encounter  Received notification from [facility] that Prior Authorization for Azelastine-Fluticasone 137-50MCG/ACT has been APPROVED from 07/06/2023 to 07/03/2024. Ran test claim, Copay is $5.00. This test claim was processed through [facility]- copay amounts may vary at other pharmacies due to pharmacy/plan contracts, or as the patient moves through the different stages of their insurance plan.

## 2023-07-20 ENCOUNTER — Ambulatory Visit (INDEPENDENT_AMBULATORY_CARE_PROVIDER_SITE_OTHER): Payer: Commercial Managed Care - PPO | Admitting: Internal Medicine

## 2023-07-22 ENCOUNTER — Other Ambulatory Visit: Payer: Self-pay

## 2023-07-24 ENCOUNTER — Other Ambulatory Visit (HOSPITAL_COMMUNITY): Payer: Self-pay | Admitting: Pharmacy Technician

## 2023-07-24 ENCOUNTER — Other Ambulatory Visit (HOSPITAL_COMMUNITY): Payer: Self-pay

## 2023-07-24 ENCOUNTER — Other Ambulatory Visit: Payer: Self-pay

## 2023-07-24 NOTE — Progress Notes (Signed)
Specialty Pharmacy Refill Coordination Note  Tammy Chapman is a 32 y.o. female contacted today regarding refills of specialty medication(s) Tezepelumab-Ekko   Patient requested Delivery   Delivery date: 07/29/23   Verified address: [address] [place]   Medication will be filled on 07/28/23.

## 2023-07-27 ENCOUNTER — Encounter (INDEPENDENT_AMBULATORY_CARE_PROVIDER_SITE_OTHER): Payer: Self-pay | Admitting: Internal Medicine

## 2023-07-28 ENCOUNTER — Other Ambulatory Visit: Payer: Self-pay

## 2023-07-28 ENCOUNTER — Other Ambulatory Visit (INDEPENDENT_AMBULATORY_CARE_PROVIDER_SITE_OTHER): Payer: Self-pay

## 2023-07-28 DIAGNOSIS — E66812 Obesity, class 2: Secondary | ICD-10-CM

## 2023-07-28 MED ORDER — TOPIRAMATE 50 MG PO TABS
50.0000 mg | ORAL_TABLET | Freq: Every day | ORAL | 0 refills | Status: DC
Start: 2023-07-28 — End: 2023-12-23
  Filled 2023-07-28: qty 30, 30d supply, fill #0

## 2023-08-18 ENCOUNTER — Other Ambulatory Visit: Payer: Self-pay

## 2023-08-21 ENCOUNTER — Other Ambulatory Visit: Payer: Self-pay

## 2023-08-24 ENCOUNTER — Other Ambulatory Visit: Payer: Self-pay

## 2023-08-24 ENCOUNTER — Encounter (INDEPENDENT_AMBULATORY_CARE_PROVIDER_SITE_OTHER): Payer: Self-pay

## 2023-08-24 ENCOUNTER — Telehealth (INDEPENDENT_AMBULATORY_CARE_PROVIDER_SITE_OTHER): Payer: Commercial Managed Care - PPO | Admitting: Internal Medicine

## 2023-08-24 NOTE — Progress Notes (Signed)
 Specialty Pharmacy Refill Coordination Note  Tammy Chapman is a 33 y.o. female contacted today regarding refills of specialty medication(s) Tezepelumab -ekko (Tezspire )   Patient requested (Patient-Rptd) Delivery   Delivery date: (Patient-Rptd) 08/26/23   Verified address: (Patient-Rptd) [address] [place], [place] [address]   Medication will be filled on 01.07.25.

## 2023-08-25 ENCOUNTER — Other Ambulatory Visit: Payer: Self-pay

## 2023-09-15 ENCOUNTER — Other Ambulatory Visit (HOSPITAL_COMMUNITY): Payer: Self-pay

## 2023-09-18 ENCOUNTER — Other Ambulatory Visit (HOSPITAL_COMMUNITY): Payer: Self-pay

## 2023-09-18 ENCOUNTER — Other Ambulatory Visit: Payer: Self-pay

## 2023-09-18 NOTE — Progress Notes (Signed)
Specialty Pharmacy Refill Coordination Note  Tammy Chapman is a 33 y.o. female contacted today regarding refills of specialty medication(s) No data recorded  Patient requested (Patient-Rptd) Delivery   Delivery date: (Patient-Rptd) 09/23/23   Verified address: (Patient-Rptd) [address] [place] [address]   Medication will be filled on 09/22/23.

## 2023-09-22 ENCOUNTER — Other Ambulatory Visit: Payer: Self-pay

## 2023-09-23 ENCOUNTER — Other Ambulatory Visit (HOSPITAL_COMMUNITY): Payer: Self-pay

## 2023-09-23 ENCOUNTER — Other Ambulatory Visit: Payer: Self-pay

## 2023-09-24 ENCOUNTER — Other Ambulatory Visit (HOSPITAL_COMMUNITY): Payer: Self-pay

## 2023-09-24 ENCOUNTER — Other Ambulatory Visit: Payer: Self-pay

## 2023-09-28 ENCOUNTER — Other Ambulatory Visit (HOSPITAL_COMMUNITY): Payer: Self-pay

## 2023-09-28 ENCOUNTER — Other Ambulatory Visit: Payer: Self-pay

## 2023-10-14 ENCOUNTER — Other Ambulatory Visit: Payer: Self-pay

## 2023-10-15 ENCOUNTER — Other Ambulatory Visit (HOSPITAL_COMMUNITY): Payer: Self-pay

## 2023-10-15 ENCOUNTER — Ambulatory Visit: Payer: 59 | Admitting: Nurse Practitioner

## 2023-10-15 ENCOUNTER — Encounter: Payer: Self-pay | Admitting: Pharmacist

## 2023-10-15 ENCOUNTER — Other Ambulatory Visit: Payer: Self-pay

## 2023-10-15 ENCOUNTER — Other Ambulatory Visit: Payer: Self-pay | Admitting: Nurse Practitioner

## 2023-10-15 ENCOUNTER — Encounter: Payer: Self-pay | Admitting: Nurse Practitioner

## 2023-10-15 VITALS — BP 124/85 | HR 60 | Temp 98.1°F | Ht 69.0 in | Wt 234.0 lb

## 2023-10-15 DIAGNOSIS — J4551 Severe persistent asthma with (acute) exacerbation: Secondary | ICD-10-CM

## 2023-10-15 DIAGNOSIS — Z9622 Myringotomy tube(s) status: Secondary | ICD-10-CM | POA: Diagnosis not present

## 2023-10-15 DIAGNOSIS — J069 Acute upper respiratory infection, unspecified: Secondary | ICD-10-CM | POA: Insufficient documentation

## 2023-10-15 DIAGNOSIS — J301 Allergic rhinitis due to pollen: Secondary | ICD-10-CM

## 2023-10-15 LAB — POCT INFLUENZA A/B
Influenza A, POC: NEGATIVE
Influenza B, POC: NEGATIVE

## 2023-10-15 LAB — NITRIC OXIDE: Nitric Oxide: 13

## 2023-10-15 LAB — POC COVID19 BINAXNOW: SARS Coronavirus 2 Ag: NEGATIVE

## 2023-10-15 MED ORDER — PREDNISONE 10 MG PO TABS
ORAL_TABLET | ORAL | 0 refills | Status: AC
Start: 2023-10-15 — End: 2023-11-09
  Filled 2023-10-15 – 2023-10-28 (×4): qty 30, 12d supply, fill #0

## 2023-10-15 MED ORDER — AZELASTINE-FLUTICASONE 137-50 MCG/ACT NA SUSP
1.0000 | Freq: Two times a day (BID) | NASAL | 2 refills | Status: DC
Start: 2023-10-15 — End: 2023-10-15
  Filled 2023-10-15: qty 23, 30d supply, fill #0

## 2023-10-15 MED ORDER — AZELASTINE HCL 0.1 % NA SOLN
2.0000 | Freq: Two times a day (BID) | NASAL | 5 refills | Status: DC
Start: 2023-10-15 — End: 2023-12-23
  Filled 2023-10-15: qty 30, 50d supply, fill #0

## 2023-10-15 MED ORDER — BENZONATATE 200 MG PO CAPS
200.0000 mg | ORAL_CAPSULE | Freq: Three times a day (TID) | ORAL | 1 refills | Status: DC | PRN
Start: 2023-10-15 — End: 2023-12-23
  Filled 2023-10-15 – 2023-10-28 (×3): qty 30, 10d supply, fill #0

## 2023-10-15 MED ORDER — PROMETHAZINE-DM 6.25-15 MG/5ML PO SYRP
5.0000 mL | ORAL_SOLUTION | Freq: Four times a day (QID) | ORAL | 0 refills | Status: DC | PRN
Start: 2023-10-15 — End: 2023-12-23
  Filled 2023-10-15: qty 180, 9d supply, fill #0

## 2023-10-15 MED ORDER — FLUTICASONE PROPIONATE 50 MCG/ACT NA SUSP
2.0000 | Freq: Every day | NASAL | 2 refills | Status: DC
  Filled 2023-10-15: qty 16, 50d supply, fill #0

## 2023-10-15 MED ORDER — METHYLPREDNISOLONE ACETATE 80 MG/ML IJ SUSP
80.0000 mg | Freq: Once | INTRAMUSCULAR | Status: AC
Start: 2023-10-15 — End: 2023-10-15
  Administered 2023-10-15: 80 mg via INTRAMUSCULAR

## 2023-10-15 MED ORDER — ALBUTEROL SULFATE (2.5 MG/3ML) 0.083% IN NEBU
2.5000 mg | INHALATION_SOLUTION | Freq: Four times a day (QID) | RESPIRATORY_TRACT | 5 refills | Status: DC | PRN
Start: 2023-10-15 — End: 2024-03-31
  Filled 2023-10-15 – 2023-10-28 (×3): qty 75, 7d supply, fill #0

## 2023-10-15 NOTE — Assessment & Plan Note (Signed)
 URI with bronchitic cough. FeNO is nl today; however, she's having significant nocturnal symptoms, bronchospasm, chest tightness and is at risk for decompensation given severity of her asthma so we will treat her with depo 80 mg inj and prednisone taper. Hold on abx therapy as symptoms seem viral in nature. Continue aggressive maintenance regimen. Action plan in place. Advised on strict return/ED precautions.   Patient Instructions  Continue Airsupra inhaler 2 puffs or albuterol 3 mL neb every 6 hours as needed for shortness of breath or wheezing. Notify if symptoms persist despite rescue inhaler/neb use.  Continue Symbicort 2 puffs Twice daily. Brush tongue and rinse mouth afterwards Continue Spiriva 2 puffs daily  Restart dymista nasal spray 2 sprays each nostril daily Continue Xyzal 1 tab daily Continue singulair 1 tab At bedtime  Continue Tezspire every 28 days   -Use saline rinse 1-2 times a day with bottled distilled water and follow 20-30 minutes later with flonase  -Prednisone taper. 4 tabs for 2 days, then 3 tabs for 2 days, 2 tabs for 2 days, then 1 tab for 2 days, then stop. Take in AM with food. Start tomorrow -Promethazine DM cough syrup 5 mL every 6 hours as needed for cough. Do not drive after taking. May cause grogginess -Benzonatate 1 capsule Three times a day for cough   Steroid shot today   Call ENT about your right ear. It doesn't currently look infected but the tube looks like it's not in the right position or possibly just impacted with ear wax. If you start having discomfort or drainage, let me know and I'll start antibiotics if it's before you can see them    Follow up in 4 months with Dr. [name] or Philis Nettle. If symptoms do not improve or worsen, please contact office for sooner follow up or seek emergency care.

## 2023-10-15 NOTE — Patient Instructions (Addendum)
 Continue Airsupra inhaler 2 puffs or albuterol 3 mL neb every 6 hours as needed for shortness of breath or wheezing. Notify if symptoms persist despite rescue inhaler/neb use.  Continue Symbicort 2 puffs Twice daily. Brush tongue and rinse mouth afterwards Continue Spiriva 2 puffs daily  Restart dymista nasal spray 2 sprays each nostril daily Continue Xyzal 1 tab daily Continue singulair 1 tab At bedtime  Continue Tezspire every 28 days   -Use saline rinse 1-2 times a day with bottled distilled water and follow 20-30 minutes later with flonase  -Prednisone taper. 4 tabs for 2 days, then 3 tabs for 2 days, 2 tabs for 2 days, then 1 tab for 2 days, then stop. Take in AM with food. Start tomorrow -Promethazine DM cough syrup 5 mL every 6 hours as needed for cough. Do not drive after taking. May cause grogginess -Benzonatate 1 capsule Three times a day for cough   Steroid shot today   Call ENT about your right ear. It doesn't currently look infected but the tube looks like it's not in the right position or possibly just impacted with ear wax. If you start having discomfort or drainage, let me know and I'll start antibiotics if it's before you can see them    Follow up in 4 months with Dr. [name] or Philis Nettle. If symptoms do not improve or worsen, please contact office for sooner follow up or seek emergency care.

## 2023-10-15 NOTE — Assessment & Plan Note (Signed)
 COVID/flu testing negative. Supportive care. Restart saline rinses and intranasal sprays. See above

## 2023-10-15 NOTE — Progress Notes (Signed)
 Specialty Pharmacy Refill Coordination Note  Tammy Chapman is a 33 y.o. female contacted today regarding refills of specialty medication(s) Tammy Chapman Tammy Chapman)   Patient requested Delivery   Delivery date: 10/20/23   Verified address: [address] [place] [address]   Medication will be filled on 10/19/23.
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
## 2023-10-16 ENCOUNTER — Other Ambulatory Visit: Payer: Self-pay

## 2023-10-16 ENCOUNTER — Encounter: Payer: Self-pay | Admitting: Nurse Practitioner

## 2023-10-16 ENCOUNTER — Other Ambulatory Visit: Payer: Self-pay | Admitting: Emergency Medicine

## 2023-10-16 DIAGNOSIS — J4551 Severe persistent asthma with (acute) exacerbation: Secondary | ICD-10-CM

## 2023-10-16 DIAGNOSIS — Z9622 Myringotomy tube(s) status: Secondary | ICD-10-CM | POA: Insufficient documentation

## 2023-10-16 NOTE — Assessment & Plan Note (Signed)
 No obvious s/s of infection. Her right one appears to be protruding or possibly displaced. She does also have some cerumen, which could be impacting the tube. Advised her to call ENT for further evaluation and notify if any infectious symptoms develop in the interim (ear pain, exudate, fevers).

## 2023-10-17 ENCOUNTER — Other Ambulatory Visit (HOSPITAL_COMMUNITY): Payer: Self-pay

## 2023-10-19 ENCOUNTER — Other Ambulatory Visit (HOSPITAL_COMMUNITY): Payer: Self-pay

## 2023-10-19 ENCOUNTER — Other Ambulatory Visit: Payer: Self-pay

## 2023-10-19 ENCOUNTER — Other Ambulatory Visit: Payer: Self-pay | Admitting: Pharmacist

## 2023-10-19 DIAGNOSIS — J4551 Severe persistent asthma with (acute) exacerbation: Secondary | ICD-10-CM

## 2023-10-19 MED ORDER — TEZSPIRE 210 MG/1.91ML ~~LOC~~ SOAJ
210.0000 mg | SUBCUTANEOUS | 3 refills | Status: DC
Start: 2023-10-19 — End: 2023-10-19
  Filled 2023-10-19: qty 1.91, 28d supply, fill #0

## 2023-10-19 MED ORDER — TEZSPIRE 210 MG/1.91ML ~~LOC~~ SOAJ
210.0000 mg | SUBCUTANEOUS | 3 refills | Status: DC
Start: 2023-10-19 — End: 2024-02-24
  Filled 2023-10-19: qty 1.91, 28d supply, fill #0
  Filled 2023-11-16 (×2): qty 1.91, 28d supply, fill #1
  Filled 2023-12-09: qty 1.91, 28d supply, fill #2
  Filled 2024-01-12: qty 1.91, 28d supply, fill #3

## 2023-10-20 ENCOUNTER — Other Ambulatory Visit: Payer: Self-pay

## 2023-10-21 ENCOUNTER — Other Ambulatory Visit: Payer: Self-pay

## 2023-10-22 ENCOUNTER — Encounter (INDEPENDENT_AMBULATORY_CARE_PROVIDER_SITE_OTHER): Payer: Self-pay

## 2023-10-23 ENCOUNTER — Other Ambulatory Visit: Payer: Self-pay

## 2023-10-23 ENCOUNTER — Other Ambulatory Visit (HOSPITAL_COMMUNITY): Payer: Self-pay

## 2023-10-28 ENCOUNTER — Other Ambulatory Visit (HOSPITAL_COMMUNITY): Payer: Self-pay

## 2023-10-28 ENCOUNTER — Other Ambulatory Visit (HOSPITAL_BASED_OUTPATIENT_CLINIC_OR_DEPARTMENT_OTHER): Payer: Self-pay

## 2023-10-28 ENCOUNTER — Other Ambulatory Visit: Payer: Self-pay

## 2023-11-11 ENCOUNTER — Other Ambulatory Visit (HOSPITAL_COMMUNITY): Payer: Self-pay

## 2023-11-13 ENCOUNTER — Other Ambulatory Visit (HOSPITAL_COMMUNITY): Payer: Self-pay

## 2023-11-13 ENCOUNTER — Other Ambulatory Visit: Payer: Self-pay

## 2023-11-16 ENCOUNTER — Other Ambulatory Visit: Payer: Self-pay

## 2023-11-16 ENCOUNTER — Other Ambulatory Visit (HOSPITAL_COMMUNITY): Payer: Self-pay

## 2023-11-16 NOTE — Progress Notes (Signed)
 Specialty Pharmacy Refill Coordination Note  Tammy Chapman is a 33 y.o. female contacted today regarding refills of specialty medication(s) Daiva Huge Dorothea Ogle)   Patient requested Delivery   Delivery date: 11/18/23   Verified address: [address]   [place] [address]   Medication will be filled on 11/17/23.

## 2023-11-17 ENCOUNTER — Other Ambulatory Visit: Payer: Self-pay

## 2023-11-25 DIAGNOSIS — H6993 Unspecified Eustachian tube disorder, bilateral: Secondary | ICD-10-CM | POA: Diagnosis not present

## 2023-11-25 DIAGNOSIS — Z9622 Myringotomy tube(s) status: Secondary | ICD-10-CM | POA: Diagnosis not present

## 2023-12-08 ENCOUNTER — Other Ambulatory Visit: Payer: Self-pay

## 2023-12-08 ENCOUNTER — Other Ambulatory Visit (HOSPITAL_COMMUNITY): Payer: Self-pay

## 2023-12-09 ENCOUNTER — Other Ambulatory Visit: Payer: Self-pay

## 2023-12-09 NOTE — Progress Notes (Signed)
 Specialty Pharmacy Refill Coordination Note  Tammy Chapman is a 33 y.o. female contacted today regarding refills of specialty medication(s) Tezepelumab -ekko (Tezspire )   Patient requested (Patient-Rptd) Delivery   Delivery date: (Patient-Rptd) 12/16/23   Verified address: (Patient-Rptd) [address] [place] [address]   Medication will be filled on 12/15/23.

## 2023-12-15 ENCOUNTER — Other Ambulatory Visit: Payer: Self-pay

## 2023-12-23 ENCOUNTER — Ambulatory Visit: Admitting: Women's Health

## 2023-12-23 ENCOUNTER — Other Ambulatory Visit (HOSPITAL_COMMUNITY): Payer: Self-pay

## 2023-12-23 ENCOUNTER — Other Ambulatory Visit (HOSPITAL_COMMUNITY)
Admission: RE | Admit: 2023-12-23 | Discharge: 2023-12-23 | Disposition: A | Discharge status: Home / Self Care | Source: Ambulatory Visit | Attending: Women's Health | Admitting: Women's Health

## 2023-12-23 ENCOUNTER — Other Ambulatory Visit: Payer: Self-pay

## 2023-12-23 ENCOUNTER — Encounter: Payer: Self-pay | Admitting: Women's Health

## 2023-12-23 VITALS — BP 132/87 | HR 90 | Ht 70.0 in | Wt 247.4 lb

## 2023-12-23 DIAGNOSIS — Z01419 Encounter for gynecological examination (general) (routine) without abnormal findings: Secondary | ICD-10-CM

## 2023-12-23 DIAGNOSIS — E282 Polycystic ovarian syndrome: Secondary | ICD-10-CM

## 2023-12-23 DIAGNOSIS — N644 Mastodynia: Secondary | ICD-10-CM

## 2023-12-23 DIAGNOSIS — Z6835 Body mass index (BMI) 35.0-35.9, adult: Secondary | ICD-10-CM | POA: Diagnosis not present

## 2023-12-23 MED ORDER — MEDROXYPROGESTERONE ACETATE 10 MG PO TABS
10.0000 mg | ORAL_TABLET | Freq: Every day | ORAL | 11 refills | Status: DC
  Filled 2023-12-23: qty 10, 10d supply, fill #0

## 2023-12-23 NOTE — Progress Notes (Signed)
 WELL-WOMAN EXAMINATION Patient name: Tammy Chapman MRN [id number]  Date of birth: February 25, 1991 Chief Complaint:   No chief complaint on file.  History of Present Illness:   Tammy Chapman is a 33 y.o. G20P2002 Caucasian female being seen today for a routine well-woman exam.  Current complaints: PCOS, periods q few months, last 3/29. Doesn't feel good not having periods. May want to try to conceive soon, took clomid  w/ 1st, lost a lot of weight and didn't have to do clomid  w/ 2nd.  Wants to lose weight, eating well and very active but nothing is happening. Previously rx'd metformin  but made her hypoglycemic, didn't notice anything w/ topamax . Hasn't been to dietician.  Bruise on Rt breast recently and since has had constant pain.   PCP: Rochelle Chu, wants PCP list      does not desire labs Patient's last menstrual period was 11/14/2023. The current method of family planning is none.  Last pap 09/15/19. Results were: NILM w/ HRHPV negative. H/O abnormal pap: no Last mammogram: never. Results were: N/A. Family h/o breast cancer: no Last colonoscopy: previously to r/o Crohn's, was normal  Family h/o colorectal cancer: yes very distant     12/23/2023    8:36 AM 02/27/2021    9:57 AM 12/29/2018    2:36 PM 11/24/2018    9:40 AM 06/04/2016   11:36 AM  Depression screen PHQ 2/9  Decreased Interest 0 0 0 0 0  Down, Depressed, Hopeless 0 0 0 0 0  PHQ - 2 Score 0 0 0 0 0  Altered sleeping 0  0  0  Tired, decreased energy 0  0  0  Change in appetite 0  0  0  Feeling bad or failure about yourself  0  0  0  Trouble concentrating 0  0  0  Moving slowly or fidgety/restless 0  0  0  Suicidal thoughts 0  0  0  PHQ-9 Score 0  0  0        12/23/2023    8:36 AM  GAD 7 : Generalized Anxiety Score  Nervous, Anxious, on Edge 0  Control/stop worrying 0  Worry too much - different things 0  Trouble relaxing 0  Restless 0  Easily annoyed or irritable 0  Afraid - awful might happen 0  Total GAD 7 Score 0      Review of Systems:   Pertinent items are noted in HPI Denies any headaches, blurred vision, fatigue, shortness of breath, chest pain, abdominal pain, abnormal vaginal discharge/itching/odor/irritation, problems with periods, bowel movements, urination, or intercourse unless otherwise stated above. Pertinent History Reviewed:  Reviewed past medical,surgical, social and family history.  Reviewed problem list, medications and allergies. Physical Assessment:   Vitals:   12/23/23 0831  BP: 132/87  Pulse: 90  Weight: 247 lb 6.4 oz (112.2 kg)  Height: 5\' 10"  (1.778 m)  Body mass index is 35.5 kg/m.        Physical Examination:   General appearance - well appearing, and in no distress  Mental status - alert, oriented to person, place, and time  Psych:  She has a normal mood and affect  Skin - warm and dry, normal color, no suspicious lesions noted  Chest - effort normal, all lung fields clear to auscultation bilaterally  Heart - normal rate and regular rhythm  Neck:  midline trachea, no thyromegaly or nodules  Breasts - breasts appear normal, no suspicious masses, no skin or nipple changes or  axillary nodes;  slight tenderness Rt upper outer quadrant ~10 o'clock  Abdomen - soft, nontender, nondistended, no masses or organomegaly  Pelvic - VULVA: normal appearing vulva with no masses, tenderness or lesions  VAGINA: normal appearing vagina with normal color and discharge, no lesions  CERVIX: normal appearing cervix without discharge or lesions, no CMT  Thin prep pap is done w/ HR HPV cotesting  UTERUS: uterus is felt to be normal size, shape, consistency and nontender   ADNEXA: No adnexal masses or tenderness noted.  Extremities:  No swelling or varicosities noted  Chaperone: Lorean Rodes  No results found for this or any previous visit (from the past 24 hours).  Assessment & Plan:  1) Well-Woman Exam  2) PCOS w/ irregular periods> may want to conceive soon, for now does not  want birth control, wants provera  monthly, rx sent, let me know if no bleeding after taking, f/u   3) BMI 35, hard time losing weight> eating well and very active, referral to dietician, PCOS diet info given  4) Rt breast pain> diagnostic mammo and u/s orders placed and note routed to [facility] to schedule  Labs/procedures today: pap  Mammogram: diagnostic mammo and breast u/s ordered Colonoscopy: @ 32yo, or sooner if problems  Orders Placed This Encounter  Procedures   MM 3D DIAGNOSTIC MAMMOGRAM BILATERAL BREAST   US  LIMITED ULTRASOUND INCLUDING AXILLA LEFT BREAST    US  LIMITED ULTRASOUND INCLUDING AXILLA RIGHT BREAST   Amb ref to Medical Nutrition Therapy-MNT    Meds:  Meds ordered this encounter  Medications   medroxyPROGESTERone  (PROVERA ) 10 MG tablet    Sig: Take 1 tablet (10 mg total) by mouth daily. Every month if no spontaneous period    Dispense:  10 tablet    Refill:  11    Follow-up: Return in about 3 months (around 03/24/2024) for med f/u, in person.  Ferd Householder CNM, [facility] 12/23/2023 9:04 AM

## 2023-12-23 NOTE — Patient Instructions (Signed)
 Primary Care Providers Dr. Izetta [place]) [phone] [facility] [phone] [facility] [place]) [phone] [facility] [place]) [phone] [facility] [place]) [phone] [facility] [place]) [phone] [facility] [phone] [facility] [phone]   Diet for Polycystic Ovary Syndrome Polycystic ovary syndrome (PCOS) is a common hormonal disorder that affects a woman's reproductive system. It can cause problems with menstrual periods and make it hard to get and stay pregnant. Changing what you eat can help your hormones reach normal levels, improve your health, and help you better manage PCOS. Following a balanced diet can help you lose weight and improve the way that your body uses the hormone insulin  to control blood sugar. This may include: Eating low-fat (lean) proteins, complex carbohydrates, fresh fruits and vegetables, low-fat dairy products, healthy fats, and fiber. Cutting down on calories. Exercising regularly. What are tips for following this plan? Follow a balanced diet for meals and snacks. Eat breakfast, lunch, dinner, and one or two snacks every day. Include protein in each meal and snack. Choose whole grains instead of products that are made with refined flour. Eat a variety of foods. Exercise regularly as told by your health care provider. Aim to do at least 30 minutes of exercise on most days of the week. If you are overweight or obese: Pay attention to how many calories you eat. Cutting down on calories can help you lose weight. Work with your health care provider or a dietitian to figure out how many calories you need each day. What foods should I eat?  Fruits Include a variety of colors and types. All fruits are helpful for PCOS. Vegetables Include a variety of colors and types. All vegetables are helpful for PCOS. Grains Whole grains, such as whole wheat.  Whole-grain breads, crackers, cereals, and pasta. Unsweetened oatmeal. Bulgur, barley, quinoa, and brown rice. Tortillas made from corn or whole-wheat flour. Meats and other proteins Lean proteins, such as fish, chicken, beans, eggs, and tofu. Dairy Low-fat dairy products, such as skim milk, cheese sticks, and yogurt. Beverages Low-fat or fat-free drinks, such as water, low-fat milk, sugar-free drinks, and small amounts of 100% fruit juice. Seasonings and condiments Ketchup. Mustard. Barbecue sauce. Relish. Low-fat or fat-free mayonnaise. Fats and oils Olive oil or canola oil. Walnuts and almonds. The items listed above may not be a complete list of recommended foods and beverages. Contact a dietitian for more options. What foods should I avoid? Foods that are high in calories or fat, especially saturated or trans fats. Fried foods. Sweets. Products that are made from refined white flour, including white bread, pastries, white rice, and pasta. The items listed above may not be a complete list of foods and beverages to avoid. Contact a dietitian for more information. Summary PCOS is a hormonal imbalance that affects a woman's reproductive system. It can cause problems with menstrual periods and make it hard to get and stay pregnant. You can help to manage your PCOS by exercising regularly and eating a healthy, varied diet of vegetables, fruit, whole grains, lean protein, and low-fat dairy products. Changing what you eat can improve the way that your body uses insulin , help your hormones reach normal levels, and help you lose weight. This information is not intended to replace advice given to you by your health care provider. Make sure you discuss any questions you have with your health care provider. Document Revised: 06/22/2023 Document Reviewed: 06/22/2023 Elsevier Patient Education  2024 [employer].

## 2023-12-28 LAB — CYTOLOGY - PAP
Comment: NEGATIVE
Diagnosis: UNDETERMINED — AB
High risk HPV: NEGATIVE

## 2023-12-29 ENCOUNTER — Ambulatory Visit: Payer: Self-pay | Admitting: Women's Health

## 2023-12-29 DIAGNOSIS — R87619 Unspecified abnormal cytological findings in specimens from cervix uteri: Secondary | ICD-10-CM | POA: Insufficient documentation

## 2024-01-05 ENCOUNTER — Other Ambulatory Visit: Payer: Self-pay

## 2024-01-12 ENCOUNTER — Ambulatory Visit (HOSPITAL_COMMUNITY)
Admission: RE | Admit: 2024-01-12 | Discharge: 2024-01-12 | Disposition: A | Discharge status: Home / Self Care | Source: Ambulatory Visit | Attending: Women's Health | Admitting: Women's Health

## 2024-01-12 ENCOUNTER — Other Ambulatory Visit: Payer: Self-pay

## 2024-01-12 ENCOUNTER — Ambulatory Visit (HOSPITAL_COMMUNITY)
Admission: RE | Admit: 2024-01-12 | Discharge: 2024-01-12 | Discharge status: Home / Self Care | Source: Ambulatory Visit | Attending: Women's Health | Admitting: Women's Health

## 2024-01-12 ENCOUNTER — Encounter (HOSPITAL_COMMUNITY): Payer: Self-pay

## 2024-01-12 DIAGNOSIS — N644 Mastodynia: Secondary | ICD-10-CM | POA: Diagnosis not present

## 2024-01-12 DIAGNOSIS — R92333 Mammographic heterogeneous density, bilateral breasts: Secondary | ICD-10-CM | POA: Diagnosis not present

## 2024-01-12 DIAGNOSIS — S2001XA Contusion of right breast, initial encounter: Secondary | ICD-10-CM | POA: Diagnosis not present

## 2024-01-12 DIAGNOSIS — M79622 Pain in left upper arm: Secondary | ICD-10-CM | POA: Diagnosis not present

## 2024-01-12 NOTE — Progress Notes (Signed)
 Specialty Pharmacy Refill Coordination Note  Tammy Chapman is a 33 y.o. female contacted today regarding refills of specialty medication(s) Tezepelumab -ekko (Tezspire )   Patient requested Delivery   Delivery date: 01/14/24   Verified address: [address] [place] [address]   Medication will be filled on 01/13/24.

## 2024-01-12 NOTE — Progress Notes (Signed)
 Specialty Pharmacy Ongoing Clinical Assessment Note  Tammy Chapman is a 33 y.o. female who is being followed by the specialty pharmacy service for RxSp Asthma/COPD   Patient's specialty medication(s) reviewed today: Tezepelumab -ekko (Tezspire )   Missed doses in the last 4 weeks: 0   Patient/Caregiver did not have any additional questions or concerns.   Therapeutic benefit summary: Patient is achieving benefit   Adverse events/side effects summary: No adverse events/side effects   Patient's therapy is appropriate to: Continue    Goals Addressed             This Visit's Progress    Reduce signs and symptoms   On track    Patient is on track. Patient will maintain adherence and avoid flare triggers.          Follow up: 6 months  [facility]

## 2024-01-13 ENCOUNTER — Other Ambulatory Visit: Payer: Self-pay

## 2024-01-13 ENCOUNTER — Ambulatory Visit: Admitting: Nutrition

## 2024-01-15 ENCOUNTER — Ambulatory Visit: Attending: Internal Medicine | Admitting: Pharmacist

## 2024-01-15 DIAGNOSIS — Z7189 Other specified counseling: Secondary | ICD-10-CM

## 2024-01-15 NOTE — Progress Notes (Signed)
 HPI Patient presents today to [facility] clinic to see pharmacy team for Tezspire  review. She takes this for moderate persistent asthma. Pt is managed by Dr. Baldwin Levee for this.  Adherence: confirms   Efficacy: pt endorses good results with the medication   [facility] Medications: Current regimen: Tezspire  210 mg q28days Patient reports no known adherence challenges.  OBJECTIVE Allergies  Allergen Reactions   Benadryl  [Diphenhydramine  Hcl] Shortness Of Breath    Pt states that she feels fine if she sits down, but if she is moving around after taking benadryl , she feels SOB   Oxycodone -Acetaminophen  Hives   Penicillins Shortness Of Breath    Has patient had a PCN reaction causing immediate rash, facial/tongue/throat swelling, SOB or lightheadedness with hypotension: yes Has patient had a PCN reaction causing severe rash involving mucus membranes or skin necrosis: no Has patient had a PCN reaction that required hospitalization: no Has patient had a PCN reaction occurring within the last 10 years: no If all of the above answers are "NO", then may proceed with Cephalosporin use. Patient reports she CAN TAKE kEFLEX  AND SUPRAX    Adhesive [Tape] Hives   Lactose Intolerance (Gi) Diarrhea and Nausea And Vomiting   Latex Rash   Omnipaque  [Iohexol ] Itching    Slight itching in mouth and tongue. No swelling, wheezing, or hives.   Sulfa Antibiotics Rash   Ultram [Tramadol Hcl] Rash    Outpatient Encounter Medications as of 01/15/2024  Medication Sig   albuterol  (PROVENTIL ) (2.5 MG/3ML) 0.083% nebulizer solution Inhale 1 vial (2.5 mg total) by nebulization every 6 (six) hours as needed for wheezing or shortness of breath.   Albuterol -Budesonide  (AIRSUPRA ) 90-80 MCG/ACT AERO Inhale 2 puffs into the lungs 4 (four) times daily as needed.   aspirin EC 81 MG tablet Take 81 mg by mouth daily. Swallow whole.   budesonide -formoterol  (SYMBICORT ) 160-4.5 MCG/ACT inhaler Inhale 2 puffs into the lungs 2 (two) times  daily.   levocetirizine (XYZAL ) 5 MG tablet TAKE 1 TABLET BY MOUTH EVERY EVENING   medroxyPROGESTERone  (PROVERA ) 10 MG tablet Take 1 tablet (10 mg total) by mouth daily. Every month if no spontaneous period   montelukast  (SINGULAIR ) 10 MG tablet TAKE 1 TABLET BY MOUTH AT BEDTIME   Tezepelumab -ekko (TEZSPIRE ) 210 MG/1. SOAJ Inject 210 mg into the skin every 28 (twenty-eight) days.   Tiotropium Bromide  Monohydrate (SPIRIVA  RESPIMAT) 1.25 MCG/ACT AERS Inhale 2 puffs into the lungs daily.   No facility-administered encounter medications on file as of 01/15/2024.     Immunization History  Administered Date(s) Administered   Hepatitis B 01/02/2012   Influenza,inj,Quad PF,6+ Mos 05/07/2019   Influenza-Unspecified 05/07/2019, 05/21/2020, 05/08/2021   PNEUMOCOCCAL CONJUGATE-20 02/27/2021   Pneumococcal Polysaccharide-23 07/03/2019   Tdap 04/13/2019     PFTs    Latest Ref Rng & Units 01/25/2021    3:46 PM 01/20/2018    9:06 AM  PFT Results  FVC-Pre L 4.75  4.68   FVC-Predicted Pre % 104  102   FVC-Post L 4.91  4.74   FVC-Predicted Post % 107  104   Pre FEV1/FVC % % 85  84   Post FEV1/FCV % % 86  86   FEV1-Pre L 4.05  3.94   FEV1-Predicted Pre % 106  102   FEV1-Post L 4.23  4.06   DLCO uncorrected ml/min/mmHg 29.18    DLCO UNC% % 107    DLCO corrected ml/min/mmHg 29.18    DLCO COR %Predicted % 107    DLVA Predicted % 110  TLC L 6.90    TLC % Predicted % 115    RV % Predicted % 130       Assessment   Biologics review for tezepulumab (Tezspire )  Goals of therapy: Mechanism: 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 This blocks TSLP from its effect on inflammation including reduce eosinophils, IgE, FeNO, IL-5, and IL-13. Mechanism is not definitively established. Reviewed that Tezspire  is add-on medication and patient must continue maintenance inhaler regimen. Response to therapy: may take 3-4 months to determine efficacy.  Side effects: injection site reaction  (6-18%), antibody development (2%), arthralgia (4%), back pain (4%), pharyngitis (4%)  Dose: Tezspire  210 mg once every 4 weeks  Administration/Storage:  Reviewed administration sites of thigh or abdomen (at least 2-3 inches away from abdomen). Reviewed the upper arm is only appropriate if caregiver is administering injection  Do not shake pen/syringe as this could lead to product foaming or precipitation. Do not shake syringe as this could lead to product foaming or precipitation.  Access: Approval of Tezspire  through: insurance Patient enrolled into copay card program to help with copay assistance.   Medication Reconciliation  A drug regimen assessment was performed, including review of allergies, interactions, disease-state management, dosing and immunization history. Medications were reviewed with the patient, including name, instructions, indication, goals of therapy, potential side effects, importance of adherence, and safe use.  Drug interaction(s): none identified.   PLAN Continue Tezspire  210mg  SQ every 28 days.   All questions encouraged and answered.  Instructed patient to reach out with any further questions or concerns.  Thank you for allowing pharmacy to participate in this patient's care.  This appointment required 15 minutes of patient care (this includes precharting, chart review, review of results, face-to-face care, etc.).  Marene Shape, PharmD, Becky Bowels, CPP Clinical Pharmacist [facility] [phone]

## 2024-01-19 ENCOUNTER — Ambulatory Visit (HOSPITAL_COMMUNITY)

## 2024-01-19 ENCOUNTER — Inpatient Hospital Stay (HOSPITAL_COMMUNITY): Admission: RE | Admit: 2024-01-19 | Source: Ambulatory Visit

## 2024-01-20 ENCOUNTER — Encounter: Attending: Internal Medicine | Admitting: Nutrition

## 2024-01-20 ENCOUNTER — Encounter: Payer: Self-pay | Admitting: Emergency Medicine

## 2024-01-20 ENCOUNTER — Encounter: Payer: Self-pay | Admitting: Nutrition

## 2024-01-20 ENCOUNTER — Ambulatory Visit
Admission: EM | Admit: 2024-01-20 | Discharge: 2024-01-20 | Disposition: A | Discharge status: Home / Self Care | Attending: Physician Assistant | Admitting: Physician Assistant

## 2024-01-20 VITALS — Ht 70.0 in | Wt 243.0 lb

## 2024-01-20 DIAGNOSIS — R051 Acute cough: Secondary | ICD-10-CM | POA: Diagnosis not present

## 2024-01-20 DIAGNOSIS — E282 Polycystic ovarian syndrome: Secondary | ICD-10-CM | POA: Insufficient documentation

## 2024-01-20 DIAGNOSIS — U071 COVID-19: Secondary | ICD-10-CM | POA: Diagnosis not present

## 2024-01-20 DIAGNOSIS — J4541 Moderate persistent asthma with (acute) exacerbation: Secondary | ICD-10-CM | POA: Diagnosis not present

## 2024-01-20 DIAGNOSIS — J4542 Moderate persistent asthma with status asthmaticus: Secondary | ICD-10-CM | POA: Diagnosis not present

## 2024-01-20 LAB — POC COVID19/FLU A&B COMBO
Covid Antigen, POC: POSITIVE — AB
Influenza A Antigen, POC: NEGATIVE
Influenza B Antigen, POC: NEGATIVE

## 2024-01-20 LAB — POCT RAPID STREP A (OFFICE): Rapid Strep A Screen: NEGATIVE

## 2024-01-20 MED ORDER — HYDROCOD POLI-CHLORPHE POLI ER 10-8 MG/5ML PO SUER: 5.0000 mL | Freq: Two times a day (BID) | ORAL | 0 refills | Status: DC | PRN

## 2024-01-20 MED ORDER — PREDNISONE 10 MG (21) PO TBPK: ORAL_TABLET | ORAL | 0 refills | Status: DC

## 2024-01-20 MED ORDER — METHYLPREDNISOLONE SODIUM SUCC 125 MG IJ SOLR
60.0000 mg | Freq: Once | INTRAMUSCULAR | Status: AC
  Administered 2024-01-20: 60 mg via INTRAMUSCULAR

## 2024-01-20 MED ORDER — AIRSUPRA 90-80 MCG/ACT IN AERO: 2.0000 | INHALATION_SPRAY | Freq: Four times a day (QID) | RESPIRATORY_TRACT | 1 refills | Status: DC | PRN

## 2024-01-20 NOTE — Discharge Instructions (Signed)
 We are treating you for an asthma exacerbation.  Continue the Airsupra  every 4 hours as needed.  Rinse your mouth following use of this medication.  We gave an injection of steroids today so please start prednisone  tomorrow (01/21/2024).  Do not take NSAIDs with this medication including aspirin, ibuprofen /Advil , naproxen/Aleve.  I have called in Tussionex to help with your cough.  This will make you sleepy so do not drive or drink alcohol while taking it.  This medication contains an opioid which can be addictive and sedating so please try to limit use as much as possible.  We decided not to do Paxlovid  since that cannot be combined with Tussionex.  Monitor your oxygen saturation at home and if this is below 90% you need to go to the ER.  Follow-up with your pulmonologist as soon as possible.  If anything worsens please go to the emergency room immediately as we discussed.

## 2024-01-20 NOTE — Patient Instructions (Signed)
 Goals  Eat three meals per day Eat oatmeal, cottage cheese and fruit with breakfast Increase vegetables to 50% of meals Increase plant proteins of whole grains, dried beans, pease Walk 2 miles per day

## 2024-01-20 NOTE — ED Triage Notes (Signed)
 Sore throat since Monday.  Cough, nasal congestion, headache and fever.  States lymph nodes in neck and underarms are swollen

## 2024-01-20 NOTE — Progress Notes (Signed)
 Medical Nutrition Therapy  Appointment Start time:  1400  Appointment End time:  1500  Primary concerns today: PCOS, Overweight Referral diagnosis: E66.9, E28.2  Preferred learning style: Ready  Learning readiness: Readfy    NUTRITION ASSESSMENT  33 yr old wfemale referred for obesity and PCOS. Had been to the healthy weight and management center and didn't find it helpful. Her kids have various allergies. Has limited income and has to prepare foods on budget . Works from home. Walks with family some. Is on birth control to help regulate her monthly cycles. Says she has IBS also.  Typically eats 2 meals per day. 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 Low VIT D levels. She is willing to work with lifesyle medicine to improve her heatlh, lose weight and help regulate her monthly cycles.  Current diet of eating 2 meals per day is not meeting her nutritional needs and is effecting her goals of weight loss and improved health. 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/2019   Chronic asthma 01/19/2019   Ehlers-Danlos disease    Gallbladder problem    IBS (irritable bowel syndrome)    Joint pain    Lactose intolerance    Loose body of right knee 02/2013   PCOS (polycystic ovarian syndrome)    Pregnancy induced hypertension    Recurrent otitis media of both ears 01/19/2019   SOBOE (shortness of breath on exertion)    Sprain and strain of medial collateral ligament of knee 02/2013   right    Medications: Current Outpatient Medications on File Prior to Visit  Medication Sig Dispense Refill   albuterol  (PROVENTIL ) (2.5 MG/3ML) 0.083% nebulizer solution Inhale 1 vial (2.5 mg total) by nebulization every 6 (six) hours as needed for wheezing or shortness of breath. 75 mL 5   Albuterol -Budesonide  (AIRSUPRA ) 90-80 MCG/ACT AERO Inhale 2 puffs into the lungs 4 (four) times daily as needed.  32.1 g 1   aspirin EC 81 MG tablet Take 81 mg by mouth daily. Swallow whole.     budesonide -formoterol  (SYMBICORT ) 160-4.5 MCG/ACT inhaler Inhale 2 puffs into the lungs 2 (two) times daily. 10.2 g 11   levocetirizine (XYZAL ) 5 MG tablet TAKE 1 TABLET BY MOUTH EVERY EVENING 30 tablet 10   medroxyPROGESTERone  (PROVERA ) 10 MG tablet Take 1 tablet (10 mg total) by mouth daily. Every month if no spontaneous period 10 tablet 11   montelukast  (SINGULAIR ) 10 MG tablet TAKE 1 TABLET BY MOUTH AT BEDTIME 30 tablet 10   Tezepelumab -ekko (TEZSPIRE ) 210 MG/1. SOAJ Inject 210 mg into the skin every 28 (twenty-eight) days. 1.91 mL 3   Tiotropium Bromide  Monohydrate (SPIRIVA  RESPIMAT) 1.25 MCG/ACT AERS Inhale 2 puffs into the lungs daily. 4 g 6   No current facility-administered medications on file prior to visit.    Labs:  Lab Results  Component Value Date   HGBA1C 5.5 03/04/2023    Notable Signs/Symptoms:   Lifestyle & Dietary Hx Lives with husband and 2 kids. Work FT from home. Cooks meals at home  Estimated daily fluid intake: 128  oz Supplements:  Sleep: 8/9 Stress / self-care: nothing outside of work and family Current average weekly physical activity: Walk most days 2-3 miles  24-Hr Dietary Recall First Meal: water Snack:  Second Meal: homemade chicken soup- chicken, broth and rice, water Snack:  Third Meal: Same as  lunch  Snack:  Beverages: water  Estimated Energy Needs Calories: 1200-1400 Carbohydrate: 135g Protein: 90g Fat: 33g   NUTRITION DIAGNOSIS  Food and nutrition-related knowledge deficit related to recent medical diagnosis of PCOS as evidenced by new diagnosis of PCOS and limited previous food and nutrition education.   NUTRITION INTERVENTION  Nutrition education (E-1) on the following topics:  Lifestyle Medicine  - Whole Food, Plant Predominant Nutrition is highly recommended: Eat Plenty of vegetables, Mushrooms, fruits, Legumes, Whole Grains, Nuts, seeds  in lieu of processed meats, processed snacks/pastries red meat, poultry, eggs.    -It is better to avoid simple carbohydrates including: Cakes, Sweet Desserts, Ice Cream, Soda (diet and regular), Sweet Tea, Candies, Chips, Cookies, Store Bought Juices, Alcohol in Excess of  1-2 drinks a day, Lemonade,  Artificial Sweeteners, Doughnuts, Coffee Creamers, "Sugar-free" Products, etc, etc.  This is not a complete list.....  Exercise: If you are able: 30 -60 minutes a day ,4 days a week, or 150 minutes a week.  The longer the better.  Combine stretch, strength, and aerobic activities.  If you were told in the past that you have high risk for cardiovascular diseases, you may seek evaluation by your heart doctor prior to initiating moderate to intense exercise programs.  Handouts Provided Include  Lifestyle Medicine  Learning Style & Readiness for Change Teaching method utilized: Visual & Auditory  Demonstrated degree of understanding via: Teach Back  Barriers to learning/adherence to lifestyle change: LImited to finances.  Goals Established by Pt Goals  Eat three meals per day Eat oatmeal, cottage cheese and fruit with breakfast Increase vegetables to 50% of meals Increase plant proteins of whole grains, dried beans, pease Walk 2 miles per day   MONITORING & EVALUATION Dietary intake, weekly physical activity, and weight in 1 month.  Next Steps  Patient is to work on preparing and eating better balanced whole plant based foods for meals .

## 2024-01-20 NOTE — ED Provider Notes (Signed)
 [facility]    CSN: [id number] Arrival date & time: 01/20/24  1505      History   Chief Complaint No chief complaint on file.   HPI Tammy Chapman is a 33 y.o. female.   Patient presents today with a 3-day history of URI symptoms.  Reports cough, congestion, fever, headache, sinus pressure, sinus pain, shortness of breath, sore throat.  She denies any chest pain, nausea, vomiting.  Reports her husband was sick before she developed illness.  She does have a history of severe asthma and has been using her Airsupra  and maintenance medication regularly.  Denies any recent hospitalization related to asthma.  Denies any recent antibiotics or steroids.  She is followed by pulmonology.  She has had COVID several times in the past with last episode several years ago.  She has been using over-the-counter medications without improvement of symptoms.  She is confident that she is not pregnant.  She is having difficulty sleeping at night because of the severity of cough.    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/2019   Chronic asthma 01/19/2019   Ehlers-Danlos disease    Gallbladder problem    IBS (irritable bowel syndrome)    Joint pain    Lactose intolerance    Loose body of right knee 02/2013   PCOS (polycystic ovarian syndrome)    Pregnancy induced hypertension    Recurrent otitis media of both ears 01/19/2019   SOBOE (shortness of breath on exertion)    Sprain and strain of medial collateral ligament of knee 02/2013   right    Patient Active Problem List   Diagnosis Date Noted   Abnormal Pap smear of cervix 12/29/2023   Presence of tympanostomy tube in tympanic membrane 10/16/2023   URI (upper respiratory infection) 10/15/2023   Sinobronchitis 06/16/2023   Abnormal food appetite 05/25/2023   AOM (acute otitis media) 04/29/2023   Vitamin D  deficiency 03/19/2023   SOBOE  (shortness of breath on exertion) 03/04/2023   Pure hypercholesterolemia 03/04/2023   Class 2 obesity due to excess calories without serious comorbidity with body mass index (BMI) of 36.0 to 36.9 in adult 02/16/2023   Ehlers-Danlos disease 02/16/2023   PCOS (polycystic ovarian syndrome) 09/24/2021   Gastroesophageal reflux disease without esophagitis 02/27/2021   Allergic rhinitis 09/15/2019   Severe persistent asthma 01/19/2019   Bronchitis, mucopurulent recurrent (HCC) 01/19/2019   Irritable bowel syndrome with diarrhea 08/04/2011    Past Surgical History:  Procedure Laterality Date   CESAREAN SECTION N/A 01/08/2017   Procedure: CESAREAN SECTION;  Surgeon: Albino Hum, MD;  Location: [facility];  Service: Obstetrics;  Laterality: N/A;   CESAREAN SECTION N/A 07/01/2019   Procedure: REPEAT CESAREAN SECTION;  Surgeon: Albino Hum, MD;  Location: [facility];  Service: Obstetrics;  Laterality: N/A;   CHOLECYSTECTOMY  01/20/2008   EAR TUBE REMOVAL  2002   ESOPHAGOGASTRODUODENOSCOPY  05/15/2011   Procedure: ESOPHAGOGASTRODUODENOSCOPY (EGD);  Surgeon: Ruby Corporal, MD;  Location: [facility];  Service: Endoscopy;  Laterality: N/A;  3:15    KNEE ARTHROSCOPY Right 03/15/2013   Procedure: RIGHT  KNEE ARTHROSCOPY, FEMORAL PATELLA REEFING, LATERAL RELEASE, PARTIAL LATERAL MENISCECTOMY ;  Surgeon: Genevie Kerns, MD;  Location: [facility];  Service: Orthopedics;  Laterality: Right;   TONSILLECTOMY AND ADENOIDECTOMY  1999   TYMPANOSTOMY TUBE PLACEMENT  1999, 2011    OB History     Gravida  2   Para  2   Term  2   Preterm      AB      Living  2      SAB      IAB      Ectopic      Multiple      Live Births  2            Home Medications    Prior to Admission medications   Medication Sig Start Date End Date Taking? Authorizing Provider  chlorpheniramine-HYDROcodone  (TUSSIONEX) 10-8 MG/5ML Take 5 mLs by mouth every 12 (twelve) hours as  needed for cough. 01/20/24  Yes Barbarita Hutmacher K, PA-C  predniSONE  (STERAPRED UNI-PAK 21 TAB) 10 MG (21) TBPK tablet As directed 01/20/24  Yes Wai Litt K, PA-C  albuterol  (PROVENTIL ) (2.5 MG/3ML) 0.083% nebulizer solution Inhale 1 vial (2.5 mg total) by nebulization every 6 (six) hours as needed for wheezing or shortness of breath. 10/15/23   Cobb, Mariah Shines, NP  Albuterol -Budesonide  (AIRSUPRA ) 90-80 MCG/ACT AERO Inhale 2 puffs into the lungs 4 (four) times daily as needed. 01/20/24   Natesha Hassey K, PA-C  aspirin EC 81 MG tablet Take 81 mg by mouth daily. Swallow whole.    [provider]  budesonide -formoterol  (SYMBICORT ) 160-4.5 MCG/ACT inhaler Inhale 2 puffs into the lungs 2 (two) times daily. 03/18/23   Byrum, Robert S, MD  levocetirizine (XYZAL ) 5 MG tablet TAKE 1 TABLET BY MOUTH EVERY EVENING 04/17/23 04/16/24  Byrum, Robert S, MD  medroxyPROGESTERone  (PROVERA ) 10 MG tablet Take 1 tablet (10 mg total) by mouth daily. Every month if no spontaneous period 12/23/23   Ferd Householder, CNM  montelukast  (SINGULAIR ) 10 MG tablet TAKE 1 TABLET BY MOUTH AT BEDTIME 04/17/23 04/16/24  Denson Flake, MD  Tezepelumab -ekko (TEZSPIRE ) 210 MG/1. SOAJ Inject 210 mg into the skin every 28 (twenty-eight) days. 10/19/23   Jegede, Olugbemiga E, MD  Tiotropium Bromide  Monohydrate (SPIRIVA  RESPIMAT) 1.25 MCG/ACT AERS Inhale 2 puffs into the lungs daily. 03/18/23   Cobb, Mariah Shines, NP    Family History Family History  Problem Relation Age of Onset   Diabetes Mother    Depression Mother    Anxiety disorder Mother    Obesity Mother    Obesity Father    Anxiety disorder Father    Depression Father    Heart disease Father    Hypertension Father    Celiac disease Father    Kidney disease Father    Alcoholism Father    Ulcerative colitis Maternal Grandmother    Heart disease Maternal Grandmother    Diabetes Maternal Grandfather    COPD Maternal Grandfather    Congestive Heart Failure Maternal  Grandfather    Kidney disease Maternal Grandfather    Heart disease Paternal Grandmother    Renal cancer Paternal Grandfather    Heart disease Paternal Grandfather    Other Daughter        milk and soy allergy   Kidney disease Maternal Uncle    Colon cancer Neg Hx    Esophageal cancer Neg Hx    Stomach cancer Neg Hx    Rectal cancer Neg Hx     Social History Social History   Tobacco Use   Smoking status: Never   Smokeless tobacco: Never  Vaping Use   Vaping status: Never Used  Substance Use Topics   Alcohol use: No  Drug use: No     Allergies   Benadryl  [diphenhydramine  hcl], Oxycodone -acetaminophen , Penicillins, Adhesive [tape], Lactose intolerance (gi), Latex, Omnipaque  [iohexol ], Sulfa antibiotics, and Ultram [tramadol hcl]   Review of Systems Review of Systems  Constitutional:  Positive for activity change, fatigue and fever. Negative for appetite change.  HENT:  Positive for congestion, sinus pressure, sinus pain and sore throat. Negative for sneezing.   Respiratory:  Positive for cough and shortness of breath.   Cardiovascular:  Negative for chest pain.  Gastrointestinal:  Negative for abdominal pain, diarrhea, nausea and vomiting.  Musculoskeletal:  Positive for arthralgias and myalgias.  Neurological:  Positive for headaches. Negative for dizziness and light-headedness.     Physical Exam Triage Vital Signs ED Triage Vitals  Encounter Vitals Group     BP 01/20/24 1511 122/86     Systolic BP Percentile --      Diastolic BP Percentile --      Pulse Rate 01/20/24 1511 (!) 117     Resp 01/20/24 1511 16     Temp 01/20/24 1511 98.9 F (37.2 C)     Temp Source 01/20/24 1511 Oral     SpO2 01/20/24 1511 96 %     Weight --      Height --      Head Circumference --      Peak Flow --      Pain Score 01/20/24 1512 7     Pain Loc --      Pain Education --      Exclude from Growth Chart --    No data found.  Updated Vital Signs BP 122/86 (BP Location:  Right Arm)   Pulse (!) 117   Temp 98.9 F (37.2 C) (Oral)   Resp 16   LMP 01/18/2024 (Exact Date)   SpO2 96%   Visual Acuity Right Eye Distance:   Left Eye Distance:   Bilateral Distance:    Right Eye Near:   Left Eye Near:    Bilateral Near:     Physical Exam Vitals reviewed.  Constitutional:      General: She is awake. She is not in acute distress.    Appearance: Normal appearance. She is well-developed. She is not ill-appearing.     Comments: Very pleasant female appears stated age in no acute distress sitting comfortably in exam room  HENT:     Head: Normocephalic and atraumatic.     Right Ear: Tympanic membrane, ear canal and external ear normal. Tympanic membrane is not erythematous or bulging.     Left Ear: Tympanic membrane, ear canal and external ear normal. Tympanic membrane is not erythematous or bulging.     Nose:     Right Sinus: Maxillary sinus tenderness and frontal sinus tenderness present.     Left Sinus: Maxillary sinus tenderness and frontal sinus tenderness present.     Mouth/Throat:     Pharynx: Uvula midline. Posterior oropharyngeal erythema present. No oropharyngeal exudate.  Cardiovascular:     Rate and Rhythm: Regular rhythm. Tachycardia present.     Heart sounds: Normal heart sounds, S1 normal and S2 normal. No murmur heard. Pulmonary:     Effort: Pulmonary effort is normal.     Breath sounds: Normal breath sounds. No wheezing, rhonchi or rales.  Psychiatric:        Behavior: Behavior is cooperative.      UC Treatments / Results  Labs (all labs ordered are listed, but only abnormal results are displayed) Labs Reviewed  POC  COVID19/FLU A&B COMBO - Abnormal; Notable for the following components:      Result Value   Covid Antigen, POC Positive (*)    All other components within normal limits  POCT RAPID STREP A (OFFICE)    EKG   Radiology No results found.  Procedures Procedures (including critical care time)  Medications Ordered  in UC Medications  methylPREDNISolone  sodium succinate (SOLU-MEDROL ) 125 mg/2 mL injection 60 mg (60 mg Intramuscular Given 01/20/24 1533)    Initial Impression / Assessment and Plan / UC Course  I have reviewed the triage vital signs and the nursing notes.  Pertinent labs & imaging results that were available during my care of the patient were reviewed by me and considered in my medical decision making (see chart for details).     Patient is well-appearing, afebrile, nontoxic.  She is tachycardic which I suspect is related to viral illness and discomfort.  No evidence of acute infection on physical exam that warrant initiation of antibiotics.  She does have a history of asthma and has been using her Airsupra  every 4-6 hours as needed so concern for asthma exacerbation given increased use of rescue medication.  She was provided a refill of Airsupra  with instruction to rinse her mouth following use of this medication and will also start steroid treatment.  She was given 60 mg of Solu-Medrol  in clinic and will start prednisone  taper tomorrow (01/21/2024) we discussed that she is not to take NSAIDs with this medication due to risk of GI bleeding.  She requested Tussionex as Promethazine  DM is ineffective in managing her cough and she is having difficulty sleeping at night because of her symptoms.  We discussed that if we prescribe Tussionex we cannot use Paxlovid  for which she is a candidate due to her history of asthma due to concern for medication interaction.  She thought that the cough medicine would help her rest and be more effective so through shared decision making we elected not to initiate Paxlovid .  She is to rest and drink plenty of fluid.  Recommend close follow-up with her primary care.  She has a pulse oximeter at home and was encouraged to monitor oxygen saturation and if this drops below 90% she is to go to the ER.  Discussed that if anything worsens or changes she needs to be seen immediately.   Strict return precautions given.  Excuse note provided.  Final Clinical Impressions(s) / UC Diagnoses   Final diagnoses:  COVID-19  Moderate persistent asthma with acute exacerbation  Acute cough     Discharge Instructions      We are treating you for an asthma exacerbation.  Continue the Airsupra  every 4 hours as needed.  Rinse your mouth following use of this medication.  We gave an injection of steroids today so please start prednisone  tomorrow (01/21/2024).  Do not take NSAIDs with this medication including aspirin, ibuprofen /Advil , naproxen/Aleve.  I have called in Tussionex to help with your cough.  This will make you sleepy so do not drive or drink alcohol while taking it.  This medication contains an opioid which can be addictive and sedating so please try to limit use as much as possible.  We decided not to do Paxlovid  since that cannot be combined with Tussionex.  Monitor your oxygen saturation at home and if this is below 90% you need to go to the ER.  Follow-up with your pulmonologist as soon as possible.  If anything worsens please go to the emergency room  immediately as we discussed.  ED Prescriptions     Medication Sig Dispense Auth. Provider   Albuterol -Budesonide  (AIRSUPRA ) 90-80 MCG/ACT AERO Inhale 2 puffs into the lungs 4 (four) times daily as needed. 32.1 g Nicolena Schurman K, PA-C   chlorpheniramine-HYDROcodone  (TUSSIONEX) 10-8 MG/5ML Take 5 mLs by mouth every 12 (twelve) hours as needed for cough. 50 mL Kekai Geter K, PA-C   predniSONE  (STERAPRED UNI-PAK 21 TAB) 10 MG (21) TBPK tablet As directed 21 tablet Deklen Popelka K, PA-C      I have reviewed the PDMP during this encounter.   Budd Cargo, PA-C 01/20/24 1550

## 2024-01-24 ENCOUNTER — Telehealth: Admitting: Nurse Practitioner

## 2024-01-24 DIAGNOSIS — B001 Herpesviral vesicular dermatitis: Secondary | ICD-10-CM | POA: Diagnosis not present

## 2024-01-24 MED ORDER — VALACYCLOVIR HCL 1 G PO TABS: 2000.0000 mg | ORAL_TABLET | Freq: Two times a day (BID) | ORAL | 0 refills | Status: AC

## 2024-01-24 NOTE — Progress Notes (Signed)
 I have spent 5 minutes in review of e-visit questionnaire, review and updating patient chart, medical decision making and response to patient.   Claiborne Rigg, NP

## 2024-01-24 NOTE — Progress Notes (Signed)

## 2024-02-05 ENCOUNTER — Other Ambulatory Visit: Payer: Self-pay

## 2024-02-05 ENCOUNTER — Telehealth: Payer: Self-pay | Admitting: Emergency Medicine

## 2024-02-05 DIAGNOSIS — J4551 Severe persistent asthma with (acute) exacerbation: Secondary | ICD-10-CM

## 2024-02-05 MED ORDER — TEZSPIRE 210 MG/1.91ML ~~LOC~~ SOAJ
210.0000 mg | SUBCUTANEOUS | 2 refills | Status: DC
  Filled 2024-02-05 – 2024-02-08 (×2): qty 1.91, 28d supply, fill #0

## 2024-02-05 NOTE — Telephone Encounter (Signed)
 LVMTCB to schedule follow up visit with APP's.

## 2024-02-05 NOTE — Telephone Encounter (Signed)
 Refill sent for TEZSPIRE  to [facility]: [phone]   Dose: 210mg  subcut every 4 weeks  Last OV: 10/15/2023 Provider: Canary Ceo, NP  Next OV: due in August 2025  Routing to scheduling team for follow-up on appt scheduling  Geraldene Kleine, PharmD, MPH, BCPS Clinical Pharmacist (Rheumatology and Pulmonology)

## 2024-02-07 ENCOUNTER — Encounter (INDEPENDENT_AMBULATORY_CARE_PROVIDER_SITE_OTHER): Payer: Self-pay

## 2024-02-08 ENCOUNTER — Other Ambulatory Visit: Payer: Self-pay

## 2024-02-08 NOTE — Progress Notes (Signed)
 Specialty Pharmacy Refill Coordination Note  Tammy Chapman is a 33 y.o. female contacted today regarding refills of specialty medication(s) Tezepelumab -ekko (Tezspire )   Patient requested Delivery   Delivery date: 02/11/24   Verified address: [address] [place], [place] [address]   Medication will be filled on 02/10/24.

## 2024-02-10 ENCOUNTER — Ambulatory Visit: Admitting: Nutrition

## 2024-02-10 NOTE — Telephone Encounter (Signed)
 Pt scheduled 8/14 with Katie.

## 2024-02-23 ENCOUNTER — Other Ambulatory Visit: Payer: Self-pay

## 2024-02-24 ENCOUNTER — Other Ambulatory Visit: Payer: Self-pay | Admitting: Pharmacist

## 2024-02-24 ENCOUNTER — Other Ambulatory Visit: Payer: Self-pay

## 2024-02-24 ENCOUNTER — Other Ambulatory Visit (HOSPITAL_COMMUNITY): Payer: Self-pay

## 2024-02-24 DIAGNOSIS — J4551 Severe persistent asthma with (acute) exacerbation: Secondary | ICD-10-CM

## 2024-02-24 MED ORDER — TEZSPIRE 210 MG/1.91ML ~~LOC~~ SOAJ
210.0000 mg | SUBCUTANEOUS | 2 refills | Status: DC
Start: 2024-02-24 — End: 2024-06-06
  Filled 2024-02-24 – 2024-03-03 (×2): qty 1.91, 28d supply, fill #0
  Filled 2024-03-31: qty 1.91, 28d supply, fill #1
  Filled 2024-04-27: qty 1.91, 28d supply, fill #2

## 2024-03-03 ENCOUNTER — Other Ambulatory Visit: Payer: Self-pay

## 2024-03-03 ENCOUNTER — Other Ambulatory Visit (HOSPITAL_COMMUNITY): Payer: Self-pay

## 2024-03-03 ENCOUNTER — Encounter (INDEPENDENT_AMBULATORY_CARE_PROVIDER_SITE_OTHER): Payer: Self-pay

## 2024-03-03 NOTE — Progress Notes (Signed)
 Specialty Pharmacy Refill Coordination Note  Tammy Chapman is a 33 y.o. female contacted today regarding refills of specialty medication(s) Tezepelumab -ekko (Tezspire )   Patient requested (Patient-Rptd) Delivery   Delivery date: 03/09/24   Verified address: (Patient-Rptd) [address] [place] [address]   Medication will be filled on 03/08/24.

## 2024-03-19 ENCOUNTER — Telehealth: Admitting: Nurse Practitioner

## 2024-03-19 DIAGNOSIS — B001 Herpesviral vesicular dermatitis: Secondary | ICD-10-CM

## 2024-03-19 MED ORDER — VALACYCLOVIR HCL 1 G PO TABS: 2000.0000 mg | ORAL_TABLET | Freq: Two times a day (BID) | ORAL | 0 refills | Status: AC

## 2024-03-19 NOTE — Progress Notes (Signed)
 I have spent 5 minutes in review of e-visit questionnaire, review and updating patient chart, medical decision making and response to patient.   Claiborne Rigg, NP

## 2024-03-19 NOTE — Progress Notes (Signed)

## 2024-03-29 ENCOUNTER — Other Ambulatory Visit: Payer: Self-pay

## 2024-03-30 ENCOUNTER — Encounter (INDEPENDENT_AMBULATORY_CARE_PROVIDER_SITE_OTHER): Payer: Self-pay

## 2024-03-31 ENCOUNTER — Encounter: Payer: Self-pay | Admitting: Nurse Practitioner

## 2024-03-31 ENCOUNTER — Other Ambulatory Visit (HOSPITAL_COMMUNITY): Payer: Self-pay

## 2024-03-31 ENCOUNTER — Ambulatory Visit (INDEPENDENT_AMBULATORY_CARE_PROVIDER_SITE_OTHER)

## 2024-03-31 ENCOUNTER — Other Ambulatory Visit: Payer: Self-pay

## 2024-03-31 ENCOUNTER — Ambulatory Visit (INDEPENDENT_AMBULATORY_CARE_PROVIDER_SITE_OTHER): Admitting: Nurse Practitioner

## 2024-03-31 VITALS — BP 122/81 | HR 78 | Temp 97.7°F | Ht 70.0 in | Wt 235.0 lb

## 2024-03-31 DIAGNOSIS — J45909 Unspecified asthma, uncomplicated: Secondary | ICD-10-CM | POA: Diagnosis not present

## 2024-03-31 DIAGNOSIS — R918 Other nonspecific abnormal finding of lung field: Secondary | ICD-10-CM | POA: Diagnosis not present

## 2024-03-31 DIAGNOSIS — J4550 Severe persistent asthma, uncomplicated: Secondary | ICD-10-CM

## 2024-03-31 DIAGNOSIS — J301 Allergic rhinitis due to pollen: Secondary | ICD-10-CM

## 2024-03-31 DIAGNOSIS — U071 COVID-19: Secondary | ICD-10-CM | POA: Diagnosis not present

## 2024-03-31 DIAGNOSIS — U099 Post covid-19 condition, unspecified: Secondary | ICD-10-CM | POA: Diagnosis not present

## 2024-03-31 LAB — POCT EXHALED NITRIC OXIDE: FeNO level (ppb): 14

## 2024-03-31 MED ORDER — MONTELUKAST SODIUM 10 MG PO TABS
10.0000 mg | ORAL_TABLET | Freq: Every day | ORAL | 11 refills | Status: AC
  Filled 2024-03-31 – 2024-05-13 (×2): qty 30, 30d supply, fill #0
  Filled 2024-06-23: qty 30, 30d supply, fill #1
  Filled 2024-07-21: qty 30, 30d supply, fill #2
  Filled 2024-08-17: qty 30, 30d supply, fill #3
  Filled 2024-09-21: qty 30, 30d supply, fill #4

## 2024-03-31 MED ORDER — LEVOCETIRIZINE DIHYDROCHLORIDE 5 MG PO TABS
5.0000 mg | ORAL_TABLET | Freq: Every evening | ORAL | 11 refills | Status: AC
  Filled 2024-03-31 – 2024-05-13 (×2): qty 30, 30d supply, fill #0
  Filled 2024-06-23 (×2): qty 30, 30d supply, fill #1
  Filled 2024-08-17: qty 30, 30d supply, fill #2
  Filled 2024-09-21: qty 30, 30d supply, fill #3

## 2024-03-31 MED ORDER — ALBUTEROL SULFATE (2.5 MG/3ML) 0.083% IN NEBU
2.5000 mg | INHALATION_SOLUTION | Freq: Four times a day (QID) | RESPIRATORY_TRACT | 5 refills | Status: AC | PRN
Start: 2024-03-31 — End: ?
  Filled 2024-03-31: qty 75, 7d supply, fill #0

## 2024-03-31 MED ORDER — AIRSUPRA 90-80 MCG/ACT IN AERO
2.0000 | INHALATION_SPRAY | Freq: Four times a day (QID) | RESPIRATORY_TRACT | 1 refills | Status: AC | PRN
  Filled 2024-03-31: qty 32.1, 90d supply, fill #0

## 2024-03-31 MED ORDER — SPIRIVA RESPIMAT 1.25 MCG/ACT IN AERS
2.0000 | INHALATION_SPRAY | Freq: Every day | RESPIRATORY_TRACT | 11 refills | Status: AC
  Filled 2024-03-31 – 2024-05-16 (×3): qty 4, 30d supply, fill #0
  Filled 2024-06-23: qty 4, 30d supply, fill #1
  Filled 2024-07-21: qty 4, 30d supply, fill #2
  Filled 2024-08-17: qty 4, 30d supply, fill #3
  Filled 2024-09-21: qty 4, 30d supply, fill #4

## 2024-03-31 MED ORDER — BUDESONIDE-FORMOTEROL FUMARATE 160-4.5 MCG/ACT IN AERO
2.0000 | INHALATION_SPRAY | Freq: Two times a day (BID) | RESPIRATORY_TRACT | 11 refills | Status: AC
  Filled 2024-03-31 – 2024-05-13 (×2): qty 10.2, 30d supply, fill #0
  Filled 2024-07-21: qty 10.2, 30d supply, fill #1
  Filled 2024-08-17: qty 10.2, 30d supply, fill #2
  Filled 2024-09-21: qty 10.2, 30d supply, fill #3

## 2024-03-31 NOTE — Assessment & Plan Note (Signed)
 Severe asthmatic on biologic therapy.  Prone to recurrent exacerbations, typically in setting of viral illnesses.  Last exacerbation June 2025 associated with COVID.  Clinically improved and stable today.  Lung exam reassuring.  Exhaled nitric oxide  testing is normal indicating lack of inflammation throughout the airways.  Advised her to continue triple therapy regimen on biologic and trigger prevention with allergy regimen.  Action plan in place.  Patient Instructions  Continue Airsupra  inhaler 2 puffs or albuterol  3 mL neb every 6 hours as needed for shortness of breath or wheezing. Notify if symptoms persist despite rescue inhaler/neb use.  Continue Symbicort  2 puffs Twice daily. Brush tongue and rinse mouth afterwards Continue Spiriva  2 puffs daily  Continue dymista  nasal spray 2 sprays each nostril daily Continue Xyzal  1 tab daily Continue singulair  1 tab At bedtime  Continue Tezspire  every 28 days   Your breathing test today was normal  Will schedule a repeat chest x ray since you have COVID again in June and have had some increased shortness of breath at night Repeat lung function testing to ensure things look stable If there's any abnormalities, we can consider a CT scan of the chest    Follow up in 6-8 weeks after PFT with Dr. Shelah (1st). If symptoms do not improve or worsen, please contact office for sooner follow up or seek emergency care.

## 2024-03-31 NOTE — Progress Notes (Signed)
 Specialty Pharmacy Refill Coordination Note  MyChart Questionnaire Submission  Tammy Chapman is a 33 y.o. female contacted today regarding refills of specialty medication(s) Tezspire .  Doses on hand: (Patient-Rptd) Idk - 0, patient injecting once/28 days  Injection date: (Patient-Rptd) 04/08/24  Patient requested: (Patient-Rptd) Delivery   Delivery date: 04/05/24  Verified address: [address] [place] [address]  Medication will be filled on 04/04/24.

## 2024-03-31 NOTE — Assessment & Plan Note (Signed)
 Stable on current regimen

## 2024-03-31 NOTE — Assessment & Plan Note (Signed)
 Ongoing dyspnea which is more likely related to asthma, body habitus and some deconditioning.  Has had COVID 4 times.  Will obtain chest x-ray to assess for any lung parenchymal abnormalities and repeat her pulmonary function testing.  Encouraged to work on graded exercises.
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
## 2024-03-31 NOTE — Patient Instructions (Addendum)
 Continue Airsupra  inhaler 2 puffs or albuterol  3 mL neb every 6 hours as needed for shortness of breath or wheezing. Notify if symptoms persist despite rescue inhaler/neb use.  Continue Symbicort  2 puffs Twice daily. Brush tongue and rinse mouth afterwards Continue Spiriva  2 puffs daily  Continue dymista  nasal spray 2 sprays each nostril daily Continue Xyzal  1 tab daily Continue singulair  1 tab At bedtime  Continue Tezspire  every 28 days   Your breathing test today was normal  Will schedule a repeat chest x ray since you have COVID again in June and have had some increased shortness of breath at night Repeat lung function testing to ensure things look stable If there's any abnormalities, we can consider a CT scan of the chest    Follow up in 6-8 weeks after PFT with Dr. Shelah (1st). If symptoms do not improve or worsen, please contact office for sooner follow up or seek emergency care.

## 2024-04-01 ENCOUNTER — Other Ambulatory Visit: Payer: Self-pay

## 2024-04-01 ENCOUNTER — Ambulatory Visit: Payer: Self-pay | Admitting: Nurse Practitioner

## 2024-04-01 NOTE — Progress Notes (Signed)
 Mild changes of asthma otherwise no areas of concern

## 2024-04-04 ENCOUNTER — Other Ambulatory Visit: Payer: Self-pay

## 2024-04-05 NOTE — Telephone Encounter (Addendum)
  Called regarding patient's Chest-Xray result. LVMTCB  ----- Message from Comer LULLA Rouleau sent at 04/01/2024  1:57 PM EDT ----- Mild changes of asthma otherwise no areas of concern ----- Message ----- From: Interface, Rad Results In Sent: 04/01/2024   1:13 PM EDT To: Comer Rouleau LULLA, NP

## 2024-04-15 ENCOUNTER — Telehealth: Admitting: Physician Assistant

## 2024-04-15 DIAGNOSIS — J029 Acute pharyngitis, unspecified: Secondary | ICD-10-CM | POA: Diagnosis not present

## 2024-04-15 DIAGNOSIS — R6889 Other general symptoms and signs: Secondary | ICD-10-CM | POA: Diagnosis not present

## 2024-04-15 MED ORDER — NAPROXEN 500 MG PO TABS: 500.0000 mg | ORAL_TABLET | Freq: Two times a day (BID) | ORAL | 0 refills | Status: DC

## 2024-04-15 MED ORDER — ONDANSETRON 4 MG PO TBDP: 4.0000 mg | ORAL_TABLET | Freq: Three times a day (TID) | ORAL | 0 refills | Status: DC | PRN

## 2024-04-15 MED ORDER — FLUTICASONE PROPIONATE 50 MCG/ACT NA SUSP: 2.0000 | Freq: Every day | NASAL | 0 refills | Status: DC

## 2024-04-15 NOTE — Progress Notes (Signed)
 E-Visit for [employer] Virus / COVID Screening  Your current symptoms could be consistent with COVID or Influenza.  Please complete a Covid +Flu test either at home or check with your local pharmacy to see if they provide testing.    If you have tested positive for Covid or Flu, meaning that you were infected with that virus and could give the virus to others.  Most people with Covid or Flu have mild illness and can recover at home without medical care. Do not leave your home, except to get medical care. Do not visit public areas and do not go to places where you are unable to wear a mask. It is important that you stay home  to take care for yourself and to help protect other people in your home and community.      Isolation Instructions:   You are to isolate at home until you have been fever free for at least 24 hours without a fever-reducing medication, and symptoms have been steadily improving for 24 hours. At that time,  you can end isolation but need to mask for an additional 5 days.  If you must be around other household members who do not have symptoms, you need to make sure that both you and the family members are masking consistently with a high-quality mask.  If you note any worsening of symptoms despite treatment, please seek an in-person evaluation ASAP. If you note any significant shortness of breath or any chest pain, please seek ER evaluation. Please do not delay care!  Go to the nearest hospital ED for assessment if fever/cough/breathlessness are severe or illness seems like a threat to life.    The following symptoms may appear 2-14 days after exposure: Fever Cough Shortness of breath or difficulty breathing Chills Repeated shaking with chills Muscle pain Headache Sore throat New loss of taste or smell Fatigue Congestion or runny nose Nausea or vomiting Diarrhea  You can use medication such as I have prescribed an anti-inflammatory - Naprosyn  500 mg. Take twice daily as  needed for fever or body aches for 2 weeks, I have prescribed Fluticasone  nasal spray 2 sprays in each nostril one time per dayasal spray 2 sprays in each nostril one time per day, and I have prescribed Zofran  4 mg tablets 1 every 6 hours if needed for nausea  You may also take acetaminophen  (Tylenol ) as needed for fever.  HOME CARE Only take medications as instructed by your medical team. Drink plenty of fluids and get plenty of rest. A steam or ultrasonic humidifier can help if you have congestion.  GET HELP RIGHT AWAY IF YOU HAVE EMERGENCY WARNING SIGNS.  Call 911 or proceed to your closest emergency facility if: You develop worsening high fever. Trouble breathing Bluish lips or face Persistent pain or pressure in the chest New confusion Inability to wake or stay awake You cough up blood. Your symptoms become more severe Inability to hold down food or fluids  This list is not all possible symptoms. Contact your medical provider for any symptoms that are severe or concerning to you.   Your e-visit answers were reviewed by a board certified advanced clinical practitioner to complete your personal care plan.  Depending on the condition, your plan could have included both over the counter or prescription medications.  If there is a problem, please reply once you have received a response from your provider.  Your safety is important to us .  If you have drug allergies check your prescription  carefully.    You can use MyChart to ask questions about today's visit, request a non-urgent call back, or ask for a work or school excuse for 24 hours related to this e-Visit. If it has been greater than 24 hours you will need to follow up with your provider or enter a new e-Visit to address those concerns. You will get an e-mail in the next two days asking about your experience.  I hope that your e-visit has been valuable and will speed your recovery. Thank you for using e-visits.    I have spent 5  minutes in review of e-visit questionnaire, review and updating patient chart, medical decision making and response to patient.   Delon CHRISTELLA Dickinson, PA-C

## 2024-04-27 ENCOUNTER — Other Ambulatory Visit: Payer: Self-pay

## 2024-04-27 NOTE — Progress Notes (Signed)
 Specialty Pharmacy Refill Coordination Note  Tammy Chapman is a 33 y.o. female contacted today regarding refills of specialty medication(s) Tezepelumab -ekko (Tezspire )   Patient requested Delivery   Delivery date: 05/10/24   Verified address: [address] [place] [address]   Medication will be filled on 09.22.25.   Next injection 9.26.25

## 2024-05-04 ENCOUNTER — Telehealth: Admitting: Physician Assistant

## 2024-05-04 DIAGNOSIS — J019 Acute sinusitis, unspecified: Secondary | ICD-10-CM

## 2024-05-04 DIAGNOSIS — B9689 Other specified bacterial agents as the cause of diseases classified elsewhere: Secondary | ICD-10-CM

## 2024-05-04 MED ORDER — DOXYCYCLINE HYCLATE 100 MG PO TABS: 100.0000 mg | ORAL_TABLET | Freq: Two times a day (BID) | ORAL | 0 refills | Status: DC

## 2024-05-04 NOTE — Progress Notes (Signed)

## 2024-05-09 ENCOUNTER — Other Ambulatory Visit: Payer: Self-pay

## 2024-05-12 ENCOUNTER — Other Ambulatory Visit: Payer: Self-pay

## 2024-05-13 ENCOUNTER — Other Ambulatory Visit (HOSPITAL_BASED_OUTPATIENT_CLINIC_OR_DEPARTMENT_OTHER): Payer: Self-pay

## 2024-05-16 ENCOUNTER — Other Ambulatory Visit: Payer: Self-pay

## 2024-05-16 ENCOUNTER — Other Ambulatory Visit (HOSPITAL_BASED_OUTPATIENT_CLINIC_OR_DEPARTMENT_OTHER): Payer: Self-pay

## 2024-05-20 ENCOUNTER — Other Ambulatory Visit (HOSPITAL_BASED_OUTPATIENT_CLINIC_OR_DEPARTMENT_OTHER): Payer: Self-pay

## 2024-05-20 MED ORDER — FLUBLOK 0.5 ML IM SOSY
0.5000 mL | PREFILLED_SYRINGE | Freq: Once | INTRAMUSCULAR | 0 refills | Status: AC
  Filled 2024-05-20: qty 0.5, 1d supply, fill #0

## 2024-05-26 ENCOUNTER — Ambulatory Visit (HOSPITAL_COMMUNITY)
Admission: RE | Admit: 2024-05-26 | Discharge: 2024-05-26 | Disposition: A | Discharge status: Home / Self Care | Source: Ambulatory Visit | Attending: Nurse Practitioner | Admitting: Nurse Practitioner

## 2024-05-26 DIAGNOSIS — J4550 Severe persistent asthma, uncomplicated: Secondary | ICD-10-CM | POA: Diagnosis not present

## 2024-05-26 LAB — PULMONARY FUNCTION TEST
DL/VA % pred: 116 %
DL/VA: 5.08 ml/min/mmHg/L
DLCO unc % pred: 100 %
DLCO unc: 27.39 ml/min/mmHg
FEF 25-75 Post: 5.06 L/s
FEF 25-75 Pre: 3.99 L/s
FEF2575-%Change-Post: 26 %
FEF2575-%Pred-Post: 134 %
FEF2575-%Pred-Pre: 106 %
FEV1-%Change-Post: 5 %
FEV1-%Pred-Post: 104 %
FEV1-%Pred-Pre: 98 %
FEV1-Post: 3.91 L
FEV1-Pre: 3.71 L
FEV1FVC-%Change-Post: 3 %
FEV1FVC-%Pred-Pre: 98 %
FEV6-%Change-Post: 2 %
FEV6-%Pred-Post: 102 %
FEV6-%Pred-Pre: 99 %
FEV6-Post: 4.58 L
FEV6-Pre: 4.47 L
FEV6FVC-%Pred-Post: 101 %
FEV6FVC-%Pred-Pre: 101 %
FVC-%Change-Post: 2 %
FVC-%Pred-Post: 100 %
FVC-%Pred-Pre: 98 %
FVC-Post: 4.58 L
FVC-Pre: 4.47 L
Post FEV1/FVC ratio: 85 %
Post FEV6/FVC ratio: 100 %
Pre FEV1/FVC ratio: 83 %
Pre FEV6/FVC Ratio: 100 %
RV % pred: 117 %
RV: 2.03 L
TLC % pred: 107 %
TLC: 6.4 L

## 2024-05-26 MED ORDER — ALBUTEROL SULFATE (2.5 MG/3ML) 0.083% IN NEBU
2.5000 mg | INHALATION_SOLUTION | Freq: Once | RESPIRATORY_TRACT | Status: AC
  Administered 2024-05-26: 2.5 mg via RESPIRATORY_TRACT

## 2024-05-26 NOTE — Progress Notes (Signed)
 ATCx1, LVMTCB

## 2024-05-26 NOTE — Progress Notes (Signed)
 Lung function testing normal

## 2024-05-26 NOTE — Progress Notes (Signed)
 [employer], NFN.

## 2024-05-30 ENCOUNTER — Other Ambulatory Visit: Payer: Self-pay | Admitting: Nurse Practitioner

## 2024-05-30 ENCOUNTER — Other Ambulatory Visit: Payer: Self-pay

## 2024-05-30 ENCOUNTER — Other Ambulatory Visit (HOSPITAL_COMMUNITY): Payer: Self-pay

## 2024-05-30 DIAGNOSIS — J4551 Severe persistent asthma with (acute) exacerbation: Secondary | ICD-10-CM

## 2024-05-30 MED ORDER — TEZSPIRE 210 MG/1.91ML ~~LOC~~ SOAJ
210.0000 mg | SUBCUTANEOUS | 2 refills | Status: DC
  Filled 2024-05-30 – 2024-06-02 (×3): qty 1.91, 28d supply, fill #0

## 2024-06-01 ENCOUNTER — Other Ambulatory Visit: Payer: Self-pay

## 2024-06-01 ENCOUNTER — Telehealth: Payer: Self-pay

## 2024-06-01 ENCOUNTER — Other Ambulatory Visit (HOSPITAL_COMMUNITY): Payer: Self-pay

## 2024-06-01 NOTE — Progress Notes (Signed)
 See note

## 2024-06-01 NOTE — Telephone Encounter (Signed)
 Received notification via specialty pharmacy encounter that Tezspire  requires a pa. Submitted an Urgent Prior Authorization request to [employer] for TEZSPIRE  via CoverMyMeds. Will update once we receive a response.  Key: [id number]

## 2024-06-01 NOTE — Telephone Encounter (Signed)
 Received notification from [facility] regarding a prior authorization for TEZSPIRE . Authorization has been APPROVED from 06/01/24 to 05/31/25. Approval letter sent to scan center.  Per test claim, copay for 28 days supply is $0  Patient can continue to fill through [facility]: [phone]   Authorization # [id number] Phone # [phone]

## 2024-06-01 NOTE — Progress Notes (Unsigned)
 PA approved until 05/31/25.

## 2024-06-02 ENCOUNTER — Ambulatory Visit: Admitting: Emergency Medicine

## 2024-06-02 ENCOUNTER — Encounter: Payer: Self-pay | Admitting: Emergency Medicine

## 2024-06-02 ENCOUNTER — Other Ambulatory Visit: Payer: Self-pay

## 2024-06-02 ENCOUNTER — Encounter

## 2024-06-02 ENCOUNTER — Encounter (INDEPENDENT_AMBULATORY_CARE_PROVIDER_SITE_OTHER): Payer: Self-pay

## 2024-06-02 VITALS — BP 122/64 | HR 73 | Temp 98.1°F | Ht 70.0 in | Wt 245.4 lb

## 2024-06-02 DIAGNOSIS — J4550 Severe persistent asthma, uncomplicated: Secondary | ICD-10-CM

## 2024-06-02 DIAGNOSIS — J301 Allergic rhinitis due to pollen: Secondary | ICD-10-CM

## 2024-06-02 NOTE — Assessment & Plan Note (Signed)
 Her symptoms and quality of life are improved since she got on the Tezspire .  She has had 2 flares this year that required prednisone , 1 was in the setting of COVID-19.  Her pulmonary function testing was reviewed, airflows are actually normal without a bronchodilator response.  Her methacholine  challenge in 2019 was positive and consistent with asthma.  Did discuss with her that she has both upper airway irritation and true asthma that contribute to her symptoms.  Also discussed with her the potential triggers, exacerbating factors including chronic rhinitis and GERD.  Finally discussed appropriate vaccinations with her.  We reviewed your pulmonary function testing today Please continue Tezspire  as you have been taking Continue your Spiriva  and Symbicort  as ordered Keep your Airsupra  and albuterol  available use 2 puffs when needed for shortness of breath, chest tightness, wheezing Continue Xyzal  and Singulair  Try using over-the-counter decongestant like Sudafed for the next 5 days Flu shot is up-to-date You can consider getting the COVID-19 vaccine this fall The RSV vaccine is not recommended for patients with asthma who are under the age 8 Please follow with Dr. Shelah in 6 months.  Call sooner if you have any problems.

## 2024-06-02 NOTE — Progress Notes (Signed)
 Subjective:    Patient ID: Tammy Chapman, female    DOB: [birth year], 33 y.o.   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  She has most recently been seen by K. Cobb in our office.  She is currently managed on Tezspire  (formerly Xolair ), Xyzal , Singulair  and albuterol  as needed.  She had COVID-19 in June 2025. Today she reports that she has overall been doing well. Occasional flares - was on prednisone  x 2 this year, at least once caused by COVID. Flares have dyspnea and barking cough. No reflux.   Pulmonary function testing 03/31/2024 reviewed by me, shows normal airflows without a bronchodilator response, normal lung volumes, normal diffusion capacity.  The FEV1 is overall stable over the last 6 years.   Review of Systems As per HPI      Objective:   Physical Exam Today's Vitals   06/02/24 1607  BP: 122/64  Pulse: 73  Temp: 98.1 F (36.7 C)  TempSrc: Oral  SpO2: 97%  Weight: 245 lb 6.4 oz (111.3 kg)  Height: 5' 10 (1.778 m)   Body mass index is 35.21 kg/m.   Gen: Pleasant, well-nourished, in no distress,  normal affect  ENT: No lesions,  mouth clear,  oropharynx clear, no postnasal drip.  Voice is slightly hoarse.  Ear exam shows tubes in place bilaterally, some blood-tinged wax without occlusion on the left, clear on the right, no purulence  Neck: No JVD, no UA noise  Lungs: No use of accessory muscles, no crackles or wheezing on normal respiration, no wheeze on forced expiration  Cardiovascular: RRR, heart sounds normal, no murmur or gallops, no peripheral edema  Musculoskeletal: No deformities, no cyanosis or clubbing  Neuro: alert, awake, non focal  Skin: Warm, no lesions or rash     Assessment & Plan:  Severe persistent asthma Her symptoms and quality of life are improved since she got on the Tezspire .   She has had 2 flares this year that required prednisone , 1 was in the setting of COVID-19.  Her pulmonary function testing was reviewed, airflows are actually normal without a bronchodilator response.  Her methacholine  challenge in 2019 was positive and consistent with asthma.  Did discuss with her that she has both upper airway irritation and true asthma that contribute to her symptoms.  Also discussed with her the potential triggers, exacerbating factors including chronic rhinitis and GERD.  Finally discussed appropriate vaccinations with her.  We reviewed your pulmonary function testing today Please continue Tezspire  as you have been taking Continue your Spiriva  and Symbicort  as ordered Keep your Airsupra  and albuterol  available use 2 puffs when needed for shortness of breath, chest tightness, wheezing Continue Xyzal  and Singulair  Try using over-the-counter decongestant like Sudafed for the next 5 days Flu shot is up-to-date You can consider getting the COVID-19 vaccine this fall The RSV vaccine is not recommended for patients with asthma who are under the age 32 Please follow with Dr. Shelah in 6 months.  Call sooner if you have any problems.  Allergic rhinitis More congestion and some ear drainage, especially from the left ear, serous with some blood tingeing.  No purulence.  Have asked her to try using Sudafed temporarily to help with drainage, avoid an overt otitis  Time spent 33 minutes reviewing symptoms, pulmonary function testing, prior methacholine  testing, contributing factors, care plan  Lamar Shelah, MD, PhD  06/02/2024, 4:45 PM [facility] [phone] or if no answer [phone]

## 2024-06-02 NOTE — Assessment & Plan Note (Signed)
 More congestion and some ear drainage, especially from the left ear, serous with some blood tingeing.  No purulence.  Have asked her to try using Sudafed temporarily to help with drainage, avoid an overt otitis

## 2024-06-02 NOTE — Progress Notes (Signed)
 Specialty Pharmacy Refill Coordination Note  Anne Boltz is a 33 y.o. female contacted today regarding refills of specialty medication(s) Tezepelumab -ekko (Tezspire )   Patient requested (Patient-Rptd) Delivery   Delivery date: 06/03/24   Verified address: (Patient-Rptd) [address], [place], [address]   Medication will be filled on 06/02/24.

## 2024-06-02 NOTE — Patient Instructions (Addendum)
 We reviewed your pulmonary function testing today Please continue Tezspire  as you have been taking Continue your Spiriva  and Symbicort  as ordered Keep your Airsupra  and albuterol  available use 2 puffs when needed for shortness of breath, chest tightness, wheezing Continue Xyzal  and Singulair  Try using over-the-counter decongestant like Sudafed for the next 5 days Flu shot is up-to-date You can consider getting the COVID-19 vaccine this fall The RSV vaccine is not recommended for patients with asthma who are under the age 85 Please follow with Dr. Shelah in 6 months.  Call sooner if you have any problems.

## 2024-06-06 ENCOUNTER — Other Ambulatory Visit: Payer: Self-pay | Admitting: Pharmacist

## 2024-06-06 ENCOUNTER — Other Ambulatory Visit (HOSPITAL_COMMUNITY): Payer: Self-pay

## 2024-06-06 ENCOUNTER — Other Ambulatory Visit: Payer: Self-pay

## 2024-06-06 DIAGNOSIS — J4551 Severe persistent asthma with (acute) exacerbation: Secondary | ICD-10-CM

## 2024-06-06 MED ORDER — TEZSPIRE 210 MG/1.91ML ~~LOC~~ SOAJ
210.0000 mg | SUBCUTANEOUS | 2 refills | Status: DC
  Filled 2024-06-06 – 2024-06-23 (×2): qty 1.91, 28d supply, fill #0
  Filled 2024-07-18: qty 1.91, 28d supply, fill #1
  Filled 2024-08-17: qty 1.91, 28d supply, fill #2

## 2024-06-23 ENCOUNTER — Other Ambulatory Visit (HOSPITAL_COMMUNITY): Payer: Self-pay

## 2024-06-23 ENCOUNTER — Encounter (INDEPENDENT_AMBULATORY_CARE_PROVIDER_SITE_OTHER): Payer: Self-pay

## 2024-06-23 ENCOUNTER — Other Ambulatory Visit: Payer: Self-pay | Admitting: Pharmacy Technician

## 2024-06-23 ENCOUNTER — Other Ambulatory Visit: Payer: Self-pay

## 2024-06-23 ENCOUNTER — Other Ambulatory Visit (HOSPITAL_BASED_OUTPATIENT_CLINIC_OR_DEPARTMENT_OTHER): Payer: Self-pay

## 2024-06-23 NOTE — Progress Notes (Signed)
 Specialty Pharmacy Refill Coordination Note  Tammy Chapman is a 33 y.o. female contacted today regarding refills of specialty medication(s) Tezepelumab -ekko (Tezspire )   Patient requested (Patient-Rptd) Delivery   Delivery date: 06/28/2024 Verified address: (Patient-Rptd) [address] [place] [address]   Medication will be filled on: 06/27/2024

## 2024-06-24 ENCOUNTER — Other Ambulatory Visit (HOSPITAL_BASED_OUTPATIENT_CLINIC_OR_DEPARTMENT_OTHER): Payer: Self-pay

## 2024-06-25 ENCOUNTER — Telehealth: Admitting: Family Medicine

## 2024-06-25 DIAGNOSIS — R051 Acute cough: Secondary | ICD-10-CM

## 2024-06-25 MED ORDER — PREDNISONE 10 MG (21) PO TBPK: ORAL_TABLET | ORAL | 0 refills | Status: DC

## 2024-06-25 MED ORDER — BENZONATATE 100 MG PO CAPS: 100.0000 mg | ORAL_CAPSULE | Freq: Three times a day (TID) | ORAL | 0 refills | Status: AC | PRN

## 2024-06-25 NOTE — Progress Notes (Signed)
 We are sorry that you are not feeling well.  Here is how we plan to help!  Based on your presentation I believe you most likely have A cough due to a virus.  This is called viral bronchitis and is best treated by rest, plenty of fluids and control of the cough.  You may use Ibuprofen  or Tylenol  as directed to help your symptoms.     In addition you may use A prescription cough medication called Tessalon  Perles 100mg . You may take 1-2 capsules every 8 hours as needed for your cough.  Also prescribed a Prednisone  taper to help with cough.  Directions for 6 day taper: Day 1: 2 tablets before breakfast, 1 after both lunch & dinner and 2 at bedtime Day 2: 1 tab before breakfast, 1 after both lunch & dinner and 2 at bedtime Day 3: 1 tab at each meal & 1 at bedtime Day 4: 1 tab at breakfast, 1 at lunch, 1 at bedtime Day 5: 1 tab at breakfast & 1 tab at bedtime Day 6: 1 tab at breakfast  From your responses in the eVisit questionnaire you describe inflammation in the upper respiratory tract which is causing a significant cough.  This is commonly called Bronchitis and has four common causes:   Allergies Viral Infections Acid Reflux Bacterial Infection Allergies, viruses and acid reflux are treated by controlling symptoms or eliminating the cause. An example might be a cough caused by taking certain blood pressure medications. You stop the cough by changing the medication. Another example might be a cough caused by acid reflux. Controlling the reflux helps control the cough.  USE OF BRONCHODILATOR (RESCUE) INHALERS: There is a risk from using your bronchodilator too frequently.  The risk is that over-reliance on a medication which only relaxes the muscles surrounding the breathing tubes can reduce the effectiveness of medications prescribed to reduce swelling and congestion of the tubes themselves.  Although you feel brief relief from the bronchodilator inhaler, your asthma may actually be worsening  with the tubes becoming more swollen and filled with mucus.  This can delay other crucial treatments, such as oral steroid medications. If you need to use a bronchodilator inhaler daily, several times per day, you should discuss this with your provider.  There are probably better treatments that could be used to keep your asthma under control.     HOME CARE Only take medications as instructed by your medical team. Complete the entire course of an antibiotic. Drink plenty of fluids and get plenty of rest. Avoid close contacts especially the very young and the elderly Cover your mouth if you cough or cough into your sleeve. Always remember to wash your hands A steam or ultrasonic humidifier can help congestion.   GET HELP RIGHT AWAY IF: You develop worsening fever. You become short of breath You cough up blood. Your symptoms persist after you have completed your treatment plan MAKE SURE YOU  Understand these instructions. Will watch your condition. Will get help right away if you are not doing well or get worse.  Your e-visit answers were reviewed by a board certified advanced clinical practitioner to complete your personal care plan.  Depending on the condition, your plan could have included both over the counter or prescription medications. If there is a problem please reply  once you have received a response from your provider. Your safety is important to us .  If you have drug allergies check your prescription carefully.    You can use  MyChart to ask questions about today's visit, request a non-urgent call back, or ask for a work or school excuse for 24 hours related to this e-Visit. If it has been greater than 24 hours you will need to follow up with your provider, or enter a new e-Visit to address those concerns. You will get an e-mail in the next two days asking about your experience.  I hope that your e-visit has been valuable and will speed your recovery. Thank you for using  e-visits.   I have spent 5 minutes in review of e-visit questionnaire, review and updating patient chart, medical decision making and response to patient.   Roosvelt Mater, PA-C

## 2024-06-27 ENCOUNTER — Other Ambulatory Visit: Payer: Self-pay

## 2024-06-29 ENCOUNTER — Ambulatory Visit
Admission: EM | Admit: 2024-06-29 | Discharge: 2024-06-29 | Disposition: A | Discharge status: Home / Self Care | Attending: Nurse Practitioner | Admitting: Nurse Practitioner

## 2024-06-29 DIAGNOSIS — R059 Cough, unspecified: Secondary | ICD-10-CM | POA: Diagnosis not present

## 2024-06-29 DIAGNOSIS — J4551 Severe persistent asthma with (acute) exacerbation: Secondary | ICD-10-CM

## 2024-06-29 MED ORDER — HYDROCODONE BIT-HOMATROP MBR 5-1.5 MG/5ML PO SOLN: 5.0000 mL | Freq: Four times a day (QID) | ORAL | 0 refills | Status: DC | PRN

## 2024-06-29 MED ORDER — METHYLPREDNISOLONE SODIUM SUCC 125 MG IJ SOLR
125.0000 mg | Freq: Once | INTRAMUSCULAR | Status: AC
  Administered 2024-06-29: 125 mg via INTRAMUSCULAR

## 2024-06-29 MED ORDER — IPRATROPIUM-ALBUTEROL 0.5-2.5 (3) MG/3ML IN SOLN
3.0000 mL | Freq: Once | RESPIRATORY_TRACT | Status: AC
  Administered 2024-06-29: 3 mL via RESPIRATORY_TRACT

## 2024-06-29 MED ORDER — PREDNISONE 50 MG PO TABS: ORAL_TABLET | ORAL | 0 refills | Status: DC

## 2024-06-29 NOTE — ED Provider Notes (Signed)
 [facility]    CSN: [id number] Arrival date & time: 06/29/24  1815      History   Chief Complaint No chief complaint on file.   HPI Tammy Chapman is a 33 y.o. female.   The history is provided by the patient.   Patient presents for complaints of cough, chest discomfort, and wheezing.  Patient states the cough started approximately 3 weeks ago.  States that she started out with upper respiratory symptoms to include sinus drainage and headache.  She states those symptoms have improved, but the cough has persisted with worsening over the past 24 to 48 hours.  Patient completed an e-visit on 06/25/2024.  States that she did not get any relief from the medications prescribed to include prednisone  and benzonatate .  States that she has also been taking several over-the-counter medications with minimal relief of her symptoms.  Patient reports history of asthma, per review of her chart asthma is documented as severe, persistent.  States that she has been using her inhalers and nebulizer treatments at home with minimal relief.  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/2019   Chronic asthma 01/19/2019   Ehlers-Danlos disease    Gallbladder problem    IBS (irritable bowel syndrome)    Joint pain    Lactose intolerance    Loose body of right knee 02/2013   PCOS (polycystic ovarian syndrome)    Pregnancy induced hypertension    Recurrent otitis media of both ears 01/19/2019   SOBOE (shortness of breath on exertion)    Sprain and strain of medial collateral ligament of knee 02/2013   right    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   Abnormal Pap smear of cervix 12/29/2023   Presence of tympanostomy tube in tympanic membrane 10/16/2023   Sinobronchitis 06/16/2023   Abnormal food appetite 05/25/2023   AOM (acute otitis media)  04/29/2023   Vitamin D  deficiency 03/19/2023   SOBOE (shortness of breath on exertion) 03/04/2023   Pure hypercholesterolemia 03/04/2023   Class 2 obesity due to excess calories without serious comorbidity with body mass index (BMI) of 36.0 to 36.9 in adult 02/16/2023   Ehlers-Danlos disease 02/16/2023   PCOS (polycystic ovarian syndrome) 09/24/2021   Gastroesophageal reflux disease without esophagitis 02/27/2021   Allergic rhinitis 09/15/2019   Severe persistent asthma (HCC) 01/19/2019   Bronchitis, mucopurulent recurrent (HCC) 01/19/2019   Irritable bowel syndrome with diarrhea 08/04/2011    Past Surgical History:  Procedure Laterality Date   CESAREAN SECTION N/A 01/08/2017   Procedure: CESAREAN SECTION;  Surgeon: Edsel Norleen GAILS, MD;  Location: [facility];  Service: Obstetrics;  Laterality: N/A;   CESAREAN SECTION N/A 07/01/2019   Procedure: REPEAT CESAREAN SECTION;  Surgeon: Edsel Norleen GAILS, MD;  Location: [facility] LD ORS;  Service: Obstetrics;  Laterality: N/A;   CHOLECYSTECTOMY  01/20/2008   EAR TUBE REMOVAL  2002   ESOPHAGOGASTRODUODENOSCOPY  05/15/2011   Procedure: ESOPHAGOGASTRODUODENOSCOPY (EGD);  Surgeon: Claudis RAYMOND Rivet, MD;  Location: [facility]O [facility];  Service: Endoscopy;  Laterality: N/A;  3:15    KNEE ARTHROSCOPY Right 03/15/2013   Procedure: RIGHT  KNEE ARTHROSCOPY, FEMORAL PATELLA REEFING, LATERAL RELEASE, PARTIAL LATERAL MENISCECTOMY ;  Surgeon: Lamar DELENA Millman, MD;  Location: [facility];  Service: Orthopedics;  Laterality: Right;   TONSILLECTOMY AND ADENOIDECTOMY  1999   TYMPANOSTOMY TUBE PLACEMENT  1999, 2011    OB History     Gravida  2   Para  2   Term  2   Preterm      AB      Living  2      SAB      IAB      Ectopic      Multiple      Live Births  2            Home Medications    Prior to Admission medications   Medication Sig Start Date End Date Taking? Authorizing Provider  HYDROcodone  bit-homatropine (HYCODAN)  5-1.5 MG/5ML syrup Take 5 mLs by mouth every 6 (six) hours as needed for cough. 06/29/24  Yes Leath-Warren, Etta PARAS, NP  predniSONE  (DELTASONE ) 50 MG tablet Take 1 tablet daily with breakfast for the next 5 days. 06/29/24  Yes Leath-Warren, Etta PARAS, NP  albuterol  (PROVENTIL ) (2.5 MG/3ML) 0.083% nebulizer solution Inhale 1 vial (2.5 mg total) by nebulization every 6 (six) hours as needed for wheezing or shortness of breath. 03/31/24   Cobb, Comer GAILS, NP  Albuterol -Budesonide  (AIRSUPRA ) 90-80 MCG/ACT AERO Inhale 2 puffs into the lungs 4 (four) times daily as needed. 03/31/24   Cobb, Comer GAILS, NP  aspirin EC 81 MG tablet Take 81 mg by mouth daily. Swallow whole.    [provider]  benzonatate  (TESSALON ) 100 MG capsule Take 1 capsule (100 mg total) by mouth 3 (three) times daily as needed for up to 7 days. 06/25/24 07/02/24  Reyes, Shaylo, PA-C  budesonide -formoterol  (SYMBICORT ) 160-4.5 MCG/ACT inhaler Inhale 2 puffs into the lungs 2 (two) times daily. 03/31/24   Cobb, Comer GAILS, NP  doxycycline  (VIBRA -TABS) 100 MG tablet Take 1 tablet (100 mg total) by mouth 2 (two) times daily. Patient not taking: Reported on 06/02/2024 05/04/24   Vivienne Delon HERO, PA-C  fluticasone  (FLONASE ) 50 MCG/ACT nasal spray Place 2 sprays into both nostrils daily. Patient not taking: Reported on 06/02/2024 04/15/24   Vivienne Delon HERO, PA-C  levocetirizine (XYZAL ) 5 MG tablet TAKE 1 TABLET BY MOUTH EVERY EVENING 03/31/24   Cobb, Comer GAILS, NP  medroxyPROGESTERone  (PROVERA ) 10 MG tablet Take 1 tablet (10 mg total) by mouth daily. Every month if no spontaneous period 12/23/23   Kizzie Suzen SAUNDERS, CNM  montelukast  (SINGULAIR ) 10 MG tablet TAKE 1 TABLET BY MOUTH AT BEDTIME 03/31/24   Cobb, Comer GAILS, NP  Tezepelumab -ekko (TEZSPIRE ) 210 MG/1. SOAJ Inject 210 mg into the skin every 28 (twenty-eight) days. 06/06/24   Jegede, Olugbemiga E, MD  Tiotropium Bromide  (SPIRIVA  RESPIMAT) 1.25 MCG/ACT AERS Inhale 2  puffs into the lungs daily. 03/31/24   Cobb, Comer GAILS, NP    Family History Family History  Problem Relation Age of Onset   Diabetes Mother    Depression Mother    Anxiety disorder Mother    Obesity Mother    Obesity Father    Anxiety disorder Father    Depression Father    Heart disease Father    Hypertension Father    Celiac disease Father    Kidney disease Father    Alcoholism Father    Ulcerative colitis Maternal Grandmother    Heart disease Maternal Grandmother    Diabetes Maternal Grandfather    COPD Maternal Grandfather    Congestive Heart Failure Maternal Grandfather    Kidney disease Maternal Grandfather    Heart disease Paternal Grandmother    Renal cancer  Paternal Grandfather    Heart disease Paternal Grandfather    Other Daughter        milk and soy allergy   Kidney disease Maternal Uncle    Colon cancer Neg Hx    Esophageal cancer Neg Hx    Stomach cancer Neg Hx    Rectal cancer Neg Hx     Social History Social History   Tobacco Use   Smoking status: Never   Smokeless tobacco: Never  Vaping Use   Vaping status: Never Used  Substance Use Topics   Alcohol use: No   Drug use: No     Allergies   Benadryl  [diphenhydramine  hcl], Oxycodone -acetaminophen , Penicillins, Adhesive [tape], Lactose intolerance (gi), Latex, Omnipaque  [iohexol ], Sulfa antibiotics, and Ultram [tramadol hcl]   Review of Systems Review of Systems Per HPI  Physical Exam Triage Vital Signs ED Triage Vitals  Encounter Vitals Group     BP 06/29/24 1821 129/83     Girls Systolic BP Percentile --      Girls Diastolic BP Percentile --      Boys Systolic BP Percentile --      Boys Diastolic BP Percentile --      Pulse Rate 06/29/24 1821 85     Resp 06/29/24 1821 18     Temp 06/29/24 1821 98.8 F (37.1 C)     Temp Source 06/29/24 1821 Oral     SpO2 06/29/24 1821 96 %     Weight --      Height --      Head Circumference --      Peak Flow --      Pain Score 06/29/24 1824  0     Pain Loc --      Pain Education --      Exclude from Growth Chart --    No data found.  Updated Vital Signs BP 129/83 (BP Location: Right Arm)   Pulse 85   Temp 98.8 F (37.1 C) (Oral)   Resp 18   LMP 06/24/2024 (Exact Date)   SpO2 96%   Visual Acuity Right Eye Distance:   Left Eye Distance:   Bilateral Distance:    Right Eye Near:   Left Eye Near:    Bilateral Near:     Physical Exam Vitals and nursing note reviewed.  Constitutional:      General: She is not in acute distress.    Appearance: Normal appearance.  HENT:     Head: Normocephalic.     Right Ear: Tympanic membrane, ear canal and external ear normal.     Left Ear: Tympanic membrane, ear canal and external ear normal.     Nose: Nose normal.     Mouth/Throat:     Mouth: Mucous membranes are moist.     Pharynx: Posterior oropharyngeal erythema present.  Eyes:     Extraocular Movements: Extraocular movements intact.     Conjunctiva/sclera: Conjunctivae normal.     Pupils: Pupils are equal, round, and reactive to light.  Cardiovascular:     Rate and Rhythm: Normal rate and regular rhythm.     Pulses: Normal pulses.     Heart sounds: Normal heart sounds.  Pulmonary:     Effort: Prolonged expiration present. No tachypnea.     Breath sounds: No stridor. Examination of the right-upper field reveals wheezing. Examination of the left-upper field reveals wheezing. Examination of the right-lower field reveals wheezing. Examination of the left-lower field reveals wheezing. Wheezing present. No rhonchi.  Chest:  Chest wall: No tenderness.  Abdominal:     General: Bowel sounds are normal.     Palpations: Abdomen is soft.  Musculoskeletal:     Cervical back: Normal range of motion.  Skin:    General: Skin is warm and dry.  Neurological:     General: No focal deficit present.     Mental Status: She is alert and oriented to person, place, and time.  Psychiatric:        Mood and Affect: Mood normal.         Behavior: Behavior normal.      UC Treatments / Results  Labs (all labs ordered are listed, but only abnormal results are displayed) Labs Reviewed - No data to display  EKG   Radiology No results found.  Procedures Procedures (including critical care time)  Medications Ordered in UC Medications  ipratropium-albuterol  (DUONEB) 0.5-2.5 (3) MG/3ML nebulizer solution 3 mL (3 mLs Nebulization Given 06/29/24 1835)  methylPREDNISolone  sodium succinate (SOLU-MEDROL ) 125 mg/2 mL injection 125 mg (125 mg Intramuscular Given 06/29/24 1835)    Initial Impression / Assessment and Plan / UC Course  I have reviewed the triage vital signs and the nursing notes.  Pertinent labs & imaging results that were available during my care of the patient were reviewed by me and considered in my medical decision making (see chart for details).  Patient presents for complaints of cough that been present for the past 3 weeks.  On initial exam, patient with prolonged expirations and expiratory wheezing.  Cough has been persistent since she arrived.  DuoNeb and Solu-Medrol  125 mg IM administered.  Symptoms consistent with asthma exacerbation.  Patient has been using albuterol  nebulizer at home along with a Airsupra , Symbicort , Spiriva , and Tezspire .  She is also taking montelukast , Xyzal , and using Flonase .  Patient recently completed a course of steroids with minimal relief.  Do not suspect bacterial etiology at this time.  Will start another course of prednisone  50 mg for the next 5 days along with Hycodan cough syrup for patient to take at nighttime.  Supportive care recommendations were provided and discussed with the patient to include fluids, rest, over-the-counter analgesics, and use of a humidifier during sleep.  Discussed strict ER follow-up precautions with the patient to include worsening cough, shortness of breath, wheezing, or other concerns.  Patient was advised if symptoms fail to improve, it is  recommended that she follow-up with pulmonology for further evaluation.  Unable to perform imaging at this time due to staffing, patient was in agreement.  Patient verbalized understanding.  All questions were answered.  Patient stable for discharge.  Final Clinical Impressions(s) / UC Diagnoses   Final diagnoses:  Severe persistent asthma with acute exacerbation (HCC)  Cough, unspecified type     Discharge Instructions      You were given Solu-Medrol  125 mg and a duo nebulizer treatment. Start the prednisone  tomorrow.  Do not take the prednisone  with NSAIDs to include ibuprofen , Aleve , Advil , Motrin , or naproxen .  You may take Tylenol  as needed for breakthrough pain. Continue use of your current medications. Recommend use of a humidifier in your bedroom at nighttime during sleep and sleeping elevated on pillows while symptoms persist. Go to the emergency department if you experience worsening cough, wheezing, shortness of breath, difficulty breathing, or become unable to speak in a complete sentence. It is recommended that you follow-up with your pulmonologist if symptoms fail to improve. Follow-up as needed.     ED Prescriptions  Medication Sig Dispense Auth. Provider   predniSONE  (DELTASONE ) 50 MG tablet Take 1 tablet daily with breakfast for the next 5 days. 5 tablet Leath-Warren, Etta PARAS, NP   HYDROcodone  bit-homatropine (HYCODAN) 5-1.5 MG/5ML syrup Take 5 mLs by mouth every 6 (six) hours as needed for cough. 120 mL Leath-Warren, Etta PARAS, NP      PDMP not reviewed this encounter.   Gilmer Etta PARAS, NP 06/29/24 1915

## 2024-06-29 NOTE — Discharge Instructions (Addendum)
 You were given Solu-Medrol  125 mg and a duo nebulizer treatment. Start the prednisone  tomorrow.  Do not take the prednisone  with NSAIDs to include ibuprofen , Aleve , Advil , Motrin , or naproxen .  You may take Tylenol  as needed for breakthrough pain. Continue use of your current medications. Recommend use of a humidifier in your bedroom at nighttime during sleep and sleeping elevated on pillows while symptoms persist. Go to the emergency department if you experience worsening cough, wheezing, shortness of breath, difficulty breathing, or become unable to speak in a complete sentence. It is recommended that you follow-up with your pulmonologist if symptoms fail to improve. Follow-up as needed.

## 2024-06-29 NOTE — ED Triage Notes (Signed)
 Pt reports cough x 3 weeks, started out with sinus drainage, cough has gotten worse, headache and chest discomfort from constantly coughing.

## 2024-07-18 ENCOUNTER — Other Ambulatory Visit: Payer: Self-pay

## 2024-07-20 ENCOUNTER — Other Ambulatory Visit: Payer: Self-pay

## 2024-07-20 NOTE — Progress Notes (Signed)
 Specialty Pharmacy Refill Coordination Note  Tammy Chapman is a 33 y.o. female contacted today regarding refills of specialty medication(s) Tezepelumab -ekko (Tezspire )   Patient requested Delivery   Delivery date: 07/29/24   Verified address: [address], [place], [address]   Medication will be filled on: 07/28/24

## 2024-07-21 ENCOUNTER — Other Ambulatory Visit (HOSPITAL_BASED_OUTPATIENT_CLINIC_OR_DEPARTMENT_OTHER): Payer: Self-pay

## 2024-07-28 ENCOUNTER — Other Ambulatory Visit: Payer: Self-pay

## 2024-08-17 ENCOUNTER — Other Ambulatory Visit: Payer: Self-pay

## 2024-08-17 ENCOUNTER — Other Ambulatory Visit (HOSPITAL_BASED_OUTPATIENT_CLINIC_OR_DEPARTMENT_OTHER): Payer: Self-pay

## 2024-08-17 ENCOUNTER — Other Ambulatory Visit (HOSPITAL_COMMUNITY): Payer: Self-pay

## 2024-08-17 NOTE — Progress Notes (Signed)
 Specialty Pharmacy Refill Coordination Note  Tammy Chapman is a 33 y.o. female contacted today regarding refills of specialty medication(s) Tezepelumab -ekko (Tezspire )   Patient requested Delivery   Delivery date: 08/24/24   Verified address: [address], [place] [address]   Medication will be filled on: 08/23/24

## 2024-08-23 ENCOUNTER — Other Ambulatory Visit: Payer: Self-pay

## 2024-09-13 ENCOUNTER — Other Ambulatory Visit: Payer: Self-pay | Admitting: Nurse Practitioner

## 2024-09-13 ENCOUNTER — Other Ambulatory Visit: Payer: Self-pay

## 2024-09-13 DIAGNOSIS — J4551 Severe persistent asthma with (acute) exacerbation: Secondary | ICD-10-CM

## 2024-09-14 ENCOUNTER — Other Ambulatory Visit: Payer: Self-pay

## 2024-09-15 ENCOUNTER — Other Ambulatory Visit: Payer: Self-pay | Admitting: Nurse Practitioner

## 2024-09-15 ENCOUNTER — Other Ambulatory Visit (HOSPITAL_BASED_OUTPATIENT_CLINIC_OR_DEPARTMENT_OTHER): Payer: Self-pay

## 2024-09-15 ENCOUNTER — Telehealth: Admitting: Nurse Practitioner

## 2024-09-15 ENCOUNTER — Other Ambulatory Visit: Payer: Self-pay

## 2024-09-15 DIAGNOSIS — J4551 Severe persistent asthma with (acute) exacerbation: Secondary | ICD-10-CM

## 2024-09-15 DIAGNOSIS — J019 Acute sinusitis, unspecified: Secondary | ICD-10-CM

## 2024-09-15 DIAGNOSIS — B9689 Other specified bacterial agents as the cause of diseases classified elsewhere: Secondary | ICD-10-CM

## 2024-09-15 MED ORDER — DOXYCYCLINE HYCLATE 100 MG PO TABS
100.0000 mg | ORAL_TABLET | Freq: Two times a day (BID) | ORAL | 0 refills | Status: AC
  Filled 2024-09-15: qty 10, 5d supply, fill #0
  Filled 2024-09-15 (×2): qty 4, 2d supply, fill #0

## 2024-09-15 MED ORDER — FLUTICASONE PROPIONATE 50 MCG/ACT NA SUSP
2.0000 | Freq: Every day | NASAL | 0 refills | Status: AC
  Filled 2024-09-15: qty 16, 30d supply, fill #0

## 2024-09-15 NOTE — Progress Notes (Signed)
"      E-Visit for Sinus Problems  We are sorry that you are not feeling well.  Here is how we plan to help!  Based on what you have shared with me it looks like you have sinusitis.  Sinusitis is inflammation and infection in the sinus cavities of the head.  Based on your presentation I believe you most likely have Acute Bacterial Sinusitis.  This is an infection caused by bacteria and is treated with antibiotics. I have prescribed Because of certain medication allergies/intolerances, I have prescribed Doxycycline  100mg  Take 1 tablet by mouth twice daily for 7 days and I have also prescribed Flonase  Nasal Spray Use 2 sprays in each nostril daily for 10-14 days You may use an oral decongestant such as Mucinex D or if you have glaucoma or high blood pressure use plain Mucinex. Saline nasal spray help and can safely be used as often as needed for congestion.  If you develop worsening sinus pain, fever or notice severe headache and vision changes, or if symptoms are not better after completion of antibiotic, please schedule an appointment with a health care provider.    Sinus infections are not as easily transmitted as other respiratory infection, however we still recommend that you avoid close contact with loved ones, especially the very young and elderly.  Remember to wash your hands thoroughly throughout the day as this is the number one way to prevent the spread of infection!  Home Care: Only take medications as instructed by your medical team. Complete the entire course of an antibiotic. Do not take these medications with alcohol. A steam or ultrasonic humidifier can help congestion.  You can place a towel over your head and breathe in the steam from hot water coming from a faucet. Avoid close contacts especially the very young and the elderly. Cover your mouth when you cough or sneeze. Always remember to wash your hands.  Get Help Right Away If: You develop worsening fever or sinus pain. You  develop a severe head ache or visual changes. Your symptoms persist after you have completed your treatment plan.  Make sure you Understand these instructions. Will watch your condition. Will get help right away if you are not doing well or get worse.  Your e-visit answers were reviewed by a board certified advanced clinical practitioner to complete your personal care plan.  Depending on the condition, your plan could have included both over the counter or prescription medications.  If there is a problem please reply  once you have received a response from your provider.  Your safety is important to us .  If you have drug allergies check your prescription carefully.    You can use MyChart to ask questions about todays visit, request a non-urgent call back, or ask for a work or school excuse for 24 hours related to this e-Visit. If it has been greater than 24 hours you will need to follow up with your provider, or enter a new e-Visit to address those concerns.  You will get an e-mail in the next two days asking about your experience.  I hope that your e-visit has been valuable and will speed your recovery. Thank you for using e-visits.  I have spent 5 minutes in review of e-visit questionnaire, review and updating patient chart, medical decision making and response to patient.   Hadassah Fireman, NP     "

## 2024-09-16 ENCOUNTER — Other Ambulatory Visit: Payer: Self-pay

## 2024-09-19 ENCOUNTER — Other Ambulatory Visit (HOSPITAL_COMMUNITY): Payer: Self-pay

## 2024-09-20 NOTE — Telephone Encounter (Signed)
 Due for MTDM visit for renewal of Tezspire . ATC patient, LVMTCB.

## 2024-09-21 ENCOUNTER — Other Ambulatory Visit: Payer: Self-pay

## 2024-09-21 ENCOUNTER — Encounter (INDEPENDENT_AMBULATORY_CARE_PROVIDER_SITE_OTHER): Payer: Self-pay

## 2024-09-21 ENCOUNTER — Other Ambulatory Visit (HOSPITAL_BASED_OUTPATIENT_CLINIC_OR_DEPARTMENT_OTHER): Payer: Self-pay

## 2024-09-22 ENCOUNTER — Other Ambulatory Visit: Payer: Self-pay

## 2024-09-22 NOTE — Telephone Encounter (Signed)
 Second ATC patient. LVMTCB.

## 2024-09-23 ENCOUNTER — Ambulatory Visit

## 2024-09-23 ENCOUNTER — Other Ambulatory Visit: Payer: Self-pay

## 2024-09-23 DIAGNOSIS — J4550 Severe persistent asthma, uncomplicated: Secondary | ICD-10-CM

## 2024-09-23 DIAGNOSIS — J4551 Severe persistent asthma with (acute) exacerbation: Secondary | ICD-10-CM

## 2024-09-23 MED ORDER — TEZSPIRE 210 MG/1.91ML ~~LOC~~ SOAJ
210.0000 mg | SUBCUTANEOUS | 2 refills | Status: AC
  Filled 2024-09-23 (×2): qty 1.91, 28d supply, fill #0

## 2024-09-23 NOTE — Progress Notes (Signed)
 [facility]  Referring Provider: Dr. Shelah  Virtual Visit via Telephone Note  I connected with Tammy Chapman on 09/23/24 at 11:40 AM EST by telephone and verified that I am speaking with the correct person using two identifiers.  Location: Patient: home Provider: office   I discussed the limitations, risks, security and privacy concerns of performing an evaluation and management service by telephone and the availability of in person appointments. I also discussed with the patient that there may be a patient responsible charge related to this service. The patient expressed understanding and agreed to proceed.   HPI: Tammy Chapman is a 34 y.o. female who presents to the pharmacotherapy clinic via telephone for follow-up Tezspire  injection counseling.  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   Abnormal Pap smear of cervix 12/29/2023   Presence of tympanostomy tube in tympanic membrane 10/16/2023   Sinobronchitis 06/16/2023   Abnormal food appetite 05/25/2023   AOM (acute otitis media) 04/29/2023   Vitamin D  deficiency 03/19/2023   SOBOE (shortness of breath on exertion) 03/04/2023   Pure hypercholesterolemia 03/04/2023   Class 2 obesity due to excess calories without serious comorbidity with body mass index (BMI) of 36.0 to 36.9 in adult 02/16/2023   Ehlers-Danlos disease 02/16/2023   PCOS (polycystic ovarian syndrome) 09/24/2021   Gastroesophageal reflux disease without esophagitis 02/27/2021   Allergic rhinitis 09/15/2019   Severe persistent asthma (HCC) 01/19/2019   Bronchitis, mucopurulent recurrent (HCC) 01/19/2019   Irritable bowel syndrome with diarrhea 08/04/2011    Patient's Medications  New Prescriptions   No medications on file  Previous Medications   ALBUTEROL  (PROVENTIL ) (2.5 MG/3ML) 0.083% NEBULIZER SOLUTION    Inhale 1 vial (2.5 mg total) by nebulization every 6 (six) hours as needed for  wheezing or shortness of breath.   ALBUTEROL -BUDESONIDE  (AIRSUPRA ) 90-80 MCG/ACT AERO    Inhale 2 puffs into the lungs 4 (four) times daily as needed.   ASPIRIN EC 81 MG TABLET    Take 81 mg by mouth daily. Swallow whole.   BUDESONIDE -FORMOTEROL  (SYMBICORT ) 160-4.5 MCG/ACT INHALER    Inhale 2 puffs into the lungs 2 (two) times daily.   FLUTICASONE  (FLONASE ) 50 MCG/ACT NASAL SPRAY    Place 2 sprays into both nostrils daily.   LEVOCETIRIZINE (XYZAL ) 5 MG TABLET    Take 1 tablet (5 mg total) by mouth every evening.   MONTELUKAST  (SINGULAIR ) 10 MG TABLET    Take 1 tablet (10 mg total) by mouth at bedtime.   TEZEPELUMAB -EKKO (TEZSPIRE ) 210 MG/1. SOAJ    Inject 210 mg into the skin every 28 (twenty-eight) days.   TIOTROPIUM BROMIDE  (SPIRIVA  RESPIMAT) 1.25 MCG/ACT AERS    Inhale 2 puffs into the lungs daily.  Modified Medications   No medications on file  Discontinued Medications   No medications on file    Allergies: Allergies[1]  Past Medical History: 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/2019   Chronic asthma 01/19/2019   Ehlers-Danlos disease    Gallbladder problem    IBS (irritable bowel syndrome)    Joint pain    Lactose intolerance    Loose body of right knee 02/2013   PCOS (polycystic ovarian syndrome)    Pregnancy induced hypertension    Recurrent otitis media of both ears 01/19/2019   SOBOE (shortness of breath on exertion)  Sprain and strain of medial collateral ligament of knee 02/2013   right    Social History: Social History   Socioeconomic History   Marital status: Married    Spouse name: G. Wayne   Number of children: 2   Years of education: Not on file   Highest education level: Not on file  Occupational History   Occupation: Patient Care Coordinator 2 at [employer]  Tobacco Use   Smoking status: Never   Smokeless tobacco: Never  Vaping Use    Vaping status: Never Used  Substance and Sexual Activity   Alcohol use: No   Drug use: No   Sexual activity: Yes    Partners: Male    Birth control/protection: None  Other Topics Concern   Not on file  Social History Narrative   Not on file   Social Drivers of Health   Tobacco Use: Low Risk (06/29/2024)   Patient History    Smoking Tobacco Use: Never    Smokeless Tobacco Use: Never    Passive Exposure: Not on file  Financial Resource Strain: Medium Risk (12/23/2023)   Overall Financial Resource Strain (CARDIA)    Difficulty of Paying Living Expenses: Somewhat hard  Food Insecurity: No Food Insecurity (12/23/2023)   Hunger Vital Sign    Worried About Running Out of Food in the Last Year: Never true    Ran Out of Food in the Last Year: Never true  Transportation Needs: No Transportation Needs (12/23/2023)   PRAPARE - Administrator, Civil Service (Medical): No    Lack of Transportation (Non-Medical): No  Physical Activity: Sufficiently Active (12/23/2023)   Exercise Vital Sign    Days of Exercise per Week: 3 days    Minutes of Exercise per Session: 100 min  Stress: No Stress Concern Present (12/23/2023)   [employer] of Occupational Health - Occupational Stress Questionnaire    Feeling of Stress : Not at all  Social Connections: Moderately Integrated (12/23/2023)   Social Connection and Isolation Panel    Frequency of Communication with Friends and Family: Three times a week    Frequency of Social Gatherings with Friends and Family: Once a week    Attends Religious Services: More than 4 times per year    Active Member of Clubs or Organizations: No    Attends Banker Meetings: Never    Marital Status: Married  Depression (PHQ2-9): Low Risk (01/20/2024)   Depression (PHQ2-9)    PHQ-2 Score: 0  Alcohol Screen: Low Risk (12/23/2023)   Alcohol Screen    Last Alcohol Screening Score (AUDIT): 0  Housing: Low Risk (12/23/2023)   Housing Stability Vital Sign     Unable to Pay for Housing in the Last Year: No    Number of Times Moved in the Last Year: 0    Homeless in the Last Year: No  Utilities: Not At Risk (12/23/2023)   AHC Utilities    Threatened with loss of utilities: No  Health Literacy: Adequate Health Literacy (12/23/2023)   B1300 Health Literacy    Frequency of need for help with medical instructions: Never    Medication: Tezspire   autoinjector pen  Biologics training for tezepulumab (Tezspire )  Goals of therapy: Mechanism: 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 This blocks TSLP from its effect on inflammation including reduce eosinophils, IgE, FeNO, IL-5, and IL-13. Mechanism is not definitively established. Reviewed that Tezspire  is add-on medication and patient must continue maintenance inhaler regimen. Response to therapy: may  take 3-4 months to determine efficacy.  Side effects: injection site reaction (6-18%), antibody development (2%), arthralgia (4%), back pain (4%), pharyngitis (4%)  Dose: Tezspire  210 mg once every 4 weeks  Administration/Storage:  Reviewed administration sites of thigh or abdomen (at least 2-3 inches away from abdomen). Reviewed the upper arm is only appropriate if caregiver is administering injection  Do not shake pen/syringe as this could lead to product foaming or precipitation. Do not shake syringe as this could lead to product foaming or precipitation.  Medication Reconciliation  A drug regimen assessment was performed, including review of allergies, interactions, disease-state management, dosing and immunization history. Medications were reviewed with the patient, including name, instructions, indication, goals of therapy, potential side effects, importance of adherence, and safe use.  Plan: - CONTINUE Tezspire  210mg  [place] every 28 days  - Rx triaged to [facility].  - Follow-up with Dr. Shelah in April as recommended  I discussed the assessment and treatment plan with the patient. The  patient was provided an opportunity to ask questions and all were answered. The patient agreed with the plan and demonstrated an understanding of the instructions.   The patient was advised to call back or seek an in-person evaluation if the symptoms worsen or if the condition fails to improve as anticipated.  I provided 10 minutes of non-face-to-face time during this encounter.  Aleck Puls, PharmD, BCPS, CPP Clinical Pharmacist  [facility]  [facility] 09/23/2024, 11:42 AM      [1]  Allergies Allergen Reactions   Benadryl  [Diphenhydramine  Hcl] Shortness Of Breath    Pt states that she feels fine if she sits down, but if she is moving around after taking benadryl , she feels SOB   Oxycodone -Acetaminophen  Hives   Penicillins Shortness Of Breath    Has patient had a PCN reaction causing immediate rash, facial/tongue/throat swelling, SOB or lightheadedness with hypotension: yes Has patient had a PCN reaction causing severe rash involving mucus membranes or skin necrosis: no Has patient had a PCN reaction that required hospitalization: no Has patient had a PCN reaction occurring within the last 10 years: no If all of the above answers are NO, then may proceed with Cephalosporin use. Patient reports she CAN TAKE kEFLEX  AND SUPRAX    Adhesive [Tape] Hives   Lactose Intolerance (Gi) Diarrhea and Nausea And Vomiting   Latex Rash   Omnipaque  [Iohexol ] Itching    Slight itching in mouth and tongue. No swelling, wheezing, or hives.   Sulfa Antibiotics Rash   Ultram [Tramadol Hcl] Rash

## 2024-09-23 NOTE — Addendum Note (Signed)
 Addended by: Soua Lenk L on: 09/23/2024 11:40 AM   Modules accepted: Orders

## 2024-09-23 NOTE — Progress Notes (Signed)
 Specialty Pharmacy Refill Coordination Note  Tammy Chapman is a 34 y.o. female contacted today regarding refills of specialty medication(s) Tezepelumab -ekko (Tezspire )   Patient requested No data recorded  Delivery date: 09/27/24   Verified address: No data recorded  Medication will be filled on: 09/26/24
